# Patient Record
Sex: Male | Born: 1950
Health system: Southern US, Community
[De-identification: ages and names within clinical notes are randomized; demographics above are authoritative.]

## PROBLEM LIST (undated history)

## (undated) DIAGNOSIS — R51 Headache: Secondary | ICD-10-CM

## (undated) DIAGNOSIS — I2510 Atherosclerotic heart disease of native coronary artery without angina pectoris: Secondary | ICD-10-CM

## (undated) DIAGNOSIS — F1010 Alcohol abuse, uncomplicated: Secondary | ICD-10-CM

## (undated) DIAGNOSIS — I639 Cerebral infarction, unspecified: Secondary | ICD-10-CM

## (undated) DIAGNOSIS — K269 Duodenal ulcer, unspecified as acute or chronic, without hemorrhage or perforation: Secondary | ICD-10-CM

## (undated) DIAGNOSIS — B3781 Candidal esophagitis: Secondary | ICD-10-CM

## (undated) DIAGNOSIS — C61 Malignant neoplasm of prostate: Secondary | ICD-10-CM

## (undated) DIAGNOSIS — E43 Unspecified severe protein-calorie malnutrition: Secondary | ICD-10-CM

## (undated) DIAGNOSIS — I429 Cardiomyopathy, unspecified: Secondary | ICD-10-CM

## (undated) DIAGNOSIS — I10 Essential (primary) hypertension: Secondary | ICD-10-CM

## (undated) DIAGNOSIS — I509 Heart failure, unspecified: Secondary | ICD-10-CM

## (undated) HISTORY — DX: Heart failure, unspecified: I50.9

## (undated) HISTORY — DX: Cerebral infarction, unspecified: I63.9

## (undated) HISTORY — DX: Unspecified severe protein-calorie malnutrition: E43

## (undated) HISTORY — DX: Alcohol abuse, uncomplicated: F10.10

## (undated) HISTORY — DX: Duodenal ulcer, unspecified as acute or chronic, without hemorrhage or perforation: K26.9

## (undated) HISTORY — DX: Atherosclerotic heart disease of native coronary artery without angina pectoris: I25.10

## (undated) HISTORY — PX: LOOP RECORDER IMPLANT: SHX5954

## (undated) HISTORY — DX: Cardiomyopathy, unspecified: I42.9

## (undated) HISTORY — DX: Candidal esophagitis: B37.81

---
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
## 2003-09-17 ENCOUNTER — Emergency Department (HOSPITAL_COMMUNITY): Admission: EM | Admit: 2003-09-17 | Discharge: 2003-09-17 | Payer: Self-pay | Admitting: Emergency Medicine

## 2006-04-08 ENCOUNTER — Emergency Department (HOSPITAL_COMMUNITY): Admission: EM | Admit: 2006-04-08 | Discharge: 2006-04-08 | Payer: Self-pay | Admitting: Emergency Medicine

## 2007-04-09 ENCOUNTER — Emergency Department (HOSPITAL_COMMUNITY): Admission: EM | Admit: 2007-04-09 | Discharge: 2007-04-09 | Payer: Self-pay | Admitting: Emergency Medicine

## 2009-08-04 ENCOUNTER — Encounter: Admission: RE | Admit: 2009-08-04 | Discharge: 2009-08-04 | Payer: Self-pay | Admitting: Family Medicine

## 2009-08-07 ENCOUNTER — Ambulatory Visit (HOSPITAL_COMMUNITY): Admission: RE | Admit: 2009-08-07 | Discharge: 2009-08-07 | Payer: Self-pay | Admitting: Internal Medicine

## 2010-06-16 ENCOUNTER — Encounter: Payer: Self-pay | Admitting: Gastroenterology

## 2011-03-05 ENCOUNTER — Other Ambulatory Visit: Payer: Self-pay | Admitting: Otolaryngology

## 2011-03-11 ENCOUNTER — Ambulatory Visit
Admission: RE | Admit: 2011-03-11 | Discharge: 2011-03-11 | Disposition: A | Discharge status: Home / Self Care | Payer: No Typology Code available for payment source | Source: Ambulatory Visit | Attending: Otolaryngology | Admitting: Otolaryngology

## 2011-04-29 ENCOUNTER — Ambulatory Visit (HOSPITAL_COMMUNITY)
Admission: RE | Admit: 2011-04-29 | Discharge: 2011-04-29 | Disposition: A | Discharge status: Home / Self Care | Payer: Self-pay | Source: Ambulatory Visit | Attending: Gastroenterology | Admitting: Gastroenterology

## 2011-04-29 ENCOUNTER — Encounter (HOSPITAL_COMMUNITY)
Admission: RE | Disposition: A | Discharge status: Home / Self Care | Payer: Self-pay | Source: Ambulatory Visit | Attending: Gastroenterology

## 2011-04-29 ENCOUNTER — Encounter (HOSPITAL_COMMUNITY): Payer: Self-pay | Admitting: *Deleted

## 2011-04-29 DIAGNOSIS — K224 Dyskinesia of esophagus: Secondary | ICD-10-CM | POA: Insufficient documentation

## 2011-04-29 DIAGNOSIS — K222 Esophageal obstruction: Secondary | ICD-10-CM | POA: Insufficient documentation

## 2011-04-29 DIAGNOSIS — K2210 Ulcer of esophagus without bleeding: Secondary | ICD-10-CM | POA: Insufficient documentation

## 2011-04-29 DIAGNOSIS — R1310 Dysphagia, unspecified: Secondary | ICD-10-CM | POA: Insufficient documentation

## 2011-04-29 HISTORY — PX: ESOPHAGOGASTRODUODENOSCOPY: SHX5428

## 2011-04-29 HISTORY — DX: Essential (primary) hypertension: I10

## 2011-04-29 SURGERY — EGD (ESOPHAGOGASTRODUODENOSCOPY)
Anesthesia: Moderate Sedation

## 2011-04-29 MED ORDER — MIDAZOLAM HCL 10 MG/2ML IJ SOLN
INTRAMUSCULAR | Status: AC
  Filled 2011-04-29: qty 2

## 2011-04-29 MED ORDER — BUTAMBEN-TETRACAINE-BENZOCAINE 2-2-14 % EX AERO
INHALATION_SPRAY | CUTANEOUS | Status: DC | PRN
  Administered 2011-04-29: 2 via TOPICAL

## 2011-04-29 MED ORDER — MIDAZOLAM HCL 10 MG/2ML IJ SOLN
INTRAMUSCULAR | Status: DC | PRN
  Administered 2011-04-29: 2 mg via INTRAVENOUS
  Administered 2011-04-29: 1 mg via INTRAVENOUS
  Administered 2011-04-29: 2 mg via INTRAVENOUS
  Administered 2011-04-29: 1 mg via INTRAVENOUS

## 2011-04-29 MED ORDER — SODIUM CHLORIDE 0.9 % IV SOLN
Freq: Once | INTRAVENOUS | Status: AC
  Administered 2011-04-29: 500 mL via INTRAVENOUS

## 2011-04-29 MED ORDER — FENTANYL NICU IV SYRINGE 50 MCG/ML
INJECTION | INTRAMUSCULAR | Status: DC | PRN
  Administered 2011-04-29 (×3): 25 ug via INTRAVENOUS

## 2011-04-29 MED ORDER — FENTANYL CITRATE 0.05 MG/ML IJ SOLN
INTRAMUSCULAR | Status: AC
  Filled 2011-04-29: qty 2

## 2011-04-29 NOTE — H&P (Signed)
H+P from office reviewed no changes.

## 2011-04-30 ENCOUNTER — Other Ambulatory Visit: Payer: Self-pay | Admitting: Gastroenterology

## 2011-05-01 ENCOUNTER — Ambulatory Visit
Admission: RE | Admit: 2011-05-01 | Discharge: 2011-05-01 | Disposition: A | Discharge status: Home / Self Care | Payer: No Typology Code available for payment source | Source: Ambulatory Visit | Attending: Gastroenterology | Admitting: Gastroenterology

## 2011-05-01 ENCOUNTER — Encounter (HOSPITAL_COMMUNITY): Payer: Self-pay | Admitting: Gastroenterology

## 2011-05-01 MED ORDER — IOHEXOL 300 MG/ML  SOLN
75.0000 mL | Freq: Once | INTRAMUSCULAR | Status: AC | PRN
  Administered 2011-05-01: 75 mL via INTRAVENOUS

## 2011-05-06 ENCOUNTER — Encounter (HOSPITAL_COMMUNITY): Payer: Self-pay

## 2011-05-06 NOTE — H&P (Signed)
PREOPERATIVE H&P  Chief Complaint: trouble swallowing  HPI: Gregory Perry is a 60 y.o. male who presents for evaluation of trouble swallowing for 6 months. He's has approximately 25 lb. Weight loss. He underwent endoscopy with Dr Randa Evens who noted an upper esophageal mass and was unable to pass the endoscope. Subsequent CT scan also demonstrated an upper esophageal mass just below the UES. He's taken to the OR at this time for rigid esophagoscopy and bx. Smokes 1/2 ppd.  Past Medical History  Diagnosis Date  . Hypertension   . Dysphagia   . Headache     history of   Past Surgical History  Procedure Date  . No past surgeries   . Esophagogastroduodenoscopy 04/29/2011    Procedure: ESOPHAGOGASTRODUODENOSCOPY (EGD);  Surgeon: Vertell Novak., MD;  Location: [facility];  Service: Endoscopy;  Laterality: N/A;    History reviewed. No pertinent family history. No Known Allergies Prior to Admission medications   Not on File     Positive ROS: trouble swalloing  All other systems have been reviewed and were otherwise negative with the exception of those mentioned in the HPI and as above.  Physical Exam: There were no vitals filed for this visit.  General: Alert, no acute distress Oral: Normal oral mucosa and tonsils Nasal: Clear nasal passages Neck: No palpable adenopathy or thyroid nodules Ear: Ear canal is clear with normal appearing TMs Cardiovascular: Regular rate and rhythm, no murmur.  Respiratory: Clear to auscultation Neurologic: Alert and oriented x 3   Assessment/Plan: ESOPHAGEAL MASS Plan for Procedure(s): DIRECT LARYNGOSCOPY, DIRECT ESOPHAGOSCOPY AND BX   NEWMAN, CHRISTOPHER E, MD 05/06/2011 8:46 PM

## 2011-05-07 ENCOUNTER — Ambulatory Visit (HOSPITAL_COMMUNITY): Payer: Self-pay

## 2011-05-07 ENCOUNTER — Ambulatory Visit (HOSPITAL_COMMUNITY)
Admission: RE | Admit: 2011-05-07 | Discharge: 2011-05-07 | Disposition: A | Discharge status: Home / Self Care | Payer: Self-pay | Source: Ambulatory Visit | Attending: Otolaryngology | Admitting: Otolaryngology

## 2011-05-07 ENCOUNTER — Encounter (HOSPITAL_COMMUNITY)
Admission: RE | Disposition: A | Discharge status: Home / Self Care | Payer: Self-pay | Source: Ambulatory Visit | Attending: Otolaryngology

## 2011-05-07 ENCOUNTER — Other Ambulatory Visit: Payer: Self-pay | Admitting: Otolaryngology

## 2011-05-07 ENCOUNTER — Ambulatory Visit (HOSPITAL_COMMUNITY): Payer: Self-pay | Admitting: Anesthesiology

## 2011-05-07 ENCOUNTER — Encounter (HOSPITAL_COMMUNITY): Payer: Self-pay | Admitting: Anesthesiology

## 2011-05-07 ENCOUNTER — Other Ambulatory Visit: Payer: Self-pay

## 2011-05-07 ENCOUNTER — Encounter (HOSPITAL_COMMUNITY): Payer: Self-pay | Admitting: *Deleted

## 2011-05-07 DIAGNOSIS — R634 Abnormal weight loss: Secondary | ICD-10-CM | POA: Insufficient documentation

## 2011-05-07 DIAGNOSIS — R1319 Other dysphagia: Secondary | ICD-10-CM | POA: Insufficient documentation

## 2011-05-07 DIAGNOSIS — R4702 Dysphasia: Secondary | ICD-10-CM | POA: Diagnosis present

## 2011-05-07 DIAGNOSIS — I10 Essential (primary) hypertension: Secondary | ICD-10-CM | POA: Insufficient documentation

## 2011-05-07 DIAGNOSIS — K222 Esophageal obstruction: Secondary | ICD-10-CM | POA: Insufficient documentation

## 2011-05-07 HISTORY — PX: DIRECT LARYNGOSCOPY: SHX5326

## 2011-05-07 HISTORY — DX: Headache: R51

## 2011-05-07 LAB — BASIC METABOLIC PANEL
BUN: 8 mg/dL (ref 6–23)
CO2: 25 mEq/L (ref 19–32)
Chloride: 103 mEq/L (ref 96–112)
GFR calc non Af Amer: >90 mL/min (ref >90)
Glucose, Bld: 72 mg/dL (ref 70–99)
Potassium: 3.7 mEq/L (ref 3.5–5.1)
Sodium: 140 mEq/L (ref 135–145)

## 2011-05-07 LAB — CBC
HCT: 41.6 % (ref 39.0–52.0)
Hemoglobin: 14.4 g/dL (ref 13.0–17.0)
MCH: 31.3 pg (ref 26.0–34.0)
MCHC: 34.6 g/dL (ref 30.0–36.0)
MCV: 90.4 fL (ref 78.0–100.0)
RBC: 4.6 MIL/uL (ref 4.22–5.81)

## 2011-05-07 SURGERY — LARYNGOSCOPY, DIRECT
Anesthesia: General | Site: Mouth | Wound class: Clean Contaminated

## 2011-05-07 MED ORDER — ROCURONIUM BROMIDE 100 MG/10ML IV SOLN
INTRAVENOUS | Status: DC | PRN
  Administered 2011-05-07: 35 mg via INTRAVENOUS

## 2011-05-07 MED ORDER — CEFAZOLIN SODIUM 1-5 GM-% IV SOLN
INTRAVENOUS | Status: AC
  Filled 2011-05-07: qty 100

## 2011-05-07 MED ORDER — GLYCOPYRROLATE 0.2 MG/ML IJ SOLN
INTRAMUSCULAR | Status: DC | PRN
  Administered 2011-05-07: 0.1 mg via INTRAVENOUS
  Administered 2011-05-07: .7 mg via INTRAVENOUS

## 2011-05-07 MED ORDER — ONDANSETRON HCL 4 MG/2ML IJ SOLN: 4.0000 mg | Freq: Four times a day (QID) | INTRAMUSCULAR | Status: DC | PRN

## 2011-05-07 MED ORDER — LACTATED RINGERS IV SOLN
INTRAVENOUS | Status: DC | PRN
  Administered 2011-05-07: 12:00:00 via INTRAVENOUS

## 2011-05-07 MED ORDER — AMOXICILLIN 400 MG/5ML PO SUSR: 400.0000 mg | Freq: Three times a day (TID) | ORAL | Status: AC

## 2011-05-07 MED ORDER — FENTANYL CITRATE 0.05 MG/ML IJ SOLN
INTRAMUSCULAR | Status: DC | PRN
  Administered 2011-05-07: 100 ug via INTRAVENOUS
  Administered 2011-05-07: 50 ug via INTRAVENOUS

## 2011-05-07 MED ORDER — CEFAZOLIN SODIUM 1-5 GM-% IV SOLN
INTRAVENOUS | Status: DC | PRN
  Administered 2011-05-07: 2 g via INTRAVENOUS

## 2011-05-07 MED ORDER — PROPOFOL 10 MG/ML IV EMUL
INTRAVENOUS | Status: DC | PRN
  Administered 2011-05-07: 250 mg via INTRAVENOUS
  Administered 2011-05-07: 50 mg via INTRAVENOUS

## 2011-05-07 MED ORDER — SUCRALFATE 1 GM/10ML PO SUSP: 1.0000 g | Freq: Four times a day (QID) | ORAL | Status: AC

## 2011-05-07 MED ORDER — SODIUM CHLORIDE 0.9 % IV SOLN: Freq: Once | INTRAVENOUS | Status: DC

## 2011-05-07 MED ORDER — ESMOLOL HCL 10 MG/ML IV SOLN
INTRAVENOUS | Status: DC | PRN
  Administered 2011-05-07 (×2): 40 mg via INTRAVENOUS

## 2011-05-07 MED ORDER — NEOSTIGMINE METHYLSULFATE 1 MG/ML IJ SOLN
INTRAMUSCULAR | Status: DC | PRN
  Administered 2011-05-07: 4 mg via INTRAVENOUS

## 2011-05-07 MED ORDER — ONDANSETRON HCL 4 MG/2ML IJ SOLN
INTRAMUSCULAR | Status: DC | PRN
  Administered 2011-05-07: 4 mg via INTRAVENOUS

## 2011-05-07 MED ORDER — HYDROMORPHONE HCL PF 1 MG/ML IJ SOLN: 0.2500 mg | INTRAMUSCULAR | Status: DC | PRN

## 2011-05-07 SURGICAL SUPPLY — 29 items
BALLN PULM 15 16.5 18X75 (BALLOONS)
BALLOON PULM 15 16.5 18X75 (BALLOONS) ×1 IMPLANT
CANISTER SUCTION 2500CC (MISCELLANEOUS) ×3 IMPLANT
CLOTH BEACON ORANGE TIMEOUT ST (SAFETY) ×2 IMPLANT
CONT SPEC 4OZ CLIKSEAL STRL BL (MISCELLANEOUS) ×2 IMPLANT
COVER TABLE BACK 60X90 (DRAPES) ×2 IMPLANT
DRAPE PROXIMA HALF (DRAPES) ×2 IMPLANT
GAUZE SPONGE 4X4 16PLY XRAY LF (GAUZE/BANDAGES/DRESSINGS) ×1 IMPLANT
GLOVE BIO SURGEON STRL SZ7.5 (GLOVE) ×1 IMPLANT
GLOVE SS BIOGEL STRL SZ 7.5 (GLOVE) ×1 IMPLANT
GLOVE SUPERSENSE BIOGEL SZ 7.5 (GLOVE) ×1
GLOVE SURG SS PI 6.5 STRL IVOR (GLOVE) ×1 IMPLANT
GUARD TEETH (MISCELLANEOUS) IMPLANT
KIT ROOM TURNOVER OR (KITS) ×2 IMPLANT
MARKER SKIN DUAL TIP RULER LAB (MISCELLANEOUS) IMPLANT
NS IRRIG 1000ML POUR BTL (IV SOLUTION) ×1 IMPLANT
PAD ARMBOARD 7.5X6 YLW CONV (MISCELLANEOUS) ×4 IMPLANT
PAD EYE OVAL STERILE LF (GAUZE/BANDAGES/DRESSINGS) ×8 IMPLANT
PATTIES SURGICAL .5 X3 (DISPOSABLE) IMPLANT
SOLUTION ANTI FOG 6CC (MISCELLANEOUS) ×1 IMPLANT
SPECIMEN JAR SMALL (MISCELLANEOUS) ×1 IMPLANT
SPONGE GAUZE 4X4 12PLY (GAUZE/BANDAGES/DRESSINGS) ×2 IMPLANT
SPONGE INTESTINAL PEANUT (DISPOSABLE) IMPLANT
SURGILUBE 2OZ TUBE FLIPTOP (MISCELLANEOUS) ×2 IMPLANT
SYR INFLATE BILIARY GAUGE (MISCELLANEOUS) ×1 IMPLANT
TOWEL OR 17X24 6PK STRL BLUE (TOWEL DISPOSABLE) ×5 IMPLANT
TRAP SPECIMEN MUCOUS 40CC (MISCELLANEOUS) IMPLANT
TUBE CONNECTING 12X1/4 (SUCTIONS) ×3 IMPLANT
WATER STERILE IRR 1000ML POUR (IV SOLUTION) ×1 IMPLANT

## 2011-05-07 NOTE — Op Note (Signed)
[name] [address] [place], [place]  [address]  ENDOSCOPY PROCEDURE REPORT  PATIENT:  Gregory, Perry  MR#:  [id number] BIRTHDATE:  08/04/1950, 60 yrs. old  GENDER:  male  ENDOSCOPIST:  Carman Ching Referred by:  Dillard Cannon, M.D.  PROCEDURE DATE:  05/07/2011 PROCEDURE:  EGD with biopsy, 43239, EGD with Savory dilation over a guidewire ASA CLASS: INDICATIONS:  patient with dysphagia and previous EGD showed prox esophageal stricture and CT patlulous proximal esophagus.  Pt for endoscopic and ENT evaluation under anesthesia and dilatation  MEDICATIONS:  General anesthesia  TOPICAL ANESTHETIC:  DESCRIPTION OF PROCEDURE:   After the risks benefits and alternatives of the procedure were thoroughly explained, informed consent was obtained.  The  endoscope was introduced through the mouth and advanced to the second portion of the duodenum, without limitations.  The instrument was slowly withdrawn as the mucosa was fully examined. There was proximal stricture ofthe esophagus about 2-3 cm below the UES and was unable to pass with the peds scope.  Savory wire was placed and dilated with 8,10, and 12 mm savory dilators. Complete EGD then performed. Some endoscopic biopsies were taken with peds forceps and Dr Ezzard Standing used rigid scope and obtained  larger biopsies. <<PROCEDUREIMAGES>> FINDINGS: Proximal Esophageal Stricture.  Biopsied and dilated to 12 mm.  ? benign vs malignan process.  COMPLICATIONS: None immediate  ENDOSCOPIC IMPRESSION: ? Benign Stricture of unclear cause.  RECOMMENDATIONS: Discharge oncarafate slurry and antibiotics.  F/U with DRs Randa Evens and [facility].  ? further dilatations depending  on path results.  ______________________________ Carman Ching  CC:  Dillard Cannon MD  n. Rosalie DoctorCarman Ching at 05/07/2011 02:10 PM  [name], [phone]

## 2011-05-07 NOTE — Preoperative (Signed)
Beta Blockers   Reason not to administer Beta Blockers:Not Applicable 

## 2011-05-07 NOTE — Anesthesia Procedure Notes (Signed)
Procedure Name: Intubation Date/Time: 05/07/2011 12:38 PM Performed by: Fuller Canada Pre-anesthesia Checklist: Patient identified, Timeout performed, Emergency Drugs available, Suction available and Patient being monitored Patient Re-evaluated:Patient Re-evaluated prior to inductionOxygen Delivery Method: Circle System Utilized Preoxygenation: Pre-oxygenation with 100% oxygen Intubation Type: IV induction Ventilation: Mask ventilation without difficulty Laryngoscope Size: Mac and 3 Grade View: Grade I Tube type: Oral Tube size: 6.5 mm Number of attempts: 1 Airway Equipment and Method: stylet Placement Confirmation: ETT inserted through vocal cords under direct vision,  positive ETCO2 and breath sounds checked- equal and bilateral Secured at: 22 cm Tube secured with: Tape Dental Injury: Teeth and Oropharynx as per pre-operative assessment

## 2011-05-07 NOTE — Anesthesia Postprocedure Evaluation (Signed)
Anesthesia Post Note  Patient: Gregory Perry  Procedure(s) Performed:  DIRECT LARYNGOSCOPY - ESOPHAGOSCOPY  WITH DILATION; ESOPHAGOGASTRODUODENOSCOPY (EGD) WITH ESOPHAGEAL DILATION  Anesthesia type: General  Patient location: PACU  Post pain: Pain level controlled and Adequate analgesia  Post assessment: Post-op Vital signs reviewed, Patient's Cardiovascular Status Stable, Respiratory Function Stable, Patent Airway and Pain level controlled  Last Vitals:  Filed Vitals:   05/07/11 1453  BP:   Pulse: 82  Temp: 36.6 C  Resp: 13    Post vital signs: Reviewed and stable  Level of consciousness: awake, alert  and oriented  Complications: No apparent anesthesia complications

## 2011-05-07 NOTE — Transfer of Care (Signed)
Immediate Anesthesia Transfer of Care Note  Patient: Gregory Perry  Procedure(s) Performed:  DIRECT LARYNGOSCOPY - ESOPHAGOSCOPY  WITH DILATION; ESOPHAGOGASTRODUODENOSCOPY (EGD) WITH ESOPHAGEAL DILATION  Patient Location: PACU  Anesthesia Type: General  Level of Consciousness: awake, alert  and oriented  Airway & Oxygen Therapy: Patient Spontanous Breathing and Patient connected to face mask oxygen  Post-op Assessment: Report given to PACU RN and Post -op Vital signs reviewed and stable  Post vital signs: Reviewed and stable  Complications: No apparent anesthesia complications

## 2011-05-07 NOTE — Brief Op Note (Signed)
05/07/2011  1:23 PM  PATIENT:  Gregory Perry  60 y.o. male  PRE-OPERATIVE DIAGNOSIS:  dysphagia  POST-OPERATIVE DIAGNOSIS:  dysphagia  PROCEDURE:  Procedure(s): DIRECT LARYNGOSCOPY ESOPHAGOGASTRODUODENOSCOPY (EGD) WITH ESOPHAGEAL DILATION  SURGEON:  Surgeon(s): Drema Halon, MD Vertell Novak., MD  PHYSICIAN ASSISTANT:   ASSISTANTS: none   ANESTHESIA:   general  EBL:     BLOOD ADMINISTERED:none  DRAINS: none   LOCAL MEDICATIONS USED:  NONE  SPECIMEN:  Source of Specimen:  upper esophagus 30cm  DISPOSITION OF SPECIMEN:  PATHOLOGY  COUNTS:  YES  TOURNIQUET:  * No tourniquets in log *  DICTATION: .Other Dictation: Dictation Number [id number]  PLAN OF CARE: Discharge to home after PACU  PATIENT DISPOSITION:  PACU - hemodynamically stable.   Delay start of Pharmacological VTE agent (>24hrs) due to surgical blood loss or risk of bleeding:  {YES/NO/NOT APPLICABLE:20182

## 2011-05-07 NOTE — Interval H&P Note (Signed)
History and Physical Interval Note:  05/07/2011 12:09 PM  Gregory Perry  has presented today for surgery, with the diagnosis of ESOPHAGEAL MASS  The various methods of treatment have been discussed with the patient and family. After consideration of risks, benefits and other options for treatment, the patient has consented to  Procedure(s): DIRECT LARYNGOSCOPY as a surgical intervention .  The patients' history has been reviewed, patient examined, no change in status, stable for surgery.  I have reviewed the patients' chart and labs.  Questions were answered to the patient's satisfaction.     [name] E

## 2011-05-07 NOTE — Anesthesia Preprocedure Evaluation (Addendum)
Anesthesia Evaluation  Patient identified by MRN, date of birth, ID band Patient awake    Reviewed: Allergy & Precautions, H&P , NPO status , Patient's Chart, lab work & pertinent test results  Airway Mallampati: II  Neck ROM: full    Dental   Pulmonary          Cardiovascular hypertension,     Neuro/Psych  Headaches,    GI/Hepatic   Endo/Other    Renal/GU      Musculoskeletal   Abdominal   Peds  Hematology   Anesthesia Other Findings   Reproductive/Obstetrics                          Anesthesia Physical Anesthesia Plan  ASA: II  Anesthesia Plan: General   Post-op Pain Management:    Induction: Intravenous  Airway Management Planned: Oral ETT  Additional Equipment:   Intra-op Plan:   Post-operative Plan: Extubation in OR  Informed Consent: I have reviewed the patients History and Physical, chart, labs and discussed the procedure including the risks, benefits and alternatives for the proposed anesthesia with the patient or authorized representative who has indicated his/her understanding and acceptance.     Plan Discussed with: CRNA, Surgeon and Anesthesiologist  Anesthesia Plan Comments:        Anesthesia Quick Evaluation

## 2011-05-07 NOTE — Op Note (Signed)
NAMETYMAR, POLYAK NO.:  [id number]  MEDICAL RECORD NO.:  [id number]  LOCATION:  [facility]                         FACILITY:  [facility]  PHYSICIAN:  Kristine Garbe. Ezzard Standing, M.D.DATE OF BIRTH:  01-13-1951  DATE OF PROCEDURE: DATE OF DISCHARGE:  05/07/2011                              OPERATIVE REPORT   PREOPERATIVE DIAGNOSIS:  Dysphagia with upper esophageal stricture.  POSTOPERATIVE DIAGNOSIS:  Dysphagia with upper esophageal stricture.  OPERATION:  Direct laryngoscopy, rigid esophagoscopy with dilation using the Savary dilators to number 12, biopsy of upper esophageal stricture region.  SURGEONS:  Kristine Garbe. Ezzard Standing, MD and Dr. Llana Aliment. Randa Evens, MD  ANESTHESIA:  General endotracheal.  COMPLICATIONS:  None.  BRIEF CLINICAL NOTE:  Jerret Mcbane is a 60 year old gentleman who has had problems with dysphagia now for about 6 months with a 25-pound weight loss.  He has had a CT scan and upper endoscopy, which showed a stricture with wall area questionable tumor.  He was taken to the operating room at this time for esophagoscopy with biopsy.  DESCRIPTION OF PROCEDURE:  After adequate endotracheal anesthesia, direct laryngoscopy was performed.  Both piriform sinuses and upper hypopharynx was all clear.  Larynx was clear.  The cervical esophagus was then used and the cervical esophagus was passed in the upper esophageal sphincter without any problem.  There was no abnormality noted in the mucosa but approximately 2-3 cm below the upper esophageal sphincter, there was a very rigid, tight area and some wall mucosa, which was bleeding a little bit.  Next, the guidewire was passed and dilation starting with 8 Savary dilator was dilated up to number 12 without any difficulty.  Then, Dr. Randa Evens passed the endoscope down to the esophagus down all the way into the stomach.  The remaining of more distal esophagus was clear.  Stomach was likewise clear of any  mucosal abnormalities.  Several biopsies were obtained around the strictured area.  This completed the procedure.  The patient was subsequently awoke from anesthesia and transferred recovery room postop doing well.  He received 2 g of Ancef postoperatively.  He will be discharged home on amoxicillin suspension and will follow up in 1 week for recheck and review of pathology.          ______________________________ Kristine Garbe Ezzard Standing, M.D.     CEN/MEDQ  D:  05/07/2011  T:  05/07/2011  Job:  [id number]  cc:   Fayrene Fearing L. Malon Kindle., M.D.

## 2011-05-10 ENCOUNTER — Encounter (HOSPITAL_COMMUNITY): Payer: Self-pay | Admitting: Otolaryngology

## 2012-09-11 IMAGING — CR DG CHEST 2V
2 series · 2 of 2 positions shown · non-contrast
Comparison: Chest x-ray 08/04/2009.

CLINICAL DATA: Esophageal mass.  Hypertension.

CHEST - 2 VIEW

[view not recorded (1 of 2)]
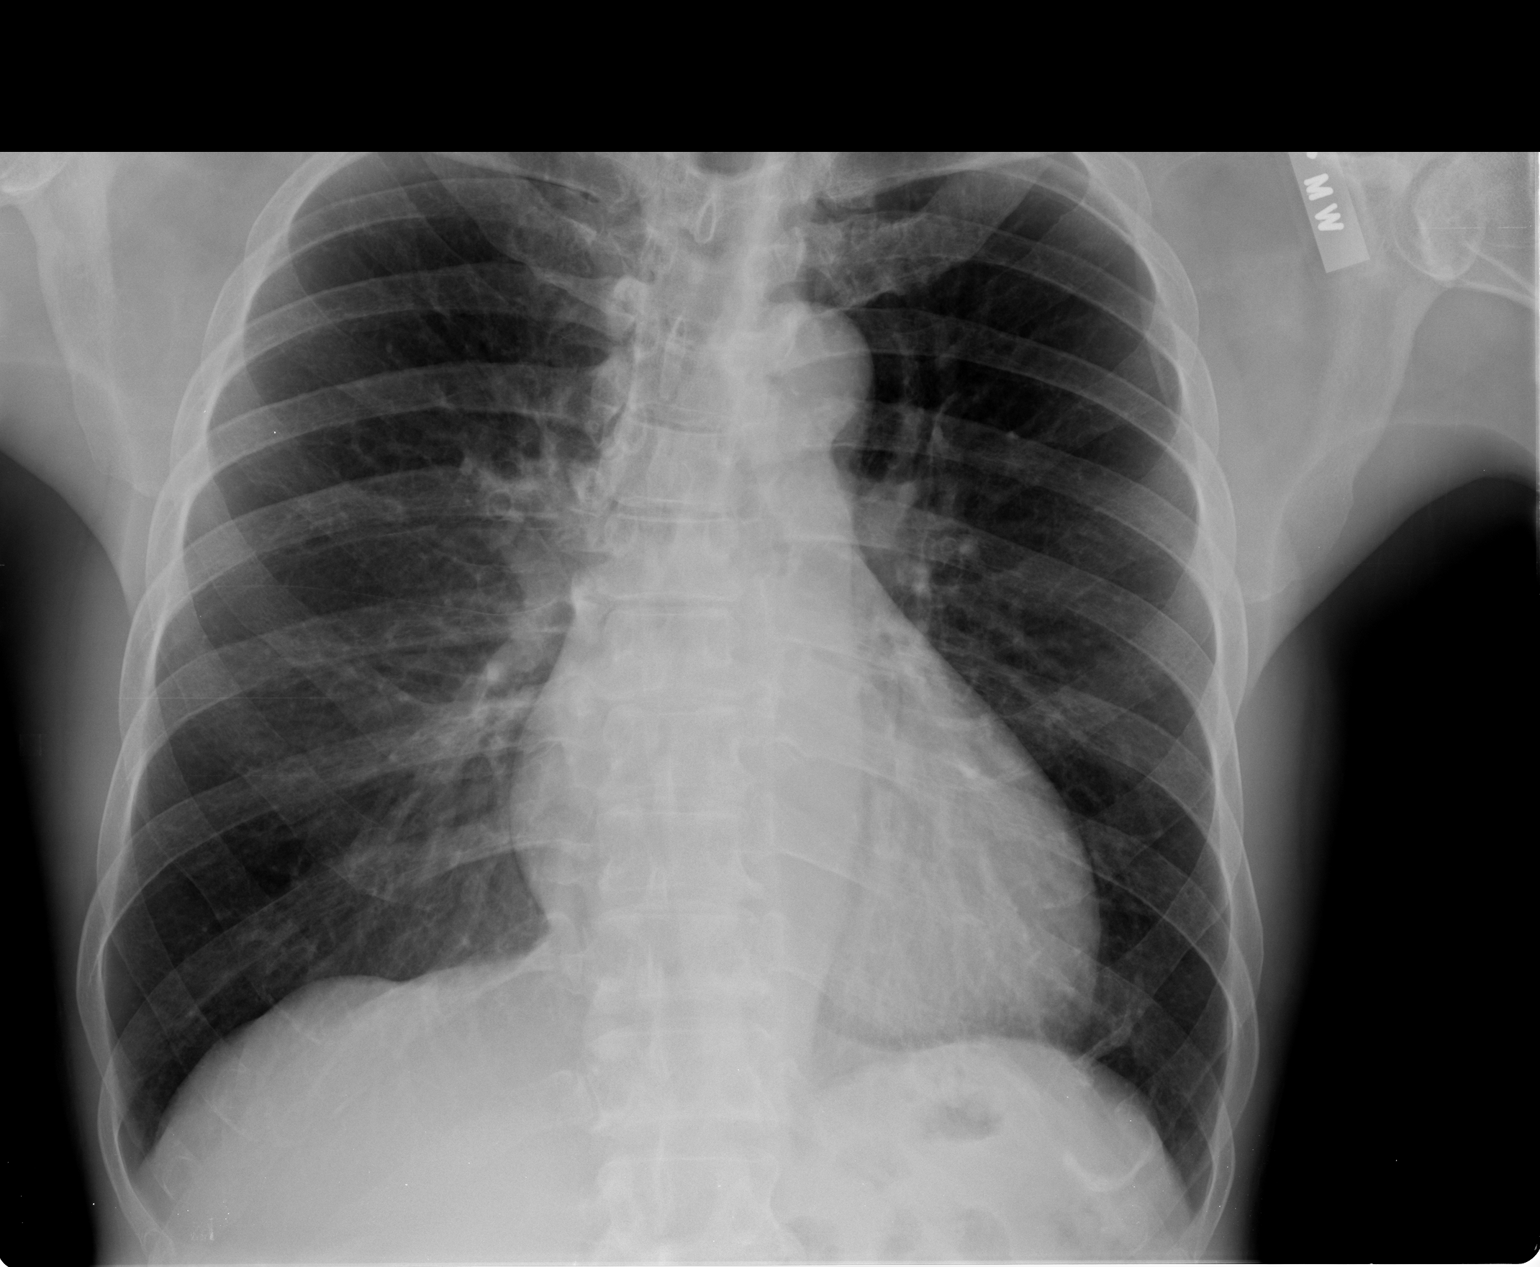

[view not recorded (2 of 2)]
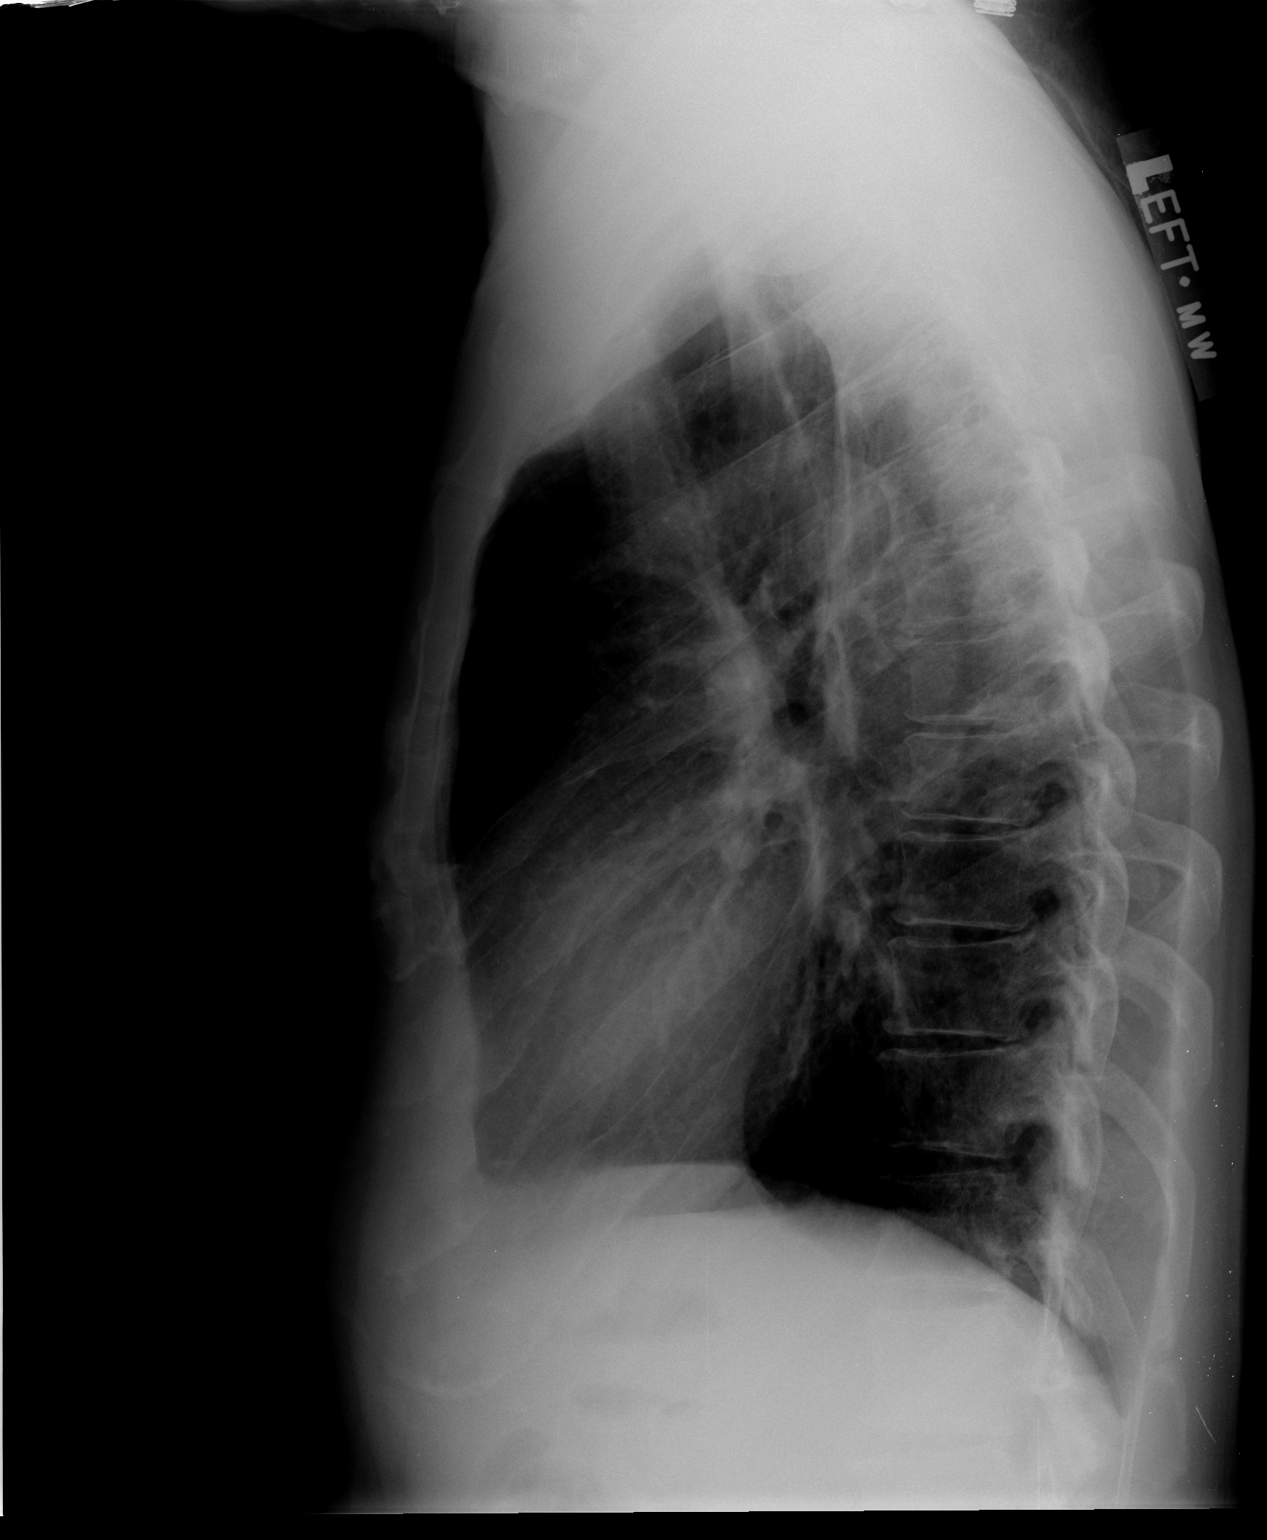

[2 of 2 positions shown; findings below may reference images not displayed]

FINDINGS: The cardiac silhouette, mediastinal and hilar contours
are stable.  The lungs are clear.  No pleural effusion.  The bony
thorax is intact.
IMPRESSION: No acute cardiopulmonary findings.  No change since prior study.

## 2015-11-16 ENCOUNTER — Encounter (HOSPITAL_COMMUNITY): Payer: Self-pay | Admitting: Nurse Practitioner

## 2015-11-16 ENCOUNTER — Inpatient Hospital Stay (HOSPITAL_COMMUNITY)
Admission: EM | Admit: 2015-11-16 | Discharge: 2015-11-23 | DRG: 040 | Disposition: A | Discharge status: Home / Self Care | Payer: Self-pay | Attending: Internal Medicine | Admitting: Internal Medicine

## 2015-11-16 ENCOUNTER — Other Ambulatory Visit: Payer: Self-pay

## 2015-11-16 DIAGNOSIS — I63331 Cerebral infarction due to thrombosis of right posterior cerebral artery: Secondary | ICD-10-CM | POA: Insufficient documentation

## 2015-11-16 DIAGNOSIS — Z01810 Encounter for preprocedural cardiovascular examination: Secondary | ICD-10-CM

## 2015-11-16 DIAGNOSIS — E785 Hyperlipidemia, unspecified: Secondary | ICD-10-CM | POA: Diagnosis present

## 2015-11-16 DIAGNOSIS — R1310 Dysphagia, unspecified: Secondary | ICD-10-CM

## 2015-11-16 DIAGNOSIS — I255 Ischemic cardiomyopathy: Secondary | ICD-10-CM | POA: Diagnosis present

## 2015-11-16 DIAGNOSIS — R634 Abnormal weight loss: Secondary | ICD-10-CM | POA: Diagnosis present

## 2015-11-16 DIAGNOSIS — K259 Gastric ulcer, unspecified as acute or chronic, without hemorrhage or perforation: Secondary | ICD-10-CM | POA: Diagnosis present

## 2015-11-16 DIAGNOSIS — W010XXA Fall on same level from slipping, tripping and stumbling without subsequent striking against object, initial encounter: Secondary | ICD-10-CM | POA: Diagnosis present

## 2015-11-16 DIAGNOSIS — K222 Esophageal obstruction: Secondary | ICD-10-CM | POA: Diagnosis present

## 2015-11-16 DIAGNOSIS — Y92009 Unspecified place in unspecified non-institutional (private) residence as the place of occurrence of the external cause: Secondary | ICD-10-CM

## 2015-11-16 DIAGNOSIS — K269 Duodenal ulcer, unspecified as acute or chronic, without hemorrhage or perforation: Secondary | ICD-10-CM | POA: Diagnosis present

## 2015-11-16 DIAGNOSIS — K219 Gastro-esophageal reflux disease without esophagitis: Secondary | ICD-10-CM | POA: Diagnosis present

## 2015-11-16 DIAGNOSIS — Z9114 Patient's other noncompliance with medication regimen: Secondary | ICD-10-CM

## 2015-11-16 DIAGNOSIS — Z720 Tobacco use: Secondary | ICD-10-CM | POA: Diagnosis present

## 2015-11-16 DIAGNOSIS — E876 Hypokalemia: Secondary | ICD-10-CM | POA: Diagnosis present

## 2015-11-16 DIAGNOSIS — I5189 Other ill-defined heart diseases: Secondary | ICD-10-CM

## 2015-11-16 DIAGNOSIS — R931 Abnormal findings on diagnostic imaging of heart and coronary circulation: Secondary | ICD-10-CM

## 2015-11-16 DIAGNOSIS — E43 Unspecified severe protein-calorie malnutrition: Secondary | ICD-10-CM | POA: Diagnosis present

## 2015-11-16 DIAGNOSIS — Z8673 Personal history of transient ischemic attack (TIA), and cerebral infarction without residual deficits: Secondary | ICD-10-CM

## 2015-11-16 DIAGNOSIS — R4702 Dysphasia: Secondary | ICD-10-CM

## 2015-11-16 DIAGNOSIS — I10 Essential (primary) hypertension: Secondary | ICD-10-CM | POA: Diagnosis present

## 2015-11-16 DIAGNOSIS — H53461 Homonymous bilateral field defects, right side: Secondary | ICD-10-CM | POA: Diagnosis present

## 2015-11-16 DIAGNOSIS — R000 Tachycardia, unspecified: Secondary | ICD-10-CM | POA: Diagnosis not present

## 2015-11-16 DIAGNOSIS — I63432 Cerebral infarction due to embolism of left posterior cerebral artery: Principal | ICD-10-CM | POA: Diagnosis present

## 2015-11-16 DIAGNOSIS — F17210 Nicotine dependence, cigarettes, uncomplicated: Secondary | ICD-10-CM | POA: Diagnosis present

## 2015-11-16 DIAGNOSIS — I639 Cerebral infarction, unspecified: Secondary | ICD-10-CM

## 2015-11-16 DIAGNOSIS — B3781 Candidal esophagitis: Secondary | ICD-10-CM | POA: Diagnosis present

## 2015-11-16 DIAGNOSIS — Z9119 Patient's noncompliance with other medical treatment and regimen: Secondary | ICD-10-CM

## 2015-11-16 DIAGNOSIS — R29703 NIHSS score 3: Secondary | ICD-10-CM | POA: Diagnosis present

## 2015-11-16 LAB — BASIC METABOLIC PANEL
Anion gap: 11 (ref 5–15)
BUN: 17 mg/dL (ref 6–20)
CHLORIDE: 107 mmol/L (ref 101–111)
CO2: 22 mmol/L (ref 22–32)
CREATININE: 0.95 mg/dL (ref 0.61–1.24)
Calcium: 9.9 mg/dL (ref 8.9–10.3)
GFR calc non Af Amer: >60 mL/min (ref >60)
Glucose, Bld: 106 mg/dL — ABNORMAL HIGH (ref 65–99)
POTASSIUM: 3.7 mmol/L (ref 3.5–5.1)
SODIUM: 140 mmol/L (ref 135–145)

## 2015-11-16 LAB — CBC
HEMATOCRIT: 43.9 % (ref 39.0–52.0)
HEMOGLOBIN: 15.3 g/dL (ref 13.0–17.0)
MCH: 30.8 pg (ref 26.0–34.0)
MCHC: 34.9 g/dL (ref 30.0–36.0)
MCV: 88.5 fL (ref 78.0–100.0)
PLATELETS: 267 10*3/uL (ref 150–400)
RBC: 4.96 MIL/uL (ref 4.22–5.81)
RDW: 14.4 % (ref 11.5–15.5)
WBC: 9.5 10*3/uL (ref 4.0–10.5)

## 2015-11-16 LAB — CBG MONITORING, ED: Glucose-Capillary: 96 mg/dL (ref 65–99)

## 2015-11-16 NOTE — ED Notes (Signed)
Pt reports being out of his antihypertensives (Lisinopril and Lasix) for the last 6 months, this afternoon her had an episode where "he found himself on the ground as he walked to the bathroom." Reports a headache, denies vision change or urinary symptoms.

## 2015-11-16 NOTE — ED Provider Notes (Signed)
CSN: [id number]     Arrival date & time 11/16/15  1947 History  By signing my name below, I, Irene Pap, attest that this documentation has been prepared under the direction and in the presence of Neelah Mannings, MD. Electronically Signed: Irene Pap, ED Scribe. 11/17/2015. 12:12 AM.   Chief Complaint  Patient presents with  . Near Syncope  . Hypertension   Patient is a 65 y.o. male presenting with near-syncope and hypertension. The history is provided by the patient. No language interpreter was used.  Near Syncope This is a new problem. The current episode started 6 to 12 hours ago. The problem occurs constantly. The problem has not changed since onset.Pertinent negatives include no chest pain. Nothing aggravates the symptoms. Nothing relieves the symptoms. He has tried nothing for the symptoms. The treatment provided no relief.  Hypertension This is a chronic problem. Pertinent negatives include no chest pain.  HPI Comments: Gregory Perry is a 65 y.o. male with a hx of HTN who presents to the Emergency Department complaining of lightheadedness onset earlier today. He reports that he was going to the bathroom when he fell due to lightheadedness secondary to his high BP. Pt states that he has not taken his Lisinopril or Lasix in 6 months because his PCP died and he has not found a new doctor. He denies fever, chills, nausea, or vomiting.   Past Medical History  Diagnosis Date  . Hypertension   . Dysphagia   . Headache(784.0)     history of   Past Surgical History  Procedure Laterality Date  . No past surgeries    . Esophagogastroduodenoscopy  04/29/2011    Procedure: ESOPHAGOGASTRODUODENOSCOPY (EGD);  Surgeon: Winfield Cunas., MD;  Location: [facility];  Service: Endoscopy;  Laterality: N/A;  . Direct laryngoscopy  05/07/2011    Procedure: DIRECT LARYNGOSCOPY;  Surgeon: Rozetta Nunnery, MD;  Location: [facility];  Service: ENT;  Laterality: N/A;  ESOPHAGOSCOPY  WITH  DILATION   History reviewed. No pertinent family history. Social History  Substance Use Topics  . Smoking status: Current Every Day Smoker -- 0.50 packs/day for 30 years    Types: Cigarettes  . Smokeless tobacco: None  . Alcohol Use: 1.8 oz/week    3 Shots of liquor per week     Comment: occassional    Review of Systems  Constitutional: Negative for fever and chills.  Cardiovascular: Positive for near-syncope. Negative for chest pain.  Gastrointestinal: Negative for nausea and vomiting.  Neurological: Positive for light-headedness.  All other systems reviewed and are negative.  Allergies  Review of patient's allergies indicates no known allergies.  Home Medications   Prior to Admission medications   Not on File   BP 178/114 mmHg  Pulse 107  Temp(Src) 98.7 F (37.1 C)  Resp 20  SpO2 100% Physical Exam  Constitutional: He is oriented to person, place, and time. He appears well-developed and well-nourished. No distress.  HENT:  Head: Normocephalic and atraumatic.  Mouth/Throat: Oropharynx is clear and moist. No oropharyngeal exudate.  Trachea midline  Eyes: Conjunctivae and EOM are normal. Pupils are equal, round, and reactive to light.  Neck: Trachea normal and normal range of motion. Neck supple. No JVD present. Carotid bruit is not present.  Cardiovascular: Normal rate and regular rhythm.  Exam reveals no gallop and no friction rub.   No murmur heard. Pulses:      Radial pulses are 2+ on the right side, and 2+ on the left side.  Dorsalis pedis pulses are 2+ on the right side, and 2+ on the left side.       Posterior tibial pulses are 2+ on the right side, and 2+ on the left side.  Pulmonary/Chest: Effort normal and breath sounds normal. No stridor. He has no wheezes. He has no rales.  Abdominal: Soft. Bowel sounds are normal. He exhibits no mass. There is no tenderness. There is no rebound and no guarding.  Musculoskeletal: Normal range of motion.   Lymphadenopathy:    He has no cervical adenopathy.  Neurological: He is alert and oriented to person, place, and time. He has normal reflexes. No cranial nerve deficit. He exhibits normal muscle tone. Coordination normal.  5/5 strength of upper and lower extremities; Cranial nerves 2-12 intact  Skin: Skin is warm and dry. He is not diaphoretic.  Psychiatric: He has a normal mood and affect. His behavior is normal.  Nursing note and vitals reviewed.   ED Course  Procedures (including critical care time) DIAGNOSTIC STUDIES: Oxygen Saturation is 100% on RA, normal by my interpretation.    COORDINATION OF CARE: 12:11 AM-Discussed treatment plan which includes labs and EKG with pt at bedside and pt agreed to plan. Sister requests work note for Saturday as she will not be attending work Friday evening to monitor the pt.   2:16 AM- transfer to [facility] for subacute infarct   Labs Review Labs Reviewed  BASIC METABOLIC PANEL - Abnormal; Notable for the following:    Glucose, Bld 106 (*)    All other components within normal limits  CBC  URINALYSIS, ROUTINE W REFLEX MICROSCOPIC (NOT AT [facility])  CBG MONITORING, ED    Imaging Review Dg Chest 2 View  11/17/2015  CLINICAL DATA:  65 year old male with hypertension EXAM: CHEST  2 VIEW COMPARISON:  Chest radiograph dated 05/07/2011 FINDINGS: The heart size and mediastinal contours are within normal limits. Both lungs are clear. The visualized skeletal structures are unremarkable. IMPRESSION: No active cardiopulmonary disease. Electronically Signed   By: Anner Crete M.D.   On: 11/17/2015 00:45   Ct Head Wo Contrast  11/17/2015  CLINICAL DATA:  Acute onset of lightheadedness. Unsteady gait. High blood pressure. Initial encounter. EXAM: CT HEAD WITHOUT CONTRAST TECHNIQUE: Contiguous axial images were obtained from the base of the skull through the vertex without intravenous contrast. COMPARISON:  CT of the neck performed 05/01/2011 FINDINGS: There is  an evolving large acute or subacute infarct involving the left occipital lobe, extending into the posterior aspect of the left temporal lobe. There is mild mass effect, without significant midline shift. No definite hemorrhagic transformation is noted, though there is slight irregularity about the left transverse sinus as it passes posterior to the infarct. Mild cerebellar atrophy is noted. Scattered periventricular and subcortical white matter change likely reflects small vessel ischemic microangiopathy. A chronic infarct is noted at the high right parietal lobe, with associated encephalomalacia. Prominence of the ventricles and sulci reflects mild cortical volume loss. The brainstem and fourth ventricle are within normal limits. There is no evidence of fracture; visualized osseous structures are unremarkable in appearance. The orbits are within normal limits. The paranasal sinuses and mastoid air cells are well-aerated. No significant soft tissue abnormalities are seen. IMPRESSION: 1. Evolving large acute or subacute infarct involving the left occipital lobe, extending into the posterior aspect of the left temporal lobe. Mild mass effect, without significant midline shift. No definite hemorrhagic transformation is seen, though there is slight irregularity of the left transverse sinus as  it passes posterior to the infarct. 2. Chronic infarct at the high right parietal lobe, with associated encephalomalacia. 3. Mild cortical volume loss and scattered small vessel ischemic microangiopathy. These results were called by telephone at the time of interpretation on 11/17/2015 at 2:05 am to Dr. Veatrice Kells, who verbally acknowledged these results. Electronically Signed   By: Garald Balding M.D.   On: 11/17/2015 02:07   I have personally reviewed and evaluated these images and lab results as part of my medical decision-making.   EKG Interpretation   Date/Time:  Thursday November 16 2015 20:31:26 EDT Ventricular Rate:   100 PR Interval:    QRS Duration: 95 QT Interval:  361 QTC Calculation: 466 R Axis:   71 Text Interpretation:  Sinus tachycardia Biatrial enlargement Left  ventricular hypertrophy Borderline T abnormalities, inferior leads  Baseline wander in lead(s) V1 V5 When compared with ECG of 05/07/2011, No  significant change was found Confirmed by [facility]  MD, DAVID ([id number]) on  11/16/2015 8:34:32 PM      MDM   Results for orders placed or performed during the hospital encounter of [id number]  Basic metabolic panel  Result Value Ref Range   Sodium 140 135 - 145 mmol/L   Potassium 3.7 3.5 - 5.1 mmol/L   Chloride 107 101 - 111 mmol/L   CO2 22 22 - 32 mmol/L   Glucose, Bld 106 (H) 65 - 99 mg/dL   BUN 17 6 - 20 mg/dL   Creatinine, Ser 0.95 0.61 - 1.24 mg/dL   Calcium 9.9 8.9 - 10.3 mg/dL   GFR calc non Af Amer >60 >60 mL/min   GFR calc Af Amer >60 >60 mL/min   Anion gap 11 5 - 15  CBC  Result Value Ref Range   WBC 9.5 4.0 - 10.5 K/uL   RBC 4.96 4.22 - 5.81 MIL/uL   Hemoglobin 15.3 13.0 - 17.0 g/dL   HCT 43.9 39.0 - 52.0 %   MCV 88.5 78.0 - 100.0 fL   MCH 30.8 26.0 - 34.0 pg   MCHC 34.9 30.0 - 36.0 g/dL   RDW 14.4 11.5 - 15.5 %   Platelets 267 150 - 400 K/uL  CBG monitoring, ED  Result Value Ref Range   Glucose-Capillary 96 65 - 99 mg/dL   No results found.  Filed Vitals:   11/16/15 2026 11/16/15 2355  BP: 178/114 177/128  Pulse: 107 88  Temp: 98.7 F (37.1 C)   Resp: 20 16   Medications - No data to display  Final diagnoses:  None    EKG Interpretation  Date/Time:  Thursday November 16 2015 20:31:26 EDT Ventricular Rate:  100 PR Interval:    QRS Duration: 95 QT Interval:  361 QTC Calculation: 466 R Axis:   71 Text Interpretation:  Sinus tachycardia Biatrial enlargement Left ventricular hypertrophy Borderline T abnormalities, inferior leads Baseline wander in lead(s) V1 V5 When compared with ECG of 05/07/2011, No significant change was found Confirmed by [facility]  MD, DAVID  ([id number]) on 11/16/2015 8:34:32 PM      Results for orders placed or performed during the hospital encounter of [id number]  Basic metabolic panel  Result Value Ref Range   Sodium 140 135 - 145 mmol/L   Potassium 3.7 3.5 - 5.1 mmol/L   Chloride 107 101 - 111 mmol/L   CO2 22 22 - 32 mmol/L   Glucose, Bld 106 (H) 65 - 99 mg/dL   BUN 17 6 - 20 mg/dL   Creatinine, Ser  0.95 0.61 - 1.24 mg/dL   Calcium 9.9 8.9 - 10.3 mg/dL   GFR calc non Af Amer >60 >60 mL/min   GFR calc Af Amer >60 >60 mL/min   Anion gap 11 5 - 15  CBC  Result Value Ref Range   WBC 9.5 4.0 - 10.5 K/uL   RBC 4.96 4.22 - 5.81 MIL/uL   Hemoglobin 15.3 13.0 - 17.0 g/dL   HCT 43.9 39.0 - 52.0 %   MCV 88.5 78.0 - 100.0 fL   MCH 30.8 26.0 - 34.0 pg   MCHC 34.9 30.0 - 36.0 g/dL   RDW 14.4 11.5 - 15.5 %   Platelets 267 150 - 400 K/uL  Urinalysis, Routine w reflex microscopic  Result Value Ref Range   Color, Urine AMBER (A) YELLOW   APPearance CLOUDY (A) CLEAR   Specific Gravity, Urine 1.032 (H) 1.005 - 1.030   pH 5.5 5.0 - 8.0   Glucose, UA NEGATIVE NEGATIVE mg/dL   Hgb urine dipstick MODERATE (A) NEGATIVE   Bilirubin Urine MODERATE (A) NEGATIVE   Ketones, ur 40 (A) NEGATIVE mg/dL   Protein, ur 100 (A) NEGATIVE mg/dL   Nitrite NEGATIVE NEGATIVE   Leukocytes, UA NEGATIVE NEGATIVE  Urine microscopic-add on  Result Value Ref Range   Squamous Epithelial / LPF 0-5 (A) NONE SEEN   WBC, UA NONE SEEN 0 - 5 WBC/hpf   RBC / HPF NONE SEEN 0 - 5 RBC/hpf   Bacteria, UA NONE SEEN NONE SEEN   Urine-Other MUCOUS PRESENT   CBG monitoring, ED  Result Value Ref Range   Glucose-Capillary 96 65 - 99 mg/dL   Dg Chest 2 View  11/17/2015  CLINICAL DATA:  65 year old male with hypertension EXAM: CHEST  2 VIEW COMPARISON:  Chest radiograph dated 05/07/2011 FINDINGS: The heart size and mediastinal contours are within normal limits. Both lungs are clear. The visualized skeletal structures are unremarkable. IMPRESSION: No active cardiopulmonary  disease. Electronically Signed   By: Anner Crete M.D.   On: 11/17/2015 00:45   Ct Head Wo Contrast  11/17/2015  CLINICAL DATA:  Acute onset of lightheadedness. Unsteady gait. High blood pressure. Initial encounter. EXAM: CT HEAD WITHOUT CONTRAST TECHNIQUE: Contiguous axial images were obtained from the base of the skull through the vertex without intravenous contrast. COMPARISON:  CT of the neck performed 05/01/2011 FINDINGS: There is an evolving large acute or subacute infarct involving the left occipital lobe, extending into the posterior aspect of the left temporal lobe. There is mild mass effect, without significant midline shift. No definite hemorrhagic transformation is noted, though there is slight irregularity about the left transverse sinus as it passes posterior to the infarct. Mild cerebellar atrophy is noted. Scattered periventricular and subcortical white matter change likely reflects small vessel ischemic microangiopathy. A chronic infarct is noted at the high right parietal lobe, with associated encephalomalacia. Prominence of the ventricles and sulci reflects mild cortical volume loss. The brainstem and fourth ventricle are within normal limits. There is no evidence of fracture; visualized osseous structures are unremarkable in appearance. The orbits are within normal limits. The paranasal sinuses and mastoid air cells are well-aerated. No significant soft tissue abnormalities are seen. IMPRESSION: 1. Evolving large acute or subacute infarct involving the left occipital lobe, extending into the posterior aspect of the left temporal lobe. Mild mass effect, without significant midline shift. No definite hemorrhagic transformation is seen, though there is slight irregularity of the left transverse sinus as it passes posterior to the infarct. 2.  Chronic infarct at the high right parietal lobe, with associated encephalomalacia. 3. Mild cortical volume loss and scattered small vessel ischemic  microangiopathy. These results were called by telephone at the time of interpretation on 11/17/2015 at 2:05 am to Dr. Veatrice Kells, who verbally acknowledged these results. Electronically Signed   By: Garald Balding M.D.   On: 11/17/2015 02:07   Per Dr. Nicole Kindred, MRI of the head  Have admitted to medicine step down at [facility] under Dr. Myna Hidalgo per Dr. Tamala Julian  I personally performed the services described in this documentation, which was scribed in my presence. The recorded information has been reviewed and is accurate.      Veatrice Kells, MD 11/17/15 [phone]

## 2015-11-17 ENCOUNTER — Emergency Department (HOSPITAL_COMMUNITY): Payer: Self-pay

## 2015-11-17 ENCOUNTER — Encounter (HOSPITAL_COMMUNITY): Payer: Self-pay | Admitting: Emergency Medicine

## 2015-11-17 ENCOUNTER — Encounter (HOSPITAL_COMMUNITY)
Admission: EM | Disposition: A | Discharge status: Home / Self Care | Payer: Self-pay | Source: Home / Self Care | Attending: Internal Medicine

## 2015-11-17 ENCOUNTER — Inpatient Hospital Stay (HOSPITAL_COMMUNITY): Payer: MEDICAID

## 2015-11-17 ENCOUNTER — Inpatient Hospital Stay (HOSPITAL_COMMUNITY): Payer: Self-pay

## 2015-11-17 ENCOUNTER — Other Ambulatory Visit (HOSPITAL_COMMUNITY): Payer: Self-pay

## 2015-11-17 DIAGNOSIS — I639 Cerebral infarction, unspecified: Secondary | ICD-10-CM | POA: Diagnosis present

## 2015-11-17 DIAGNOSIS — I63331 Cerebral infarction due to thrombosis of right posterior cerebral artery: Secondary | ICD-10-CM | POA: Insufficient documentation

## 2015-11-17 DIAGNOSIS — I6789 Other cerebrovascular disease: Secondary | ICD-10-CM

## 2015-11-17 DIAGNOSIS — R1310 Dysphagia, unspecified: Secondary | ICD-10-CM

## 2015-11-17 DIAGNOSIS — K219 Gastro-esophageal reflux disease without esophagitis: Secondary | ICD-10-CM

## 2015-11-17 DIAGNOSIS — I10 Essential (primary) hypertension: Secondary | ICD-10-CM | POA: Diagnosis present

## 2015-11-17 DIAGNOSIS — R4702 Dysphasia: Secondary | ICD-10-CM

## 2015-11-17 DIAGNOSIS — K222 Esophageal obstruction: Secondary | ICD-10-CM | POA: Insufficient documentation

## 2015-11-17 DIAGNOSIS — E43 Unspecified severe protein-calorie malnutrition: Secondary | ICD-10-CM | POA: Insufficient documentation

## 2015-11-17 DIAGNOSIS — I255 Ischemic cardiomyopathy: Secondary | ICD-10-CM | POA: Insufficient documentation

## 2015-11-17 DIAGNOSIS — Z720 Tobacco use: Secondary | ICD-10-CM

## 2015-11-17 DIAGNOSIS — Z8673 Personal history of transient ischemic attack (TIA), and cerebral infarction without residual deficits: Secondary | ICD-10-CM | POA: Insufficient documentation

## 2015-11-17 DIAGNOSIS — I63332 Cerebral infarction due to thrombosis of left posterior cerebral artery: Secondary | ICD-10-CM

## 2015-11-17 DIAGNOSIS — R634 Abnormal weight loss: Secondary | ICD-10-CM | POA: Diagnosis present

## 2015-11-17 HISTORY — PX: EP IMPLANTABLE DEVICE: SHX172B

## 2015-11-17 LAB — COMPREHENSIVE METABOLIC PANEL
ALK PHOS: 69 U/L (ref 38–126)
ALT: 18 U/L (ref 17–63)
AST: 36 U/L (ref 15–41)
Albumin: 4.2 g/dL (ref 3.5–5.0)
Anion gap: 11 (ref 5–15)
BILIRUBIN TOTAL: 1.3 mg/dL — AB (ref 0.3–1.2)
BUN: 18 mg/dL (ref 6–20)
CALCIUM: 9.3 mg/dL (ref 8.9–10.3)
CHLORIDE: 104 mmol/L (ref 101–111)
CO2: 24 mmol/L (ref 22–32)
CREATININE: 0.89 mg/dL (ref 0.61–1.24)
Glucose, Bld: 114 mg/dL — ABNORMAL HIGH (ref 65–99)
Potassium: 3.1 mmol/L — ABNORMAL LOW (ref 3.5–5.1)
Sodium: 139 mmol/L (ref 135–145)
Total Protein: 8.3 g/dL — ABNORMAL HIGH (ref 6.5–8.1)

## 2015-11-17 LAB — ECHOCARDIOGRAM COMPLETE
CHL CUP MV DEC (S): 225
E decel time: 225 msec
EERAT: 9.85
FS: 20 % — AB (ref 28–44)
Height: 71 in
IV/PV OW: 0.81
LA ID, A-P, ES: 34 mm
LA diam end sys: 34 mm
LA vol A4C: 53.2 ml
LA vol index: 31.5 mL/m2
LADIAMINDEX: 1.9 cm/m2
LAVOL: 56.3 mL
LDCA: 4.15 cm2
LV E/e' medial: 9.85
LV E/e'average: 9.85
LV PW d: 10.3 mm — AB (ref 0.6–1.1)
LV TDI E'LATERAL: 6.2
LVELAT: 6.2 cm/s
LVOTD: 23 mm
MV pk E vel: 61.1 m/s
MVPKAVEL: 96 m/s
TDI e' medial: 5.11
Weight: 2273.6 oz

## 2015-11-17 LAB — CBC WITH DIFFERENTIAL/PLATELET
BASOS ABS: 0 10*3/uL (ref 0.0–0.1)
Basophils Relative: 0 %
Eosinophils Absolute: 0 10*3/uL (ref 0.0–0.7)
Eosinophils Relative: 0 %
HEMATOCRIT: 40.6 % (ref 39.0–52.0)
HEMOGLOBIN: 14 g/dL (ref 13.0–17.0)
LYMPHS ABS: 2 10*3/uL (ref 0.7–4.0)
LYMPHS PCT: 24 %
MCH: 30.4 pg (ref 26.0–34.0)
MCHC: 34.5 g/dL (ref 30.0–36.0)
MCV: 88.1 fL (ref 78.0–100.0)
Monocytes Absolute: 0.6 10*3/uL (ref 0.1–1.0)
Monocytes Relative: 7 %
NEUTROS ABS: 5.7 10*3/uL (ref 1.7–7.7)
Neutrophils Relative %: 69 %
PLATELETS: 278 10*3/uL (ref 150–400)
RBC: 4.61 MIL/uL (ref 4.22–5.81)
RDW: 14.4 % (ref 11.5–15.5)
WBC: 8.3 10*3/uL (ref 4.0–10.5)

## 2015-11-17 LAB — LIPID PANEL
Cholesterol: 222 mg/dL — ABNORMAL HIGH (ref 0–200)
HDL: 73 mg/dL (ref >40)
LDL CALC: 136 mg/dL — AB (ref 0–99)
TRIGLYCERIDES: 66 mg/dL (ref <150)
Total CHOL/HDL Ratio: 3 RATIO
VLDL: 13 mg/dL (ref 0–40)

## 2015-11-17 LAB — CBC
HEMATOCRIT: 40 % (ref 39.0–52.0)
Hemoglobin: 13.5 g/dL (ref 13.0–17.0)
MCH: 29.8 pg (ref 26.0–34.0)
MCHC: 33.8 g/dL (ref 30.0–36.0)
MCV: 88.3 fL (ref 78.0–100.0)
Platelets: 260 10*3/uL (ref 150–400)
RBC: 4.53 MIL/uL (ref 4.22–5.81)
RDW: 14.3 % (ref 11.5–15.5)
WBC: 8.9 10*3/uL (ref 4.0–10.5)

## 2015-11-17 LAB — URINALYSIS, ROUTINE W REFLEX MICROSCOPIC
GLUCOSE, UA: NEGATIVE mg/dL
KETONES UR: 40 mg/dL — AB
Leukocytes, UA: NEGATIVE
NITRITE: NEGATIVE
PH: 5.5 (ref 5.0–8.0)
Protein, ur: 100 mg/dL — AB
SPECIFIC GRAVITY, URINE: 1.032 — AB (ref 1.005–1.030)

## 2015-11-17 LAB — URINE MICROSCOPIC-ADD ON
Bacteria, UA: NONE SEEN
RBC / HPF: NONE SEEN RBC/hpf (ref 0–5)
WBC UA: NONE SEEN WBC/hpf (ref 0–5)

## 2015-11-17 LAB — RAPID URINE DRUG SCREEN, HOSP PERFORMED
AMPHETAMINES: NOT DETECTED
Barbiturates: NOT DETECTED
Benzodiazepines: NOT DETECTED
Cocaine: NOT DETECTED
OPIATES: NOT DETECTED
Tetrahydrocannabinol: POSITIVE — AB

## 2015-11-17 LAB — CREATININE, SERUM: Creatinine, Ser: 1.12 mg/dL (ref 0.61–1.24)

## 2015-11-17 LAB — SEDIMENTATION RATE: SED RATE: 26 mm/h — AB (ref 0–16)

## 2015-11-17 LAB — PREALBUMIN: PREALBUMIN: 21.7 mg/dL (ref 18–38)

## 2015-11-17 SURGERY — LOOP RECORDER INSERTION
Anesthesia: LOCAL

## 2015-11-17 SURGERY — ECHOCARDIOGRAM, TRANSESOPHAGEAL
Anesthesia: Moderate Sedation

## 2015-11-17 MED ORDER — ATORVASTATIN CALCIUM 40 MG PO TABS
40.0000 mg | ORAL_TABLET | Freq: Every day | ORAL | Status: DC
  Administered 2015-11-17 – 2015-11-22 (×5): 40 mg via ORAL
  Filled 2015-11-17 (×5): qty 1

## 2015-11-17 MED ORDER — IOPAMIDOL (ISOVUE-370) INJECTION 76%
INTRAVENOUS | Status: AC
  Administered 2015-11-17: 50 mL
  Filled 2015-11-17: qty 50

## 2015-11-17 MED ORDER — NICOTINE 21 MG/24HR TD PT24: 21.0000 mg | MEDICATED_PATCH | Freq: Every day | TRANSDERMAL | Status: DC | PRN

## 2015-11-17 MED ORDER — SODIUM CHLORIDE 0.9 % IV SOLN
INTRAVENOUS | Status: DC
  Administered 2015-11-18: 06:00:00 via INTRAVENOUS

## 2015-11-17 MED ORDER — POTASSIUM CHLORIDE 10 MEQ/100ML IV SOLN
10.0000 meq | INTRAVENOUS | Status: AC
Start: 2015-11-17 — End: 2015-11-17
  Administered 2015-11-17 (×3): 10 meq via INTRAVENOUS
  Filled 2015-11-17 (×3): qty 100

## 2015-11-17 MED ORDER — ONDANSETRON HCL 4 MG/2ML IJ SOLN: 4.0000 mg | Freq: Four times a day (QID) | INTRAMUSCULAR | Status: DC | PRN

## 2015-11-17 MED ORDER — ENSURE ENLIVE PO LIQD
237.0000 mL | Freq: Two times a day (BID) | ORAL | Status: DC
  Administered 2015-11-18 – 2015-11-23 (×9): 237 mL via ORAL
  Filled 2015-11-17 (×15): qty 237

## 2015-11-17 MED ORDER — HYDROCHLOROTHIAZIDE 12.5 MG PO CAPS
12.5000 mg | ORAL_CAPSULE | Freq: Once | ORAL | Status: AC
  Administered 2015-11-17: 12.5 mg via ORAL
  Filled 2015-11-17: qty 1

## 2015-11-17 MED ORDER — HEPARIN SODIUM (PORCINE) 5000 UNIT/ML IJ SOLN
5000.0000 [IU] | Freq: Three times a day (TID) | INTRAMUSCULAR | Status: DC
  Administered 2015-11-17 – 2015-11-23 (×16): 5000 [IU] via SUBCUTANEOUS
  Filled 2015-11-17 (×16): qty 1

## 2015-11-17 MED ORDER — HYDRALAZINE HCL 20 MG/ML IJ SOLN
10.0000 mg | INTRAMUSCULAR | Status: DC | PRN
Start: 2015-11-17 — End: 2015-11-23
  Administered 2015-11-17 – 2015-11-19 (×2): 10 mg via INTRAVENOUS
  Filled 2015-11-17 (×2): qty 1

## 2015-11-17 MED ORDER — ASPIRIN EC 325 MG PO TBEC
325.0000 mg | DELAYED_RELEASE_TABLET | Freq: Every day | ORAL | Status: DC
  Administered 2015-11-17 – 2015-11-18 (×2): 325 mg via ORAL
  Filled 2015-11-17 (×2): qty 1

## 2015-11-17 MED ORDER — FAMOTIDINE IN NACL 20-0.9 MG/50ML-% IV SOLN
20.0000 mg | Freq: Two times a day (BID) | INTRAVENOUS | Status: DC
  Administered 2015-11-17: 20 mg via INTRAVENOUS
  Filled 2015-11-17: qty 50

## 2015-11-17 MED ORDER — ACETAMINOPHEN 325 MG PO TABS: 325.0000 mg | ORAL_TABLET | ORAL | Status: DC | PRN

## 2015-11-17 MED ORDER — LIDOCAINE-EPINEPHRINE 1 %-1:100000 IJ SOLN
INTRAMUSCULAR | Status: AC
Start: 2015-11-17 — End: 2015-11-17
  Filled 2015-11-17: qty 1

## 2015-11-17 MED ORDER — STROKE: EARLY STAGES OF RECOVERY BOOK
Freq: Once | Status: AC
  Administered 2015-11-17: 07:00:00
  Filled 2015-11-17 (×2): qty 1

## 2015-11-17 SURGICAL SUPPLY — 2 items
LOOP REVEAL LINQSYS (Prosthesis & Implant Heart) ×1 IMPLANT
PACK LOOP INSERTION (CUSTOM PROCEDURE TRAY) ×2 IMPLANT

## 2015-11-17 NOTE — H&P (Signed)
History and Physical    Gregory Perry MRN:[id number] DOB: 07/05/1950 DOA: 11/16/2015  Referring MD/NP/PA: Dr. Palumbo PCP: PROVIDER NOT IN SYSTEM  Patient coming from: Home  Chief Complaint: "I was feeling unusual"  HPI: Gregory Perry is a 65 y.o. male with medical history significant of HTN, tobacco abuse, esophageal mass, and dysphagia; who presents with complaints of feeling unusual since approximately 3 AM yesterday morning. He further explains that yesterday morning around approximately 3 AM he got up to use the bathroom but reports losing his balance and falling to the ground. He notes that this was unusual for him and has never happened before. He denies any trauma to his head. He's not quite sure if he lost consciousness or not. Thereafter, throughout the day he just did not feel right. Complaining of associated symptoms of a sharp frontal headache. Tried to take ibuprofen without relief of symptoms. He questioned if his blood pressure was elevated. Patient previously was seen by Dr. Blount , but he states that he passed away and he has not reestablished care with a new primary care provider. He estimates that he's been without any of his previous medications which he notes included lisinopril or Lasix for 6 or 7 months now. Gregory Perry also brings up the fact that over the last 6 months he has lost approximately 18-20 pounds due to difficulty swallowing. He reports getting choked whenever trying to take any food or meds and therefore her days in which she does not eat or drink. The family member present in the room also noticed that he had a similar episode where he was not able to keep his balance that occurred approximately 3 months ago.  He had a fall at that time and she noted that saw his eyes rolled in the back of his head, but he was not noted to have any seizure-like activity, tongue biting, or loss of bowel or bladder.  ED Course: Patient was evaluated and seen to have elevated  blood pressure as high as 178/114. Lab work was relatively unremarkable. Scan of the brain showed a large left-sided occipital infarct extending into the temporal lobe with mild mass effect. Dr. Stewart of neurology was consulted by the ED physician and recommended transfer to [place]. Patient had been given hydrochlorothiazide and he had significant difficulty in trying to swallow. Patient thereafter was placed nothing by mouth.  Review of Systems: As per HPI otherwise 10 point review of systems negative.   Past Medical History  Diagnosis Date  . Hypertension   . Dysphagia   . Headache(784.0)     history of    Past Surgical History  Procedure Laterality Date  . No past surgeries    . Esophagogastroduodenoscopy  04/29/2011    Procedure: ESOPHAGOGASTRODUODENOSCOPY (EGD);  Surgeon: James L Edwards Jr., MD;  Location: [facility]COPY;  Service: Endoscopy;  Laterality: N/A;  . Direct laryngoscopy  05/07/2011    Procedure: DIRECT LARYNGOSCOPY;  Surgeon: Christopher E Newman, MD;  Location: [facility] OR;  Service: ENT;  Laterality: N/A;  ESOPHAGOSCOPY  WITH DILATION     reports that he has been smoking Cigarettes.  He has a 15 pack-year smoking history. He does not have any smokeless tobacco history on file. He reports that he drinks about 1.8 oz of alcohol per week. He reports that he uses illicit drugs (Marijuana).  No Known Allergies  History reviewed. No pertinent family history.  Prior to Admission medications   Medication Sig Start Date End Date   Taking? Authorizing Provider  ibuprofen (ADVIL,MOTRIN) 200 MG tablet Take 400 mg by mouth every 6 (six) hours as needed for moderate pain.   Yes Historical Provider, MD    Physical Exam: Filed Vitals:   11/16/15 2026 11/16/15 2355 11/17/15 0234  BP: 178/114 177/128 178/102  Pulse: 107 88 81  Temp: 98.7 F (37.1 C)    Resp: _0 SpO2: 100% 100% 100%      Constitutional: Elderly male who appears cachectic, but is in NAD, calm,  comfortable Filed Vitals:   11/16/15 2026 11/16/15 2355 11/17/15 0234  BP: 178/114 177/128 178/102  Pulse: 107 88 81  Temp: 98.7 F (37.1 C)    Resp: _1 SpO2: 100% 100% 100%   Eyes: PERRL, lids and conjunctivae normal ENMT: Mucous membranes are moist. Posterior pharynx clear of any exudate or lesions.Normal dentition.  Neck: normal, supple, no masses, no thyromegaly Respiratory: clear to auscultation bilaterally, no wheezing, no crackles. Normal respiratory effort. No accessory muscle use.  Cardiovascular: Regular rate and rhythm, no murmurs / rubs / gallops. No extremity edema. 2+ pedal pulses. No carotid bruits.  Abdomen: no tenderness, no masses palpated. No hepatosplenomegaly. Bowel sounds positive.  Musculoskeletal: no clubbing / cyanosis. No joint deformity upper and lower extremities. Good ROM, no contractures. Normal muscle tone.  Skin: no rashes, lesions, ulcers. No induration Neurologic: CN 2-12 grossly intact. Sensation intact, DTR normal. Strength 5/5 in all 4.  Psychiatric: Normal judgment and insight. Alert and oriented x 3. Normal mood.     Labs on Admission: I have personally reviewed following labs and imaging studies  CBC:  Recent Labs Lab 11/16/15 2101  WBC 9.5  HGB 15.3  HCT 43.9  MCV 88.5  PLT 903   Basic Metabolic Panel:  Recent Labs Lab 11/16/15 2101  NA 140  K 3.7  CL 107  CO2 22  GLUCOSE 106*  BUN 17  CREATININE 0.95  CALCIUM 9.9   GFR: CrCl cannot be calculated (Unknown ideal weight.). Liver Function Tests: No results for input(s): AST, ALT, ALKPHOS, BILITOT, PROT, ALBUMIN in the last 168 hours. No results for input(s): LIPASE, AMYLASE in the last 168 hours. No results for input(s): AMMONIA in the last 168 hours. Coagulation Profile: No results for input(s): INR, PROTIME in the last 168 hours. Cardiac Enzymes: No results for input(s): CKTOTAL, CKMB, CKMBINDEX, TROPONINI in the last 168 hours. BNP (last 3 results) No results  for input(s): PROBNP in the last 8760 hours. HbA1C: No results for input(s): HGBA1C in the last 72 hours. CBG:  Recent Labs Lab 11/16/15 2346  GLUCAP 96   Lipid Profile: No results for input(s): CHOL, HDL, LDLCALC, TRIG, CHOLHDL, LDLDIRECT in the last 72 hours. Thyroid Function Tests: No results for input(s): TSH, T4TOTAL, FREET4, T3FREE, THYROIDAB in the last 72 hours. Anemia Panel: No results for input(s): VITAMINB12, FOLATE, FERRITIN, TIBC, IRON, RETICCTPCT in the last 72 hours. Urine analysis:    Component Value Date/Time   COLORURINE AMBER* 11/16/2015 2330   APPEARANCEUR CLOUDY* 11/16/2015 2330   LABSPEC 1.032* 11/16/2015 2330   PHURINE 5.5 11/16/2015 2330   GLUCOSEU NEGATIVE 11/16/2015 2330   HGBUR MODERATE* 11/16/2015 2330   BILIRUBINUR MODERATE* 11/16/2015 2330   KETONESUR 40* 11/16/2015 2330   PROTEINUR 100* 11/16/2015 2330   NITRITE NEGATIVE 11/16/2015 2330   LEUKOCYTESUR NEGATIVE 11/16/2015 2330   Sepsis Labs: No results found for this or any previous visit (from the past 240 hour(s)).   Radiological Exams on Admission:  Dg Chest 2 View  11/17/2015  CLINICAL DATA:  65-year-old male with hypertension EXAM: CHEST  2 VIEW COMPARISON:  Chest radiograph dated 05/07/2011 FINDINGS: The heart size and mediastinal contours are within normal limits. Both lungs are clear. The visualized skeletal structures are unremarkable. IMPRESSION: No active cardiopulmonary disease. Electronically Signed   By: Arash  Radparvar M.D.   On: 11/17/2015 00:45   Ct Head Wo Contrast  11/17/2015  CLINICAL DATA:  Acute onset of lightheadedness. Unsteady gait. High blood pressure. Initial encounter. EXAM: CT HEAD WITHOUT CONTRAST TECHNIQUE: Contiguous axial images were obtained from the base of the skull through the vertex without intravenous contrast. COMPARISON:  CT of the neck performed 05/01/2011 FINDINGS: There is an evolving large acute or subacute infarct involving the left occipital lobe,  extending into the posterior aspect of the left temporal lobe. There is mild mass effect, without significant midline shift. No definite hemorrhagic transformation is noted, though there is slight irregularity about the left transverse sinus as it passes posterior to the infarct. Mild cerebellar atrophy is noted. Scattered periventricular and subcortical white matter change likely reflects small vessel ischemic microangiopathy. A chronic infarct is noted at the high right parietal lobe, with associated encephalomalacia. Prominence of the ventricles and sulci reflects mild cortical volume loss. The brainstem and fourth ventricle are within normal limits. There is no evidence of fracture; visualized osseous structures are unremarkable in appearance. The orbits are within normal limits. The paranasal sinuses and mastoid air cells are well-aerated. No significant soft tissue abnormalities are seen. IMPRESSION: 1. Evolving large acute or subacute infarct involving the left occipital lobe, extending into the posterior aspect of the left temporal lobe. Mild mass effect, without significant midline shift. No definite hemorrhagic transformation is seen, though there is slight irregularity of the left transverse sinus as it passes posterior to the infarct. 2. Chronic infarct at the high right parietal lobe, with associated encephalomalacia. 3. Mild cortical volume loss and scattered small vessel ischemic microangiopathy. These results were called by telephone at the time of interpretation on 11/17/2015 at 2:05 am to Dr. APRIL PALUMBO, who verbally acknowledged these results. Electronically Signed   By: Jeffery  Chang M.D.   On: 11/17/2015 02:07    EKG: Independently reviewed. Sinus tachycardia with biatrial enlargement and signs of LVH  Assessment/Plan CVA (cerebral infarction): Acute/subacute. Patient noted to have issues with balance and complaints of headache. CT scan of the brain showed a large left-sided occipital  infarct with extension into the temporal lobe and mild mass effect. - Admit to a  telemetry bed - NPO for now - PT/OT/speech to eval and treat - check MRI/ MRA - check Echocardiogram /Vascular carotid duplex ultrasound in a.m. - Nursing orders placed for neurology to be notified once the patient arrives at [facility].  - Neurology consultation by ED spoke with Dr. Stewart who recommended transfer to [place]  Dysphagia:  Chronic. Patient reports a three-year history of difficulty swallowing. Previous history of esophageal mass back in 2012 which was noted to not show any signs of Barrett's esophagus or signs of malignancy. This could be related to previous strokes noted on CT scan of the brain versus underlying esophageal abnormality. - Speech therapy to eval and treat in a.m. - Patient may warrant further evaluation by gastroenterology  Essential hypertension: - Hydralazine prn   Tobacco abuse  - nicotine patch when necessary smoking cessation    Weight loss - Check ESR and prealbumin in am  GERD prophylaxis - Pepcid   IV  DVT prophylaxis: SCDs for now secondary to large stroke Code Status: Full  Family Communication: Discussed plan with family members present at bedside Disposition Plan: Undetermined Consults called: Neurology Admission status: *Inpatient telemetry bed Norval Morton MD [facility] Pager [phone]  If 7PM-7AM, please contact night-coverage [email or web address] Password [facility]  11/17/2015, 2:48 AM

## 2015-11-17 NOTE — Progress Notes (Signed)
Echo reviewed - EF 35-40%, no significant valvular abnormalities, negative bubble study  Discussed with Dr Lovena Le, will plan ILR implant with outpatient follow up for medication optimization and likely myoview to rule out ischemia (patient is very active without functional limitations)  Chanetta Marshall, NP 11/17/2015 3:40 PM

## 2015-11-17 NOTE — H&P (View-Only) (Signed)
Echo reviewed - EF 35-40%, no significant valvular abnormalities, negative bubble study  Discussed with Dr Lovena Le, will plan ILR implant with outpatient follow up for medication optimization and likely myoview to rule out ischemia (patient is very active without functional limitations)  Chanetta Marshall, NP 11/17/2015 3:40 PM

## 2015-11-17 NOTE — Evaluation (Signed)
Clinical/Bedside Swallow Evaluation Patient Details  Name: Gregory Perry MRN: [id number] Date of Birth: [birth year]  Today's Date: 11/17/2015 Time: SLP Start Time (ACUTE ONLY): 0835 SLP Stop Time (ACUTE ONLY): 0900 SLP Time Calculation (min) (ACUTE ONLY): 25 min  Past Medical History:  Past Medical History  Diagnosis Date  . Hypertension   . Dysphagia   . Headache(784.0)     history of   Past Surgical History:  Past Surgical History  Procedure Laterality Date  . No past surgeries    . Esophagogastroduodenoscopy  04/29/2011    Procedure: ESOPHAGOGASTRODUODENOSCOPY (EGD);  Surgeon: Winfield Cunas., MD;  Location: [facility];  Service: Endoscopy;  Laterality: N/A;  . Direct laryngoscopy  05/07/2011    Procedure: DIRECT LARYNGOSCOPY;  Surgeon: Rozetta Nunnery, MD;  Location: [facility] OR;  Service: ENT;  Laterality: N/A;  ESOPHAGOSCOPY  WITH DILATION   HPI:  Pt is a 65 y.o. male with PMH of HTN, tobacco abuse, esophageal mass, and dysphagia; presented to ED 6/22 after losing balance and sharp frontal headache. Pt also reported losing 18-20 lbds due to difficulty swallowing over the past 6 months. Reports getting choked whenever trying to take any food or meds and has days of not eating/ drinking. MRI showed acute infarct L PCA territory- L posterior cerebral- poseterior temporal/ occipital lobe, small area of acute infarct L thalamus, chronic infarct R parietal cortex, chronic infarct L caudate and R thalamus, slight chronic ischemia L pons. CXR normal. Pt had esophagram in 2012 showing mild dysmotility, narrowing at thoracic inlet, asymmetry within hypopharynx with relative outpouching at L pyriform sinus. Bedside swallow eval ordered- no difficulties noted on stroke swallow screen; however, pt has hx of dysphagia complaints ongoing for the past 6 months.    Assessment / Plan / Recommendation Clinical Impression  Pt showed no overt s/s of aspiration at bedside. No c/o globus sensation  or regurgitation at bedside across consistencies. However, pt reports significant difficulty with foods that are a tough texture such as meats and some fruits. Reported that symptoms are inconsistent- sometimes no difficulty, other times significant and needing to regurgitate, to the point that pt does not eat some days and has had significant weight loss. Also reported significant difficulty with pills reporting sensation that they get stuck in throat. No oropharyngeal concerns reported or demonstrated during eval. Aspiration risk appears mild at this time with modified diet. Recommend initiating dysphagia 3 diet, thin liquids, meds crushed in puree, intermittent supervision to cue for strategies. Reviewed reflux precautions- sit upright 60 minutes after meal, alternate food with liquid, small bites/ sips, small frequent meals. Will continue to follow to check for diet tolerance/ review strategies. Would also recommend further esophageal assessment.     Aspiration Risk  Mild aspiration risk    Diet Recommendation Dysphagia 3 (Mech soft);Thin liquid   Liquid Administration via: Cup;Straw Medication Administration: Crushed with puree Supervision: Patient able to self feed;Intermittent supervision to cue for compensatory strategies Compensations: Slow rate;Small sips/bites;Follow solids with liquid Postural Changes: Seated upright at 90 degrees;Remain upright for at least 30 minutes after po intake    Other  Recommendations Recommended Consults: Consider esophageal assessment Oral Care Recommendations: Oral care BID   Follow up Recommendations  Other (comment) (TBD)    Frequency and Duration min 1 x/week  1 week       Prognosis Prognosis for Safe Diet Advancement: Fair Barriers to Reach Goals: Other (Comment) (esophageal issues)      Swallow Study   General  HPI: Pt is a 65 y.o. male with PMH of HTN, tobacco abuse, esophageal mass, and dysphagia; presented to ED 6/22 after losing balance  and sharp frontal headache. Pt also reported losing 18-20 lbds due to difficulty swallowing over the past 6 months. Reports getting choked whenever trying to take any food or meds and has days of not eating/ drinking. MRI showed acute infarct L PCA territory- L posterior cerebral- poseterior temporal/ occipital lobe, small area of acute infarct L thalamus, chronic infarct R parietal cortex, chronic infarct L caudate and R thalamus, slight chronic ischemia L pons. CXR normal. Pt had esophagram in 2012 showing mild dysmotility, narrowing at thoracic inlet, asymmetry within hypopharynx with relative outpouching at L pyriform sinus. Bedside swallow eval ordered- no difficulties noted on stroke swallow screen; however, pt has hx of dysphagia complaints ongoing for the past 6 months.  Type of Study: Bedside Swallow Evaluation Diet Prior to this Study: NPO Temperature Spikes Noted: No Respiratory Status: Room air History of Recent Intubation: No Behavior/Cognition: Alert;Cooperative;Pleasant mood Oral Cavity Assessment: Within Functional Limits Oral Cavity - Dentition: Missing dentition Vision: Functional for self-feeding Self-Feeding Abilities: Able to feed self Patient Positioning: Upright in bed Baseline Vocal Quality: Normal Volitional Cough: Strong Volitional Swallow: Able to elicit    Oral/Motor/Sensory Function Overall Oral Motor/Sensory Function: Mild impairment Facial ROM: Reduced right Facial Symmetry: Abnormal symmetry right Facial Strength: Within Functional Limits Facial Sensation: Within Functional Limits Lingual ROM: Within Functional Limits Lingual Symmetry: Within Functional Limits Lingual Strength: Within Functional Limits   Ice Chips Ice chips: Not tested   Thin Liquid Thin Liquid: Within functional limits Presentation: Cup;Straw    Nectar Thick Nectar Thick Liquid: Not tested   Honey Thick Honey Thick Liquid: Not tested   Puree Puree: Within functional limits Presentation:  Self Fed;Spoon   Solid   GO   Solid: Within functional limits Presentation: Self Patton Salles, Ikenna Ohms K, MA, CCC-SLP 11/17/2015,9:19 AM [phone]

## 2015-11-17 NOTE — Progress Notes (Signed)
STROKE TEAM PROGRESS NOTE   HISTORY OF PRESENT ILLNESS (per record) Gregory Perry is an 65 y.o. male with a history of headache and noncompliance with treatment it came to the ED at [facility] with a complaint of lightheadedness and a fall at home earlier. There was no loss of consciousness. Headache was primarily frontal. Blood pressure was elevated at 178/114. He had no change in speech and no facial droop was described. He did not experience motor or sensory abnormalities involving extremities. He also had no visual changes. CT scan of his brain, however, showed an evolving acute to subacute large left PCA territory ischemic infarction. An old right parietal infarction with encephalomalacia was also noted. NIH stroke score was the time of this evaluation was 3. He was LKW 11/16/2015, unclear time. Patient was not administered IV t-PA secondary to unclear last known well. He was admitted for further evaluation and treatment.   SUBJECTIVE (INTERVAL HISTORY) His wife is at the bedside.  Overall he feels his condition is stable. States he is trying to stop drinking, but just can't. He does not drink every day. He is willing to quit smoking. He has not been taking medications because he no longer has a doctor he can go to. He does not have insurance.    OBJECTIVE Temp:  [98 F (36.7 C)-98.7 F (37.1 C)] 98.5 F (36.9 C) (06/23 0730) Pulse Rate:  [73-107] 78 (06/23 0730) Cardiac Rhythm:  [-] Normal sinus rhythm (06/23 0801) Resp:  [14-20] 16 (06/23 0730) BP: (141-178)/(82-128) 159/97 mmHg (06/23 0730) SpO2:  [100 %] 100 % (06/23 0730) Weight:  [64.456 kg (142 lb 1.6 oz)] 64.456 kg (142 lb 1.6 oz) (06/23 0530)  CBC:  Recent Labs Lab 11/16/15 2101 11/17/15 0426  WBC 9.5 8.3  NEUTROABS  --  5.7  HGB 15.3 14.0  HCT 43.9 40.6  MCV 88.5 88.1  PLT 267 0000000    Basic Metabolic Panel:  Recent Labs Lab 11/16/15 2101 11/17/15 0426  NA 140 139  K 3.7 3.1*  CL 107 104  CO2 22  24  GLUCOSE 106* 114*  BUN 17 18  CREATININE 0.95 0.89  CALCIUM 9.9 9.3    Lipid Panel:    Component Value Date/Time   CHOL 222* 11/17/2015 0426   TRIG 66 11/17/2015 0426   HDL 73 11/17/2015 0426   CHOLHDL 3.0 11/17/2015 0426   VLDL 13 11/17/2015 0426   LDLCALC 136* 11/17/2015 0426   HgbA1c: No results found for: HGBA1C Urine Drug Screen:    Component Value Date/Time   LABOPIA NONE DETECTED 11/16/2015 2330   COCAINSCRNUR NONE DETECTED 11/16/2015 2330   LABBENZ NONE DETECTED 11/16/2015 2330   AMPHETMU NONE DETECTED 11/16/2015 2330   THCU POSITIVE* 11/16/2015 2330   LABBARB NONE DETECTED 11/16/2015 2330      IMAGING I have personally reviewed the radiological images below and agree with the radiology interpretations.  Dg Chest 2 View 11/17/2015  No active cardiopulmonary disease.   Ct Head Wo Contrast 11/17/2015  1. Evolving large acute or subacute infarct involving the left occipital lobe, extending into the posterior aspect of the left temporal lobe. Mild mass effect, without significant midline shift. No definite hemorrhagic transformation is seen, though there is slight irregularity of the left transverse sinus as it passes posterior to the infarct. 2. Chronic infarct at the high right parietal lobe, with associated encephalomalacia. 3. Mild cortical volume loss and scattered small vessel ischemic microangiopathy.   Ct Angio Neck W  Or Wo Contrast 11/17/2015   No significant carotid or vertebral artery stenosis in the neck. Minimal atherosclerotic disease at the carotid bifurcation bilaterally.   Mr Brain Wo Contrast 11/17/2015  Acute infarct left PCA territory.  No hemorrhage Extensive chronic ischemic changes. Chronic infarct right parietal cortex. Chronic microvascular ischemic changes throughout the white matter. Chronic infarct left caudate and right thalamus. Slight chronic ischemia left pons.  Mr Jodene Nam Head/brain Wo Cm 11/17/2015  Severe stenosis left posterior cerebral  artery. Moderate stenosis right posterior cerebral artery.   TTE - - Left ventricle: The cavity size was mildly dilated. There was  mild concentric hypertrophy. Systolic function was moderately  reduced. The estimated ejection fraction was in the range of 35%  to 40%. Diffuse hypokinesis. Doppler parameters are consistent  with abnormal left ventricular relaxation (grade 1 diastolic  dysfunction). There was no evidence of elevated ventricular  filling pressure by Doppler parameters. - Aortic valve: There was trivial regurgitation. - Aortic root: The aortic root was normal in size. - Mitral valve: Structurally normal valve. - Right ventricle: Systolic function was normal. - Right atrium: The atrium was normal in size. - Atrial septum: No defect or patent foramen ovale was identified. - Pulmonary arteries: Systolic pressure was within the normal  range. - Inferior vena cava: The vessel was normal in size. - Pericardium, extracardiac: There was no pericardial effusion. Impressions: - Negative bubble syudy. No evidence for ASD or PFO.   PHYSICAL EXAM  Temp:  [98 F (36.7 C)-98.7 F (37.1 C)] 98.5 F (36.9 C) (06/23 0730) Pulse Rate:  [73-107] 78 (06/23 0730) Resp:  [14-20] 16 (06/23 0730) BP: (141-178)/(82-128) 159/97 mmHg (06/23 0730) SpO2:  [100 %] 100 % (06/23 0730) Weight:  [142 lb 1.6 oz (64.456 kg)] 142 lb 1.6 oz (64.456 kg) (06/23 0530)  General - Well nourished, well developed, in no apparent distress.  Ophthalmologic - Fundi not visualized due to small pupils.  Cardiovascular - Regular rate and rhythm.  Mental Status -  Level of arousal and orientation to time, place, and person were intact. Language including expression, repetition, comprehension was assessed and found intact, naming 3/4. Fund of Knowledge was assessed and was intact  Cranial Nerves II - XII - II - right homonymous hemianopia. III, IV, VI - Extraocular movements intact. V - Facial  sensation intact bilaterally. VII - Facial movement intact bilaterally. VIII - Hearing & vestibular intact bilaterally. X - Palate elevates symmetrically. XI - Chin turning & shoulder shrug intact bilaterally. XII - Tongue protrusion intact.  Motor Strength - The patient's strength was normal in all extremities and pronator drift was absent.  Bulk was normal and fasciculations were absent.   Motor Tone - Muscle tone was assessed at the neck and appendages and was normal.  Reflexes - The patient's reflexes were 1+ in all extremities and he had no pathological reflexes.  Sensory - Light touch, temperature/pinprick were assessed and were symmetrical except LLE decreased sensation.    Coordination - The patient had normal movements in the hands with no ataxia or dysmetria.  Tremor was absent.  Gait and Station - deferred.   ASSESSMENT/PLAN Gregory Perry is a 65 y.o. male with history of HA and noncompliance presenting with lightheadedness and a fall at home. He did not receive IV t-PA due to unclear LKW.   Stroke:  left PCA territory acute infarct and chronic right parietal cortical infarct, embolic secondary to unknown source  Resultant right hemianopia, left LE decreased sensation  MRI  L PCA infarct acute, and chronic right parietal cortical infarct  MRA  Severe stenosis L PCA, moderated R PCA  CTA neck no significant stenosis  2D Echo  EF 35-40%   TEE not able to be done due to hx of esophagus striction   Loop placed  LDL 136  HgbA1c pending  SCDs for VTE prophylaxis  Diet NPO time specified  No antithrombotic prior to admission, now on No antithrombotic. Add aspirin 325 mg daily. Continue ASA on discharge.   Ongoing aggressive stroke risk factor management  Therapy recommendations:  OP OT, PT pending   Pt can not drive at this time. I discussed with pt and his wife.  Disposition:  Return home  Cardiomyopathy  EF 35-40%  Cardiology recommend myoview  to rule out ischemia  Need outpt follow up with cardiology  Loop recorder placed  Card recs appreciated  Hx of esophagus stricture and dysphagia   Chronic dysphagia  S/p dilation by ENT in 2012  GI recs appreciated  Agree with nutrition consult  Hypertension  BP 178/114 on arrival in setting of neurologic symptoms  Stable now  Permissive hypertension (OK if < 220/120) but gradually normalize in 5-7 days  Long-term BP goal normotensive  Hyperlipidemia  Home meds:  No statin  LDL 136, goal < 70  Add lipitor 40  Continue statin at discharge  Tobacco abuse  Current smoker  Smoking cessation counseling provided  Nicotine patch provided  Pt is willing to quit  Other Stroke Risk Factors  ETOH use, advised to drink no more than 2 drink(s) a day  Other Active Problems  Unexplained weight loss  tachycardia  Hospital day # 0  Neurology will sign off. Please call with questions. Pt will follow up with Dr. Erlinda Hong at [facility] in about 2 months. Thanks for the consult.  Rosalin Hawking, MD PhD Stroke Neurology 11/17/2015 4:42 PM    To contact Stroke Continuity provider, please refer to [email or web address] After hours, contact General Neurology

## 2015-11-17 NOTE — ED Notes (Signed)
Pt did not advise staff of difficulty swallowing pills until after given hydrochlorothiazide capsule and nearly choking on the medication.  Pt attempted to clear esophagus of pill several times, including mashing his throat to move the pill.  Attempt was made to perform Heimlich maneuver without success.  Coffee was given in an attempt to dissolve pill with success.  Dr. Randal Buba informed.  Patient remains alert, oriented x 4 and ambulatory.

## 2015-11-17 NOTE — Progress Notes (Signed)
Admitted via [place] to 5C13.  A&O x4 .  C/O headache, oriented to safety precautions, orders & teltmetry.  CCMD notified of tele & verified.

## 2015-11-17 NOTE — Consult Note (Signed)
[facility] Consult: 1:22 PM 11/17/2015  LOS: 0 days    Referring Provider: Dr Tat  Primary Care Physician:  Previously Lindwood Qua who died.  Primary Gastroenterologist:  Althia Forts.  Dismissed from Dr Oletta Lamas.      Reason for Consultation:  Dysphagia, odynophagia.    HPI: Gregory Perry is a 65 y.o. male.  Hx benign upper esophageal stricture. HTN.  Tobacco abuse.  Hyperlipidemia. HTN.      EGD in 04/2011 by Dr Oletta Lamas for 6 months of dysphagia, weight loss.  MD unable to pass scope beyond an upper esophageal mass CT in 04/2011: esophageal mass just below UES.   05/07/11 direct laryngoscopy, rigid esophagoscopy and savaray dilatation of upper espohageal stricture.  Dr Melony Overly and Dr Oletta Lamas. 2-3 cm below the upper esophageal sphincter, there was a very rigid, tight area and some wall mucosa, which was bleeding a little bit. Next, the guidewire was passed and dilation starting with 8 Savary dilator was dilated up to number 12 without any difficulty. Then, Dr. Oletta Lamas passed the endoscope down to the esophagus down all the way into the stomach. The remaining of more distal esophagus was clear. Stomach was likewise clear of any mucosal abnormalities. Several biopsies were obtained around the strictured area. Pathology: ACUTELY AND CHRONICALLY INFLAMED SQUAMOUS MUCOSA AND UNDERLYING STROMA.  NEGATIVE FOR BARRETT'S MUCOSA.  NEGATIVE FOR NEOPLASM AND MALIGNANCY.  In ED 05/2011 with acute dysphagia, odynophagia, resolved with viscous Lidocaine po.  Advised to follow up with ENT but never did.  Nor did he follow up with GI despite persistent dysphagia.    Pt ran out of all meds at least 6 months ago.  Has ongoing dysphagia and can not eat/swallow much so lost 18 to 20# in 7 months. Chokes when  he tries to swallow food, liquid, pills so input is way down.  Swallows liquids ok, potatoes/green beans "50/50", meats/chicken/rice: "100%" unable to swallow, high rate of dysphagia to pills as well.   Has not used ensure or other supplements.  Some pain when foods get stuck.  He drinks 2 beers and single shot of vodka 2 to 3 days per week.  Also smokes pot.  No hx liver or pancreatic problems.    Developed balance problem and fell on way to bathroom early this AM. Pt could not say is he suffered syncope. He developed headache unrelieved by Ibuprofen (normally does not use ASA/NSAIDs) BP in ED: 170s/102-128.   11/17/15 Head CT: Left occipital CVA.  Old right parietal infarct.  Small vessel microvascular disease.  11/17/15: CT angio neck: No significant carotid or vertebral artery stenosis in the neck.  Minimal atherosclerotic disease at the carotid bifurcation bilaterally. MRI/MRA: left>> right PCA stenosis.  Plan is for TEE to look for embolic source, dopplers to look for DVT. 325 ASA added but antithrombotics yet.   SLP bedside swallow: no s/s aspiration.  Mild risk aspiration.  rec crushed meds with puree.  Mech soft and thin liquids.     Past Medical History  Diagnosis Date  . Hypertension   .  Dysphagia   . Headache(784.0)     history of    Past Surgical History  Procedure Laterality Date  . No past surgeries    . Esophagogastroduodenoscopy  04/29/2011    Procedure: ESOPHAGOGASTRODUODENOSCOPY (EGD);  Surgeon: Winfield Cunas., MD;  Location: [facility];  Service: Endoscopy;  Laterality: N/A;  . Direct laryngoscopy  05/07/2011    Procedure: DIRECT LARYNGOSCOPY;  Surgeon: Rozetta Nunnery, MD;  Location: [facility];  Service: ENT;  Laterality: N/A;  ESOPHAGOSCOPY  WITH DILATION    Prior to Admission medications   Medication Sig Start Date End Date Taking? Authorizing Provider  ibuprofen (ADVIL,MOTRIN) 200 MG tablet Take 400 mg by mouth every 6 (six) hours as needed for moderate  pain.   Yes Historical Provider, MD    Scheduled Meds: . famotidine (PEPCID) IV  20 mg Intravenous Q12H   Infusions: . sodium chloride     PRN Meds: hydrALAZINE, nicotine   Allergies as of 11/16/2015  . (No Known Allergies)    History reviewed. No pertinent family history.  Social History   Social History  . Marital Status: Widowed    Spouse Name: N/A  . Number of Children: N/A  . Years of Education: N/A   Occupational History  . Not on file.   Social History Main Topics  . Smoking status: Current Every Day Smoker -- 0.50 packs/day for 30 years    Types: Cigarettes  . Smokeless tobacco: Not on file  . Alcohol Use: 1.8 oz/week    3 Shots of liquor per week     Comment: occassional  . Drug Use: Yes    Special: Marijuana  . Sexual Activity: Not on file   Other Topics Concern  . Not on file   Social History Narrative    REVIEW OF SYSTEMS: Constitutional:  Weakness, no fatigue ENT:  No nose bleeds Pulm:  No SOB or cough.   CV:  No palpitations, no LE edema.  GU:  No hematuria, no frequency GI:  Per HPI.  No bleeding PR.  stoooling 2 to 3 x per week: brown, formed, no blood Heme:  No issues with excessive bleeding or bruising.    Transfusions:  None ever.   Neuro:  No headaches, no peripheral tingling or numbness Derm:  No itching, no rash or sores.  Endocrine:  No sweats or chills.  No polyuria or dysuria Immunization:  Not queried.  Travel:  None beyond local counties in last few months.    PHYSICAL EXAM: Vital signs in last 24 hours: Filed Vitals:   11/17/15 0530 11/17/15 0730  BP: 162/105 159/97  Pulse: 83 78  Temp: 98 F (36.7 C) 98.5 F (36.9 C)  Resp: 18 16   Wt Readings from Last 3 Encounters:  11/17/15 64.456 kg (142 lb 1.6 oz)  05/07/11 72.576 kg (160 lb)  04/29/11 72.576 kg (160 lb)    General: thin, comfortable, alert Head:  No asymmetry or swelling.  No trauma  Eyes:  No icterus or pallor.  + arcus senilis.  Ears:  Slight HOH    Nose:  No discharge or congestion Mouth:  Moist, clear.  Many absent teeth including incisors and at least 50% of molars Neck:  No mass, no TMG Lungs:  Clear bil.  No SOB or cough Heart: RRR.  No MRG.  S1/S2 present Abdomen:  Soft, NT, ND.  No bruits, masses, hernias or HSM.   Rectal: deferred   Musc/Skeltl: no joint swelling or contractures Extremities:  No CCE  Neurologic:  Oriented x 3.  No tremor.  bil UE and LE strength full.  No tremor.  Skin:  No rash or sores. Psych:  Pleasant, calm.    Intake/Output from previous day:   Intake/Output this shift:    LAB RESULTS:  Recent Labs  11/16/15 2101 11/17/15 0426  WBC 9.5 8.3  HGB 15.3 14.0  HCT 43.9 40.6  PLT 267 278   BMET Lab Results  Component Value Date   NA 139 11/17/2015   NA 140 11/16/2015   NA 140 05/07/2011   K 3.1* 11/17/2015   K 3.7 11/16/2015   K 3.7 05/07/2011   CL 104 11/17/2015   CL 107 11/16/2015   CL 103 05/07/2011   CO2 24 11/17/2015   CO2 22 11/16/2015   CO2 25 05/07/2011   GLUCOSE 114* 11/17/2015   GLUCOSE 106* 11/16/2015   GLUCOSE 72 05/07/2011   BUN 18 11/17/2015   BUN 17 11/16/2015   BUN 8 05/07/2011   CREATININE 0.89 11/17/2015   CREATININE 0.95 11/16/2015   CREATININE 0.85 05/07/2011   CALCIUM 9.3 11/17/2015   CALCIUM 9.9 11/16/2015   CALCIUM 9.7 05/07/2011   LFT  Recent Labs  11/17/15 0426  PROT 8.3*  ALBUMIN 4.2  AST 36  ALT 18  ALKPHOS 69  BILITOT 1.3*   PT/INR No results found for: INR, PROTIME Hepatitis Panel No results for input(s): HEPBSAG, HCVAB, HEPAIGM, HEPBIGM in the last 72 hours. C-Diff No components found for: CDIFF Lipase  No results found for: LIPASE  Drugs of Abuse     Component Value Date/Time   LABOPIA NONE DETECTED 11/16/2015 2330   COCAINSCRNUR NONE DETECTED 11/16/2015 2330   LABBENZ NONE DETECTED 11/16/2015 2330   AMPHETMU NONE DETECTED 11/16/2015 2330   THCU POSITIVE* 11/16/2015 2330   LABBARB NONE DETECTED 11/16/2015 2330      RADIOLOGY STUDIES: Dg Chest 2 View  11/17/2015  CLINICAL DATA:  65 year old male with hypertension EXAM: CHEST  2 VIEW COMPARISON:  Chest radiograph dated 05/07/2011 FINDINGS: The heart size and mediastinal contours are within normal limits. Both lungs are clear. The visualized skeletal structures are unremarkable. IMPRESSION: No active cardiopulmonary disease. Electronically Signed   By: Anner Crete M.D.   On: 11/17/2015 00:45   Ct Head Wo Contrast  11/17/2015  CLINICAL DATA:  Acute onset of lightheadedness. Unsteady gait. High blood pressure. Initial encounter. EXAM: CT HEAD WITHOUT CONTRAST TECHNIQUE: Contiguous axial images were obtained from the base of the skull through the vertex without intravenous contrast. COMPARISON:  CT of the neck performed 05/01/2011 FINDINGS: There is an evolving large acute or subacute infarct involving the left occipital lobe, extending into the posterior aspect of the left temporal lobe. There is mild mass effect, without significant midline shift. No definite hemorrhagic transformation is noted, though there is slight irregularity about the left transverse sinus as it passes posterior to the infarct. Mild cerebellar atrophy is noted. Scattered periventricular and subcortical white matter change likely reflects small vessel ischemic microangiopathy. A chronic infarct is noted at the high right parietal lobe, with associated encephalomalacia. Prominence of the ventricles and sulci reflects mild cortical volume loss. The brainstem and fourth ventricle are within normal limits. There is no evidence of fracture; visualized osseous structures are unremarkable in appearance. The orbits are within normal limits. The paranasal sinuses and mastoid air cells are well-aerated. No significant soft tissue abnormalities are seen. IMPRESSION: 1. Evolving large acute or subacute infarct involving the left occipital  lobe, extending into the posterior aspect of the left temporal lobe.  Mild mass effect, without significant midline shift. No definite hemorrhagic transformation is seen, though there is slight irregularity of the left transverse sinus as it passes posterior to the infarct. 2. Chronic infarct at the high right parietal lobe, with associated encephalomalacia. 3. Mild cortical volume loss and scattered small vessel ischemic microangiopathy. These results were called by telephone at the time of interpretation on 11/17/2015 at 2:05 am to Dr. Veatrice Kells, who verbally acknowledged these results. Electronically Signed   By: Garald Balding M.D.   On: 11/17/2015 02:07   Ct Angio Neck W Or Wo Contrast  11/17/2015  CLINICAL DATA:  Stroke EXAM: CT ANGIOGRAPHY NECK TECHNIQUE: Multidetector CT imaging of the neck was performed using the standard protocol during bolus administration of intravenous contrast. Multiplanar CT image reconstructions and MIPs were obtained to evaluate the vascular anatomy. Carotid stenosis measurements (when applicable) are obtained utilizing NASCET criteria, using the distal internal carotid diameter as the denominator. CONTRAST:  50 mL Isovue 370 IV COMPARISON:  .MRI head 11/17/2015 FINDINGS: Aortic arch: Normal aortic arch.  Proximal great vessels are normal. Right carotid system: Right common carotid artery widely patent. Minimal atherosclerotic calcification of the carotid bifurcation without significant stenosis or dissection Left carotid system: Left common carotid artery widely patent. Mild atherosclerotic calcification of the carotid bifurcation without stenosis. No dissection. Vertebral arteries:Left vertebral dominant. Both vertebral arteries patent without stenosis or dissection. Both vertebral arteries contribute to the basilar. Skeleton: Moderate cervical spine degenerative changes with spondylosis diffusely. No fracture or bone lesion. Other neck: .  Apical blebs.  No apical mass or infiltrate. IMPRESSION: No significant carotid or vertebral artery  stenosis in the neck. Minimal atherosclerotic disease at the carotid bifurcation bilaterally. Electronically Signed   By: Franchot Gallo M.D.   On: 11/17/2015 10:57   Mr Brain Wo Contrast  11/17/2015  CLINICAL DATA:  Stroke EXAM: MRI HEAD WITHOUT CONTRAST MRA HEAD WITHOUT CONTRAST TECHNIQUE: Multiplanar, multiecho pulse sequences of the brain and surrounding structures were obtained without intravenous contrast. Angiographic images of the head were obtained using MRA technique without contrast. COMPARISON:  CT head 11/17/2015 FINDINGS: MRI HEAD FINDINGS Acute left posterior cerebral artery infarct. Restricted diffusion in the left posterior temporal and occipital lobe. Small area of acute infarct in the left thalamus. No associated hemorrhage. No other areas of acute infarct Chronic infarct right parietal cortex. Chronic microvascular ischemic changes throughout the white matter. Chronic infarct left caudate and right thalamus. Slight chronic ischemia left pons. Negative for intracranial hemorrhage. Negative for mass or edema. No shift of the midline structures. Paranasal sinuses clear.  Normal orbit.  Normal pituitary. MRA HEAD FINDINGS Left vertebral artery dominant. Both vertebral arteries contribute to the basilar. Prominent AICA with moderate stenosis of the origin of the left AICA. Basilar widely patent. Superior cerebellar arteries patent bilaterally. Moderate stenosis of the right posterior cerebral artery. Severe stenosis left posterior cerebral artery with better flow distally. This is the area of acute infarct. Cavernous carotid shows mild irregularity. Moderate focal stenosis left cavernous carotid. Right cavernous carotid patent. Anterior and middle cerebral arteries patent bilaterally. There is artifact in the left M1 segment. No large vessel occlusion or flow limiting stenosis. Negative for cerebral aneurysm. IMPRESSION: Acute infarct left PCA territory.  No hemorrhage Extensive chronic ischemic  changes. Severe stenosis left posterior cerebral artery. Moderate stenosis right posterior cerebral artery. Electronically Signed   By: Franchot Gallo M.D.   On:  11/17/2015 07:28   Mr Jodene Nam Head/brain Wo Cm  11/17/2015  CLINICAL DATA:  Stroke EXAM: MRI HEAD WITHOUT CONTRAST MRA HEAD WITHOUT CONTRAST TECHNIQUE: Multiplanar, multiecho pulse sequences of the brain and surrounding structures were obtained without intravenous contrast. Angiographic images of the head were obtained using MRA technique without contrast. COMPARISON:  CT head 11/17/2015 FINDINGS: MRI HEAD FINDINGS Acute left posterior cerebral artery infarct. Restricted diffusion in the left posterior temporal and occipital lobe. Small area of acute infarct in the left thalamus. No associated hemorrhage. No other areas of acute infarct Chronic infarct right parietal cortex. Chronic microvascular ischemic changes throughout the white matter. Chronic infarct left caudate and right thalamus. Slight chronic ischemia left pons. Negative for intracranial hemorrhage. Negative for mass or edema. No shift of the midline structures. Paranasal sinuses clear.  Normal orbit.  Normal pituitary. MRA HEAD FINDINGS Left vertebral artery dominant. Both vertebral arteries contribute to the basilar. Prominent AICA with moderate stenosis of the origin of the left AICA. Basilar widely patent. Superior cerebellar arteries patent bilaterally. Moderate stenosis of the right posterior cerebral artery. Severe stenosis left posterior cerebral artery with better flow distally. This is the area of acute infarct. Cavernous carotid shows mild irregularity. Moderate focal stenosis left cavernous carotid. Right cavernous carotid patent. Anterior and middle cerebral arteries patent bilaterally. There is artifact in the left M1 segment. No large vessel occlusion or flow limiting stenosis. Negative for cerebral aneurysm. IMPRESSION: Acute infarct left PCA territory.  No hemorrhage Extensive  chronic ischemic changes. Severe stenosis left posterior cerebral artery. Moderate stenosis right posterior cerebral artery. Electronically Signed   By: Franchot Gallo M.D.   On: 11/17/2015 07:28    ENDOSCOPIC STUDIES: Per HPI.  No previous colonoscopy.  Mom had colon caner in her 90s.    IMPRESSION:   *  Chronic dysphagia in pt with hx benign upper esophageal sticture dilated by ENT in 04/2011.  Has not followed up with ENT or with GI.  Losing weight as unable to swallow.    *  Acute left PCA territory embolic CVA, source undefined.  CT confirms old CVA as well. Stroke team mentions pursuing TEE but this may not be possible given dysphagia and likely recurrent UES stricture.  I do not see orders for TEE or LE dopplers as mentioned in stroke team note.  His neuro exam for me is benign.    *  Non-compliance with meds and follow ups, ran out of Rx and his PMD died.  He has no insurance and limited funds.    PLAN:     *  ? needs ENT eval for rigid scope and dilation.    *  Full liquids, as I spoke with pt I think some of the foods on D 3 diet may cause issues so lets start with FLs.  Added Ensure BID (cost is a concern so advised home use of Carnation complete with pt and his family). Note that nutrition consult is in progress will defer final supplement decision to them but added Ensure bid as inpt for now.   *  I stopped the IV Pepcid,  Dose he even need this or PPI? Upper esophageal strictures are usually not reflux related.    Azucena Freed  11/17/2015, 1:22 PM Pager: [phone]

## 2015-11-17 NOTE — Progress Notes (Signed)
Initial Nutrition Assessment  DOCUMENTATION CODES:   Severe malnutrition in context of chronic illness  INTERVENTION:  Continue Ensure Enlive po BID, each supplement provides 350 kcal and 20 grams of protein.  Encourage adequate PO intake.   NUTRITION DIAGNOSIS:   Malnutrition related to dysphagia as evidenced by percent weight loss, severe depletion of body fat, severe depletion of muscle mass.  GOAL:   Patient will meet greater than or equal to 90% of their needs  MONITOR:   PO intake, Supplement acceptance, Weight trends, Labs, I & O's  REASON FOR ASSESSMENT:   Malnutrition Screening Tool    ASSESSMENT:   65 y.o. male with a history of hypertension, esopgageal mass, dysphagia as well as previous right parietal stroke, presenting with acute left PCA territory ischemic infarction with right homonymous hemianopsia.  Diet has been advanced to a full liquid diet. Pt reports hunger during time of visit. Pt reports he has been having dysphagia/trouble swallowing for 6 months, thus says he has been eating less at meals. Pt however still consumes 3 meals a day. Pt with weight loss. Usual body weight reported to be ~160 lbs 6 months ago. Pt with a 11% weight loss in 6 months. Pt currently has Ensure ordered. RD to continue with current orders.   Nutrition-Focused physical exam completed. Findings are moderate to severe fat depletion, severe muscle depletion, and no edema.   Labs and medications reviewed.   Diet Order:  Diet full liquid Room service appropriate?: Yes; Fluid consistency:: Thin  Skin:  Reviewed, no issues  Last BM:  6/22  Height:   Ht Readings from Last 1 Encounters:  11/17/15 5\' 11"  (1.803 m)    Weight:   Wt Readings from Last 1 Encounters:  11/17/15 142 lb 1.6 oz (64.456 kg)    Ideal Body Weight:  78 kg  BMI:  Body mass index is 19.83 kg/(m^2).  Estimated Nutritional Needs:   Kcal:  1900-2100  Protein:  85-100 grams  Fluid:  1.9 - 2.1  L/day  EDUCATION NEEDS:   No education needs identified at this time  Corrin Parker, MS, RD, LDN Pager # [phone] After hours/ weekend pager # [phone]

## 2015-11-17 NOTE — Progress Notes (Signed)
PROGRESS NOTE  Gregory Perry [id number] DOB: 10-Nov-1950 DOA: 11/16/2015 PCP: PROVIDER NOT IN SYSTEM  Brief History:  65 year old male with a history of hypertension, tobacco abuse, alcohol abuse presents with one-day history of gait instability and fall. Upon further questioning, the patient did not have loss of consciousness. However, the patient stated that he "did not feel right"the day prior to admission. He denied any dysarthria, focal collection may weakness, loss of vision. Initial CT of the brain revealed evolving acute or subacute infarct in the left occipital lobe. MRI revealed a left PCA infarct. As result, neurology was consulted and full stroke workup was undertaken. In addition, the patient has been complaining of dysphagia and odynophagia for the last 2-3 years. He has had intermittent vomiting because he is not able to swallow food. In addition he has lost 30 pounds in the past 6-12 months. On 04/29/2011, the patient had an EGD performed by Dr. Oletta Lamas which revealed a stricture in the proximal esophagus with a bloody ulcer. There was difficulty advancing the scope at that time.  Old records reviewed and summarized  Assessment/Plan: Left PCA stroke -Appreciate Neurology Consult -PT/OT evaluation -Speech therapy eval -CT brain--evolving acute or subacute left occipital lobe infarct -MRI-acute left PCA infarct -MRA brain---severe stenosis left posterior cerebral artery, artery stenosis right posterior cerebral artery -CTA neck--no significant carotid or vertebral stenosis -Echo--pending -LDL--136 -HbA1C--pending -Plan noted for possible loop recorder  Hypertension -Allow for permissive hypertension, then gradually normalize over the next 5-7 days -hydralazine for SBP >220 or DBP >120  Alcohol and tobacco abuse -CIWA -NicoDerm patch -Cessation discussed -Last drink 4 days prior to admission -Patient drinks 2 beers with shot of vodka  daily  Dysphagia -consult GI -dysphagia 3 diet  Tachycardia -Patient had 3-4 episodes of tachycardia up to 140 fasting one to 2 minutes -Patient was asymptomatic -Regular telemetry suggest possible atrial tachycardia -Plan to start low-dose beta blocker after allowing for permissive hypertension for the first 24 hours -loop recorder planned    Disposition Plan:   Home in 1-2 days  Family Communication:   Sister update at bedside--total time 35 min; >50% spent counseling and coordinating care [phone]   Consultants:  Neurology, cardiology, [facility]  Code Status:  FULL  DVT Prophylaxis:  [place] Heparin   Procedures: As Listed in Progress Note Above  Antibiotics: None    Subjective: Patient continues to complain of dysphagia and odynophagia. Denies any fevers, chills, chest pain, nausea,  Vomiting or diarrhea, abdominal pain. Denies any headache or visual disturbance. Denies any focal weakness. No dysarthria, numbness or tingling.  Objective: Filed Vitals:   11/17/15 0415 11/17/15 0430 11/17/15 0530 11/17/15 0730  BP:  141/82 162/105 159/97  Pulse: 73 91 83 78  Temp:   98 F (36.7 C) 98.5 F (36.9 C)  TempSrc:    Oral  Resp: 17 17 18 16   Height:   5\' 11"  (1.803 m)   Weight:   64.456 kg (142 lb 1.6 oz)   SpO2: 100% 100% 100% 100%   No intake or output data in the 24 hours ending 11/17/15 1225 Weight change:  Exam:   General:  Pt is alert, follows commands appropriately, not in acute distress  HEENT: No icterus, No thrush, No neck mass, [place]/AT  Cardiovascular: RRR, S1/S2, no rubs, no gallops  Respiratory: Diminished breath sounds at the bases but with auscultation. No wheezing.  Abdomen: Soft/+BS, non tender, non distended, no guarding  Extremities: No edema, No lymphangitis, No petechiae, No rashes, no synovitis   Data Reviewed: I have personally reviewed following labs and imaging studies Basic Metabolic Panel:  Recent Labs Lab 11/16/15 2101  11/17/15 0426  NA 140 139  K 3.7 3.1*  CL 107 104  CO2 22 24  GLUCOSE 106* 114*  BUN 17 18  CREATININE 0.95 0.89  CALCIUM 9.9 9.3   Liver Function Tests:  Recent Labs Lab 11/17/15 0426  AST 36  ALT 18  ALKPHOS 69  BILITOT 1.3*  PROT 8.3*  ALBUMIN 4.2   No results for input(s): LIPASE, AMYLASE in the last 168 hours. No results for input(s): AMMONIA in the last 168 hours. Coagulation Profile: No results for input(s): INR, PROTIME in the last 168 hours. CBC:  Recent Labs Lab 11/16/15 2101 11/17/15 0426  WBC 9.5 8.3  NEUTROABS  --  5.7  HGB 15.3 14.0  HCT 43.9 40.6  MCV 88.5 88.1  PLT 267 278   Cardiac Enzymes: No results for input(s): CKTOTAL, CKMB, CKMBINDEX, TROPONINI in the last 168 hours. BNP: Invalid input(s): POCBNP CBG:  Recent Labs Lab 11/16/15 2346  GLUCAP 96   HbA1C: No results for input(s): HGBA1C in the last 72 hours. Urine analysis:    Component Value Date/Time   COLORURINE AMBER* 11/16/2015 2330   APPEARANCEUR CLOUDY* 11/16/2015 2330   LABSPEC 1.032* 11/16/2015 2330   PHURINE 5.5 11/16/2015 2330   GLUCOSEU NEGATIVE 11/16/2015 2330   HGBUR MODERATE* 11/16/2015 2330   BILIRUBINUR MODERATE* 11/16/2015 2330   KETONESUR 40* 11/16/2015 2330   PROTEINUR 100* 11/16/2015 2330   NITRITE NEGATIVE 11/16/2015 2330   LEUKOCYTESUR NEGATIVE 11/16/2015 2330   Sepsis Labs: @LABRCNTIP (procalcitonin:4,lacticidven:4) )No results found for this or any previous visit (from the past 240 hour(s)).   Scheduled Meds: . famotidine (PEPCID) IV  20 mg Intravenous Q12H  . potassium chloride  10 mEq Intravenous Q1 Hr x 4   Continuous Infusions: . sodium chloride      Procedures/Studies: Dg Chest 2 View  11/17/2015  CLINICAL DATA:  65 year old male with hypertension EXAM: CHEST  2 VIEW COMPARISON:  Chest radiograph dated 05/07/2011 FINDINGS: The heart size and mediastinal contours are within normal limits. Both lungs are clear. The visualized skeletal  structures are unremarkable. IMPRESSION: No active cardiopulmonary disease. Electronically Signed   By: Anner Crete M.D.   On: 11/17/2015 00:45   Ct Head Wo Contrast  11/17/2015  CLINICAL DATA:  Acute onset of lightheadedness. Unsteady gait. High blood pressure. Initial encounter. EXAM: CT HEAD WITHOUT CONTRAST TECHNIQUE: Contiguous axial images were obtained from the base of the skull through the vertex without intravenous contrast. COMPARISON:  CT of the neck performed 05/01/2011 FINDINGS: There is an evolving large acute or subacute infarct involving the left occipital lobe, extending into the posterior aspect of the left temporal lobe. There is mild mass effect, without significant midline shift. No definite hemorrhagic transformation is noted, though there is slight irregularity about the left transverse sinus as it passes posterior to the infarct. Mild cerebellar atrophy is noted. Scattered periventricular and subcortical white matter change likely reflects small vessel ischemic microangiopathy. A chronic infarct is noted at the high right parietal lobe, with associated encephalomalacia. Prominence of the ventricles and sulci reflects mild cortical volume loss. The brainstem and fourth ventricle are within normal limits. There is no evidence of fracture; visualized osseous structures are unremarkable in appearance. The orbits are within normal limits. The paranasal sinuses and mastoid air cells are well-aerated.  No significant soft tissue abnormalities are seen. IMPRESSION: 1. Evolving large acute or subacute infarct involving the left occipital lobe, extending into the posterior aspect of the left temporal lobe. Mild mass effect, without significant midline shift. No definite hemorrhagic transformation is seen, though there is slight irregularity of the left transverse sinus as it passes posterior to the infarct. 2. Chronic infarct at the high right parietal lobe, with associated encephalomalacia. 3.  Mild cortical volume loss and scattered small vessel ischemic microangiopathy. These results were called by telephone at the time of interpretation on 11/17/2015 at 2:05 am to Dr. Veatrice Kells, who verbally acknowledged these results. Electronically Signed   By: Garald Balding M.D.   On: 11/17/2015 02:07   Ct Angio Neck W Or Wo Contrast  11/17/2015  CLINICAL DATA:  Stroke EXAM: CT ANGIOGRAPHY NECK TECHNIQUE: Multidetector CT imaging of the neck was performed using the standard protocol during bolus administration of intravenous contrast. Multiplanar CT image reconstructions and MIPs were obtained to evaluate the vascular anatomy. Carotid stenosis measurements (when applicable) are obtained utilizing NASCET criteria, using the distal internal carotid diameter as the denominator. CONTRAST:  50 mL Isovue 370 IV COMPARISON:  .MRI head 11/17/2015 FINDINGS: Aortic arch: Normal aortic arch.  Proximal great vessels are normal. Right carotid system: Right common carotid artery widely patent. Minimal atherosclerotic calcification of the carotid bifurcation without significant stenosis or dissection Left carotid system: Left common carotid artery widely patent. Mild atherosclerotic calcification of the carotid bifurcation without stenosis. No dissection. Vertebral arteries:Left vertebral dominant. Both vertebral arteries patent without stenosis or dissection. Both vertebral arteries contribute to the basilar. Skeleton: Moderate cervical spine degenerative changes with spondylosis diffusely. No fracture or bone lesion. Other neck: .  Apical blebs.  No apical mass or infiltrate. IMPRESSION: No significant carotid or vertebral artery stenosis in the neck. Minimal atherosclerotic disease at the carotid bifurcation bilaterally. Electronically Signed   By: Franchot Gallo M.D.   On: 11/17/2015 10:57   Mr Brain Wo Contrast  11/17/2015  CLINICAL DATA:  Stroke EXAM: MRI HEAD WITHOUT CONTRAST MRA HEAD WITHOUT CONTRAST TECHNIQUE:  Multiplanar, multiecho pulse sequences of the brain and surrounding structures were obtained without intravenous contrast. Angiographic images of the head were obtained using MRA technique without contrast. COMPARISON:  CT head 11/17/2015 FINDINGS: MRI HEAD FINDINGS Acute left posterior cerebral artery infarct. Restricted diffusion in the left posterior temporal and occipital lobe. Small area of acute infarct in the left thalamus. No associated hemorrhage. No other areas of acute infarct Chronic infarct right parietal cortex. Chronic microvascular ischemic changes throughout the white matter. Chronic infarct left caudate and right thalamus. Slight chronic ischemia left pons. Negative for intracranial hemorrhage. Negative for mass or edema. No shift of the midline structures. Paranasal sinuses clear.  Normal orbit.  Normal pituitary. MRA HEAD FINDINGS Left vertebral artery dominant. Both vertebral arteries contribute to the basilar. Prominent AICA with moderate stenosis of the origin of the left AICA. Basilar widely patent. Superior cerebellar arteries patent bilaterally. Moderate stenosis of the right posterior cerebral artery. Severe stenosis left posterior cerebral artery with better flow distally. This is the area of acute infarct. Cavernous carotid shows mild irregularity. Moderate focal stenosis left cavernous carotid. Right cavernous carotid patent. Anterior and middle cerebral arteries patent bilaterally. There is artifact in the left M1 segment. No large vessel occlusion or flow limiting stenosis. Negative for cerebral aneurysm. IMPRESSION: Acute infarct left PCA territory.  No hemorrhage Extensive chronic ischemic changes. Severe stenosis left posterior cerebral artery.  Moderate stenosis right posterior cerebral artery. Electronically Signed   By: Franchot Gallo M.D.   On: 11/17/2015 07:28   Mr Jodene Nam Head/brain Wo Cm  11/17/2015  CLINICAL DATA:  Stroke EXAM: MRI HEAD WITHOUT CONTRAST MRA HEAD WITHOUT  CONTRAST TECHNIQUE: Multiplanar, multiecho pulse sequences of the brain and surrounding structures were obtained without intravenous contrast. Angiographic images of the head were obtained using MRA technique without contrast. COMPARISON:  CT head 11/17/2015 FINDINGS: MRI HEAD FINDINGS Acute left posterior cerebral artery infarct. Restricted diffusion in the left posterior temporal and occipital lobe. Small area of acute infarct in the left thalamus. No associated hemorrhage. No other areas of acute infarct Chronic infarct right parietal cortex. Chronic microvascular ischemic changes throughout the white matter. Chronic infarct left caudate and right thalamus. Slight chronic ischemia left pons. Negative for intracranial hemorrhage. Negative for mass or edema. No shift of the midline structures. Paranasal sinuses clear.  Normal orbit.  Normal pituitary. MRA HEAD FINDINGS Left vertebral artery dominant. Both vertebral arteries contribute to the basilar. Prominent AICA with moderate stenosis of the origin of the left AICA. Basilar widely patent. Superior cerebellar arteries patent bilaterally. Moderate stenosis of the right posterior cerebral artery. Severe stenosis left posterior cerebral artery with better flow distally. This is the area of acute infarct. Cavernous carotid shows mild irregularity. Moderate focal stenosis left cavernous carotid. Right cavernous carotid patent. Anterior and middle cerebral arteries patent bilaterally. There is artifact in the left M1 segment. No large vessel occlusion or flow limiting stenosis. Negative for cerebral aneurysm. IMPRESSION: Acute infarct left PCA territory.  No hemorrhage Extensive chronic ischemic changes. Severe stenosis left posterior cerebral artery. Moderate stenosis right posterior cerebral artery. Electronically Signed   By: Franchot Gallo M.D.   On: 11/17/2015 07:28    Stefano Trulson, DO  [facility] Pager [phone]  If 7PM-7AM, please contact  night-coverage [email or web address] Password [facility] 11/17/2015, 12:25 PM   LOS: 0 days

## 2015-11-17 NOTE — Interval H&P Note (Signed)
History and Physical Interval Note:  11/17/2015 3:49 PM  Gregory Perry  has presented today for surgery, with the diagnosis of stroke  The various methods of treatment have been discussed with the patient and family. After consideration of risks, benefits and other options for treatment, the patient has consented to  Procedure(s): Loop Recorder Insertion (N/A) as a surgical intervention .  The patient's history has been reviewed, patient examined, no change in status, stable for surgery.  I have reviewed the patient's chart and labs.  Questions were answered to the patient's satisfaction.     Cristopher Peru

## 2015-11-17 NOTE — Consult Note (Signed)
ELECTROPHYSIOLOGY CONSULT NOTE  Patient ID: Gregory Perry MRN: [id number], DOB/AGE: [birth year]   Admit date: 11/16/2015 Date of Consult: 11/17/2015  Primary Physician: PROVIDER NOT [place] Primary Cardiologist: new to [facility] Reason for Consultation: Cryptogenic stroke; recommendations regarding Implantable Loop Recorder  History of Present Illness Gregory Perry was admitted on 11/16/2015 with pre-syncope and a fall.  They first developed symptoms while at home the evening of admission.  Imaging demonstrated acute L PCA infarct felt to be embolic 2/2 unknown source.  Stroke work up is ongoing. The patient has been monitored on telemetry which has demonstrated sinus rhythm with no arrhythmias.  TEE was planned, but patient has trouble swallowing pills and underwent attempted esophageal dilation without success per his report.   Echocardiogram this admission is pending.  Lab work is reviewed.  Prior to admission, the patient denies chest pain, shortness of breath, palpitations, or syncope.  He is able to push mow his lawn without chest pain or shortness of breath or chest pain.  He does have some dizziness with standing too quickly.   EP has been asked to evaluate for placement of an implantable loop recorder to monitor for atrial fibrillation.   Past Medical History  Diagnosis Date  . Hypertension   . Dysphagia   . Headache(784.0)     history of     Surgical History:  Past Surgical History  Procedure Laterality Date  . No past surgeries    . Esophagogastroduodenoscopy  04/29/2011    Procedure: ESOPHAGOGASTRODUODENOSCOPY (EGD);  Surgeon: Winfield Cunas., MD;  Location: [facility];  Service: Endoscopy;  Laterality: N/A;  . Direct laryngoscopy  05/07/2011    Procedure: DIRECT LARYNGOSCOPY;  Surgeon: Rozetta Nunnery, MD;  Location: [facility];  Service: ENT;  Laterality: N/A;  ESOPHAGOSCOPY  WITH DILATION     Prescriptions prior to admission  Medication Sig  Dispense Refill Last Dose  . ibuprofen (ADVIL,MOTRIN) 200 MG tablet Take 400 mg by mouth every 6 (six) hours as needed for moderate pain.   11/16/2015 at Unknown time    Inpatient Medications:  . famotidine (PEPCID) IV  20 mg Intravenous Q12H  . iopamidol      . potassium chloride  10 mEq Intravenous Q1 Hr x 4    Allergies: No Known Allergies  Social History   Social History  . Marital Status: Widowed    Spouse Name: N/A  . Number of Children: N/A  . Years of Education: N/A   Occupational History  . Not on file.   Social History Main Topics  . Smoking status: Current Every Day Smoker -- 0.50 packs/day for 30 years    Types: Cigarettes  . Smokeless tobacco: Not on file  . Alcohol Use: 1.8 oz/week    3 Shots of liquor per week     Comment: occassional  . Drug Use: Yes    Special: Marijuana  . Sexual Activity: Not on file   Other Topics Concern  . Not on file   Social History Narrative     Family History: no premature CAD   Review of Systems: All other systems reviewed and are otherwise negative except as noted above.  Physical Exam: Filed Vitals:   11/17/15 0415 11/17/15 0430 11/17/15 0530 11/17/15 0730  BP:  141/82 162/105 159/97  Pulse: 73 91 83 78  Temp:   98 F (36.7 C) 98.5 F (36.9 C)  TempSrc:    Oral  Resp: 17 17 18 16   Height:  5\' 11"  (1.803 m)   Weight:   142 lb 1.6 oz (64.456 kg)   SpO2: 100% 100% 100% 100%    GEN- The patient is elderly and thin appearing, alert and oriented x 3 today.   Head- normocephalic, atraumatic, poor dentition Eyes-  Sclera clear, conjunctiva pink Ears- hearing intact Oropharynx- clear Neck- supple Lungs- Clear to ausculation bilaterally, normal work of breathing Heart- Regular rate and rhythm, no murmurs, rubs or gallops  GI- soft, NT, ND, + BS Extremities- no clubbing, cyanosis, or edema MS- no significant deformity or atrophy Skin- no rash or lesion Psych- euthymic mood, full affect   Labs:   Lab  Results  Component Value Date   WBC 8.3 11/17/2015   HGB 14.0 11/17/2015   HCT 40.6 11/17/2015   MCV 88.1 11/17/2015   PLT 278 11/17/2015    Recent Labs Lab 11/17/15 0426  NA 139  K 3.1*  CL 104  CO2 24  BUN 18  CREATININE 0.89  CALCIUM 9.3  PROT 8.3*  BILITOT 1.3*  ALKPHOS 69  ALT 18  AST 36  GLUCOSE 114*    Radiology/Studies: Dg Chest 2 View 11/17/2015  CLINICAL DATA:  65 year old male with hypertension EXAM: CHEST  2 VIEW COMPARISON:  Chest radiograph dated 05/07/2011 FINDINGS: The heart size and mediastinal contours are within normal limits. Both lungs are clear. The visualized skeletal structures are unremarkable. IMPRESSION: No active cardiopulmonary disease. Electronically Signed   By: Anner Crete M.D.   On: 11/17/2015 00:45   Ct Head Wo Contrast 11/17/2015  CLINICAL DATA:  Acute onset of lightheadedness. Unsteady gait. High blood pressure. Initial encounter. EXAM: CT HEAD WITHOUT CONTRAST TECHNIQUE: Contiguous axial images were obtained from the base of the skull through the vertex without intravenous contrast. COMPARISON:  CT of the neck performed 05/01/2011 FINDINGS: There is an evolving large acute or subacute infarct involving the left occipital lobe, extending into the posterior aspect of the left temporal lobe. There is mild mass effect, without significant midline shift. No definite hemorrhagic transformation is noted, though there is slight irregularity about the left transverse sinus as it passes posterior to the infarct. Mild cerebellar atrophy is noted. Scattered periventricular and subcortical white matter change likely reflects small vessel ischemic microangiopathy. A chronic infarct is noted at the high right parietal lobe, with associated encephalomalacia. Prominence of the ventricles and sulci reflects mild cortical volume loss. The brainstem and fourth ventricle are within normal limits. There is no evidence of fracture; visualized osseous structures are  unremarkable in appearance. The orbits are within normal limits. The paranasal sinuses and mastoid air cells are well-aerated. No significant soft tissue abnormalities are seen. IMPRESSION: 1. Evolving large acute or subacute infarct involving the left occipital lobe, extending into the posterior aspect of the left temporal lobe. Mild mass effect, without significant midline shift. No definite hemorrhagic transformation is seen, though there is slight irregularity of the left transverse sinus as it passes posterior to the infarct. 2. Chronic infarct at the high right parietal lobe, with associated encephalomalacia. 3. Mild cortical volume loss and scattered small vessel ischemic microangiopathy. These results were called by telephone at the time of interpretation on 11/17/2015 at 2:05 am to Dr. Veatrice Kells, who verbally acknowledged these results. Electronically Signed   By: Garald Balding M.D.   On: 11/17/2015 02:07   Mr Brain Wo Contrast 11/17/2015  CLINICAL DATA:  Stroke EXAM: MRI HEAD WITHOUT CONTRAST MRA HEAD WITHOUT CONTRAST TECHNIQUE: Multiplanar, multiecho pulse sequences of the  brain and surrounding structures were obtained without intravenous contrast. Angiographic images of the head were obtained using MRA technique without contrast. COMPARISON:  CT head 11/17/2015 FINDINGS: MRI HEAD FINDINGS Acute left posterior cerebral artery infarct. Restricted diffusion in the left posterior temporal and occipital lobe. Small area of acute infarct in the left thalamus. No associated hemorrhage. No other areas of acute infarct Chronic infarct right parietal cortex. Chronic microvascular ischemic changes throughout the white matter. Chronic infarct left caudate and right thalamus. Slight chronic ischemia left pons. Negative for intracranial hemorrhage. Negative for mass or edema. No shift of the midline structures. Paranasal sinuses clear.  Normal orbit.  Normal pituitary. MRA HEAD FINDINGS Left vertebral artery  dominant. Both vertebral arteries contribute to the basilar. Prominent AICA with moderate stenosis of the origin of the left AICA. Basilar widely patent. Superior cerebellar arteries patent bilaterally. Moderate stenosis of the right posterior cerebral artery. Severe stenosis left posterior cerebral artery with better flow distally. This is the area of acute infarct. Cavernous carotid shows mild irregularity. Moderate focal stenosis left cavernous carotid. Right cavernous carotid patent. Anterior and middle cerebral arteries patent bilaterally. There is artifact in the left M1 segment. No large vessel occlusion or flow limiting stenosis. Negative for cerebral aneurysm. IMPRESSION: Acute infarct left PCA territory.  No hemorrhage Extensive chronic ischemic changes. Severe stenosis left posterior cerebral artery. Moderate stenosis right posterior cerebral artery. Electronically Signed   By: Franchot Gallo M.D.   On: 11/17/2015 07:28   12-lead ECG sinus tach, rate 100, LVH  All prior EKG's in EPIC reviewed with no documented atrial fibrillation  Telemetry sinus rhythm/sinus tach   Assessment and Plan:  1. Cryptogenic stroke The patient presents with cryptogenic stroke.  TEE was planned, but with patient's trouble with swallowing pills and history of food "sticking", we have cancelled after discussion with Dr Johnsie Cancel. Will plan bubble study with 2-D echo. If TEE needed after that study review, will need barium swallow prior to TEE.  I spoke at length with the patient about monitoring for afib with an implantable loop recorder.  Risks, benefits, and alteratives to implantable loop recorder were discussed with the patient today.   At this time, the patient is very clear in their decision to proceed with implantable loop recorder.  I discussed the option of coming back electively for ILR implant once insurance was obtained. The patient and his sister are very clear that they would like to proceed at this time.   They are aware of monthly monitoring charges.  Will plan ILR implant later today after 2-D echo obtained and reviewed.   Wound care was reviewed with the patient (keep incision clean and dry for 3 days).    Please call with questions.   Chanetta Marshall, NP 11/17/2015 9:55 AM   EP Attending  Patient seen and examined. Agree with above. I have reviewed the findings as noted above and agree. ON exam, he has a RRR, lungs are clear and no edema. Tele - NSR.  A/P 1. Cryptogenic stroke - I have discussed the risks/benefits/goals/expectations of ILR insertion and she wishes to proceed.  Mikle Bosworth.D.

## 2015-11-17 NOTE — Progress Notes (Signed)
  Echocardiogram 2D Echocardiogram has been performed.  Gregory Perry 11/17/2015, 3:27 PM

## 2015-11-17 NOTE — Progress Notes (Signed)
Telemetry notified of patient HR being tachycardic. Notified MD, Dr. Carles Collet. Wendee Copp

## 2015-11-17 NOTE — Progress Notes (Addendum)
Occupational Therapy Evaluation Patient Details Name: Gregory Perry MRN: [id number] DOB: Jan 01, 1951 Today's Date: 11/17/2015    History of Present Illness 65 y.o. male admitted for near syncope, fall and HTN. MRI (+) acute/subacute large L PCA territory infarct and remote parietal infarct. PMH significant for HTN, dysphagia, and    Clinical Impression   PTA, pt was independent with ADLs and mobility. Pt currently presents with R homonymous hemianopsia, balance deficits, and some impulsivity. Pt with multiple episodes of tachycardia (HR 133-146) during session although pt was asymptomatic - RN notified. Pt plans to d/c home with intermittent assistance from his sister. Pt will benefit from continued acute OT to increase independence and safety with ADLs and mobility to allow for safe discharge home. Recommend Neuro Outpatient OT upon discharge. Will continue to follow acutely.    Follow Up Recommendations  Outpatient OT;Supervision - Intermittent    Equipment Recommendations  None recommended by OT    Recommendations for Other Services       Precautions / Restrictions Precautions Precautions: Fall Precaution Comments: R homonymous heminanopsia Restrictions Weight Bearing Restrictions: No      Mobility Bed Mobility Overal bed mobility: Modified Independent             General bed mobility comments: HOB flat, no use of bedrails  Transfers Overall transfer level: Needs assistance Equipment used: None Transfers: Sit to/from Stand Sit to Stand: Supervision         General transfer comment: Supervision for safety and balance. Pt impulsive at times and requires cues to slow down and maintain safety at all times.    Balance Overall balance assessment: Needs assistance Sitting-balance support: Feet supported;No upper extremity supported Sitting balance-Leahy Scale: Normal     Standing balance support: No upper extremity supported;During functional  activity Standing balance-Leahy Scale: Good                              ADL Overall ADL's : Needs assistance/impaired     Grooming: Wash/dry hands;Oral care;Supervision/safety;Standing                   Toilet Transfer: Supervision/safety;Cueing for safety;Ambulation;Regular Toilet   Toileting- Water quality scientist and Hygiene: Supervision/safety;Sit to/from stand       Functional mobility during ADLs: Supervision/safety General ADL Comments: Cues for safety - impulsive at times     Vision Vision Assessment?: Yes Eye Alignment: Within Functional Limits Ocular Range of Motion: Within Functional Limits Alignment/Gaze Preference: Within Defined Limits Tracking/Visual Pursuits: Able to track stimulus in all quads without difficulty Saccades: Decreased speed of saccadic movement Convergence: Within functional limits Visual Fields: Right homonymous hemianopsia Depth Perception: Overshoots Additional Comments: Will need to further assess if only hemianopsia or if inattention also present   Perception Perception Perception Tested?: Yes Perception Deficits: Inattention/neglect Inattention/Neglect: Impaired- to be further tested in functional context Comments: Difficult to tell if inattention present with hemianopsia - pt with multiple episodes of tachycardia during session will need to assess further   Praxis Praxis Praxis tested?: Within functional limits    Pertinent Vitals/Pain Pain Assessment: 0-10 Pain Score: 3  Pain Location: headache, muscle cramps in bilateral biceps Pain Descriptors / Indicators: Cramping;Headache Pain Intervention(s): Monitored during session;Repositioned     Hand Dominance Left   Extremity/Trunk Assessment Upper Extremity Assessment Upper Extremity Assessment: Overall WFL for tasks assessed   Lower Extremity Assessment Lower Extremity Assessment: Defer to PT evaluation   Cervical / Trunk Assessment  Cervical / Trunk  Assessment: Normal   Communication Communication Communication: No difficulties   Cognition Arousal/Alertness: Awake/alert Behavior During Therapy: WFL for tasks assessed/performed Overall Cognitive Status: Within Functional Limits for tasks assessed                     General Comments       Exercises       Shoulder Instructions      Home Living Family/patient expects to be discharged to:: Private residence Living Arrangements: Other relatives (sister) Available Help at Discharge: Family;Available PRN/intermittently (sister works during the day) Type of Home: House Home Access: Stairs to enter Technical brewer of Steps: 4 Entrance Stairs-Rails: None Home Layout: One level     Bathroom Shower/Tub: Tub/shower unit Shower/tub characteristics: Architectural technologist: Standard     Home Equipment: Radio producer - single point          Prior Functioning/Environment Level of Independence: Independent        Comments: Retired Theatre manager Diagnosis: Disturbance of vision;Acute pain   OT Problem List: Impaired balance (sitting and/or standing);Decreased safety awareness;Impaired vision/perception;Decreased knowledge of use of DME or AE;Cardiopulmonary status limiting activity;Pain   OT Treatment/Interventions: Self-care/ADL training;Therapeutic exercise;Energy conservation;DME and/or AE instruction;Therapeutic activities;Visual/perceptual remediation/compensation;Patient/family education;Balance training    OT Goals(Current goals can be found in the care plan section) Acute Rehab OT Goals Patient Stated Goal: to go home OT Goal Formulation: With patient Time For Goal Achievement: 12/01/15 Potential to Achieve Goals: Good ADL Goals Pt Will Perform Tub/Shower Transfer: Tub transfer;with modified independence;ambulating Additional ADL Goal #1: Pt will independently incorporate visual compensatory strategies into daily tasks with min verbal cues.  OT  Frequency: Min 3X/week   Barriers to D/C:            Co-evaluation              End of Session Equipment Utilized During Treatment: Gait belt Nurse Communication: Mobility status;Other (comment) (HR/BP measures during session)  Activity Tolerance: Patient tolerated treatment well Patient left: in bed;with call bell/phone within reach;Other (comment) (transport taking pt to CT)   Time: [id number] OT Time Calculation (min): 39 min Charges:  OT General Charges $OT Visit: 1 Procedure OT Evaluation $OT Eval Moderate Complexity: 1 Procedure OT Treatments $Self Care/Home Management : 23-37 mins G-Codes:    Redmond Baseman, OTR/L Pager: [phone] 11/17/2015, 12:05 PM

## 2015-11-17 NOTE — ED Notes (Signed)
Pt ambulated without assistance. Pt was steady on his feet 

## 2015-11-17 NOTE — Consult Note (Signed)
Admission H&P    Chief Complaint: Headache, lightheadedness and a physical fall.  HPI: Gregory Perry is an 65 y.o. male with a history of headache and noncompliance with treatment it came to the ED at [facility] with a complaint of lightheadedness and a fall at home earlier. There was no loss of consciousness. Headache was primarily frontal. Blood pressure was elevated at 178/114. He had no change in speech and no facial droop was described. He did not experience motor or sensory abnormalities involving extremities. He also had no visual changes. CT scan of his brain, however, showed an evolving acute to subacute large left PCA territory ischemic infarction. An old right parietal infarction with encephalomalacia was also noted. NIH stroke score was the time of this evaluation was 3.  LSN: 11/16/2015, unclear time tPA Given: No: Unclear when last known well mRankin:  Past Medical History  Diagnosis Date  . Hypertension   . Dysphagia   . Headache(784.0)     history of    Past Surgical History  Procedure Laterality Date  . No past surgeries    . Esophagogastroduodenoscopy  04/29/2011    Procedure: ESOPHAGOGASTRODUODENOSCOPY (EGD);  Surgeon: Vertell Novak., MD;  Location: [facility];  Service: Endoscopy;  Laterality: N/A;  . Direct laryngoscopy  05/07/2011    Procedure: DIRECT LARYNGOSCOPY;  Surgeon: Drema Halon, MD;  Location: [facility] OR;  Service: ENT;  Laterality: N/A;  ESOPHAGOSCOPY  WITH DILATION    History reviewed. No pertinent family history. Social History:  reports that he has been smoking Cigarettes.  He has a 15 pack-year smoking history. He does not have any smokeless tobacco history on file. He reports that he drinks about 1.8 oz of alcohol per week. He reports that he uses illicit drugs (Marijuana).  Allergies: No Known Allergies  Medications Prior to Admission  Medication Sig Dispense Refill  . ibuprofen (ADVIL,MOTRIN) 200 MG tablet Take 400 mg by  mouth every 6 (six) hours as needed for moderate pain.      ROS: History obtained from the patient  General ROS: negative for - chills, fatigue, fever, night sweats, weight gain or weight loss Psychological ROS: negative for - behavioral disorder, hallucinations, memory difficulties, mood swings or suicidal ideation Ophthalmic ROS: negative for - blurry vision, double vision, eye pain or loss of vision ENT ROS: negative for - epistaxis, nasal discharge, oral lesions, sore throat, tinnitus or vertigo Allergy and Immunology ROS: negative for - hives or itchy/watery eyes Hematological and Lymphatic ROS: negative for - bleeding problems, bruising or swollen lymph nodes Endocrine ROS: negative for - galactorrhea, hair pattern changes, polydipsia/polyuria or temperature intolerance Respiratory ROS: negative for - cough, hemoptysis, shortness of breath or wheezing Cardiovascular ROS: negative for - chest pain, dyspnea on exertion, edema or irregular heartbeat Gastrointestinal ROS: negative for - abdominal pain, diarrhea, hematemesis, nausea/vomiting or stool incontinence Genito-Urinary ROS: negative for - dysuria, hematuria, incontinence or urinary frequency/urgency Musculoskeletal ROS: negative for - joint swelling or muscular weakness Neurological ROS: as noted in HPI Dermatological ROS: negative for rash and skin lesion changes  Physical Examination: Blood pressure 162/105, pulse 83, temperature 98.6 F (37 C), resp. rate 18, height 5\' 11"  (1.803 m), weight 64.456 kg (142 lb 1.6 oz), SpO2 100 %.  HEENT-  Normocephalic, no lesions, without obvious abnormality.  Normal external eye and conjunctiva.  Normal TM's bilaterally.  Normal auditory canals and external ears. Normal external nose, mucus membranes and septum.  Normal pharynx. Neck supple with  no masses, nodes, nodules or enlargement. Cardiovascular - regular rate and rhythm, S1, S2 normal, no murmur, click, rub or gallop Lungs - chest  clear, no wheezing, rales, normal symmetric air entry Abdomen - soft, non-tender; bowel sounds normal; no masses,  no organomegaly Extremities - no joint deformities, effusion, or inflammation and no edema  Neurologic Examination: Mental Status: Alert, oriented except for his current age, no acute distress.  Speech fluent without evidence of aphasia. Able to follow commands without difficulty. Cranial Nerves: II-dense right homonymous hemianopsia. III/IV/VI-Pupils were equal and reacted normally to light. Extraocular movements were full and conjugate.    V/VII-no facial numbness and no facial weakness. VIII-normal. X-normal speech and symmetrical palatal movement. XI: trapezius strength/neck flexion strength normal bilaterally XII-midline tongue extension with normal strength. Motor: 5/5 bilaterally with normal tone and bulk Sensory: Normal throughout. Deep Tendon Reflexes: 1+ and symmetric. Plantars: Mute bilaterally Cerebellar: Normal finger-to-nose testing. Carotid auscultation: Normal  Results for orders placed or performed during the hospital encounter of 11/16/15 (from the past 48 hour(s))  Basic metabolic panel     Status: Abnormal   Collection Time: 11/16/15  9:01 PM  Result Value Ref Range   Sodium 140 135 - 145 mmol/L   Potassium 3.7 3.5 - 5.1 mmol/L   Chloride 107 101 - 111 mmol/L   CO2 22 22 - 32 mmol/L   Glucose, Bld 106 (H) 65 - 99 mg/dL   BUN 17 6 - 20 mg/dL   Creatinine, Ser 0.95 0.61 - 1.24 mg/dL   Calcium 9.9 8.9 - 10.3 mg/dL   GFR calc non Af Amer >60 >60 mL/min   GFR calc Af Amer >60 >60 mL/min    Comment: (NOTE) The eGFR has been calculated using the CKD EPI equation. This calculation has not been validated in all clinical situations. eGFR's persistently <60 mL/min signify possible Chronic Kidney Disease.    Anion gap 11 5 - 15  CBC     Status: None   Collection Time: 11/16/15  9:01 PM  Result Value Ref Range   WBC 9.5 4.0 - 10.5 K/uL   RBC 4.96  4.22 - 5.81 MIL/uL   Hemoglobin 15.3 13.0 - 17.0 g/dL   HCT 43.9 39.0 - 52.0 %   MCV 88.5 78.0 - 100.0 fL   MCH 30.8 26.0 - 34.0 pg   MCHC 34.9 30.0 - 36.0 g/dL   RDW 14.4 11.5 - 15.5 %   Platelets 267 150 - 400 K/uL  Urinalysis, Routine w reflex microscopic     Status: Abnormal   Collection Time: 11/16/15 11:30 PM  Result Value Ref Range   Color, Urine AMBER (A) YELLOW    Comment: BIOCHEMICALS MAY BE AFFECTED BY COLOR   APPearance CLOUDY (A) CLEAR   Specific Gravity, Urine 1.032 (H) 1.005 - 1.030   pH 5.5 5.0 - 8.0   Glucose, UA NEGATIVE NEGATIVE mg/dL   Hgb urine dipstick MODERATE (A) NEGATIVE   Bilirubin Urine MODERATE (A) NEGATIVE   Ketones, ur 40 (A) NEGATIVE mg/dL   Protein, ur 100 (A) NEGATIVE mg/dL   Nitrite NEGATIVE NEGATIVE   Leukocytes, UA NEGATIVE NEGATIVE  Urine microscopic-add on     Status: Abnormal   Collection Time: 11/16/15 11:30 PM  Result Value Ref Range   Squamous Epithelial / LPF 0-5 (A) NONE SEEN   WBC, UA NONE SEEN 0 - 5 WBC/hpf   RBC / HPF NONE SEEN 0 - 5 RBC/hpf   Bacteria, UA NONE SEEN NONE SEEN  Urine-Other MUCOUS PRESENT   Urine rapid drug screen (hosp performed)     Status: Abnormal   Collection Time: 11/16/15 11:30 PM  Result Value Ref Range   Opiates NONE DETECTED NONE DETECTED   Cocaine NONE DETECTED NONE DETECTED   Benzodiazepines NONE DETECTED NONE DETECTED   Amphetamines NONE DETECTED NONE DETECTED   Tetrahydrocannabinol POSITIVE (A) NONE DETECTED   Barbiturates NONE DETECTED NONE DETECTED    Comment:        DRUG SCREEN FOR MEDICAL PURPOSES ONLY.  IF CONFIRMATION IS NEEDED FOR ANY PURPOSE, NOTIFY LAB WITHIN 5 DAYS.        LOWEST DETECTABLE LIMITS FOR URINE DRUG SCREEN Drug Class       Cutoff (ng/mL) Amphetamine      1000 Barbiturate      200 Benzodiazepine   941 Tricyclics       740 Opiates          300 Cocaine          300 THC              50   CBG monitoring, ED     Status: None   Collection Time: 11/16/15 11:46 PM   Result Value Ref Range   Glucose-Capillary 96 65 - 99 mg/dL  Sedimentation rate     Status: Abnormal   Collection Time: 11/17/15  4:26 AM  Result Value Ref Range   Sed Rate 26 (H) 0 - 16 mm/hr  CBC with Differential/Platelet     Status: None   Collection Time: 11/17/15  4:26 AM  Result Value Ref Range   WBC 8.3 4.0 - 10.5 K/uL   RBC 4.61 4.22 - 5.81 MIL/uL   Hemoglobin 14.0 13.0 - 17.0 g/dL   HCT 40.6 39.0 - 52.0 %   MCV 88.1 78.0 - 100.0 fL   MCH 30.4 26.0 - 34.0 pg   MCHC 34.5 30.0 - 36.0 g/dL   RDW 14.4 11.5 - 15.5 %   Platelets 278 150 - 400 K/uL   Neutrophils Relative % 69 %   Neutro Abs 5.7 1.7 - 7.7 K/uL   Lymphocytes Relative 24 %   Lymphs Abs 2.0 0.7 - 4.0 K/uL   Monocytes Relative 7 %   Monocytes Absolute 0.6 0.1 - 1.0 K/uL   Eosinophils Relative 0 %   Eosinophils Absolute 0.0 0.0 - 0.7 K/uL   Basophils Relative 0 %   Basophils Absolute 0.0 0.0 - 0.1 K/uL  Comprehensive metabolic panel     Status: Abnormal   Collection Time: 11/17/15  4:26 AM  Result Value Ref Range   Sodium 139 135 - 145 mmol/L   Potassium 3.1 (L) 3.5 - 5.1 mmol/L   Chloride 104 101 - 111 mmol/L   CO2 24 22 - 32 mmol/L   Glucose, Bld 114 (H) 65 - 99 mg/dL   BUN 18 6 - 20 mg/dL   Creatinine, Ser 0.89 0.61 - 1.24 mg/dL   Calcium 9.3 8.9 - 10.3 mg/dL   Total Protein 8.3 (H) 6.5 - 8.1 g/dL   Albumin 4.2 3.5 - 5.0 g/dL   AST 36 15 - 41 U/L   ALT 18 17 - 63 U/L   Alkaline Phosphatase 69 38 - 126 U/L   Total Bilirubin 1.3 (H) 0.3 - 1.2 mg/dL   GFR calc non Af Amer >60 >60 mL/min   GFR calc Af Amer >60 >60 mL/min    Comment: (NOTE) The eGFR has been calculated using the CKD EPI  equation. This calculation has not been validated in all clinical situations. eGFR's persistently <60 mL/min signify possible Chronic Kidney Disease.    Anion gap 11 5 - 15   Dg Chest 2 View  11/17/2015  CLINICAL DATA:  65 year old male with hypertension EXAM: CHEST  2 VIEW COMPARISON:  Chest radiograph dated  05/07/2011 FINDINGS: The heart size and mediastinal contours are within normal limits. Both lungs are clear. The visualized skeletal structures are unremarkable. IMPRESSION: No active cardiopulmonary disease. Electronically Signed   By: Anner Crete M.D.   On: 11/17/2015 00:45   Ct Head Wo Contrast  11/17/2015  CLINICAL DATA:  Acute onset of lightheadedness. Unsteady gait. High blood pressure. Initial encounter. EXAM: CT HEAD WITHOUT CONTRAST TECHNIQUE: Contiguous axial images were obtained from the base of the skull through the vertex without intravenous contrast. COMPARISON:  CT of the neck performed 05/01/2011 FINDINGS: There is an evolving large acute or subacute infarct involving the left occipital lobe, extending into the posterior aspect of the left temporal lobe. There is mild mass effect, without significant midline shift. No definite hemorrhagic transformation is noted, though there is slight irregularity about the left transverse sinus as it passes posterior to the infarct. Mild cerebellar atrophy is noted. Scattered periventricular and subcortical white matter change likely reflects small vessel ischemic microangiopathy. A chronic infarct is noted at the high right parietal lobe, with associated encephalomalacia. Prominence of the ventricles and sulci reflects mild cortical volume loss. The brainstem and fourth ventricle are within normal limits. There is no evidence of fracture; visualized osseous structures are unremarkable in appearance. The orbits are within normal limits. The paranasal sinuses and mastoid air cells are well-aerated. No significant soft tissue abnormalities are seen. IMPRESSION: 1. Evolving large acute or subacute infarct involving the left occipital lobe, extending into the posterior aspect of the left temporal lobe. Mild mass effect, without significant midline shift. No definite hemorrhagic transformation is seen, though there is slight irregularity of the left transverse  sinus as it passes posterior to the infarct. 2. Chronic infarct at the high right parietal lobe, with associated encephalomalacia. 3. Mild cortical volume loss and scattered small vessel ischemic microangiopathy. These results were called by telephone at the time of interpretation on 11/17/2015 at 2:05 am to Dr. Veatrice Kells, who verbally acknowledged these results. Electronically Signed   By: Garald Balding M.D.   On: 11/17/2015 02:07    Assessment: 65 y.o. male with a history of hypertension and noncompliance as well as previous right parietal stroke, presenting with acute left PCA territory ischemic infarction with right homonymous hemianopsia.  Stroke Risk Factors - hypertension  Plan: 1. HgbA1c, fasting lipid panel 2. MRI, MRA  of the brain without contrast 3. PT consult, OT consult, Speech consult 4. Echocardiogram 5. Carotid dopplers 6. Prophylactic therapy-Antiplatelet med: Aspirin  7. Risk factor modification 8. Telemetry monitoring  C.R. Nicole Kindred, MD [facility] [phone]  11/17/2015, 6:20 AM

## 2015-11-17 NOTE — ED Notes (Signed)
Pt is here for high BP although he hasn't check it, but he states that he just feels bad, including headache, and he attributes this to having high BP. Denies CP.

## 2015-11-17 NOTE — Evaluation (Signed)
Physical Therapy Evaluation Patient Details Name: Gregory Perry MRN: [id number] DOB: 04/30/1951 Today's Date: 11/17/2015   History of Present Illness  65 y.o. male admitted for near syncope, fall and HTN. MRI (+) acute/subacute large L PCA territory infarct and remote parietal infarct. PMH significant for HTN, dysphagia.  Clinical Impression  Pt admitted with/for s/s/ of stroke, per MRI large L PCA territory infarct..  Pt currently limited functionally due to the problems listed below.  (see problems list.)  Pt will benefit from PT to maximize function and safety to be able to get home safely withlimited available assist.  Will need to see again to decide if pt can safely stay alone while family works.      Follow Up Recommendations Home health PT;Outpatient PT;Other (comment) (HH to OPPT)    Equipment Recommendations  None recommended by PT    Recommendations for Other Services       Precautions / Restrictions Precautions Precautions: Fall Precaution Comments: R homonymous heminanopsia Restrictions Weight Bearing Restrictions: No      Mobility  Bed Mobility Overal bed mobility: Modified Independent             General bed mobility comments: HOB flat, no use of bedrails  Transfers Overall transfer level: Needs assistance Equipment used: None Transfers: Sit to/from Stand Sit to Stand: Supervision         General transfer comment: Supervision for safety and balance. Pt impulsive at times and requires cues to slow down and maintain safety at all times.  Ambulation/Gait Ambulation/Gait assistance: Supervision Ambulation Distance (Feet): 250 Feet Assistive device: None Gait Pattern/deviations: Step-through pattern Gait velocity: moderate Gait velocity interpretation: at or above normal speed for age/gender General Gait Details: mildly unsteady overall, but no overt LOB.  The main problem is his hemianopsia and pt not scanning enough to see obstacles on the  Right side thus running into people and objects.  Stairs            Wheelchair Mobility    Modified Rankin (Stroke Patients Only) Modified Rankin (Stroke Patients Only) Pre-Morbid Rankin Score: No symptoms Modified Rankin: Moderate disability     Balance Overall balance assessment: Needs assistance   Sitting balance-Leahy Scale: Normal     Standing balance support: No upper extremity supported Standing balance-Leahy Scale: Good                               Pertinent Vitals/Pain Pain Assessment: No/denies pain    Home Living Family/patient expects to be discharged to:: Private residence Living Arrangements: Other relatives (sister) Available Help at Discharge: Family;Available PRN/intermittently (sister works during the day) Type of Home: House Home Access: Stairs to enter Entrance Stairs-Rails: None Technical brewer of Steps: 4 Home Layout: One level Home Equipment: [employer] - single point      Prior Function Level of Independence: Independent         Comments: Retired Nurse, adult   Dominant Hand: Left    Extremity/Trunk Assessment   Upper Extremity Assessment: Defer to OT evaluation           Lower Extremity Assessment: Overall WFL for tasks assessed;RLE deficits/detail;LLE deficits/detail RLE Deficits / Details: mildly weak and uncoordinated LLE Deficits / Details: mildly weak and uncoordinated  Cervical / Trunk Assessment: Normal  Communication   Communication: No difficulties  Cognition Arousal/Alertness: Awake/alert Behavior During Therapy: WFL for tasks assessed/performed Overall Cognitive Status: Within Functional  Limits for tasks assessed                      General Comments General comments (skin integrity, edema, etc.): Session limited due to pt going to echo.  R hemianopsia a problem in that pt not aware of what he is not seeing.    Exercises        Assessment/Plan    PT  Assessment Patient needs continued PT services  PT Diagnosis Other (comment) (hemiparesis)   PT Problem List Decreased strength;Decreased activity tolerance;Decreased balance;Decreased coordination;Decreased mobility;Decreased safety awareness;Decreased knowledge of precautions  PT Treatment Interventions Gait training;Stair training;Functional mobility training;Therapeutic activities;Balance training;Patient/family education   PT Goals (Current goals can be found in the Care Plan section) Acute Rehab PT Goals Patient Stated Goal: to go home Time For Goal Achievement: 12/01/15 Potential to Achieve Goals: Good    Frequency Min 3X/week   Barriers to discharge Decreased caregiver support      Co-evaluation               End of Session   Activity Tolerance: Patient tolerated treatment well Patient left: Other (comment) (W/C for procedure) Nurse Communication: Mobility status         Time: [id number] PT Time Calculation (min) (ACUTE ONLY): 25 min   Charges:   PT Evaluation $PT Eval Moderate Complexity: 1 Procedure PT Treatments $Gait Training: 8-22 mins   PT G Codes:        Evanne Matsunaga, Tessie Fass 11/17/2015, 5:27 PM 11/17/2015  Donnella Sham, PT [phone]  (pager)

## 2015-11-18 DIAGNOSIS — E43 Unspecified severe protein-calorie malnutrition: Secondary | ICD-10-CM

## 2015-11-18 LAB — BASIC METABOLIC PANEL
Anion gap: 11 (ref 5–15)
BUN: 16 mg/dL (ref 6–20)
CALCIUM: 9.9 mg/dL (ref 8.9–10.3)
CO2: 23 mmol/L (ref 22–32)
CREATININE: 1.05 mg/dL (ref 0.61–1.24)
Chloride: 102 mmol/L (ref 101–111)
GFR calc Af Amer: >60 mL/min (ref >60)
GLUCOSE: 83 mg/dL (ref 65–99)
Potassium: 3.6 mmol/L (ref 3.5–5.1)
Sodium: 136 mmol/L (ref 135–145)

## 2015-11-18 LAB — HEMOGLOBIN A1C
Hgb A1c MFr Bld: 4.9 % (ref 4.8–5.6)
Mean Plasma Glucose: 94 mg/dL

## 2015-11-18 LAB — MAGNESIUM: Magnesium: 1.8 mg/dL (ref 1.7–2.4)

## 2015-11-18 MED ORDER — ACETAMINOPHEN 160 MG/5ML PO SOLN
325.0000 mg | ORAL | Status: DC | PRN
  Administered 2015-11-18: 650 mg via ORAL
  Filled 2015-11-18: qty 20.3

## 2015-11-18 MED ORDER — ASPIRIN 300 MG RE SUPP
300.0000 mg | Freq: Every day | RECTAL | Status: DC
  Administered 2015-11-18 – 2015-11-21 (×5): 300 mg via RECTAL
  Filled 2015-11-18 (×6): qty 1

## 2015-11-18 NOTE — Progress Notes (Signed)
Occupational Therapy Treatment Patient Details Name: Gregory Perry MRN: [id number] DOB: 11/09/1950 Today's Date: 11/18/2015    History of present illness 64 y.o. male admitted for near syncope, fall and HTN. MRI (+) acute/subacute large L PCA territory infarct and remote parietal infarct. PMH significant for HTN, dysphagia.   OT comments  Pt. Able to participate in continued visual assessment with incorporation of functional task ie: self feeding.  Spoke with OTR/L who feels out patient remains best option for continued visual therapy.    Follow Up Recommendations  Outpatient OT;Supervision - Intermittent    Equipment Recommendations  None recommended by OT    Recommendations for Other Services      Precautions / Restrictions Precautions Precautions: Fall Precaution Comments: R homonymous heminanopsia       Mobility Bed Mobility                  Transfers                      Balance                                   ADL                                                Vision                 Additional Comments: pt. able to visually tract, and reach for items with no issues noted.  denies any blurry or dizziness with functional activities.     Perception     Praxis      Cognition                             Extremity/Trunk Assessment               Exercises     Shoulder Instructions       General Comments      Pertinent Vitals/ Pain          Home Living                                          Prior Functioning/Environment              Frequency Min 3X/week     Progress Toward Goals  OT Goals(current goals can now be found in the care plan section)  Progress towards OT goals: Progressing toward goals     Plan Discharge plan remains appropriate    Co-evaluation                 End of Session     Activity Tolerance Patient tolerated  treatment well   Patient Left in bed;with call bell/phone within reach   Nurse Communication          Time: [id number] OT Time Calculation (min): 15 min  Charges: OT General Charges $OT Visit: 1 Procedure OT Treatments $Self Care/Home Management : 8-22 mins  Janice Coffin, COTA/L 11/18/2015, 9:17 AM

## 2015-11-18 NOTE — Progress Notes (Addendum)
PROGRESS NOTE  Gregory Perry [id number] DOB: 11/25/1950 DOA: 11/16/2015 PCP: PROVIDER NOT IN SYSTEM  Brief History:  65 year old male with a history of hypertension, tobacco abuse, alcohol abuse presents with one-day history of gait instability and fall. Upon further questioning, the patient did not have loss of consciousness. However, the patient stated that he "did not feel right"the day prior to admission. He denied any dysarthria, focal collection may weakness, loss of vision. Initial CT of the brain revealed evolving acute or subacute infarct in the left occipital lobe. MRI revealed a left PCA infarct. As result, neurology was consulted and full stroke workup was undertaken. In addition, the patient has been complaining of dysphagia and odynophagia for the last 2-3 years. He has had intermittent vomiting because he is not able to swallow food. In addition he has lost 30 pounds in the past 6-12 months. On 04/29/2011, the patient had an EGD performed by Dr. Oletta Lamas which revealed a stricture in the proximal esophagus with a bloody ulcer. There was difficulty advancing the scope at that time. Old records reviewed and summarized  Assessment/Plan: Left PCA stroke -Appreciate Neurology Consult -PT/OT evaluation--> HHPT -Speech therapy eval--Dys 3 diet when able to tolerate -CT brain--evolving acute or subacute left occipital lobe infarct -MRI-acute left PCA infarct -MRA brain---severe stenosis left posterior cerebral artery, artery stenosis right posterior cerebral artery -CTA neck--no significant carotid or vertebral stenosis -Echo---EF 35-40%; diffuse HK, grade 1 DD, negative bubble study -LDL--136 -HbA1C--4.9 -11/17/15--loop recorder -Continue aspirin suppository until able to tolerate po  Dysphagia/Esophageal stricture -appreciate GI help -having difficulty tolerating full liquids-->vomit -downgrade to clear liquids -Esophagram planned 6/26 -may need ENT  help -continue IVF  Hypertension -Allow for permissive hypertension, then gradually normalize over the next 5-7 days -hydralazine for SBP >220 or DBP >120  Alcohol and tobacco abuse -CIWA -NicoDerm patch -Cessation discussed -Last drink 4 days prior to admission -Patient drinks 2 beers with shot of vodka daily  Cardiomyopathy -start carvedilol when able to tolerate po -will need myoview in outpt setting -Echo---EF 35-40%; diffuse HK, grade 1 DD, negative bubble study  Tachycardia -Patient had 3-4 episodes of tachycardia up to 140 fasting one to 2 minutes -Patient was asymptomatic -Regular telemetry suggest possible atrial tachycardia -Plan to start low-dose beta blocker if continues -loop recorder placed  Severe protein calorie malnutrition -Continue ensure   Disposition Plan: Home in 2-3 days  Family Communication: Sister update at bedside 6/23   Consultants: Neurology, cardiology, [facility]  Code Status: FULL  DVT Prophylaxis: [place] Heparin   Procedures: As Listed in Progress Note Above  Antibiotics: None   Subjective: Patient denies fevers, chills, headache, chest pain, dyspnea, nausea,diarrhea, abdominal pain, dysuria, hematuria. He is still having dysphagia and is concerned about drinking any full liquid type diet. He is having intermittent regurgitation. Denies hematochezia or melena. No hematemesis   Objective: Filed Vitals:   11/18/15 0130 11/18/15 0628 11/18/15 0924 11/18/15 1455  BP: 131/59 145/88 145/86 168/94  Pulse: 78 78 76 83  Temp: 99.3 F (37.4 C) 98.8 F (37.1 C) 99.1 F (37.3 C) 98 F (36.7 C)  TempSrc: Oral Oral Oral Oral  Resp: 16 20 18 18   Height:      Weight:      SpO2: 97% 99% 100% 100%    Intake/Output Summary (Last 24 hours) at 11/18/15 1543 Last data filed at 11/18/15 1300  Gross per 24 hour  Intake   1245 ml  Output      0 ml  Net   1245 ml   Weight change:  Exam:   General:  Pt is alert, follows commands  appropriately, not in acute distress  HEENT: No icterus, No thrush, No neck mass, [place]/AT  Cardiovascular: RRR, S1/S2, no rubs, no gallops  Respiratory: CTA bilaterally, no wheezing, no crackles, no rhonchi  Abdomen: Soft/+BS, non tender, non distended, no guarding  Extremities: No edema, No lymphangitis, No petechiae, No rashes, no synovitis   Data Reviewed: I have personally reviewed following labs and imaging studies Basic Metabolic Panel:  Recent Labs Lab 11/16/15 2101 11/17/15 0426 11/17/15 1656 11/18/15 0545  NA 140 139  --  136  K 3.7 3.1*  --  3.6  CL 107 104  --  102  CO2 22 24  --  23  GLUCOSE 106* 114*  --  83  BUN 17 18  --  16  CREATININE 0.95 0.89 1.12 1.05  CALCIUM 9.9 9.3  --  9.9  MG  --   --   --  1.8   Liver Function Tests:  Recent Labs Lab 11/17/15 0426  AST 36  ALT 18  ALKPHOS 69  BILITOT 1.3*  PROT 8.3*  ALBUMIN 4.2   No results for input(s): LIPASE, AMYLASE in the last 168 hours. No results for input(s): AMMONIA in the last 168 hours. Coagulation Profile: No results for input(s): INR, PROTIME in the last 168 hours. CBC:  Recent Labs Lab 11/16/15 2101 11/17/15 0426 11/17/15 1656  WBC 9.5 8.3 8.9  NEUTROABS  --  5.7  --   HGB 15.3 14.0 13.5  HCT 43.9 40.6 40.0  MCV 88.5 88.1 88.3  PLT 267 278 260   Cardiac Enzymes: No results for input(s): CKTOTAL, CKMB, CKMBINDEX, TROPONINI in the last 168 hours. BNP: Invalid input(s): POCBNP CBG:  Recent Labs Lab 11/16/15 2346  GLUCAP 96   HbA1C:  Recent Labs  11/17/15 0426  HGBA1C 4.9   Urine analysis:    Component Value Date/Time   COLORURINE AMBER* 11/16/2015 2330   APPEARANCEUR CLOUDY* 11/16/2015 2330   LABSPEC 1.032* 11/16/2015 2330   PHURINE 5.5 11/16/2015 2330   GLUCOSEU NEGATIVE 11/16/2015 2330   HGBUR MODERATE* 11/16/2015 2330   BILIRUBINUR MODERATE* 11/16/2015 2330   KETONESUR 40* 11/16/2015 2330   PROTEINUR 100* 11/16/2015 2330   NITRITE NEGATIVE 11/16/2015  2330   LEUKOCYTESUR NEGATIVE 11/16/2015 2330   Sepsis Labs: @LABRCNTIP (procalcitonin:4,lacticidven:4) )No results found for this or any previous visit (from the past 240 hour(s)).   Scheduled Meds: . aspirin  300 mg Rectal Daily  . atorvastatin  40 mg Oral q1800  . feeding supplement (ENSURE ENLIVE)  237 mL Oral BID BM  . heparin subcutaneous  5,000 Units Subcutaneous Q8H   Continuous Infusions: . sodium chloride 75 mL/hr at 11/18/15 [id number]    Procedures/Studies: Dg Chest 2 View  11/17/2015  CLINICAL DATA:  65 year old male with hypertension EXAM: CHEST  2 VIEW COMPARISON:  Chest radiograph dated 05/07/2011 FINDINGS: The heart size and mediastinal contours are within normal limits. Both lungs are clear. The visualized skeletal structures are unremarkable. IMPRESSION: No active cardiopulmonary disease. Electronically Signed   By: Anner Crete M.D.   On: 11/17/2015 00:45   Ct Head Wo Contrast  11/17/2015  CLINICAL DATA:  Acute onset of lightheadedness. Unsteady gait. High blood pressure. Initial encounter. EXAM: CT HEAD WITHOUT CONTRAST TECHNIQUE: Contiguous axial images were obtained from the base of the skull through the vertex without  intravenous contrast. COMPARISON:  CT of the neck performed 05/01/2011 FINDINGS: There is an evolving large acute or subacute infarct involving the left occipital lobe, extending into the posterior aspect of the left temporal lobe. There is mild mass effect, without significant midline shift. No definite hemorrhagic transformation is noted, though there is slight irregularity about the left transverse sinus as it passes posterior to the infarct. Mild cerebellar atrophy is noted. Scattered periventricular and subcortical white matter change likely reflects small vessel ischemic microangiopathy. A chronic infarct is noted at the high right parietal lobe, with associated encephalomalacia. Prominence of the ventricles and sulci reflects mild cortical volume loss.  The brainstem and fourth ventricle are within normal limits. There is no evidence of fracture; visualized osseous structures are unremarkable in appearance. The orbits are within normal limits. The paranasal sinuses and mastoid air cells are well-aerated. No significant soft tissue abnormalities are seen. IMPRESSION: 1. Evolving large acute or subacute infarct involving the left occipital lobe, extending into the posterior aspect of the left temporal lobe. Mild mass effect, without significant midline shift. No definite hemorrhagic transformation is seen, though there is slight irregularity of the left transverse sinus as it passes posterior to the infarct. 2. Chronic infarct at the high right parietal lobe, with associated encephalomalacia. 3. Mild cortical volume loss and scattered small vessel ischemic microangiopathy. These results were called by telephone at the time of interpretation on 11/17/2015 at 2:05 am to Dr. Veatrice Kells, who verbally acknowledged these results. Electronically Signed   By: Garald Balding M.D.   On: 11/17/2015 02:07   Ct Angio Neck W Or Wo Contrast  11/17/2015  CLINICAL DATA:  Stroke EXAM: CT ANGIOGRAPHY NECK TECHNIQUE: Multidetector CT imaging of the neck was performed using the standard protocol during bolus administration of intravenous contrast. Multiplanar CT image reconstructions and MIPs were obtained to evaluate the vascular anatomy. Carotid stenosis measurements (when applicable) are obtained utilizing NASCET criteria, using the distal internal carotid diameter as the denominator. CONTRAST:  50 mL Isovue 370 IV COMPARISON:  .MRI head 11/17/2015 FINDINGS: Aortic arch: Normal aortic arch.  Proximal great vessels are normal. Right carotid system: Right common carotid artery widely patent. Minimal atherosclerotic calcification of the carotid bifurcation without significant stenosis or dissection Left carotid system: Left common carotid artery widely patent. Mild atherosclerotic  calcification of the carotid bifurcation without stenosis. No dissection. Vertebral arteries:Left vertebral dominant. Both vertebral arteries patent without stenosis or dissection. Both vertebral arteries contribute to the basilar. Skeleton: Moderate cervical spine degenerative changes with spondylosis diffusely. No fracture or bone lesion. Other neck: .  Apical blebs.  No apical mass or infiltrate. IMPRESSION: No significant carotid or vertebral artery stenosis in the neck. Minimal atherosclerotic disease at the carotid bifurcation bilaterally. Electronically Signed   By: Franchot Gallo M.D.   On: 11/17/2015 10:57   Mr Brain Wo Contrast  11/17/2015  CLINICAL DATA:  Stroke EXAM: MRI HEAD WITHOUT CONTRAST MRA HEAD WITHOUT CONTRAST TECHNIQUE: Multiplanar, multiecho pulse sequences of the brain and surrounding structures were obtained without intravenous contrast. Angiographic images of the head were obtained using MRA technique without contrast. COMPARISON:  CT head 11/17/2015 FINDINGS: MRI HEAD FINDINGS Acute left posterior cerebral artery infarct. Restricted diffusion in the left posterior temporal and occipital lobe. Small area of acute infarct in the left thalamus. No associated hemorrhage. No other areas of acute infarct Chronic infarct right parietal cortex. Chronic microvascular ischemic changes throughout the white matter. Chronic infarct left caudate and right thalamus. Slight chronic  ischemia left pons. Negative for intracranial hemorrhage. Negative for mass or edema. No shift of the midline structures. Paranasal sinuses clear.  Normal orbit.  Normal pituitary. MRA HEAD FINDINGS Left vertebral artery dominant. Both vertebral arteries contribute to the basilar. Prominent AICA with moderate stenosis of the origin of the left AICA. Basilar widely patent. Superior cerebellar arteries patent bilaterally. Moderate stenosis of the right posterior cerebral artery. Severe stenosis left posterior cerebral artery  with better flow distally. This is the area of acute infarct. Cavernous carotid shows mild irregularity. Moderate focal stenosis left cavernous carotid. Right cavernous carotid patent. Anterior and middle cerebral arteries patent bilaterally. There is artifact in the left M1 segment. No large vessel occlusion or flow limiting stenosis. Negative for cerebral aneurysm. IMPRESSION: Acute infarct left PCA territory.  No hemorrhage Extensive chronic ischemic changes. Severe stenosis left posterior cerebral artery. Moderate stenosis right posterior cerebral artery. Electronically Signed   By: Franchot Gallo M.D.   On: 11/17/2015 07:28   Mr Jodene Nam Head/brain Wo Cm  11/17/2015  CLINICAL DATA:  Stroke EXAM: MRI HEAD WITHOUT CONTRAST MRA HEAD WITHOUT CONTRAST TECHNIQUE: Multiplanar, multiecho pulse sequences of the brain and surrounding structures were obtained without intravenous contrast. Angiographic images of the head were obtained using MRA technique without contrast. COMPARISON:  CT head 11/17/2015 FINDINGS: MRI HEAD FINDINGS Acute left posterior cerebral artery infarct. Restricted diffusion in the left posterior temporal and occipital lobe. Small area of acute infarct in the left thalamus. No associated hemorrhage. No other areas of acute infarct Chronic infarct right parietal cortex. Chronic microvascular ischemic changes throughout the white matter. Chronic infarct left caudate and right thalamus. Slight chronic ischemia left pons. Negative for intracranial hemorrhage. Negative for mass or edema. No shift of the midline structures. Paranasal sinuses clear.  Normal orbit.  Normal pituitary. MRA HEAD FINDINGS Left vertebral artery dominant. Both vertebral arteries contribute to the basilar. Prominent AICA with moderate stenosis of the origin of the left AICA. Basilar widely patent. Superior cerebellar arteries patent bilaterally. Moderate stenosis of the right posterior cerebral artery. Severe stenosis left posterior  cerebral artery with better flow distally. This is the area of acute infarct. Cavernous carotid shows mild irregularity. Moderate focal stenosis left cavernous carotid. Right cavernous carotid patent. Anterior and middle cerebral arteries patent bilaterally. There is artifact in the left M1 segment. No large vessel occlusion or flow limiting stenosis. Negative for cerebral aneurysm. IMPRESSION: Acute infarct left PCA territory.  No hemorrhage Extensive chronic ischemic changes. Severe stenosis left posterior cerebral artery. Moderate stenosis right posterior cerebral artery. Electronically Signed   By: Franchot Gallo M.D.   On: 11/17/2015 07:28    Demarkus Remmel, DO  [facility] Pager [phone]  If 7PM-7AM, please contact night-coverage [email or web address] Password [facility] 11/18/2015, 3:43 PM   LOS: 1 day

## 2015-11-19 LAB — BASIC METABOLIC PANEL
ANION GAP: 10 (ref 5–15)
BUN: 18 mg/dL (ref 6–20)
CALCIUM: 9.2 mg/dL (ref 8.9–10.3)
CO2: 23 mmol/L (ref 22–32)
Chloride: 107 mmol/L (ref 101–111)
Creatinine, Ser: 0.88 mg/dL (ref 0.61–1.24)
GFR calc Af Amer: >60 mL/min (ref >60)
GFR calc non Af Amer: >60 mL/min (ref >60)
GLUCOSE: 77 mg/dL (ref 65–99)
Potassium: 3.4 mmol/L — ABNORMAL LOW (ref 3.5–5.1)
Sodium: 140 mmol/L (ref 135–145)

## 2015-11-19 NOTE — Progress Notes (Signed)
Pt noted vomiting clear thick mucus. HOB elevated. Pt stated that he felt like something was in his throat. O2 sat 100%. Pt suctioned, coughed a couple of times then took a sip of his water. He then stated, " It went down." 02 sat 99%. No noted distress. Will continue to monitor. Md paged to notify.

## 2015-11-19 NOTE — Progress Notes (Signed)
Md notified of elevated bp, no new orders at this time. Pt with no noted distress. Denies pain or discomfort. Will continue to monitor.

## 2015-11-19 NOTE — Progress Notes (Signed)
PROGRESS NOTE  DEMAURI Gregory Perry [id number] DOB: Jun 04, 1950 DOA: 11/16/2015 PCP: PROVIDER NOT IN SYSTEM  Brief History:  65 year old male with a history of hypertension, tobacco abuse, alcohol abuse presents with one-day history of gait instability and fall. Upon further questioning, the patient did not have loss of consciousness. However, the patient stated that he "did not feel right"the day prior to admission. He denied any dysarthria, focal collection may weakness, loss of vision. Initial CT of the brain revealed evolving acute or subacute infarct in the left occipital lobe. MRI revealed a left PCA infarct. As result, neurology was consulted and full stroke workup was undertaken. In addition, the patient has been complaining of dysphagia and odynophagia for the last 2-3 years. He has had intermittent vomiting because he is not able to swallow food. In addition he has lost 30 pounds in the past 6-12 months. On 04/29/2011, the patient had an EGD performed by Dr. Oletta Lamas which revealed a stricture in the proximal esophagus with a bloody ulcer. There was difficulty advancing the scope at that time. Old records reviewed and summarized  Assessment/Plan: Left PCA stroke -Appreciate Neurology Consult -PT/OT evaluation--> HHPT -Speech therapy eval--Dys 3 diet when able to tolerate -CT brain--evolving acute or subacute left occipital lobe infarct -MRI-acute left PCA infarct -MRA brain---severe stenosis left posterior cerebral artery, artery stenosis right posterior cerebral artery -CTA neck--no significant carotid or vertebral stenosis -Echo---EF 35-40%; diffuse HK, grade 1 DD, negative bubble study -LDL--136 -HbA1C--4.9 -11/17/15--loop recorder -Continue aspirin suppository until able to tolerate po -right homonymous hemianopia--cannot drive until he has formal driving eval at [facility]  Dysphagia/Esophageal stricture -appreciate GI help -having difficulty tolerating full  liquids-->vomit -downgrade to clear liquids -Esophagram planned 6/26 -may need ENT help -continue IVF  Hypertension -Allow for permissive hypertension, then gradually normalize over the next 5-7 days -hydralazine for SBP >220 or DBP >120  Alcohol and tobacco abuse -CIWA--no signs or symptoms of withdrawal -NicoDerm patch -Cessation discussed -Last drink 4 days prior to admission -Patient drinks 2 beers with shot of vodka daily  Cardiomyopathy -start carvedilol when able to tolerate po -will need myoview in outpt setting -Echo---EF 35-40%; diffuse HK, grade 1 DD, negative bubble study  Tachycardia -11/17/15-Patient had 3-4 episodes of tachycardia up to 140 fasting one to 2 minutes -Patient was asymptomatic -telemetry review suggest possible atrial tachycardia -Plan to start low-dose beta blocker if continues -loop recorder placed  Severe protein calorie malnutrition -Continue ensure   Disposition Plan: Home in 2-3 days  Family Communication: Sister update at bedside 6/25--total time 35 min; >50% face to face time spent counseling and coordinating care   Consultants: Neurology, cardiology, [place] [facility]  Code Status: FULL  DVT Prophylaxis: [place] Heparin   Procedures: As Listed in Progress Note Above  Antibiotics: None   Subjective: Patient denies fevers, chills, headache, chest pain, dyspnea, nausea, vomiting, diarrhea, abdominal pain, dysuria, hematuria. Patient still having difficulty swallowing not much improvement since admission. He is having difficulty even with full liquid type consistency. Denies any odynophagia. No hemoptysis or hematemesis.   Objective: Filed Vitals:   11/19/15 0534 11/19/15 0800 11/19/15 1200 11/19/15 1442  BP: 154/89 151/92 145/76 172/97  Pulse: 67 71 76 62  Temp: 99.6 F (37.6 C) 98.1 F (36.7 C) 98.4 F (36.9 C) 98.1 F (36.7 C)  TempSrc: Oral Oral Oral Oral  Resp: 18 18 18 18   Height:      Weight:  SpO2: 98% 99%  100% 99%    Intake/Output Summary (Last 24 hours) at 11/19/15 1522 Last data filed at 11/19/15 1415  Gross per 24 hour  Intake   1005 ml  Output    900 ml  Net    105 ml   Weight change:  Exam:   General:  Pt is alert, follows commands appropriately, not in acute distress  HEENT: No icterus, No thrush, No neck mass, [place]/AT  Cardiovascular: RRR, S1/S2, no rubs, no gallops  Respiratory: CTA bilaterally, no wheezing, no crackles, no rhonchi  Abdomen: Soft/+BS, non tender, non distended, no guarding  Extremities: No edema, No lymphangitis, No petechiae, No rashes, no synovitis   Data Reviewed: I have personally reviewed following labs and imaging studies Basic Metabolic Panel:  Recent Labs Lab 11/16/15 2101 11/17/15 0426 11/17/15 1656 11/18/15 0545 11/19/15 0612  NA 140 139  --  136 140  K 3.7 3.1*  --  3.6 3.4*  CL 107 104  --  102 107  CO2 22 24  --  23 23  GLUCOSE 106* 114*  --  83 77  BUN 17 18  --  16 18  CREATININE 0.95 0.89 1.12 1.05 0.88  CALCIUM 9.9 9.3  --  9.9 9.2  MG  --   --   --  1.8  --    Liver Function Tests:  Recent Labs Lab 11/17/15 0426  AST 36  ALT 18  ALKPHOS 69  BILITOT 1.3*  PROT 8.3*  ALBUMIN 4.2   No results for input(s): LIPASE, AMYLASE in the last 168 hours. No results for input(s): AMMONIA in the last 168 hours. Coagulation Profile: No results for input(s): INR, PROTIME in the last 168 hours. CBC:  Recent Labs Lab 11/16/15 2101 11/17/15 0426 11/17/15 1656  WBC 9.5 8.3 8.9  NEUTROABS  --  5.7  --   HGB 15.3 14.0 13.5  HCT 43.9 40.6 40.0  MCV 88.5 88.1 88.3  PLT 267 278 260   Cardiac Enzymes: No results for input(s): CKTOTAL, CKMB, CKMBINDEX, TROPONINI in the last 168 hours. BNP: Invalid input(s): POCBNP CBG:  Recent Labs Lab 11/16/15 2346  GLUCAP 96   HbA1C:  Recent Labs  11/17/15 0426  HGBA1C 4.9   Urine analysis:    Component Value Date/Time   COLORURINE AMBER* 11/16/2015 2330   APPEARANCEUR  CLOUDY* 11/16/2015 2330   LABSPEC 1.032* 11/16/2015 2330   PHURINE 5.5 11/16/2015 2330   GLUCOSEU NEGATIVE 11/16/2015 2330   HGBUR MODERATE* 11/16/2015 2330   BILIRUBINUR MODERATE* 11/16/2015 2330   KETONESUR 40* 11/16/2015 2330   PROTEINUR 100* 11/16/2015 2330   NITRITE NEGATIVE 11/16/2015 2330   LEUKOCYTESUR NEGATIVE 11/16/2015 2330   Sepsis Labs: @LABRCNTIP (procalcitonin:4,lacticidven:4) )No results found for this or any previous visit (from the past 240 hour(s)).   Scheduled Meds: . aspirin  300 mg Rectal Daily  . atorvastatin  40 mg Oral q1800  . feeding supplement (ENSURE ENLIVE)  237 mL Oral BID BM  . heparin subcutaneous  5,000 Units Subcutaneous Q8H   Continuous Infusions: . sodium chloride 75 mL/hr at 11/18/15 2200    Procedures/Studies: Dg Chest 2 View  11/17/2015  CLINICAL DATA:  65 year old male with hypertension EXAM: CHEST  2 VIEW COMPARISON:  Chest radiograph dated 05/07/2011 FINDINGS: The heart size and mediastinal contours are within normal limits. Both lungs are clear. The visualized skeletal structures are unremarkable. IMPRESSION: No active cardiopulmonary disease. Electronically Signed   By: Anner Crete M.D.   On:  11/17/2015 00:45   Ct Head Wo Contrast  11/17/2015  CLINICAL DATA:  Acute onset of lightheadedness. Unsteady gait. High blood pressure. Initial encounter. EXAM: CT HEAD WITHOUT CONTRAST TECHNIQUE: Contiguous axial images were obtained from the base of the skull through the vertex without intravenous contrast. COMPARISON:  CT of the neck performed 05/01/2011 FINDINGS: There is an evolving large acute or subacute infarct involving the left occipital lobe, extending into the posterior aspect of the left temporal lobe. There is mild mass effect, without significant midline shift. No definite hemorrhagic transformation is noted, though there is slight irregularity about the left transverse sinus as it passes posterior to the infarct. Mild cerebellar  atrophy is noted. Scattered periventricular and subcortical white matter change likely reflects small vessel ischemic microangiopathy. A chronic infarct is noted at the high right parietal lobe, with associated encephalomalacia. Prominence of the ventricles and sulci reflects mild cortical volume loss. The brainstem and fourth ventricle are within normal limits. There is no evidence of fracture; visualized osseous structures are unremarkable in appearance. The orbits are within normal limits. The paranasal sinuses and mastoid air cells are well-aerated. No significant soft tissue abnormalities are seen. IMPRESSION: 1. Evolving large acute or subacute infarct involving the left occipital lobe, extending into the posterior aspect of the left temporal lobe. Mild mass effect, without significant midline shift. No definite hemorrhagic transformation is seen, though there is slight irregularity of the left transverse sinus as it passes posterior to the infarct. 2. Chronic infarct at the high right parietal lobe, with associated encephalomalacia. 3. Mild cortical volume loss and scattered small vessel ischemic microangiopathy. These results were called by telephone at the time of interpretation on 11/17/2015 at 2:05 am to Dr. Veatrice Kells, who verbally acknowledged these results. Electronically Signed   By: Garald Balding M.D.   On: 11/17/2015 02:07   Ct Angio Neck W Or Wo Contrast  11/17/2015  CLINICAL DATA:  Stroke EXAM: CT ANGIOGRAPHY NECK TECHNIQUE: Multidetector CT imaging of the neck was performed using the standard protocol during bolus administration of intravenous contrast. Multiplanar CT image reconstructions and MIPs were obtained to evaluate the vascular anatomy. Carotid stenosis measurements (when applicable) are obtained utilizing NASCET criteria, using the distal internal carotid diameter as the denominator. CONTRAST:  50 mL Isovue 370 IV COMPARISON:  .MRI head 11/17/2015 FINDINGS: Aortic arch: Normal  aortic arch.  Proximal great vessels are normal. Right carotid system: Right common carotid artery widely patent. Minimal atherosclerotic calcification of the carotid bifurcation without significant stenosis or dissection Left carotid system: Left common carotid artery widely patent. Mild atherosclerotic calcification of the carotid bifurcation without stenosis. No dissection. Vertebral arteries:Left vertebral dominant. Both vertebral arteries patent without stenosis or dissection. Both vertebral arteries contribute to the basilar. Skeleton: Moderate cervical spine degenerative changes with spondylosis diffusely. No fracture or bone lesion. Other neck: .  Apical blebs.  No apical mass or infiltrate. IMPRESSION: No significant carotid or vertebral artery stenosis in the neck. Minimal atherosclerotic disease at the carotid bifurcation bilaterally. Electronically Signed   By: Franchot Gallo M.D.   On: 11/17/2015 10:57   Mr Brain Wo Contrast  11/17/2015  CLINICAL DATA:  Stroke EXAM: MRI HEAD WITHOUT CONTRAST MRA HEAD WITHOUT CONTRAST TECHNIQUE: Multiplanar, multiecho pulse sequences of the brain and surrounding structures were obtained without intravenous contrast. Angiographic images of the head were obtained using MRA technique without contrast. COMPARISON:  CT head 11/17/2015 FINDINGS: MRI HEAD FINDINGS Acute left posterior cerebral artery infarct. Restricted diffusion in the  left posterior temporal and occipital lobe. Small area of acute infarct in the left thalamus. No associated hemorrhage. No other areas of acute infarct Chronic infarct right parietal cortex. Chronic microvascular ischemic changes throughout the white matter. Chronic infarct left caudate and right thalamus. Slight chronic ischemia left pons. Negative for intracranial hemorrhage. Negative for mass or edema. No shift of the midline structures. Paranasal sinuses clear.  Normal orbit.  Normal pituitary. MRA HEAD FINDINGS Left vertebral artery  dominant. Both vertebral arteries contribute to the basilar. Prominent AICA with moderate stenosis of the origin of the left AICA. Basilar widely patent. Superior cerebellar arteries patent bilaterally. Moderate stenosis of the right posterior cerebral artery. Severe stenosis left posterior cerebral artery with better flow distally. This is the area of acute infarct. Cavernous carotid shows mild irregularity. Moderate focal stenosis left cavernous carotid. Right cavernous carotid patent. Anterior and middle cerebral arteries patent bilaterally. There is artifact in the left M1 segment. No large vessel occlusion or flow limiting stenosis. Negative for cerebral aneurysm. IMPRESSION: Acute infarct left PCA territory.  No hemorrhage Extensive chronic ischemic changes. Severe stenosis left posterior cerebral artery. Moderate stenosis right posterior cerebral artery. Electronically Signed   By: Franchot Gallo M.D.   On: 11/17/2015 07:28   Mr Jodene Nam Head/brain Wo Cm  11/17/2015  CLINICAL DATA:  Stroke EXAM: MRI HEAD WITHOUT CONTRAST MRA HEAD WITHOUT CONTRAST TECHNIQUE: Multiplanar, multiecho pulse sequences of the brain and surrounding structures were obtained without intravenous contrast. Angiographic images of the head were obtained using MRA technique without contrast. COMPARISON:  CT head 11/17/2015 FINDINGS: MRI HEAD FINDINGS Acute left posterior cerebral artery infarct. Restricted diffusion in the left posterior temporal and occipital lobe. Small area of acute infarct in the left thalamus. No associated hemorrhage. No other areas of acute infarct Chronic infarct right parietal cortex. Chronic microvascular ischemic changes throughout the white matter. Chronic infarct left caudate and right thalamus. Slight chronic ischemia left pons. Negative for intracranial hemorrhage. Negative for mass or edema. No shift of the midline structures. Paranasal sinuses clear.  Normal orbit.  Normal pituitary. MRA HEAD FINDINGS Left  vertebral artery dominant. Both vertebral arteries contribute to the basilar. Prominent AICA with moderate stenosis of the origin of the left AICA. Basilar widely patent. Superior cerebellar arteries patent bilaterally. Moderate stenosis of the right posterior cerebral artery. Severe stenosis left posterior cerebral artery with better flow distally. This is the area of acute infarct. Cavernous carotid shows mild irregularity. Moderate focal stenosis left cavernous carotid. Right cavernous carotid patent. Anterior and middle cerebral arteries patent bilaterally. There is artifact in the left M1 segment. No large vessel occlusion or flow limiting stenosis. Negative for cerebral aneurysm. IMPRESSION: Acute infarct left PCA territory.  No hemorrhage Extensive chronic ischemic changes. Severe stenosis left posterior cerebral artery. Moderate stenosis right posterior cerebral artery. Electronically Signed   By: Franchot Gallo M.D.   On: 11/17/2015 07:28    Anjelique Makar, DO  [facility] Pager [phone]  If 7PM-7AM, please contact night-coverage [email or web address] Password [email or web address] 11/19/2015, 3:22 PM   LOS: 2 days

## 2015-11-19 NOTE — Progress Notes (Signed)
Administered medication per Md order in pudding. Pt states that his appetite is poor because when he eats the food doesn't seem to go down. He was educated to eat small bites. Tolerated Ensure with ice cream. He likes the taste better and it helps with swallowing. Suction available. No noted distress. Will continue to monitor.  Call bell within reach.

## 2015-11-20 ENCOUNTER — Encounter (HOSPITAL_COMMUNITY): Payer: Self-pay | Admitting: Internal Medicine

## 2015-11-20 ENCOUNTER — Inpatient Hospital Stay (HOSPITAL_COMMUNITY): Payer: Self-pay

## 2015-11-20 DIAGNOSIS — I63132 Cerebral infarction due to embolism of left carotid artery: Secondary | ICD-10-CM

## 2015-11-20 DIAGNOSIS — I5189 Other ill-defined heart diseases: Secondary | ICD-10-CM

## 2015-11-20 DIAGNOSIS — Z01810 Encounter for preprocedural cardiovascular examination: Secondary | ICD-10-CM

## 2015-11-20 MED ORDER — ASPIRIN EC 325 MG PO TBEC: 325.0000 mg | DELAYED_RELEASE_TABLET | Freq: Every day | ORAL | Status: DC

## 2015-11-20 MED ORDER — AMLODIPINE BESYLATE 5 MG PO TABS: 5.0000 mg | ORAL_TABLET | Freq: Every day | ORAL | Status: DC

## 2015-11-20 MED ORDER — AMLODIPINE BESYLATE 5 MG PO TABS
5.0000 mg | ORAL_TABLET | Freq: Every day | ORAL | Status: DC
  Administered 2015-11-20 – 2015-11-21 (×2): 5 mg via ORAL
  Filled 2015-11-20 (×2): qty 1

## 2015-11-20 MED ORDER — BOOST / RESOURCE BREEZE PO LIQD
1.0000 | Freq: Three times a day (TID) | ORAL | Status: DC
  Administered 2015-11-20 – 2015-11-23 (×8): 1 via ORAL
  Filled 2015-11-20 (×13): qty 1

## 2015-11-20 MED ORDER — ATORVASTATIN CALCIUM 40 MG PO TABS
40.0000 mg | ORAL_TABLET | Freq: Every day | ORAL | Status: DC
Start: 2015-11-20 — End: 2015-11-23

## 2015-11-20 MED ORDER — BOOST / RESOURCE BREEZE PO LIQD: 1.0000 | Freq: Three times a day (TID) | ORAL | Status: DC

## 2015-11-20 MED FILL — Lidocaine Inj 1% w/ Epinephrine-1:100000: INTRAMUSCULAR | Qty: 20 | Status: AC

## 2015-11-20 NOTE — Progress Notes (Signed)
PROGRESS NOTE  Gregory Perry [id number] DOB: 30-Nov-1950 DOA: 11/16/2015 PCP: PROVIDER NOT IN SYSTEM  Brief History:  65 year old male with a history of hypertension, tobacco abuse, alcohol abuse presents with one-day history of gait instability and fall. Upon further questioning, the patient did not have loss of consciousness. However, the patient stated that he "did not feel right"the day prior to admission. He denied any dysarthria, focal collection may weakness, loss of vision. Initial CT of the brain revealed evolving acute or subacute infarct in the left occipital lobe. MRI revealed a left PCA infarct. As result, neurology was consulted and full stroke workup was undertaken. In addition, the patient has been complaining of dysphagia and odynophagia for the last 2-3 years. He has had intermittent vomiting because he is not able to swallow food. In addition he has lost 30 pounds in the past 6-12 months. On 04/29/2011, the patient had an EGD performed by Dr. Oletta Lamas which revealed a stricture in the proximal esophagus with a bloody ulcer. There was difficulty advancing the scope at that time which required ENT to assist.  Initially, the patient did not want to undergo the risk of EGD and esophageal dilatation given his recent stroke. However, after speaking with his brother, the patient change his mind. Old records reviewed and summarized.  GI requested cardiology clearance as well as neurology to follow.  Assessment/Plan: Left PCA stroke -Appreciate Neurology Consult -PT/OT evaluation--> HHPT -Speech therapy eval--Dys 3 diet when able to tolerate -CT brain--evolving acute or subacute left occipital lobe infarct -MRI-acute left PCA infarct -MRA brain---severe stenosis left posterior cerebral artery, artery stenosis right posterior cerebral artery -CTA neck--no significant carotid or vertebral stenosis -Echo---EF 35-40%; diffuse HK, grade 1 DD, negative bubble  study -LDL--136 -HbA1C--4.9 -11/17/15--loop recorder -Continue aspirin suppository until able to tolerate po -right homonymous hemianopia--cannot drive until he has formal driving eval at [facility]  Dysphagia/Esophageal stricture -appreciate GI help -having difficulty tolerating full liquids-->vomit -downgrade to clear liquids -6/26 Esophagram--marked stricture in the proximal thoracic esophagus at the level of the transverse aorta -may need ENT help -continue IVF -The patient has changes mind and wants endoscopy and dilatation -GI has requested cardiac clearance and neurology to follow--cardiology has cleared the patient. -I have reconsulted stroke team--spoke with Dr. Leonie Man  Hypertension -Allow for permissive hypertension, then gradually normalize over the next 5-7 days -hydralazine for SBP >220 or DBP >120 - given the patient's hesitancy to swallow due to his dysphagia, start the patient on once daily amlodipine before switching him to bid or tid medications  Alcohol and tobacco abuse -CIWA--no signs or symptoms of withdrawal -NicoDerm patch -Cessation discussed -Last drink 4 days prior to admission -Patient drinks 2 beers with shot of vodka daily  Cardiomyopathy -start carvedilol when able to tolerate po -will need myoview in outpt setting -Echo---EF 35-40%; diffuse HK, grade 1 DD, negative bubble study -However, given the patient's hesitancy to swallow due to his dysphagia, start the patient on once daily amlodipine before switching him to bid or tid medications  Tachycardia -11/17/15-Patient had 3-4 episodes of tachycardia up to 140 fasting one to 2 minutes -Patient was asymptomatic -telemetry review suggest possible atrial tachycardia -Plan to start low-dose beta blocker if continues -loop recorder placed  Severe protein calorie malnutrition -Continue ensure   Disposition Plan: Home in 2-3 days  Family Communication: Sisters update at bedside 6/256--total time 345  min; >50% face to face time spent counseling and coordinating care  Consultants: Neurology, cardiology, [employer] [facility]  Code Status: FULL  DVT Prophylaxis: [place] Heparin   Procedures: As Listed in Progress Note Above  Antibiotics: None   Subjective: Patient states that he is having much difficulty swallowing pills. Denies any fevers, chills, chest pain, shortness breath, vomiting, diarrhea, abdominal pain. No hematochezia or melena. No dysuria or hematuria.  Objective: Filed Vitals:   11/20/15 0550 11/20/15 0931 11/20/15 0947 11/20/15 1450  BP: 148/79 172/85 168/88 173/101  Pulse: 76 67  75  Temp: 98.2 F (36.8 C) 98.2 F (36.8 C)  98.4 F (36.9 C)  TempSrc: Oral Oral  Oral  Resp: 16 16  17   Height:      Weight:      SpO2: 99% 100%  100%    Intake/Output Summary (Last 24 hours) at 11/20/15 1656 Last data filed at 11/19/15 2200  Gross per 24 hour  Intake      0 ml  Output    200 ml  Net   -200 ml   Weight change:  Exam:   General:  Pt is alert, follows commands appropriately, not in acute distress  HEENT: No icterus, No thrush, No neck mass, [place]/AT  Cardiovascular: RRR, S1/S2, no rubs, no gallops  Respiratory: CTA bilaterally, no wheezing, no crackles, no rhonchi  Abdomen: Soft/+BS, non tender, non distended, no guarding  Extremities: No edema, No lymphangitis, No petechiae, No rashes, no synovitis   Data Reviewed: I have personally reviewed following labs and imaging studies Basic Metabolic Panel:  Recent Labs Lab 11/16/15 2101 11/17/15 0426 11/17/15 1656 11/18/15 0545 11/19/15 0612  NA 140 139  --  136 140  K 3.7 3.1*  --  3.6 3.4*  CL 107 104  --  102 107  CO2 22 24  --  23 23  GLUCOSE 106* 114*  --  83 77  BUN 17 18  --  16 18  CREATININE 0.95 0.89 1.12 1.05 0.88  CALCIUM 9.9 9.3  --  9.9 9.2  MG  --   --   --  1.8  --    Liver Function Tests:  Recent Labs Lab 11/17/15 0426  AST 36  ALT 18  ALKPHOS 69  BILITOT 1.3*  PROT 8.3*   ALBUMIN 4.2   No results for input(s): LIPASE, AMYLASE in the last 168 hours. No results for input(s): AMMONIA in the last 168 hours. Coagulation Profile: No results for input(s): INR, PROTIME in the last 168 hours. CBC:  Recent Labs Lab 11/16/15 2101 11/17/15 0426 11/17/15 1656  WBC 9.5 8.3 8.9  NEUTROABS  --  5.7  --   HGB 15.3 14.0 13.5  HCT 43.9 40.6 40.0  MCV 88.5 88.1 88.3  PLT 267 278 260   Cardiac Enzymes: No results for input(s): CKTOTAL, CKMB, CKMBINDEX, TROPONINI in the last 168 hours. BNP: Invalid input(s): POCBNP CBG:  Recent Labs Lab 11/16/15 2346  GLUCAP 96   HbA1C: No results for input(s): HGBA1C in the last 72 hours. Urine analysis:    Component Value Date/Time   COLORURINE AMBER* 11/16/2015 2330   APPEARANCEUR CLOUDY* 11/16/2015 2330   LABSPEC 1.032* 11/16/2015 2330   PHURINE 5.5 11/16/2015 2330   GLUCOSEU NEGATIVE 11/16/2015 2330   HGBUR MODERATE* 11/16/2015 2330   BILIRUBINUR MODERATE* 11/16/2015 2330   KETONESUR 40* 11/16/2015 2330   PROTEINUR 100* 11/16/2015 2330   NITRITE NEGATIVE 11/16/2015 2330   LEUKOCYTESUR NEGATIVE 11/16/2015 2330   Sepsis Labs: @LABRCNTIP (procalcitonin:4,lacticidven:4) )No results found for this or any previous  visit (from the past 240 hour(s)).   Scheduled Meds: . amLODipine  5 mg Oral Daily  . aspirin  300 mg Rectal Daily  . atorvastatin  40 mg Oral q1800  . feeding supplement  1 Container Oral TID BM  . feeding supplement (ENSURE ENLIVE)  237 mL Oral BID BM  . heparin subcutaneous  5,000 Units Subcutaneous Q8H   Continuous Infusions:   Procedures/Studies: Dg Chest 2 View  11/17/2015  CLINICAL DATA:  65 year old male with hypertension EXAM: CHEST  2 VIEW COMPARISON:  Chest radiograph dated 05/07/2011 FINDINGS: The heart size and mediastinal contours are within normal limits. Both lungs are clear. The visualized skeletal structures are unremarkable. IMPRESSION: No active cardiopulmonary disease.  Electronically Signed   By: Anner Crete M.D.   On: 11/17/2015 00:45   Ct Head Wo Contrast  11/17/2015  CLINICAL DATA:  Acute onset of lightheadedness. Unsteady gait. High blood pressure. Initial encounter. EXAM: CT HEAD WITHOUT CONTRAST TECHNIQUE: Contiguous axial images were obtained from the base of the skull through the vertex without intravenous contrast. COMPARISON:  CT of the neck performed 05/01/2011 FINDINGS: There is an evolving large acute or subacute infarct involving the left occipital lobe, extending into the posterior aspect of the left temporal lobe. There is mild mass effect, without significant midline shift. No definite hemorrhagic transformation is noted, though there is slight irregularity about the left transverse sinus as it passes posterior to the infarct. Mild cerebellar atrophy is noted. Scattered periventricular and subcortical white matter change likely reflects small vessel ischemic microangiopathy. A chronic infarct is noted at the high right parietal lobe, with associated encephalomalacia. Prominence of the ventricles and sulci reflects mild cortical volume loss. The brainstem and fourth ventricle are within normal limits. There is no evidence of fracture; visualized osseous structures are unremarkable in appearance. The orbits are within normal limits. The paranasal sinuses and mastoid air cells are well-aerated. No significant soft tissue abnormalities are seen. IMPRESSION: 1. Evolving large acute or subacute infarct involving the left occipital lobe, extending into the posterior aspect of the left temporal lobe. Mild mass effect, without significant midline shift. No definite hemorrhagic transformation is seen, though there is slight irregularity of the left transverse sinus as it passes posterior to the infarct. 2. Chronic infarct at the high right parietal lobe, with associated encephalomalacia. 3. Mild cortical volume loss and scattered small vessel ischemic  microangiopathy. These results were called by telephone at the time of interpretation on 11/17/2015 at 2:05 am to Dr. Veatrice Kells, who verbally acknowledged these results. Electronically Signed   By: Garald Balding M.D.   On: 11/17/2015 02:07   Ct Angio Neck W Or Wo Contrast  11/17/2015  CLINICAL DATA:  Stroke EXAM: CT ANGIOGRAPHY NECK TECHNIQUE: Multidetector CT imaging of the neck was performed using the standard protocol during bolus administration of intravenous contrast. Multiplanar CT image reconstructions and MIPs were obtained to evaluate the vascular anatomy. Carotid stenosis measurements (when applicable) are obtained utilizing NASCET criteria, using the distal internal carotid diameter as the denominator. CONTRAST:  50 mL Isovue 370 IV COMPARISON:  .MRI head 11/17/2015 FINDINGS: Aortic arch: Normal aortic arch.  Proximal great vessels are normal. Right carotid system: Right common carotid artery widely patent. Minimal atherosclerotic calcification of the carotid bifurcation without significant stenosis or dissection Left carotid system: Left common carotid artery widely patent. Mild atherosclerotic calcification of the carotid bifurcation without stenosis. No dissection. Vertebral arteries:Left vertebral dominant. Both vertebral arteries patent without stenosis or dissection. Both  vertebral arteries contribute to the basilar. Skeleton: Moderate cervical spine degenerative changes with spondylosis diffusely. No fracture or bone lesion. Other neck: .  Apical blebs.  No apical mass or infiltrate. IMPRESSION: No significant carotid or vertebral artery stenosis in the neck. Minimal atherosclerotic disease at the carotid bifurcation bilaterally. Electronically Signed   By: Franchot Gallo M.D.   On: 11/17/2015 10:57   Mr Brain Wo Contrast  11/17/2015  CLINICAL DATA:  Stroke EXAM: MRI HEAD WITHOUT CONTRAST MRA HEAD WITHOUT CONTRAST TECHNIQUE: Multiplanar, multiecho pulse sequences of the brain and  surrounding structures were obtained without intravenous contrast. Angiographic images of the head were obtained using MRA technique without contrast. COMPARISON:  CT head 11/17/2015 FINDINGS: MRI HEAD FINDINGS Acute left posterior cerebral artery infarct. Restricted diffusion in the left posterior temporal and occipital lobe. Small area of acute infarct in the left thalamus. No associated hemorrhage. No other areas of acute infarct Chronic infarct right parietal cortex. Chronic microvascular ischemic changes throughout the white matter. Chronic infarct left caudate and right thalamus. Slight chronic ischemia left pons. Negative for intracranial hemorrhage. Negative for mass or edema. No shift of the midline structures. Paranasal sinuses clear.  Normal orbit.  Normal pituitary. MRA HEAD FINDINGS Left vertebral artery dominant. Both vertebral arteries contribute to the basilar. Prominent AICA with moderate stenosis of the origin of the left AICA. Basilar widely patent. Superior cerebellar arteries patent bilaterally. Moderate stenosis of the right posterior cerebral artery. Severe stenosis left posterior cerebral artery with better flow distally. This is the area of acute infarct. Cavernous carotid shows mild irregularity. Moderate focal stenosis left cavernous carotid. Right cavernous carotid patent. Anterior and middle cerebral arteries patent bilaterally. There is artifact in the left M1 segment. No large vessel occlusion or flow limiting stenosis. Negative for cerebral aneurysm. IMPRESSION: Acute infarct left PCA territory.  No hemorrhage Extensive chronic ischemic changes. Severe stenosis left posterior cerebral artery. Moderate stenosis right posterior cerebral artery. Electronically Signed   By: Franchot Gallo M.D.   On: 11/17/2015 07:28   Dg Esophagus  11/20/2015  CLINICAL DATA:  Dysphagia. Soft food and medicine lodging in upper esophagus. History of upper esophageal stricture. EXAM: ESOPHOGRAM/BARIUM  SWALLOW TECHNIQUE: Single contrast examination was performed using  thick barium. FLUOROSCOPY TIME:  Radiation Exposure Index (as provided by the fluoroscopic device): If the device does not provide the exposure index: Fluoroscopy Time:  0 minutes and 26 seconds. Number of Acquired Images: COMPARISON:  03/11/2011. FINDINGS: Patient was given an initial swallow of thick barium. This immediately demonstrates a marked gated narrowing of the proximal esophagus (approximately the level of the transverse aorta) beyond which only a trace amount of barium passes. This creates a "String sign" over a several cm segment of the proximal thoracic esophagus. The patient immediately had symptoms of fullness and sticking sensation in the esophagus and vomited the barium almost immediately. Given that only minimal barium was able to pass beyond the stricture, the exam was stopped. IMPRESSION: Marked stricture/ narrowing of the proximal thoracic esophagus, at approximately level of the transverse aorta, beyond which only minimal barium passes. This is near the location of the stricture seen on previous imaging. Upper endoscopy recommended to further elucidate etiology. Electronically Signed   By: Misty Stanley M.D.   On: 11/20/2015 09:41   Mr Jodene Nam Head/brain Wo Cm  11/17/2015  CLINICAL DATA:  Stroke EXAM: MRI HEAD WITHOUT CONTRAST MRA HEAD WITHOUT CONTRAST TECHNIQUE: Multiplanar, multiecho pulse sequences of the brain and surrounding structures were obtained  without intravenous contrast. Angiographic images of the head were obtained using MRA technique without contrast. COMPARISON:  CT head 11/17/2015 FINDINGS: MRI HEAD FINDINGS Acute left posterior cerebral artery infarct. Restricted diffusion in the left posterior temporal and occipital lobe. Small area of acute infarct in the left thalamus. No associated hemorrhage. No other areas of acute infarct Chronic infarct right parietal cortex. Chronic microvascular ischemic changes  throughout the white matter. Chronic infarct left caudate and right thalamus. Slight chronic ischemia left pons. Negative for intracranial hemorrhage. Negative for mass or edema. No shift of the midline structures. Paranasal sinuses clear.  Normal orbit.  Normal pituitary. MRA HEAD FINDINGS Left vertebral artery dominant. Both vertebral arteries contribute to the basilar. Prominent AICA with moderate stenosis of the origin of the left AICA. Basilar widely patent. Superior cerebellar arteries patent bilaterally. Moderate stenosis of the right posterior cerebral artery. Severe stenosis left posterior cerebral artery with better flow distally. This is the area of acute infarct. Cavernous carotid shows mild irregularity. Moderate focal stenosis left cavernous carotid. Right cavernous carotid patent. Anterior and middle cerebral arteries patent bilaterally. There is artifact in the left M1 segment. No large vessel occlusion or flow limiting stenosis. Negative for cerebral aneurysm. IMPRESSION: Acute infarct left PCA territory.  No hemorrhage Extensive chronic ischemic changes. Severe stenosis left posterior cerebral artery. Moderate stenosis right posterior cerebral artery. Electronically Signed   By: Franchot Gallo M.D.   On: 11/17/2015 07:28    Onnie Alatorre, DO  [facility] Pager [phone]  If 7PM-7AM, please contact night-coverage [email or web address] Password [email or web address] 11/20/2015, 4:56 PM   LOS: 3 days

## 2015-11-20 NOTE — Progress Notes (Signed)
Physical Therapy Treatment Patient Details Name: COLON DELAOSSA MRN: [id number] DOB: 15-Jul-1950 Today's Date: 11/20/2015    History of Present Illness 65 y.o. male admitted for near syncope, fall and HTN. MRI (+) acute/subacute large L PCA territory infarct and remote parietal infarct. PMH significant for HTN, dysphagia.    PT Comments    Progressing well, Emphasis on scanning to R and completing walking task while catch a ball in the R field which he improved on significantly.  Follow Up Recommendations  Outpatient PT     Equipment Recommendations  None recommended by PT    Recommendations for Other Services       Precautions / Restrictions Precautions Precautions: Fall Precaution Comments: R homonymous heminanopsia (improving) Restrictions Weight Bearing Restrictions: No    Mobility  Bed Mobility Overal bed mobility: Modified Independent                Transfers Overall transfer level: Modified independent   Transfers: Sit to/from Stand Sit to Stand: Modified independent (Device/Increase time)            Ambulation/Gait Ambulation/Gait assistance: Supervision Ambulation Distance (Feet): 600 Feet Assistive device: None Gait Pattern/deviations: Step-through pattern   Gait velocity interpretation: >2.62 ft/sec, indicative of independent community ambulator General Gait Details: challenged more with emphasis on scanning R ending with throwing ball for him to catch in R field which he improved on significantly.   Stairs Stairs: Yes Stairs assistance: Modified independent (Device/Increase time) Stair Management: One rail Right;Alternating pattern;Forwards Number of Stairs: 10    Wheelchair Mobility    Modified Rankin (Stroke Patients Only) Modified Rankin (Stroke Patients Only) Pre-Morbid Rankin Score: No symptoms Modified Rankin: Moderate disability     Balance Overall balance assessment: Needs assistance   Sitting balance-Leahy Scale:  Normal     Standing balance support: No upper extremity supported Standing balance-Leahy Scale: Good                      Cognition Arousal/Alertness: Awake/alert Behavior During Therapy: WFL for tasks assessed/performed Overall Cognitive Status: Within Functional Limits for tasks assessed                      Exercises      General Comments General comments (skin integrity, edema, etc.): R visual field tested grossly with improvements noted in lower quadrant more than upper quadrant.      Pertinent Vitals/Pain Pain Assessment: 0-10 Pain Score: 0-No pain    Home Living                      Prior Function            PT Goals (current goals can now be found in the care plan section) Acute Rehab PT Goals Patient Stated Goal: to go home Time For Goal Achievement: 12/01/15 Potential to Achieve Goals: Good Progress towards PT goals: Progressing toward goals    Frequency  Min 3X/week    PT Plan Discharge plan needs to be updated    Co-evaluation             End of Session   Activity Tolerance: Patient tolerated treatment well Patient left: in bed;with family/visitor present     Time: [id number] PT Time Calculation (min) (ACUTE ONLY): 26 min  Charges:  $Gait Training: 8-22 mins $Therapeutic Activity: 8-22 mins                    G  Codes:      Thedford Bunton, Tessie Fass 11/20/2015, 1:47 PM 11/20/2015  Donnella Sham, PT [phone]  (pager)

## 2015-11-20 NOTE — Progress Notes (Addendum)
Daily Rounding Note  11/20/2015, 9:59 AM  LOS: 3 days   SUBJECTIVE:       Tolerating full liquids and BID ensure.  No BMs  OBJECTIVE:         Vital signs in last 24 hours:    Temp:  [98.1 F (36.7 C)-98.8 F (37.1 C)] 98.2 F (36.8 C) (06/26 0931) Pulse Rate:  [62-86] 67 (06/26 0931) Resp:  [16-18] 16 (06/26 0931) BP: (145-181)/(76-115) 168/88 mmHg (06/26 0947) SpO2:  [98 %-100 %] 100 % (06/26 0931) Last BM Date: 11/16/15 Filed Weights   11/17/15 0530  Weight: 64.456 kg (142 lb 1.6 oz)   General: thin, malnourished looking.  NAD   Heart: RRR Chest: clear bil.   Abdomen: soft, NT, ND.  Active BS  Extremities: no CCE Neuro/Psych:  Oriented x 3.  Alert.  No gross deficits.   Intake/Output from previous day: 06/25 0701 - 06/26 0700 In: -  Out: 600 [Urine:600]  Intake/Output this shift:    Lab Results:  Recent Labs  11/17/15 1656  WBC 8.9  HGB 13.5  HCT 40.0  PLT 260   BMET  Recent Labs  11/17/15 1656 11/18/15 0545 11/19/15 0612  NA  --  136 140  K  --  3.6 3.4*  CL  --  102 107  CO2  --  23 23  GLUCOSE  --  83 77  BUN  --  16 18  CREATININE 1.12 1.05 0.88  CALCIUM  --  9.9 9.2   LFT No results for input(s): PROT, ALBUMIN, AST, ALT, ALKPHOS, BILITOT, BILIDIR, IBILI in the last 72 hours. PT/INR No results for input(s): LABPROT, INR in the last 72 hours. Hepatitis Panel No results for input(s): HEPBSAG, HCVAB, HEPAIGM, HEPBIGM in the last 72 hours.  Studies/Results: Dg Esophagus  11/20/2015  CLINICAL DATA:  Dysphagia. Soft food and medicine lodging in upper esophagus. History of upper esophageal stricture. EXAM: ESOPHOGRAM/BARIUM SWALLOW TECHNIQUE: Single contrast examination was performed using  thick barium. FLUOROSCOPY TIME:  Radiation Exposure Index (as provided by the fluoroscopic device): If the device does not provide the exposure index: Fluoroscopy Time:  0 minutes and 26 seconds.  Number of Acquired Images: COMPARISON:  03/11/2011. FINDINGS: Patient was given an initial swallow of thick barium. This immediately demonstrates a marked gated narrowing of the proximal esophagus (approximately the level of the transverse aorta) beyond which only a trace amount of barium passes. This creates a "String sign" over a several cm segment of the proximal thoracic esophagus. The patient immediately had symptoms of fullness and sticking sensation in the esophagus and vomited the barium almost immediately. Given that only minimal barium was able to pass beyond the stricture, the exam was stopped. IMPRESSION: Marked stricture/ narrowing of the proximal thoracic esophagus, at approximately level of the transverse aorta, beyond which only minimal barium passes. This is near the location of the stricture seen on previous imaging. Upper endoscopy recommended to further elucidate etiology. Electronically Signed   By: Misty Stanley M.D.   On: 11/20/2015 09:41   Scheduled Meds: . aspirin  300 mg Rectal Daily  . atorvastatin  40 mg Oral q1800  . feeding supplement (ENSURE ENLIVE)  237 mL Oral BID BM  . heparin subcutaneous  5,000 Units Subcutaneous Q8H   Continuous Infusions: . sodium chloride 75 mL/hr at 11/18/15 2200   PRN Meds:.acetaminophen (TYLENOL) oral liquid 160 mg/5 mL, hydrALAZINE, nicotine, ondansetron (ZOFRAN) IV  ASSESMENT:   * Chronic dysphagia in pt with hx benign upper esophageal sticture dilated by ENT in 04/2011. Has not followed up with ENT or with GI. Losing weight as unable to swallow. 6/26 Esophagram confirms stricture at proximal esophagus.    * Acute left PCA territory embolic CVA, source undefined. CT confirms old CVA as well. Stroke team mentions pursuing TEE but this may not be possible given dysphagia and likely recurrent UES stricture.  I do not see orders for TEE or LE dopplers as mentioned in stroke team note.  His neuro exam is benign. On full dose ASA,  sub Q heparin.  Plan at discharge is ASA.   * Non-compliance with meds and follow ups, ran out of Rx and his PMD died. He has no insurance and limited funds.     PLAN   *  EGD, dilation of stricture.  Timing per Dr Leonie Green  11/20/2015, 9:59 AM Pager: [phone]   Attending physician's note   I have taken a history, examined the patient and reviewed the chart. I agree with the Advanced Practitioner's note, impression and recommendations. 84 yr M admitted with acute left PCA territory stroke on 11/17/15. He also has severe stenosis of left PCA. He underwent cardiac echo that showed LV EF 35-40% with mild dilation and concentric hypertrophy and diffuse hypokinesis. Negative bubble study for ASD or PFO. Barium esophagogram showed tight narrowing/possible stricture in proximal 1/3rd esophagus with dilation of the esophagus proximal to the stricture. Patient has h/o chronic dysphagia and has had Savary dilation to 4mm in December 2012 and was lost to follow up subsequently.   Patient has facial asssymetry and slurred speech, per family its worse today compared to past 2 days. ? evolving from the stroke. Will ask neurology to evaluate. Cardiology placed loop recorder. He is at high risk for sedation and procedural related complications. Could have sedation related hypotension which can potentially worsen his condition and is also at high risk for bleeding with esophageal dilation with full dose Aspirin 325mg .  Discussed the benefits and also potential risks with sedation and EGD with esophageal dilation, patient and family prefer to wait at this point. Will arrange for outpatient follow up in 3 months and plan to proceed with EGD after he gets clearance from neurology and cardiology Continue full liquid diet, small frequent meals Calorie count Nutrition supplements ( Resource 1 can TID) I  Damaris Hippo, MD [phone] Mon-Fri 8a-5p [phone] after 5p, weekends, holidays

## 2015-11-20 NOTE — Care Management Note (Signed)
Case Management Note  Patient Details  Name: Gregory Perry MRN: [id number] Date of Birth: 03/14/1951  Subjective/Objective:     Pt admitted with CVA. He is from home with [name]. Pt has no PCP and no insurance.                Action/Plan: PT rec is for [facility] and OT is for outpatient therapy. CM met with the patient and [name] and they asked about assistance with the patients medications and acquiring a PCP. CM asked if he would be interested in being set up with the [facility]. Patient was very interested. CM was able to obtain him an appointment at the [facility] that is seeing overflow for the [facility]. CM went over with the patient and his sister the use of the pharmacy at the [facility] to assist him with his medications after discharge. CM will continue to follow for further d/c needs.   Expected Discharge Date:  11/18/15               Expected Discharge Plan:     In-House Referral:     Discharge planning Services     Post Acute Care Choice:    Choice offered to:     DME Arranged:    DME Agency:     HH Arranged:    HH Agency:     Status of Service:  In process, will continue to follow  If discussed at Long Length of Stay Meetings, dates discussed:    Additional Comments:  Gregory Friar, RN 11/20/2015, 10:26 AM

## 2015-11-20 NOTE — Consult Note (Signed)
CARDIOLOGY CONSULT NOTE       Patient ID: Gregory Perry MRN: [id number] DOB/AGE: 09-19-1950 65 y.o.  Admit date: 11/16/2015 Referring Physician:  Tat Primary Physician: PROVIDER NOT Ettrick Primary Cardiologist:  Lovena Le Reason for Consultation:  Pre op  Principal Problem:   Embolic stroke [facility]) Active Problems:   Dysphasia   Essential hypertension   GERD (gastroesophageal reflux disease)   Loss of weight   Tobacco abuse   Dysphagia   Cerebral infarction due to thrombosis of right posterior cerebral artery (HCC)   History of stroke   Cardiomyopathy, ischemic   Protein-calorie malnutrition, severe   Esophageal stricture   HPI:  65 y.o. admitted with CVA.  Left PCI with occipital infarct on MRI Minor visual field cut and mild slurring of speech No motor deficits.  Smoking and drinking daily prior to admission Has had chronic dysphagia with attempt at stricture dilatation 2012.  Has lost 30 lbs in last year. Barrium swallow confirms very tight stricture. No cardiac symptoms As part of stroke W/u had Echo which I reviewed.  EF 30-35% diffuse hypokinesis no defined RWMA;s  No history of chest pain outside of dysphagia symptoms. ECG with no old MI.  Apparently GI Plans on doing endoscopy.  BP has been on high side since stroke.  No TEE done as part of stroke w/u due to dysphagia and abnormal barrium swallow Dr Lovena Le has inserted  A loop recorder to r/o PAF    ROS All other systems reviewed and negative except as noted above  Past Medical History  Diagnosis Date  . Hypertension   . Dysphagia   . Headache(784.0)     history of    History reviewed. No pertinent family history.  Social History   Social History  . Marital Status: Widowed    Spouse Name: N/A  . Number of Children: N/A  . Years of Education: N/A   Occupational History  . Not on file.   Social History Main Topics  . Smoking status: Current Every Day Smoker -- 0.50 packs/day for 30 years    Types:  Cigarettes  . Smokeless tobacco: Not on file  . Alcohol Use: 1.8 oz/week    3 Shots of liquor per week     Comment: occassional  . Drug Use: Yes    Special: Marijuana  . Sexual Activity: Not on file   Other Topics Concern  . Not on file   Social History Narrative    Past Surgical History  Procedure Laterality Date  . No past surgeries    . Esophagogastroduodenoscopy  04/29/2011    Procedure: ESOPHAGOGASTRODUODENOSCOPY (EGD);  Surgeon: Winfield Cunas., MD;  Location: [facility];  Service: Endoscopy;  Laterality: N/A;  . Direct laryngoscopy  05/07/2011    Procedure: DIRECT LARYNGOSCOPY;  Surgeon: Rozetta Nunnery, MD;  Location: [place];  Service: ENT;  Laterality: N/A;  ESOPHAGOSCOPY  WITH DILATION  . Ep implantable device N/A 11/17/2015    Procedure: Loop Recorder Insertion;  Surgeon: Evans Lance, MD;  Location: [place] [facility];  Service: Cardiovascular;  Laterality: N/A;     . amLODipine  5 mg Oral Daily  . aspirin  300 mg Rectal Daily  . atorvastatin  40 mg Oral q1800  . feeding supplement  1 Container Oral TID BM  . feeding supplement (ENSURE ENLIVE)  237 mL Oral BID BM  . heparin subcutaneous  5,000 Units Subcutaneous Q8H      Physical Exam: Blood pressure 173/101,  pulse 75, temperature 98.4 F (36.9 C), temperature source Oral, resp. rate 17, height 5\' 11"  (1.803 m), weight 142 lb 1.6 oz (64.456 kg), SpO2 100 %.   Affect appropriate Chronically ill black male  HEENT: normal Neck supple with no adenopathy JVP normal no bruits no thyromegaly Lungs clear with no wheezing and good diaphragmatic motion Heart:  S1/S2 no murmur, no rub, gallop or click PMI normal Abdomen: benighn, BS positve, no tenderness, no AAA no bruit.  No HSM or HJR Distal pulses intact with no bruits No edema Neuro field cut mild facial asymmetry  Skin warm and dry No muscular weakness   Labs:   Lab Results  Component Value Date   WBC 8.9 11/17/2015   HGB 13.5 11/17/2015    HCT 40.0 11/17/2015   MCV 88.3 11/17/2015   PLT 260 11/17/2015    Recent Labs Lab 11/17/15 0426  11/19/15 0612  NA 139  < > 140  K 3.1*  < > 3.4*  CL 104  < > 107  CO2 24  < > 23  BUN 18  < > 18  CREATININE 0.89  < > 0.88  CALCIUM 9.3  < > 9.2  PROT 8.3*  --   --   BILITOT 1.3*  --   --   ALKPHOS 69  --   --   ALT 18  --   --   AST 36  --   --   GLUCOSE 114*  < > 77  < > = values in this interval not displayed. No results found for: CKTOTAL, CKMB, CKMBINDEX, TROPONINI  Lab Results  Component Value Date   CHOL 222* 11/17/2015   Lab Results  Component Value Date   HDL 73 11/17/2015   Lab Results  Component Value Date   LDLCALC 136* 11/17/2015   Lab Results  Component Value Date   TRIG 66 11/17/2015   Lab Results  Component Value Date   CHOLHDL 3.0 11/17/2015   No results found for: LDLDIRECT    Radiology: Dg Chest 2 View  11/17/2015  CLINICAL DATA:  65 year old male with hypertension EXAM: CHEST  2 VIEW COMPARISON:  Chest radiograph dated 05/07/2011 FINDINGS: The heart size and mediastinal contours are within normal limits. Both lungs are clear. The visualized skeletal structures are unremarkable. IMPRESSION: No active cardiopulmonary disease. Electronically Signed   By: Anner Crete M.D.   On: 11/17/2015 00:45   Ct Head Wo Contrast  11/17/2015  CLINICAL DATA:  Acute onset of lightheadedness. Unsteady gait. High blood pressure. Initial encounter. EXAM: CT HEAD WITHOUT CONTRAST TECHNIQUE: Contiguous axial images were obtained from the base of the skull through the vertex without intravenous contrast. COMPARISON:  CT of the neck performed 05/01/2011 FINDINGS: There is an evolving large acute or subacute infarct involving the left occipital lobe, extending into the posterior aspect of the left temporal lobe. There is mild mass effect, without significant midline shift. No definite hemorrhagic transformation is noted, though there is slight irregularity about the left  transverse sinus as it passes posterior to the infarct. Mild cerebellar atrophy is noted. Scattered periventricular and subcortical white matter change likely reflects small vessel ischemic microangiopathy. A chronic infarct is noted at the high right parietal lobe, with associated encephalomalacia. Prominence of the ventricles and sulci reflects mild cortical volume loss. The brainstem and fourth ventricle are within normal limits. There is no evidence of fracture; visualized osseous structures are unremarkable in appearance. The orbits are within normal limits. The paranasal  sinuses and mastoid air cells are well-aerated. No significant soft tissue abnormalities are seen. IMPRESSION: 1. Evolving large acute or subacute infarct involving the left occipital lobe, extending into the posterior aspect of the left temporal lobe. Mild mass effect, without significant midline shift. No definite hemorrhagic transformation is seen, though there is slight irregularity of the left transverse sinus as it passes posterior to the infarct. 2. Chronic infarct at the high right parietal lobe, with associated encephalomalacia. 3. Mild cortical volume loss and scattered small vessel ischemic microangiopathy. These results were called by telephone at the time of interpretation on 11/17/2015 at 2:05 am to Dr. Veatrice Kells, who verbally acknowledged these results. Electronically Signed   By: Garald Balding M.D.   On: 11/17/2015 02:07   Ct Angio Neck W Or Wo Contrast  11/17/2015  CLINICAL DATA:  Stroke EXAM: CT ANGIOGRAPHY NECK TECHNIQUE: Multidetector CT imaging of the neck was performed using the standard protocol during bolus administration of intravenous contrast. Multiplanar CT image reconstructions and MIPs were obtained to evaluate the vascular anatomy. Carotid stenosis measurements (when applicable) are obtained utilizing NASCET criteria, using the distal internal carotid diameter as the denominator. CONTRAST:  50 mL Isovue  370 IV COMPARISON:  .MRI head 11/17/2015 FINDINGS: Aortic arch: Normal aortic arch.  Proximal great vessels are normal. Right carotid system: Right common carotid artery widely patent. Minimal atherosclerotic calcification of the carotid bifurcation without significant stenosis or dissection Left carotid system: Left common carotid artery widely patent. Mild atherosclerotic calcification of the carotid bifurcation without stenosis. No dissection. Vertebral arteries:Left vertebral dominant. Both vertebral arteries patent without stenosis or dissection. Both vertebral arteries contribute to the basilar. Skeleton: Moderate cervical spine degenerative changes with spondylosis diffusely. No fracture or bone lesion. Other neck: .  Apical blebs.  No apical mass or infiltrate. IMPRESSION: No significant carotid or vertebral artery stenosis in the neck. Minimal atherosclerotic disease at the carotid bifurcation bilaterally. Electronically Signed   By: Franchot Gallo M.D.   On: 11/17/2015 10:57   Mr Brain Wo Contrast  11/17/2015  CLINICAL DATA:  Stroke EXAM: MRI HEAD WITHOUT CONTRAST MRA HEAD WITHOUT CONTRAST TECHNIQUE: Multiplanar, multiecho pulse sequences of the brain and surrounding structures were obtained without intravenous contrast. Angiographic images of the head were obtained using MRA technique without contrast. COMPARISON:  CT head 11/17/2015 FINDINGS: MRI HEAD FINDINGS Acute left posterior cerebral artery infarct. Restricted diffusion in the left posterior temporal and occipital lobe. Small area of acute infarct in the left thalamus. No associated hemorrhage. No other areas of acute infarct Chronic infarct right parietal cortex. Chronic microvascular ischemic changes throughout the white matter. Chronic infarct left caudate and right thalamus. Slight chronic ischemia left pons. Negative for intracranial hemorrhage. Negative for mass or edema. No shift of the midline structures. Paranasal sinuses clear.  Normal  orbit.  Normal pituitary. MRA HEAD FINDINGS Left vertebral artery dominant. Both vertebral arteries contribute to the basilar. Prominent AICA with moderate stenosis of the origin of the left AICA. Basilar widely patent. Superior cerebellar arteries patent bilaterally. Moderate stenosis of the right posterior cerebral artery. Severe stenosis left posterior cerebral artery with better flow distally. This is the area of acute infarct. Cavernous carotid shows mild irregularity. Moderate focal stenosis left cavernous carotid. Right cavernous carotid patent. Anterior and middle cerebral arteries patent bilaterally. There is artifact in the left M1 segment. No large vessel occlusion or flow limiting stenosis. Negative for cerebral aneurysm. IMPRESSION: Acute infarct left PCA territory.  No hemorrhage Extensive chronic ischemic  changes. Severe stenosis left posterior cerebral artery. Moderate stenosis right posterior cerebral artery. Electronically Signed   By: Franchot Gallo M.D.   On: 11/17/2015 07:28   Dg Esophagus  11/20/2015  CLINICAL DATA:  Dysphagia. Soft food and medicine lodging in upper esophagus. History of upper esophageal stricture. EXAM: ESOPHOGRAM/BARIUM SWALLOW TECHNIQUE: Single contrast examination was performed using  thick barium. FLUOROSCOPY TIME:  Radiation Exposure Index (as provided by the fluoroscopic device): If the device does not provide the exposure index: Fluoroscopy Time:  0 minutes and 26 seconds. Number of Acquired Images: COMPARISON:  03/11/2011. FINDINGS: Patient was given an initial swallow of thick barium. This immediately demonstrates a marked gated narrowing of the proximal esophagus (approximately the level of the transverse aorta) beyond which only a trace amount of barium passes. This creates a "String sign" over a several cm segment of the proximal thoracic esophagus. The patient immediately had symptoms of fullness and sticking sensation in the esophagus and vomited the barium  almost immediately. Given that only minimal barium was able to pass beyond the stricture, the exam was stopped. IMPRESSION: Marked stricture/ narrowing of the proximal thoracic esophagus, at approximately level of the transverse aorta, beyond which only minimal barium passes. This is near the location of the stricture seen on previous imaging. Upper endoscopy recommended to further elucidate etiology. Electronically Signed   By: Misty Stanley M.D.   On: 11/20/2015 09:41   Mr Jodene Nam Head/brain Wo Cm  11/17/2015  CLINICAL DATA:  Stroke EXAM: MRI HEAD WITHOUT CONTRAST MRA HEAD WITHOUT CONTRAST TECHNIQUE: Multiplanar, multiecho pulse sequences of the brain and surrounding structures were obtained without intravenous contrast. Angiographic images of the head were obtained using MRA technique without contrast. COMPARISON:  CT head 11/17/2015 FINDINGS: MRI HEAD FINDINGS Acute left posterior cerebral artery infarct. Restricted diffusion in the left posterior temporal and occipital lobe. Small area of acute infarct in the left thalamus. No associated hemorrhage. No other areas of acute infarct Chronic infarct right parietal cortex. Chronic microvascular ischemic changes throughout the white matter. Chronic infarct left caudate and right thalamus. Slight chronic ischemia left pons. Negative for intracranial hemorrhage. Negative for mass or edema. No shift of the midline structures. Paranasal sinuses clear.  Normal orbit.  Normal pituitary. MRA HEAD FINDINGS Left vertebral artery dominant. Both vertebral arteries contribute to the basilar. Prominent AICA with moderate stenosis of the origin of the left AICA. Basilar widely patent. Superior cerebellar arteries patent bilaterally. Moderate stenosis of the right posterior cerebral artery. Severe stenosis left posterior cerebral artery with better flow distally. This is the area of acute infarct. Cavernous carotid shows mild irregularity. Moderate focal stenosis left cavernous  carotid. Right cavernous carotid patent. Anterior and middle cerebral arteries patent bilaterally. There is artifact in the left M1 segment. No large vessel occlusion or flow limiting stenosis. Negative for cerebral aneurysm. IMPRESSION: Acute infarct left PCA territory.  No hemorrhage Extensive chronic ischemic changes. Severe stenosis left posterior cerebral artery. Moderate stenosis right posterior cerebral artery. Electronically Signed   By: Franchot Gallo M.D.   On: 11/17/2015 07:28    EKG: ST rate 100 LVH biatrial enlargement    ASSESSMENT AND PLAN:  Preop:  Low risk endoscopic procedure with no clinical CHF or history of CAD.  Ok to proceed.  CVA:  BP allowed to stay a bit high and would continue this as may fall a bit with sedation for endoscopy Don't think there is excess risk of hypotension with either propofol or versed which can  be reversed with Romazacon.  This issue should be discussed between GI and neuro.   CHF:  Low EF on echo likely related to DCM ? From ETOH.  Will need to start ACE and beta blocker rather than norvasc in long run Dr Tanna Furry note indicated outpatient myovue which certainly can be done electively done the road  I think the EGD is important diagnostically as well to r/o esophageal cancer in male with ETOH/Smoking and 30 lb weight loss As well as need for improved nutrition.     SignedJenkins Rouge 11/20/2015, 4:30 PM

## 2015-11-21 DIAGNOSIS — I255 Ischemic cardiomyopathy: Secondary | ICD-10-CM

## 2015-11-21 LAB — BASIC METABOLIC PANEL
ANION GAP: 10 (ref 5–15)
BUN: 9 mg/dL (ref 6–20)
CALCIUM: 9.3 mg/dL (ref 8.9–10.3)
CO2: 25 mmol/L (ref 22–32)
CREATININE: 0.87 mg/dL (ref 0.61–1.24)
Chloride: 102 mmol/L (ref 101–111)
GLUCOSE: 79 mg/dL (ref 65–99)
Potassium: 3.2 mmol/L — ABNORMAL LOW (ref 3.5–5.1)
Sodium: 137 mmol/L (ref 135–145)

## 2015-11-21 LAB — MAGNESIUM: Magnesium: 1.6 mg/dL — ABNORMAL LOW (ref 1.7–2.4)

## 2015-11-21 MED ORDER — POTASSIUM CHLORIDE 10 MEQ/100ML IV SOLN
10.0000 meq | Freq: Once | INTRAVENOUS | Status: AC
  Administered 2015-11-21: 10 meq via INTRAVENOUS
  Filled 2015-11-21: qty 100

## 2015-11-21 MED ORDER — POTASSIUM CHLORIDE 10 MEQ/100ML IV SOLN
10.0000 meq | INTRAVENOUS | Status: AC
  Administered 2015-11-21: 10 meq via INTRAVENOUS
  Filled 2015-11-21: qty 100

## 2015-11-21 MED ORDER — MAGNESIUM SULFATE 2 GM/50ML IV SOLN
2.0000 g | Freq: Once | INTRAVENOUS | Status: AC
  Administered 2015-11-21: 2 g via INTRAVENOUS
  Filled 2015-11-21: qty 50

## 2015-11-21 MED ORDER — POTASSIUM CHLORIDE 10 MEQ/100ML IV SOLN
10.0000 meq | INTRAVENOUS | Status: AC
  Administered 2015-11-21 (×2): 10 meq via INTRAVENOUS
  Filled 2015-11-21 (×2): qty 100

## 2015-11-21 MED ORDER — LISINOPRIL 5 MG PO TABS
5.0000 mg | ORAL_TABLET | Freq: Every day | ORAL | Status: DC
  Administered 2015-11-21 – 2015-11-23 (×3): 5 mg via ORAL
  Filled 2015-11-21 (×4): qty 1

## 2015-11-21 MED ORDER — METOPROLOL TARTRATE 25 MG PO TABS
25.0000 mg | ORAL_TABLET | Freq: Two times a day (BID) | ORAL | Status: DC
  Administered 2015-11-22 – 2015-11-23 (×2): 25 mg via ORAL
  Filled 2015-11-21 (×3): qty 1

## 2015-11-21 NOTE — Progress Notes (Signed)
Daily Rounding Note  11/21/2015, 8:31 AM  LOS: 4 days   SUBJECTIVE:       Cardiologist, Dr Johnsie Cancel approves of pursuing EGD, esophageal dilatation.  See his note of 6/26 for details. Still waiting for neuro to weigh in on this.  Pt tolerating liquids.   OBJECTIVE:         Vital signs in last 24 hours:    Temp:  [98.2 F (36.8 C)-99.2 F (37.3 C)] 99.2 F (37.3 C) (06/27 0144) Pulse Rate:  [60-75] 74 (06/27 0144) Resp:  [16-18] 18 (06/27 0144) BP: (156-173)/(81-101) 156/94 mmHg (06/27 0144) SpO2:  [98 %-100 %] 98 % (06/27 0144) Last BM Date: 11/16/15 Filed Weights   11/17/15 0530  Weight: 64.456 kg (142 lb 1.6 oz)   General: thin, pleasant, comfortable.    Heart: s1s2, no murmur Chest: b/l clear Abdomen:soft NTND Extremities: No edema Neuro/Psych:  Facial assymetry Intake/Output from previous day:    Intake/Output this shift:    Lab Results: No results for input(s): WBC, HGB, HCT, PLT in the last 72 hours. BMET  Recent Labs  11/19/15 0612 11/21/15 0356  NA 140 137  K 3.4* 3.2*  CL 107 102  CO2 23 25  GLUCOSE 77 79  BUN 18 9  CREATININE 0.88 0.87  CALCIUM 9.2 9.3   LFT No results for input(s): PROT, ALBUMIN, AST, ALT, ALKPHOS, BILITOT, BILIDIR, IBILI in the last 72 hours. PT/INR No results for input(s): LABPROT, INR in the last 72 hours. Hepatitis Panel No results for input(s): HEPBSAG, HCVAB, HEPAIGM, HEPBIGM in the last 72 hours.  Studies/Results: Dg Esophagus  11/20/2015  CLINICAL DATA:  Dysphagia. Soft food and medicine lodging in upper esophagus. History of upper esophageal stricture. EXAM: ESOPHOGRAM/BARIUM SWALLOW TECHNIQUE: Single contrast examination was performed using  thick barium. FLUOROSCOPY TIME:  Radiation Exposure Index (as provided by the fluoroscopic device): If the device does not provide the exposure index: Fluoroscopy Time:  0 minutes and 26 seconds. Number of Acquired Images:  COMPARISON:  03/11/2011. FINDINGS: Patient was given an initial swallow of thick barium. This immediately demonstrates a marked gated narrowing of the proximal esophagus (approximately the level of the transverse aorta) beyond which only a trace amount of barium passes. This creates a "String sign" over a several cm segment of the proximal thoracic esophagus. The patient immediately had symptoms of fullness and sticking sensation in the esophagus and vomited the barium almost immediately. Given that only minimal barium was able to pass beyond the stricture, the exam was stopped. IMPRESSION: Marked stricture/ narrowing of the proximal thoracic esophagus, at approximately level of the transverse aorta, beyond which only minimal barium passes. This is near the location of the stricture seen on previous imaging. Upper endoscopy recommended to further elucidate etiology. Electronically Signed   By: Misty Stanley M.D.   On: 11/20/2015 09:41    Scheduled Meds: . amLODipine  5 mg Oral Daily  . aspirin  300 mg Rectal Daily  . atorvastatin  40 mg Oral q1800  . feeding supplement  1 Container Oral TID BM  . feeding supplement (ENSURE ENLIVE)  237 mL Oral BID BM  . heparin subcutaneous  5,000 Units Subcutaneous Q8H   Continuous Infusions:  PRN Meds:.acetaminophen (TYLENOL) oral liquid 160 mg/5 mL, hydrALAZINE, nicotine, ondansetron (ZOFRAN) IV   ASSESMENT:   * Chronic dysphagia in pt with hx benign upper esophageal sticture dilated by ENT in 04/2011. Has not followed up with ENT  or with GI. Losing weight as unable to swallow. 6/26 Esophagram confirms stricture at proximal esophagus.  Keep pt on full liquids and   * Acute left PCA territory embolic CVA, source undefined. CT confirms old CVA as well. Stroke team mentions pursuing TEE but this may not be possible given dysphagia and likely recurrent UES stricture.  I do not see orders for TEE or LE dopplers as mentioned in stroke team note.  His  neuro exam is benign. On full dose ASA, sub Q heparin. Plan at discharge is ASA.    PLAN   *  Await neuro input/clearance for EGD/esophageal dilatation.  If they approve, may go ahead and set up for 6/28.      Azucena Freed  11/21/2015, 8:31 AM Pager: [phone]   Attending physician's note   I have taken an interval history, reviewed the chart and examined the patient. I agree with the Advanced Practitioner's note, impression and recommendations. Given the chronicity of the stricture >5 years and his chronic symptoms unlikely to be esophageal cancer or malignant stricture. He is tolerating full liquid diet. Likely etiology benign stricture, web vs eosinophilic esophagitis. Please do a Calorie count.  Await neurology clearance and risk assessment prior to proceeding with EGD and possible dilation given recent acute stroke ~4 days ago.  Patient is aware of potential risks associated with procedure  K Denzil Magnuson, MD [phone] Mon-Fri 8a-5p [phone] after 5p, weekends, holidays

## 2015-11-21 NOTE — Progress Notes (Signed)
Patient ID: Gregory Perry, male   DOB: [birth year], 65 y.o.   MRN: [id number]    Subjective:  Denies SSCP, palpitations or Dyspnea Taking soft solids   Objective:  Filed Vitals:   11/20/15 1827 11/20/15 2218 11/21/15 0144 11/21/15 0845  BP: 166/92 164/81 156/94 137/92  Pulse: 60 64 74 92  Temp: 98.8 F (37.1 C) 98.6 F (37 C) 99.2 F (37.3 C) 98.6 F (37 C)  TempSrc: Oral Oral Oral Oral  Resp: 17 18 18 16   Height:      Weight:      SpO2: 100% 100% 98% 100%    Intake/Output from previous day: No intake or output data in the 24 hours ending 11/21/15 0936  Physical Exam: Affect appropriate Thin black male  HEENT:small field cut mild facial asymetry Neck supple with no adenopathy JVP normal no bruits no thyromegaly Lungs clear with no wheezing and good diaphragmatic motion Heart:  S1/S2 no murmur, no rub, gallop or click PMI normal Abdomen: benighn, BS positve, no tenderness, no AAA no bruit.  No HSM or HJR Distal pulses intact with no bruits No edema Neuro non-focal Skin warm and dry No muscular weakness   Lab Results: Basic Metabolic Panel:  Recent Labs  11/19/15 0612 11/21/15 0356  NA 140 137  K 3.4* 3.2*  CL 107 102  CO2 23 25  GLUCOSE 77 79  BUN 18 9  CREATININE 0.88 0.87  CALCIUM 9.2 9.3  MG  --  1.6*    Imaging: Dg Esophagus  11/20/2015  CLINICAL DATA:  Dysphagia. Soft food and medicine lodging in upper esophagus. History of upper esophageal stricture. EXAM: ESOPHOGRAM/BARIUM SWALLOW TECHNIQUE: Single contrast examination was performed using  thick barium. FLUOROSCOPY TIME:  Radiation Exposure Index (as provided by the fluoroscopic device): If the device does not provide the exposure index: Fluoroscopy Time:  0 minutes and 26 seconds. Number of Acquired Images: COMPARISON:  03/11/2011. FINDINGS: Patient was given an initial swallow of thick barium. This immediately demonstrates a marked gated narrowing of the proximal esophagus (approximately the  level of the transverse aorta) beyond which only a trace amount of barium passes. This creates a "String sign" over a several cm segment of the proximal thoracic esophagus. The patient immediately had symptoms of fullness and sticking sensation in the esophagus and vomited the barium almost immediately. Given that only minimal barium was able to pass beyond the stricture, the exam was stopped. IMPRESSION: Marked stricture/ narrowing of the proximal thoracic esophagus, at approximately level of the transverse aorta, beyond which only minimal barium passes. This is near the location of the stricture seen on previous imaging. Upper endoscopy recommended to further elucidate etiology. Electronically Signed   By: Misty Stanley M.D.   On: 11/20/2015 09:41    Cardiac Studies:  ECG: ST rate 199 LVH    Telemetry:  NSR 11/21/2015   Echo: Study Conclusions  - Left ventricle: The cavity size was mildly dilated. There was  mild concentric hypertrophy. Systolic function was moderately  reduced. The estimated ejection fraction was in the range of 35%  to 40%. Diffuse hypokinesis. Doppler parameters are consistent  with abnormal left ventricular relaxation (grade 1 diastolic  dysfunction). There was no evidence of elevated ventricular  filling pressure by Doppler parameters. - Aortic valve: There was trivial regurgitation. - Aortic root: The aortic root was normal in size. - Mitral valve: Structurally normal valve. - Right ventricle: Systolic function was normal. - Right atrium: The atrium was normal in  size. - Atrial septum: No defect or patent foramen ovale was identified. - Pulmonary arteries: Systolic pressure was within the normal  range. - Inferior vena cava: The vessel was normal in size. - Pericardium, extracardiac: There was no pericardial effusion.  Impressions:  - Negative bubble syudy. No evidence for ASD or PFO.  Medications:   . amLODipine  5 mg Oral Daily  . aspirin  300 mg  Rectal Daily  . atorvastatin  40 mg Oral q1800  . feeding supplement  1 Container Oral TID BM  . feeding supplement (ENSURE ENLIVE)  237 mL Oral BID BM  . heparin subcutaneous  5,000 Units Subcutaneous Q8H       Assessment/Plan:  CVA:  No SOE on TTE with negative bubble Post ILR implantation Dr Lovena Le. No TEE due to dysphagia and esophageal pathology ASA per neuro CHF:  Low EF on echo likely from ETOH.  After he recovers from acute CVA can have lexiscan myovue Start ACE and beta blocker As outpatient GI:  Cleared from our perspective to have EGD in am low risk of hypotension making watershed stroke issues worse. Need to r/o Esophageal cancer and improve ability to eat given protein calorie malnutrition and 30 lb weight loss  Jenkins Rouge 11/21/2015, 9:36 AM

## 2015-11-21 NOTE — Progress Notes (Signed)
STROKE TEAM PROGRESS NOTE    SUBJECTIVE (INTERVAL HISTORY)  multiple family members are at the bedside. .Patient and family and following that is clearly has had chronic dysphagia and has been unable to eat enough and has been losing weight and will benefit from esophageal dilatation. I explained to him that he Is a small risk of having a stroke while he has to come off of antiplatelet agents for the esophageal dilatation procedure. He voiced understanding of the risk and is willing to take it   OBJECTIVE Temp:  [98.4 F (36.9 C)-99.2 F (37.3 C)] 98.6 F (37 C) (06/27 0845) Pulse Rate:  [60-92] 92 (06/27 0845) Cardiac Rhythm:  [-] Normal sinus rhythm (06/26 1600) Resp:  [16-18] 16 (06/27 0845) BP: (137-173)/(81-101) 137/92 mmHg (06/27 0845) SpO2:  [98 %-100 %] 100 % (06/27 [id number])   Basic Metabolic Panel:   Recent Labs Lab 11/18/15 0545 11/19/15 0612 11/21/15 0356  NA 136 140 137  K 3.6 3.4* 3.2*  CL 102 107 102  CO2 23 23 25   GLUCOSE 83 77 79  BUN 16 18 9   CREATININE 1.05 0.88 0.87  CALCIUM 9.9 9.2 9.3  MG 1.8  --  1.6*    IMAGING No results found.    PHYSICAL EXAM General - Well nourished, well developed, in no apparent distress.  Ophthalmologic - Fundi not visualized due to small pupils.  Cardiovascular - Regular rate and rhythm.  Mental Status -  Level of arousal and orientation to time, place, and person were intact. Language including expression, repetition, comprehension was assessed and found intact, Fund of Knowledge was assessed and was intact  Cranial Nerves II - XII - II - right homonymous hemianopia. III, IV, VI - Extraocular movements intact. V - Facial sensation intact bilaterally. VII - Facial movement intact bilaterally. VIII - Hearing & vestibular intact bilaterally. X - Palate elevates symmetrically. XI - Chin turning & shoulder shrug intact bilaterally. XII - Tongue protrusion intact.  Motor Strength - The patient's strength was normal  in all extremities and pronator drift was absent.  Bulk was normal and fasciculations were absent.   Motor Tone - Muscle tone was assessed at the neck and appendages and was normal.  Reflexes - The patient's reflexes were 1+ in all extremities and he had no pathological reflexes.  Sensory - Light touch, temperature/pinprick were assessed and were symmetrical   Coordination - The patient had normal movements in the hands with no ataxia or dysmetria.  Tremor was absent.  Gait and Station - deferred.   ASSESSMENT/PLAN Mr. JAMMEL HANKO is a 65 y.o. male with history of HA and noncompliance presenting with lightheadedness and a fall at home. He did not receive IV t-PA due to unclear LKW.   Stroke:  left PCA territory acute infarct and chronic right parietal cortical infarct, embolic secondary to unknown source  Patient without change    Resultant right hemianopia, left LE decreased sensation  MRI  L PCA infarct acute, and chronic right parietal cortical infarct  MRA  Severe stenosis L PCA, moderated R PCA  CTA neck no significant stenosis  2D Echo  EF 35-40%  TEE not able to be done due to hx of esophagus striction  Loop placed  LDL 136  HgbA1c 4.9  SCDs for VTE prophylaxis Diet full liquid Room service appropriate?: Yes; Fluid consistency:: Thin Diet - low sodium heart healthy  No antithrombotic prior to admission, now on aspirin 300 mg suppository daily. Ok to change to  aspirin 325 mg po daily once able to swallow without difficulty. Continue ASA on discharge.   Ongoing aggressive stroke risk factor management  Therapy recommendations:  OP OT, PT pending   Pt can not drive at this time. I discussed with pt and his wife.  Disposition:  Return home  Cardiomyopathy dilated  EF 35-40%  Cardiology recommend myoview to rule out ischemia  Need outpt follow up with cardiology  Loop recorder placed  Card recs appreciated  Hx of esophagus stricture and dysphagia    Chronic dysphagia  S/p dilation by ENT in 2012  Having trouble swallowing - wants dilatation now  There is a small risk with stopping antiplatelet for procedure, this was discussed with pt/family. He is willing to take risk for dilatation. Neurology has cleared for procedure  Hypertension  BP 178/114 on arrival in setting of neurologic symptoms  Stable now Long-term BP goal normotensive  Hyperlipidemia  Home meds:  No statin  LDL 136, goal < 70  Added lipitor 40  Continue statin at discharge  Tobacco abuse  Current smoker  Smoking cessation counseling provided  Nicotine patch provided  Pt is willing to quit  Other Stroke Risk Factors  ETOH use, advised to drink no more than 2 drink(s) a day  Other Active Problems  Unexplained weight loss  tachycardia  Hospital day # [place] for Pager information 11/21/2015 12:04 PM  I have personally examined this patient, reviewed notes, independently viewed imaging studies, participated in medical decision making and plan of care. I have made any additions or clarifications directly to the above note. Agree with note above. Patient has had a recent embolic stroke and remains at risk for recurrent stroke, TIA neurological worsening but he also has severe esophageal stricture which has limited his ability to eat and he is been losing weight and will benefit from esophageal dilatation. The risk-benefit at the present time  Is in favor of holding antiplatelet agents for a few days and doing this esophageal dilatation. I explained this to the patient and family members who voiced understanding and willing to take the small risk of stroke while antiplatelet therapy is temporarily on hold for the esophageal dilatation procedure. Greater than 50% time during this 25 minute visit was spent on counseling and coordination of care about stroke risk and treatment  Antony Contras, MD Medical  Director [place] Pager: [phone] 11/21/2015 4:03 PM   To contact Stroke Continuity provider, please refer to [email or web address] After hours, contact General Neurology

## 2015-11-21 NOTE — Progress Notes (Signed)
Occupational Therapy Treatment Patient Details Name: Gregory Perry MRN: [id number] DOB: Dec 01, 1950 Today's Date: 11/21/2015    History of present illness 65 y.o. male admitted for near syncope, fall and HTN. MRI (+) acute/subacute large L PCA territory infarct and remote parietal infarct. PMH significant for HTN, dysphagia.   OT comments  Pt progressing well and demonstrates improved use scanning strategies during functional tasks. Continued practicing compensatory visual strategies including scanning, saccadic eye movement training using functional tasks that are meaningful to the pt including playing cards and dice and reading sports magazines. Will continue to reinforce strategies in future session to apply to other functional daily tasks.    Follow Up Recommendations  Outpatient OT;Supervision - Intermittent    Equipment Recommendations  None recommended by OT    Recommendations for Other Services      Precautions / Restrictions Precautions Precautions: Fall Precaution Comments: R homonymous heminanopsia Restrictions Weight Bearing Restrictions: No       Mobility Bed Mobility               General bed mobility comments: Pt up in chair on OT arrival  Transfers Overall transfer level: Modified independent Equipment used: None                  Balance Overall balance assessment: Needs assistance Sitting-balance support: No upper extremity supported;Feet supported Sitting balance-Leahy Scale: Normal     Standing balance support: No upper extremity supported;During functional activity Standing balance-Leahy Scale: Good                     ADL Overall ADL's : Modified independent                                       General ADL Comments: Focus of session on vision rehab for R homonymous hemianopsia. Practiced scanning, saccadic eye movement training, and applying compensatory strategies to functional activities (e.g. card  playing, searching for items in room)      Vision Eye Alignment: Within Functional Limits Alignment/Gaze Preference: Within Defined Limits Ocular Range of Motion: Within Functional Limits Tracking/Visual Pursuits: Able to track stimulus in all quads without difficulty Saccades: Decreased speed of saccadic movement Convergence: Within functional limits   Depth Perception: Overshoots Additional Comments: Practiced scanning, saccadic eye movement training, and applying compensatory strategies to functional activities (e.g. card playing, searching for items in room)   Perception     Praxis      Cognition   Behavior During Therapy: [facility] for tasks assessed/performed Overall Cognitive Status: Within Functional Limits for tasks assessed                       Extremity/Trunk Assessment               Exercises     Shoulder Instructions       General Comments      Pertinent Vitals/ Pain       Pain Assessment: No/denies pain  Home Living                                          Prior Functioning/Environment              Frequency Min 3X/week     Progress Toward Goals  OT  Goals(current goals can now be found in the care plan section)  Progress towards OT goals: Progressing toward goals  Acute Rehab OT Goals Patient Stated Goal: to go home OT Goal Formulation: With patient Time For Goal Achievement: 12/01/15 Potential to Achieve Goals: Good ADL Goals Pt Will Perform Tub/Shower Transfer: Tub transfer;with modified independence;ambulating Additional ADL Goal #1: Pt will independently incorporate visual compensatory strategies into daily tasks with min verbal cues.  Plan Discharge plan remains appropriate    Co-evaluation                 End of Session     Activity Tolerance Patient tolerated treatment well   Patient Left in chair;with call bell/phone within reach   Nurse Communication Mobility status        Time:  [id number] OT Time Calculation (min): 26 min  Charges: OT General Charges $OT Visit: 1 Procedure OT Treatments $Self Care/Home Management : 8-22 mins $Therapeutic Activity: 8-22 mins  Redmond Baseman, OTR/L Pager: [phone] 11/21/2015, 4:59 PM

## 2015-11-21 NOTE — Progress Notes (Signed)
PROGRESS NOTE  Gregory Perry [id number] DOB: [birth year] DOA: 11/16/2015 PCBrief History:  65 year old male with a history of hypertension, tobacco abuse, alcohol abuse presents with one-day history of gait instability and fall. Upon further questioning, the patient did not have loss of consciousness. However, the patient stated that he "did not feel right"the day prior to admission. He denied any dysarthria, focal collection may weakness, loss of vision. Initial CT of the brain revealed evolving acute or subacute infarct in the left occipital lobe. MRI revealed a left PCA infarct. As result, neurology was consulted and full stroke workup was undertaken. In addition, the patient has been complaining of dysphagia and odynophagia for the last 2-3 years. He has had intermittent vomiting because he is not able to swallow food. In addition he has lost 30 pounds in the past 6-12 months. On 04/29/2011, the patient had an EGD performed by Dr. Oletta Lamas which revealed a stricture in the proximal esophagus with a bloody ulcer. There was difficulty advancing the scope at that time which required ENT to assist. Initially, the patient did not want to undergo the risk of EGD and esophageal dilatation given his recent stroke. However, after speaking with his brother, the patient change his mind. Old records reviewed and summarized. GI requested cardiology clearance as well as neurology to follow.  Assessment/Plan: Left PCA stroke -Appreciate Neurology Consult -PT/OT evaluation--> HHPT -Speech therapy eval--Dys 3 diet when able to tolerate -CT brain--evolving acute or subacute left occipital lobe infarct -MRI-acute left PCA infarct -MRA brain---severe stenosis left posterior cerebral artery, artery stenosis right posterior cerebral artery -CTA neck--no significant carotid or vertebral stenosis -Echo---EF 35-40%; diffuse HK, grade 1 DD, negative bubble study -LDL--136 -HbA1C--4.9 -11/17/15--loop  recorder -Continue aspirin suppository until able to tolerate po -right homonymous hemianopia--cannot drive until he has formal driving eval at [facility]  Dysphagia/Esophageal stricture -appreciate GI help -having difficulty tolerating full liquids-->vomit -downgrade to clear liquids -6/26 Esophagram--marked stricture in the proximal thoracic esophagus at the level of the transverse aorta -may need ENT help -continue IVF -The patient has changes mind and wants endoscopy and dilatation -GI has requested cardiac clearance and neurology to follow--cardiology has cleared the patient. -I have reconsulted stroke team--spoke with Dr. Leonie Man -11/22/15--EGD planned if neurology does not object  Hypertension -Allow for permissive hypertension, then gradually normalize over the next 5-7 days -hydralazine for SBP >220 or DBP >120 - d/c amlodipine  -start metoprolol and lisinopril  Alcohol and tobacco abuse -CIWA--no signs or symptoms of withdrawal -NicoDerm patch -Cessation discussed -Last drink 4 days prior to admission -Patient drinks 2 beers with shot of vodka daily  Cardiomyopathy -start metoprolol and lisinopril -d/c amlodipine  -will need myoview in outpt setting -Echo---EF 35-40%; diffuse HK, grade 1 DD, negative bubble study  Tachycardia -11/17/15-Patient had 3-4 episodes of tachycardia up to 140 fasting one to 2 minutes -Patient was asymptomatic -telemetry review suggest possible atrial tachycardia -Plan to start low-dose beta blocker if continues -loop recorder placed  Severe protein calorie malnutrition -Continue ensure  Hypokalemia/Hypomagnesemia -repleted   Disposition Plan: Home in 1-2 days  Family Communication: Sisters update at bedside 6/27 Barrier--EGD with dilatation planned for 6/28   Consultants: Neurology, cardiology, [place] [facility]  Code Status: FULL  DVT Prophylaxis: [place] Heparin   Procedures: As Listed in Progress Note Above  Antibiotics: NoneP:  PROVIDER NOT IN SYSTEM     Subjective: Still having some difficulty swallowing pills but no vomiting.  Patient denies  fevers, chills, headache, chest pain, dyspnea, nausea, vomiting, diarrhea, abdominal pain, dysuria, hematuria   Objective: Filed Vitals:   11/20/15 1827 11/20/15 2218 11/21/15 0144 11/21/15 0845  BP: 166/92 164/81 156/94 137/92  Pulse: 60 64 74 92  Temp: 98.8 F (37.1 C) 98.6 F (37 C) 99.2 F (37.3 C) 98.6 F (37 C)  TempSrc: Oral Oral Oral Oral  Resp: 17 18 18 16   Height:      Weight:      SpO2: 100% 100% 98% 100%   No intake or output data in the 24 hours ending 11/21/15 1138 Weight change:  Exam:   General:  Pt is alert, follows commands appropriately, not in acute distress  HEENT: No icterus, No thrush, No neck mass, [place]/AT  Cardiovascular: RRR, S1/S2, no rubs, no gallops  Respiratory: CTA bilaterally, no wheezing, no crackles, no rhonchi  Abdomen: Soft/+BS, non tender, non distended, no guarding  Extremities: No edema, No lymphangitis, No petechiae, No rashes, no synovitis   Data Reviewed: I have personally reviewed following labs and imaging studies Basic Metabolic Panel:  Recent Labs Lab 11/16/15 2101 11/17/15 0426 11/17/15 1656 11/18/15 0545 11/19/15 0612 11/21/15 0356  NA 140 139  --  136 140 137  K 3.7 3.1*  --  3.6 3.4* 3.2*  CL 107 104  --  102 107 102  CO2 22 24  --  23 23 25   GLUCOSE 106* 114*  --  83 77 79  BUN 17 18  --  16 18 9   CREATININE 0.95 0.89 1.12 1.05 0.88 0.87  CALCIUM 9.9 9.3  --  9.9 9.2 9.3  MG  --   --   --  1.8  --  1.6*   Liver Function Tests:  Recent Labs Lab 11/17/15 0426  AST 36  ALT 18  ALKPHOS 69  BILITOT 1.3*  PROT 8.3*  ALBUMIN 4.2   No results for input(s): LIPASE, AMYLASE in the last 168 hours. No results for input(s): AMMONIA in the last 168 hours. Coagulation Profile: No results for input(s): INR, PROTIME in the last 168 hours. CBC:  Recent Labs Lab 11/16/15 2101  11/17/15 0426 11/17/15 1656  WBC 9.5 8.3 8.9  NEUTROABS  --  5.7  --   HGB 15.3 14.0 13.5  HCT 43.9 40.6 40.0  MCV 88.5 88.1 88.3  PLT 267 278 260   Cardiac Enzymes: No results for input(s): CKTOTAL, CKMB, CKMBINDEX, TROPONINI in the last 168 hours. BNP: Invalid input(s): POCBNP CBG:  Recent Labs Lab 11/16/15 2346  GLUCAP 96   HbA1C: No results for input(s): HGBA1C in the last 72 hours. Urine analysis:    Component Value Date/Time   COLORURINE AMBER* 11/16/2015 2330   APPEARANCEUR CLOUDY* 11/16/2015 2330   LABSPEC 1.032* 11/16/2015 2330   PHURINE 5.5 11/16/2015 2330   GLUCOSEU NEGATIVE 11/16/2015 2330   HGBUR MODERATE* 11/16/2015 2330   BILIRUBINUR MODERATE* 11/16/2015 2330   KETONESUR 40* 11/16/2015 2330   PROTEINUR 100* 11/16/2015 2330   NITRITE NEGATIVE 11/16/2015 2330   LEUKOCYTESUR NEGATIVE 11/16/2015 2330   Sepsis Labs: @LABRCNTIP (procalcitonin:4,lacticidven:4) )No results found for this or any previous visit (from the past 240 hour(s)).   Scheduled Meds: . aspirin  300 mg Rectal Daily  . atorvastatin  40 mg Oral q1800  . feeding supplement  1 Container Oral TID BM  . feeding supplement (ENSURE ENLIVE)  237 mL Oral BID BM  . heparin subcutaneous  5,000 Units Subcutaneous Q8H  . lisinopril  5 mg Oral  Daily  . magnesium sulfate 1 - 4 g bolus IVPB  2 g Intravenous Once  . [START ON 11/22/2015] metoprolol tartrate  25 mg Oral BID  . potassium chloride  10 mEq Intravenous Q1 Hr x 4   Continuous Infusions:   Procedures/Studies: Dg Chest 2 View  11/17/2015  CLINICAL DATA:  65 year old male with hypertension EXAM: CHEST  2 VIEW COMPARISON:  Chest radiograph dated 05/07/2011 FINDINGS: The heart size and mediastinal contours are within normal limits. Both lungs are clear. The visualized skeletal structures are unremarkable. IMPRESSION: No active cardiopulmonary disease. Electronically Signed   By: Anner Crete M.D.   On: 11/17/2015 00:45   Ct Head Wo  Contrast  11/17/2015  CLINICAL DATA:  Acute onset of lightheadedness. Unsteady gait. High blood pressure. Initial encounter. EXAM: CT HEAD WITHOUT CONTRAST TECHNIQUE: Contiguous axial images were obtained from the base of the skull through the vertex without intravenous contrast. COMPARISON:  CT of the neck performed 05/01/2011 FINDINGS: There is an evolving large acute or subacute infarct involving the left occipital lobe, extending into the posterior aspect of the left temporal lobe. There is mild mass effect, without significant midline shift. No definite hemorrhagic transformation is noted, though there is slight irregularity about the left transverse sinus as it passes posterior to the infarct. Mild cerebellar atrophy is noted. Scattered periventricular and subcortical white matter change likely reflects small vessel ischemic microangiopathy. A chronic infarct is noted at the high right parietal lobe, with associated encephalomalacia. Prominence of the ventricles and sulci reflects mild cortical volume loss. The brainstem and fourth ventricle are within normal limits. There is no evidence of fracture; visualized osseous structures are unremarkable in appearance. The orbits are within normal limits. The paranasal sinuses and mastoid air cells are well-aerated. No significant soft tissue abnormalities are seen. IMPRESSION: 1. Evolving large acute or subacute infarct involving the left occipital lobe, extending into the posterior aspect of the left temporal lobe. Mild mass effect, without significant midline shift. No definite hemorrhagic transformation is seen, though there is slight irregularity of the left transverse sinus as it passes posterior to the infarct. 2. Chronic infarct at the high right parietal lobe, with associated encephalomalacia. 3. Mild cortical volume loss and scattered small vessel ischemic microangiopathy. These results were called by telephone at the time of interpretation on 11/17/2015 at  2:05 am to Dr. Veatrice Kells, who verbally acknowledged these results. Electronically Signed   By: Garald Balding M.D.   On: 11/17/2015 02:07   Ct Angio Neck W Or Wo Contrast  11/17/2015  CLINICAL DATA:  Stroke EXAM: CT ANGIOGRAPHY NECK TECHNIQUE: Multidetector CT imaging of the neck was performed using the standard protocol during bolus administration of intravenous contrast. Multiplanar CT image reconstructions and MIPs were obtained to evaluate the vascular anatomy. Carotid stenosis measurements (when applicable) are obtained utilizing NASCET criteria, using the distal internal carotid diameter as the denominator. CONTRAST:  50 mL Isovue 370 IV COMPARISON:  .MRI head 11/17/2015 FINDINGS: Aortic arch: Normal aortic arch.  Proximal great vessels are normal. Right carotid system: Right common carotid artery widely patent. Minimal atherosclerotic calcification of the carotid bifurcation without significant stenosis or dissection Left carotid system: Left common carotid artery widely patent. Mild atherosclerotic calcification of the carotid bifurcation without stenosis. No dissection. Vertebral arteries:Left vertebral dominant. Both vertebral arteries patent without stenosis or dissection. Both vertebral arteries contribute to the basilar. Skeleton: Moderate cervical spine degenerative changes with spondylosis diffusely. No fracture or bone lesion. Other neck: .  Apical blebs.  No apical mass or infiltrate. IMPRESSION: No significant carotid or vertebral artery stenosis in the neck. Minimal atherosclerotic disease at the carotid bifurcation bilaterally. Electronically Signed   By: Franchot Gallo M.D.   On: 11/17/2015 10:57   Mr Brain Wo Contrast  11/17/2015  CLINICAL DATA:  Stroke EXAM: MRI HEAD WITHOUT CONTRAST MRA HEAD WITHOUT CONTRAST TECHNIQUE: Multiplanar, multiecho pulse sequences of the brain and surrounding structures were obtained without intravenous contrast. Angiographic images of the head were  obtained using MRA technique without contrast. COMPARISON:  CT head 11/17/2015 FINDINGS: MRI HEAD FINDINGS Acute left posterior cerebral artery infarct. Restricted diffusion in the left posterior temporal and occipital lobe. Small area of acute infarct in the left thalamus. No associated hemorrhage. No other areas of acute infarct Chronic infarct right parietal cortex. Chronic microvascular ischemic changes throughout the white matter. Chronic infarct left caudate and right thalamus. Slight chronic ischemia left pons. Negative for intracranial hemorrhage. Negative for mass or edema. No shift of the midline structures. Paranasal sinuses clear.  Normal orbit.  Normal pituitary. MRA HEAD FINDINGS Left vertebral artery dominant. Both vertebral arteries contribute to the basilar. Prominent AICA with moderate stenosis of the origin of the left AICA. Basilar widely patent. Superior cerebellar arteries patent bilaterally. Moderate stenosis of the right posterior cerebral artery. Severe stenosis left posterior cerebral artery with better flow distally. This is the area of acute infarct. Cavernous carotid shows mild irregularity. Moderate focal stenosis left cavernous carotid. Right cavernous carotid patent. Anterior and middle cerebral arteries patent bilaterally. There is artifact in the left M1 segment. No large vessel occlusion or flow limiting stenosis. Negative for cerebral aneurysm. IMPRESSION: Acute infarct left PCA territory.  No hemorrhage Extensive chronic ischemic changes. Severe stenosis left posterior cerebral artery. Moderate stenosis right posterior cerebral artery. Electronically Signed   By: Franchot Gallo M.D.   On: 11/17/2015 07:28   Dg Esophagus  11/20/2015  CLINICAL DATA:  Dysphagia. Soft food and medicine lodging in upper esophagus. History of upper esophageal stricture. EXAM: ESOPHOGRAM/BARIUM SWALLOW TECHNIQUE: Single contrast examination was performed using  thick barium. FLUOROSCOPY TIME:   Radiation Exposure Index (as provided by the fluoroscopic device): If the device does not provide the exposure index: Fluoroscopy Time:  0 minutes and 26 seconds. Number of Acquired Images: COMPARISON:  03/11/2011. FINDINGS: Patient was given an initial swallow of thick barium. This immediately demonstrates a marked gated narrowing of the proximal esophagus (approximately the level of the transverse aorta) beyond which only a trace amount of barium passes. This creates a "String sign" over a several cm segment of the proximal thoracic esophagus. The patient immediately had symptoms of fullness and sticking sensation in the esophagus and vomited the barium almost immediately. Given that only minimal barium was able to pass beyond the stricture, the exam was stopped. IMPRESSION: Marked stricture/ narrowing of the proximal thoracic esophagus, at approximately level of the transverse aorta, beyond which only minimal barium passes. This is near the location of the stricture seen on previous imaging. Upper endoscopy recommended to further elucidate etiology. Electronically Signed   By: Misty Stanley M.D.   On: 11/20/2015 09:41   Mr Jodene Nam Head/brain Wo Cm  11/17/2015  CLINICAL DATA:  Stroke EXAM: MRI HEAD WITHOUT CONTRAST MRA HEAD WITHOUT CONTRAST TECHNIQUE: Multiplanar, multiecho pulse sequences of the brain and surrounding structures were obtained without intravenous contrast. Angiographic images of the head were obtained using MRA technique without contrast. COMPARISON:  CT head 11/17/2015 FINDINGS: MRI HEAD FINDINGS  Acute left posterior cerebral artery infarct. Restricted diffusion in the left posterior temporal and occipital lobe. Small area of acute infarct in the left thalamus. No associated hemorrhage. No other areas of acute infarct Chronic infarct right parietal cortex. Chronic microvascular ischemic changes throughout the white matter. Chronic infarct left caudate and right thalamus. Slight chronic ischemia  left pons. Negative for intracranial hemorrhage. Negative for mass or edema. No shift of the midline structures. Paranasal sinuses clear.  Normal orbit.  Normal pituitary. MRA HEAD FINDINGS Left vertebral artery dominant. Both vertebral arteries contribute to the basilar. Prominent AICA with moderate stenosis of the origin of the left AICA. Basilar widely patent. Superior cerebellar arteries patent bilaterally. Moderate stenosis of the right posterior cerebral artery. Severe stenosis left posterior cerebral artery with better flow distally. This is the area of acute infarct. Cavernous carotid shows mild irregularity. Moderate focal stenosis left cavernous carotid. Right cavernous carotid patent. Anterior and middle cerebral arteries patent bilaterally. There is artifact in the left M1 segment. No large vessel occlusion or flow limiting stenosis. Negative for cerebral aneurysm. IMPRESSION: Acute infarct left PCA territory.  No hemorrhage Extensive chronic ischemic changes. Severe stenosis left posterior cerebral artery. Moderate stenosis right posterior cerebral artery. Electronically Signed   By: Franchot Gallo M.D.   On: 11/17/2015 07:28    Jose Corvin, DO  [facility] Pager [phone]  If 7PM-7AM, please contact night-coverage [email or web address] Password [facility] 11/21/2015, 11:38 AM   LOS: 4 days

## 2015-11-21 NOTE — Progress Notes (Signed)
Physical Therapy Treatment Patient Details Name: Gregory Perry MRN: [id number] DOB: [birth year] Today's Date: 11/21/2015    History of Present Illness 65 y.o. male admitted for near syncope, fall and HTN. MRI (+) acute/subacute large L PCA territory infarct and remote parietal infarct. PMH significant for HTN, dysphagia.    PT Comments    Progressing well, R lower quadrant to middle R field improving with pt able to see mid range periphery.  Upper quadrant still shows vision deficits an inch or 2 from midline.  Generally stability improving well.   Follow Up Recommendations  Outpatient PT     Equipment Recommendations  None recommended by PT    Recommendations for Other Services       Precautions / Restrictions Precautions Precautions: Fall Precaution Comments: R homonymous heminanopsia    Mobility  Bed Mobility Overal bed mobility: Modified Independent                Transfers Overall transfer level: Modified independent     Sit to Stand: Modified independent (Device/Increase time)            Ambulation/Gait Ambulation/Gait assistance: Supervision;Modified independent (Device/Increase time) Ambulation Distance (Feet): 600 Feet Assistive device: None Gait Pattern/deviations: Step-through pattern Gait velocity: age appropriate Gait velocity interpretation: >2.62 ft/sec, indicative of independent community ambulator General Gait Details: challenged more with emphasis on scanning R ending with throwing ball for him to catch in R field which he improved on significantly.   Stairs Stairs: Yes Stairs assistance: Modified independent (Device/Increase time) Stair Management: No rails;Alternating pattern;Forwards Number of Stairs: 10 General stair comments: safe and steady  Wheelchair Mobility    Modified Rankin (Stroke Patients Only) Modified Rankin (Stroke Patients Only) Pre-Morbid Rankin Score: No symptoms Modified Rankin: Moderate disability     Balance Overall balance assessment: Needs assistance Sitting-balance support: No upper extremity supported Sitting balance-Leahy Scale: Normal     Standing balance support: No upper extremity supported Standing balance-Leahy Scale: Good                      Cognition Arousal/Alertness: Awake/alert Behavior During Therapy: WFL for tasks assessed/performed Overall Cognitive Status: Within Functional Limits for tasks assessed                      Exercises      General Comments        Pertinent Vitals/Pain Pain Assessment: No/denies pain    Home Living                      Prior Function            PT Goals (current goals can now be found in the care plan section) Acute Rehab PT Goals Patient Stated Goal: to go home Time For Goal Achievement: 12/01/15 Potential to Achieve Goals: Good Progress towards PT goals: Progressing toward goals    Frequency  Min 3X/week    PT Plan Current plan remains appropriate    Co-evaluation             End of Session   Activity Tolerance: Patient tolerated treatment well Patient left: in bed;with family/visitor present     Time: 1213-1230 PT Time Calculation (min) (ACUTE ONLY): 17 min  Charges:  $Gait Training: 8-22 mins                    G Codes:      Leny Morozov, Tessie Fass 11/21/2015, 12:39 PM  11/21/2015  Donnella Sham, [place] [phone]  (pager)

## 2015-11-22 ENCOUNTER — Encounter (HOSPITAL_COMMUNITY): Payer: Self-pay

## 2015-11-22 ENCOUNTER — Encounter (HOSPITAL_COMMUNITY)
Admission: EM | Disposition: A | Discharge status: Home / Self Care | Payer: Self-pay | Source: Home / Self Care | Attending: Internal Medicine

## 2015-11-22 ENCOUNTER — Inpatient Hospital Stay (HOSPITAL_COMMUNITY): Payer: MEDICAID | Admitting: Certified Registered Nurse Anesthetist

## 2015-11-22 ENCOUNTER — Other Ambulatory Visit: Payer: Self-pay

## 2015-11-22 DIAGNOSIS — B3781 Candidal esophagitis: Secondary | ICD-10-CM

## 2015-11-22 DIAGNOSIS — R1310 Dysphagia, unspecified: Secondary | ICD-10-CM

## 2015-11-22 HISTORY — PX: ESOPHAGOGASTRODUODENOSCOPY: SHX5428

## 2015-11-22 HISTORY — PX: BALLOON DILATION: SHX5330

## 2015-11-22 LAB — BASIC METABOLIC PANEL
ANION GAP: 12 (ref 5–15)
BUN: 15 mg/dL (ref 6–20)
CO2: 24 mmol/L (ref 22–32)
Calcium: 9.1 mg/dL (ref 8.9–10.3)
Chloride: 101 mmol/L (ref 101–111)
Creatinine, Ser: 0.84 mg/dL (ref 0.61–1.24)
GLUCOSE: 91 mg/dL (ref 65–99)
POTASSIUM: 3.1 mmol/L — AB (ref 3.5–5.1)
Sodium: 137 mmol/L (ref 135–145)

## 2015-11-22 LAB — MAGNESIUM: Magnesium: 1.8 mg/dL (ref 1.7–2.4)

## 2015-11-22 SURGERY — EGD (ESOPHAGOGASTRODUODENOSCOPY)
Anesthesia: Monitor Anesthesia Care

## 2015-11-22 MED ORDER — FLUCONAZOLE 40 MG/ML PO SUSR
400.0000 mg | Freq: Once | ORAL | Status: DC
  Filled 2015-11-22 (×2): qty 10

## 2015-11-22 MED ORDER — LACTATED RINGERS IV SOLN
INTRAVENOUS | Status: DC
  Administered 2015-11-22: 1000 mL via INTRAVENOUS

## 2015-11-22 MED ORDER — LACTATED RINGERS IV SOLN
INTRAVENOUS | Status: DC | PRN
  Administered 2015-11-22: 12:00:00 via INTRAVENOUS

## 2015-11-22 MED ORDER — POTASSIUM CHLORIDE 20 MEQ/15ML (10%) PO SOLN
40.0000 meq | Freq: Once | ORAL | Status: AC
  Administered 2015-11-22: 40 meq via ORAL
  Filled 2015-11-22: qty 30

## 2015-11-22 MED ORDER — FENTANYL CITRATE (PF) 250 MCG/5ML IJ SOLN
INTRAMUSCULAR | Status: DC | PRN
  Administered 2015-11-22: 50 ug via INTRAVENOUS

## 2015-11-22 MED ORDER — ASPIRIN 81 MG PO CHEW
81.0000 mg | CHEWABLE_TABLET | Freq: Every day | ORAL | Status: DC
  Administered 2015-11-22: 81 mg via ORAL
  Filled 2015-11-22 (×2): qty 1

## 2015-11-22 MED ORDER — PROPOFOL 10 MG/ML IV BOLUS
INTRAVENOUS | Status: DC | PRN
  Administered 2015-11-22 (×4): 20 mg via INTRAVENOUS

## 2015-11-22 MED ORDER — DEXMEDETOMIDINE HCL IN NACL 400 MCG/100ML IV SOLN
INTRAVENOUS | Status: DC | PRN
  Administered 2015-11-22: 0.7 ug/kg/h via INTRAVENOUS

## 2015-11-22 MED ORDER — PANTOPRAZOLE SODIUM 40 MG PO PACK
40.0000 mg | PACK | Freq: Two times a day (BID) | ORAL | Status: DC
  Administered 2015-11-22 – 2015-11-23 (×3): 40 mg via ORAL
  Filled 2015-11-22 (×3): qty 20

## 2015-11-22 MED ORDER — BUTAMBEN-TETRACAINE-BENZOCAINE 2-2-14 % EX AERO
INHALATION_SPRAY | CUTANEOUS | Status: DC | PRN
  Administered 2015-11-22: 2 via TOPICAL

## 2015-11-22 MED ORDER — PHENYLEPHRINE HCL 10 MG/ML IJ SOLN
10.0000 mg | INTRAMUSCULAR | Status: DC | PRN
  Administered 2015-11-22: 20 ug/min via INTRAVENOUS

## 2015-11-22 MED ORDER — FLUCONAZOLE 40 MG/ML PO SUSR
200.0000 mg | Freq: Every day | ORAL | Status: DC
  Administered 2015-11-23: 200 mg via ORAL
  Filled 2015-11-22: qty 5

## 2015-11-22 MED ORDER — ASPIRIN EC 325 MG PO TBEC
325.0000 mg | DELAYED_RELEASE_TABLET | Freq: Every day | ORAL | Status: DC
  Administered 2015-11-23: 325 mg via ORAL
  Filled 2015-11-22: qty 1

## 2015-11-22 NOTE — Anesthesia Preprocedure Evaluation (Signed)
Anesthesia Evaluation  Patient identified by MRN, date of birth, ID band Patient awake    Reviewed: Allergy & Precautions, H&P , NPO status , Patient's Chart, lab work & pertinent test results  Airway Mallampati: II   Neck ROM: full    Dental   Pulmonary Current Smoker,    breath sounds clear to auscultation       Cardiovascular hypertension, + Peripheral Vascular Disease   Rhythm:regular Rate:Normal     Neuro/Psych  Headaches, CVA    GI/Hepatic GERD  ,  Endo/Other    Renal/GU      Musculoskeletal   Abdominal   Peds  Hematology   Anesthesia Other Findings   Reproductive/Obstetrics                             Anesthesia Physical Anesthesia Plan  ASA: II  Anesthesia Plan: MAC   Post-op Pain Management:    Induction: Intravenous  Airway Management Planned: Nasal Cannula  Additional Equipment:   Intra-op Plan:   Post-operative Plan:   Informed Consent: I have reviewed the patients History and Physical, chart, labs and discussed the procedure including the risks, benefits and alternatives for the proposed anesthesia with the patient or authorized representative who has indicated his/her understanding and acceptance.     Plan Discussed with: CRNA, Surgeon and Anesthesiologist  Anesthesia Plan Comments:         Anesthesia Quick Evaluation

## 2015-11-22 NOTE — Clinical Documentation Improvement (Signed)
Hospitalist  Can the diagnosis of CHF be further specified?    Acuity - Acute, Chronic, Acute on Chronic   Type - Systolic, Diastolic, Systolic and Diastolic  Other  Clinically Undetermined   Document any associated diagnoses/conditions   Supporting Information: Cardiology notes - CHF: Low EF on echo likely from ETOH. After he recovers from acute CVA can have lexiscan myovue Start ACE and beta blocker.  No BNP done.    Please exercise your independent, professional judgment when responding. A specific answer is not anticipated or expected.   Thank You,  [place] [phone]

## 2015-11-22 NOTE — Progress Notes (Signed)
Patient ID: Gregory Perry, male   DOB: 05-Sep-1950, 65 y.o.   MRN: [id number]    Subjective:  Denies SSCP, palpitations or Dyspnea Taking soft solids   Objective:  Filed Vitals:   11/21/15 1738 11/21/15 2219 11/22/15 0136 11/22/15 0605  BP: 124/81 123/84 137/86 122/73  Pulse: 96 75 68 65  Temp: 98.7 F (37.1 C) 98.9 F (37.2 C) 99 F (37.2 C) 98.7 F (37.1 C)  TempSrc: Oral Oral Oral Oral  Resp: 20 20 20 18   Height:      Weight:      SpO2: 100% 99% 100% 99%    Intake/Output from previous day:  Intake/Output Summary (Last 24 hours) at 11/22/15 [id number] Last data filed at 11/21/15 2305  Gross per 24 hour  Intake    320 ml  Output      0 ml  Net    320 ml    Physical Exam: Affect appropriate Thin black male  HEENT:small field cut mild facial asymetry Neck supple with no adenopathy JVP normal no bruits no thyromegaly Lungs clear with no wheezing and good diaphragmatic motion Heart:  S1/S2 no murmur, no rub, gallop or click PMI normal Abdomen: benighn, BS positve, no tenderness, no AAA no bruit.  No HSM or HJR Distal pulses intact with no bruits No edema Neuro non-focal Skin warm and dry No muscular weakness   Lab Results: Basic Metabolic Panel:  Recent Labs  11/21/15 0356 11/22/15 0550  NA 137 137  K 3.2* 3.1*  CL 102 101  CO2 25 24  GLUCOSE 79 91  BUN 9 15  CREATININE 0.87 0.84  CALCIUM 9.3 9.1  MG 1.6* 1.8    Imaging: Dg Esophagus  11/20/2015  CLINICAL DATA:  Dysphagia. Soft food and medicine lodging in upper esophagus. History of upper esophageal stricture. EXAM: ESOPHOGRAM/BARIUM SWALLOW TECHNIQUE: Single contrast examination was performed using  thick barium. FLUOROSCOPY TIME:  Radiation Exposure Index (as provided by the fluoroscopic device): If the device does not provide the exposure index: Fluoroscopy Time:  0 minutes and 26 seconds. Number of Acquired Images: COMPARISON:  03/11/2011. FINDINGS: Patient was given an initial swallow of thick  barium. This immediately demonstrates a marked gated narrowing of the proximal esophagus (approximately the level of the transverse aorta) beyond which only a trace amount of barium passes. This creates a "String sign" over a several cm segment of the proximal thoracic esophagus. The patient immediately had symptoms of fullness and sticking sensation in the esophagus and vomited the barium almost immediately. Given that only minimal barium was able to pass beyond the stricture, the exam was stopped. IMPRESSION: Marked stricture/ narrowing of the proximal thoracic esophagus, at approximately level of the transverse aorta, beyond which only minimal barium passes. This is near the location of the stricture seen on previous imaging. Upper endoscopy recommended to further elucidate etiology. Electronically Signed   By: Misty Stanley M.D.   On: 11/20/2015 09:41    Cardiac Studies:  ECG: ST rate 199 LVH    Telemetry:  NSR 11/22/2015   Echo: Study Conclusions  - Left ventricle: The cavity size was mildly dilated. There was  mild concentric hypertrophy. Systolic function was moderately  reduced. The estimated ejection fraction was in the range of 35%  to 40%. Diffuse hypokinesis. Doppler parameters are consistent  with abnormal left ventricular relaxation (grade 1 diastolic  dysfunction). There was no evidence of elevated ventricular  filling pressure by Doppler parameters. - Aortic valve: There was trivial regurgitation. -  Aortic root: The aortic root was normal in size. - Mitral valve: Structurally normal valve. - Right ventricle: Systolic function was normal. - Right atrium: The atrium was normal in size. - Atrial septum: No defect or patent foramen ovale was identified. - Pulmonary arteries: Systolic pressure was within the normal  range. - Inferior vena cava: The vessel was normal in size. - Pericardium, extracardiac: There was no pericardial effusion.  Impressions:  - Negative  bubble syudy. No evidence for ASD or PFO.  Medications:   . aspirin  300 mg Rectal Daily  . atorvastatin  40 mg Oral q1800  . feeding supplement  1 Container Oral TID BM  . feeding supplement (ENSURE ENLIVE)  237 mL Oral BID BM  . heparin subcutaneous  5,000 Units Subcutaneous Q8H  . lisinopril  5 mg Oral Daily  . metoprolol tartrate  25 mg Oral BID       Assessment/Plan:  CVA:  No SOE on TTE with negative bubble Post ILR implantation Dr Lovena Le. No TEE due to dysphagia and esophageal pathology ASA per neuro CHF:  Low EF on echo likely from ETOH.  After he recovers from acute CVA can have lexiscan myovue Start ACE and beta blocker As outpatient GI:  Cleared from our perspective to have EGD in am low risk of hypotension making watershed stroke issues worse. Need to r/o Esophageal cancer and improve ability to eat given protein calorie malnutrition and 30 lb weight loss He has also discussed with Dr Leonie Man ? Not being scheduled until Friday  Still has ASA on med list   Jenkins Rouge 11/22/2015, 8:26 AM

## 2015-11-22 NOTE — Progress Notes (Signed)
Will plan for EGD with esophageal dilation tomorrow AM with MAC. No availability to get anaesthesia time today. NPO after midnight Appreciate the risk assessment provided by Dr Johnsie Cancel and Dr Leonie Man. Will consider switching to ASA 81 mg instead of 325mg  to decrease the risk of bleeding.  The risks and benefits as well as alternatives of endoscopic procedure(s) have been discussed and reviewed. All questions answered. The patient agrees to proceed.  Damaris Hippo , MD [phone] Mon-Fri 8a-5p [phone] after 5p, weekends, holidays

## 2015-11-22 NOTE — Op Note (Signed)
[facility] Patient Name: Gregory Perry Procedure Date : 11/22/2015 MRN: [id number] Attending MD: Mauri Pole , MD Date of Birth: Aug 10, 1950 CSN: [id number] Age: 65 Admit Type: Inpatient Procedure:                Upper GI endoscopy Indications:              Dysphagia Providers:                Mauri Pole, MD, Kingsley Plan, RN,                            Otilio Saber, Technician, Rhae Lerner, CRNA Referring MD:              Medicines:                Monitored Anesthesia Care Complications:            No immediate complications. Estimated Blood Loss:     Estimated blood loss was minimal. Procedure:                Pre-Anesthesia Assessment:                           - Prior to the procedure, a History and Physical                            was performed, and patient medications and                            allergies were reviewed. The patient's tolerance of                            previous anesthesia was also reviewed. The risks                            and benefits of the procedure and the sedation                            options and risks were discussed with the patient.                            All questions were answered, and informed consent                            was obtained. Prior Anticoagulants: The patient                            last took aspirin on the day of the procedure. ASA                            Grade Assessment: IV - A patient with severe                            systemic disease that is a constant threat to life.  After reviewing the risks and benefits, the patient                            was deemed in satisfactory condition to undergo the                            procedure.                           After obtaining informed consent, the endoscope was                            passed under direct vision. Throughout the                            procedure, the patient's  blood pressure, pulse, and                            oxygen saturations were monitored continuously. The                            was introduced through the mouth, and advanced to                            the second part of duodenum. The [id number])                            scope was introduced through the and advanced to                            the. The upper GI endoscopy was accomplished                            without difficulty. The patient tolerated the                            procedure well. Scope In: Scope Out: Findings:      A few severe (stenosis; an endoscope cannot pass) benign-appearing,       intrinsic stenoses were found 25 to 30 cm from the incisors. The       narrowest stenosis measured 5 mm (inner diameter) x 5 cm (in length) and       a TTS dilator was passed through the scope. Dilation with a 6-7-8 mm       balloon dilator was performed to 8 mm. The strictures were traversed       after downsizing scope post dilation. The dilation site was examined       following endoscope reinsertion and showed moderate improvement in       luminal narrowing.      Diffuse candidiasis was found in the entire esophagus. Severe LA grade D       esophagitis at EG junction at 44cm. Biopsies were taken with a cold       forceps for histology throught out the esophagus.      Few non-bleeding cratered gastric ulcers with pigmented material were  found in the stomach. The largest lesion was 4 mm in largest dimension.      Few non-bleeding cratered duodenal ulcers with no stigmata of bleeding       were found in the duodenal bulb. The largest lesion was 3 mm in largest       dimension. Impression:               - Benign-appearing esophageal stenoses. Dilated.                            Biopsied.                           - Monilial esophagitis. Biopsied.                           - Non-bleeding gastric ulcers with pigmented                            material.                            - Multiple non-bleeding duodenal ulcers with no                            stigmata of bleeding. Moderate Sedation:      N/A Recommendation:           - Mechanical soft diet.                           - Continue present medications.                           - Resume Aspirin 81 mg and can switch to Aspirin                            325mg  daily next week if indicated by primary team                           - No ibuprofen, naproxen, or other non-steroidal                            anti-inflammatory drugs.                           - Fluconazole 400mg  x1 today followed by 200mg                             daily for next 14 days                           - Protonix suspension 40mg  BID x 2 months                           - Await pathology results.                           -  Repeat upper endoscopy in 3 months for                            retreatment.                           - Return to GI clinic PRN.                           -Please call with any questions Procedure Code(s):        --- Professional ---                           [phone], Esophagogastroduodenoscopy, flexible,                            transoral; with transendoscopic balloon dilation of                            esophagus (less than 30 mm diameter)                           43239, Esophagogastroduodenoscopy, flexible,                            transoral; with biopsy, single or multiple Diagnosis Code(s):        --- Professional ---                           K22.2, Esophageal obstruction                           B37.81, Candidal esophagitis                           K25.9, Gastric ulcer, unspecified as acute or                            chronic, without hemorrhage or perforation                           K26.9, Duodenal ulcer, unspecified as acute or                            chronic, without hemorrhage or perforation                           R13.10, Dysphagia, unspecified CPT copyright  2016 [employer]. All rights reserved. The codes documented in this report are preliminary and upon coder review may  be revised to meet current compliance requirements. Mauri Pole, MD 11/22/2015 12:34:05 PM This report has been signed electronically. Number of Addenda: 0

## 2015-11-22 NOTE — Anesthesia Procedure Notes (Signed)
Procedure Name: MAC Date/Time: 11/22/2015 11:40 AM Performed by: Tressia Miners LEFFEW Pre-anesthesia Checklist: Patient identified, Emergency Drugs available, Suction available, Timeout performed and Patient being monitored Patient Re-evaluated:Patient Re-evaluated prior to inductionOxygen Delivery Method: Nasal cannula Placement Confirmation: positive ETCO2

## 2015-11-22 NOTE — Transfer of Care (Signed)
Immediate Anesthesia Transfer of Care Note  Patient: Gregory Perry  Procedure(s) Performed: Procedure(s): ESOPHAGOGASTRODUODENOSCOPY (EGD) (N/A) BALLOON DILATION (N/A)  Patient Location: Endoscopy Unit  Anesthesia Type:MAC  Level of Consciousness: awake, alert , oriented, patient cooperative and responds to stimulation  Airway & Oxygen Therapy: Patient Spontanous Breathing and Patient connected to nasal cannula oxygen  Post-op Assessment: Report given to RN, Post -op Vital signs reviewed and stable and Patient moving all extremities X 4  Post vital signs: Reviewed and stable  Last Vitals:  Filed Vitals:   11/22/15 1017 11/22/15 1230  BP: 149/94 136/86  Pulse: 64 73  Temp: 37 C   Resp: 17 12    Last Pain:  Filed Vitals:   11/22/15 1231  PainSc: 0-No pain      Patients Stated Pain Goal: 0 [phone]  Complications: No apparent anesthesia complications

## 2015-11-22 NOTE — Progress Notes (Signed)
PROGRESS NOTE  Gregory Perry [id number] DOB: 1951-01-31 DOA: 11/16/2015 PCBrief History:  65 year old male with a history of hypertension, tobacco abuse, alcohol abuse presents with one-day history of gait instability and fall. Upon further questioning, the patient did not have loss of consciousness. However, the patient stated that he "did not feel right"the day prior to admission. He denied any dysarthria, focal collection may weakness, loss of vision. Initial CT of the brain revealed evolving acute or subacute infarct in the left occipital lobe. MRI revealed a left PCA infarct. As result, neurology was consulted and full stroke workup was undertaken. In addition, the patient has been complaining of dysphagia and odynophagia for the last 2-3 years. He has had intermittent vomiting because he is not able to swallow food. In addition he has lost 30 pounds in the past 6-12 months. On 04/29/2011, the patient had an EGD performed by Dr. Oletta Lamas which revealed a stricture in the proximal esophagus with a bloody ulcer. There was difficulty advancing the scope at that time which required ENT to assist. Initially, the patient did not want to undergo the risk of EGD and esophageal dilatation given his recent stroke. However, after speaking with his brother, the patient change his mind. Old records reviewed and summarized. GI requested cardiology clearance as well as neurology to follow.  Assessment/Plan: Left PCA stroke -Appreciate Neurology Consult -PT/OT evaluation--> HHPT -Speech therapy eval--Dys 3 diet when able to tolerate -CT brain--evolving acute or subacute left occipital lobe infarct -MRI-acute left PCA infarct -MRA brain---severe stenosis left posterior cerebral artery, artery stenosis right posterior cerebral artery -CTA neck--no significant carotid or vertebral stenosis -Echo---EF 35-40%; diffuse HK, grade 1 DD, negative bubble study. Outpatient follow-up with cardiology for  Myoview to rule out ischemia. -LDL--136 -HbA1C--4.9 -11/17/15--loop recorder - s/p esophageal stricture dilatation 6/28-discuss with GI regarding full dose aspirin rather than aspirin 81 MG. -right homonymous hemianopia--cannot drive until he has formal driving eval [facility]  Dysphagia/Esophageal stricture -appreciate GI help -having difficulty tolerating full liquids-->vomit -downgrade to clear liquids -6/26 Esophagram--marked stricture in the proximal thoracic esophagus at the level of the transverse aorta -may need ENT help -continue IVF -The patient has changes mind and wants endoscopy and dilatation -GI has requested cardiac clearance and neurology to follow--cardiology has cleared the patient. - Status post EGD and esophageal stricture dilatation by [facility] on 6/28.  Hypertension -Allow for permissive hypertension, then gradually normalize over the next 5-7 days -hydralazine for SBP >220 or DBP >120 - d/c amlodipine  -start metoprolol and lisinopril. Reasonable inpatient control.  Alcohol and tobacco abuse -CIWA--no signs or symptoms of withdrawal -NicoDerm patch -Cessation discussed -Last drink 4 days prior to admission -Patient drinks 2 beers with shot of vodka daily  Cardiomyopathy -start metoprolol and lisinopril -d/c amlodipine  -will need myoview in outpt setting-follow-up with cardiology. -Echo---EF 35-40%; diffuse HK, grade 1 DD, negative bubble study  Tachycardia -11/17/15-Patient had 3-4 episodes of tachycardia up to 140 fasting one to 2 minutes -Patient was asymptomatic -telemetry review suggested possible atrial tachycardia -Plan to start low-dose beta blocker if continues -loop recorder placed  Severe protein calorie malnutrition -Continue ensure  Hypokalemia/Hypomagnesemia -Replace and follow.   Disposition Plan: Home possibly 6/29 Family Communication: Discussed with sister and brother at bedside on 6/28  Consultants: Neurology,  cardiology, [facility]  Code Status: FULL  DVT Prophylaxis: [place] Heparin   Procedures: As Listed in Progress Note Above  Antibiotics: None  Subjective: Seen this afternoon post EGD. Patient was in endoscopy this morning. Denies complaints. Able to swallow better. Family at bedside.   Objective: Filed Vitals:   11/21/15 1738 11/21/15 2219 11/22/15 0136 11/22/15 0605  BP: 124/81 123/84 137/86 122/73  Pulse: 96 75 68 65  Temp: 98.7 F (37.1 C) 98.9 F (37.2 C) 99 F (37.2 C) 98.7 F (37.1 C)  TempSrc: Oral Oral Oral Oral  Resp: 20 20 20 18   Height:      Weight:      SpO2: 100% 99% 100% 99%    Intake/Output Summary (Last 24 hours) at 11/22/15 [id number] Last data filed at 11/21/15 2305  Gross per 24 hour  Intake    320 ml  Output      0 ml  Net    320 ml   Weight change:  Exam:   General:  Pt is alert, follows commands appropriately, not in acute distress. Sitting up comfortably in chair.  HEENT: No icterus, No thrush, No neck mass, [place]/AT  Cardiovascular: RRR, S1/S2, no rubs, no gallops  Respiratory: CTA bilaterally, no wheezing, no crackles, no rhonchi  Abdomen: Soft/+BS, non tender, non distended, no guarding  Extremities: No edema, No lymphangitis, No petechiae, No rashes, no synovitis   Data Reviewed: I have personally reviewed following labs and imaging studies Basic Metabolic Panel:  Recent Labs Lab 11/17/15 0426 11/17/15 1656 11/18/15 0545 11/19/15 0612 11/21/15 0356 11/22/15 0550  NA 139  --  136 140 137 137  K 3.1*  --  3.6 3.4* 3.2* 3.1*  CL 104  --  102 107 102 101  CO2 24  --  23 23 25 24   GLUCOSE 114*  --  83 77 79 91  BUN 18  --  16 18 9 15   CREATININE 0.89 1.12 1.05 0.88 0.87 0.84  CALCIUM 9.3  --  9.9 9.2 9.3 9.1  MG  --   --  1.8  --  1.6* 1.8   Liver Function Tests:  Recent Labs Lab 11/17/15 0426  AST 36  ALT 18  ALKPHOS 69  BILITOT 1.3*  PROT 8.3*  ALBUMIN 4.2   No results for input(s): LIPASE, AMYLASE in the  last 168 hours. No results for input(s): AMMONIA in the last 168 hours. Coagulation Profile: No results for input(s): INR, PROTIME in the last 168 hours. CBC:  Recent Labs Lab 11/16/15 2101 11/17/15 0426 11/17/15 1656  WBC 9.5 8.3 8.9  NEUTROABS  --  5.7  --   HGB 15.3 14.0 13.5  HCT 43.9 40.6 40.0  MCV 88.5 88.1 88.3  PLT 267 278 260   Cardiac Enzymes: No results for input(s): CKTOTAL, CKMB, CKMBINDEX, TROPONINI in the last 168 hours. BNP: Invalid input(s): POCBNP CBG:  Recent Labs Lab 11/16/15 2346  GLUCAP 96   HbA1C: No results for input(s): HGBA1C in the last 72 hours. Urine analysis:    Component Value Date/Time   COLORURINE AMBER* 11/16/2015 2330   APPEARANCEUR CLOUDY* 11/16/2015 2330   LABSPEC 1.032* 11/16/2015 2330   PHURINE 5.5 11/16/2015 2330   GLUCOSEU NEGATIVE 11/16/2015 2330   HGBUR MODERATE* 11/16/2015 2330   BILIRUBINUR MODERATE* 11/16/2015 2330   KETONESUR 40* 11/16/2015 2330   PROTEINUR 100* 11/16/2015 2330   NITRITE NEGATIVE 11/16/2015 2330   LEUKOCYTESUR NEGATIVE 11/16/2015 2330   Sepsis Labs: @LABRCNTIP (procalcitonin:4,lacticidven:4) )No results found for this or any previous visit (from the past 240 hour(s)).   Scheduled Meds: . aspirin  300 mg Rectal Daily  .  atorvastatin  40 mg Oral q1800  . feeding supplement  1 Container Oral TID BM  . feeding supplement (ENSURE ENLIVE)  237 mL Oral BID BM  . heparin subcutaneous  5,000 Units Subcutaneous Q8H  . lisinopril  5 mg Oral Daily  . metoprolol tartrate  25 mg Oral BID   Continuous Infusions:   Procedures/Studies: Dg Chest 2 View  11/17/2015  CLINICAL DATA:  65 year old male with hypertension EXAM: CHEST  2 VIEW COMPARISON:  Chest radiograph dated 05/07/2011 FINDINGS: The heart size and mediastinal contours are within normal limits. Both lungs are clear. The visualized skeletal structures are unremarkable. IMPRESSION: No active cardiopulmonary disease. Electronically Signed   By: Anner Crete M.D.   On: 11/17/2015 00:45   Ct Head Wo Contrast  11/17/2015  CLINICAL DATA:  Acute onset of lightheadedness. Unsteady gait. High blood pressure. Initial encounter. EXAM: CT HEAD WITHOUT CONTRAST TECHNIQUE: Contiguous axial images were obtained from the base of the skull through the vertex without intravenous contrast. COMPARISON:  CT of the neck performed 05/01/2011 FINDINGS: There is an evolving large acute or subacute infarct involving the left occipital lobe, extending into the posterior aspect of the left temporal lobe. There is mild mass effect, without significant midline shift. No definite hemorrhagic transformation is noted, though there is slight irregularity about the left transverse sinus as it passes posterior to the infarct. Mild cerebellar atrophy is noted. Scattered periventricular and subcortical white matter change likely reflects small vessel ischemic microangiopathy. A chronic infarct is noted at the high right parietal lobe, with associated encephalomalacia. Prominence of the ventricles and sulci reflects mild cortical volume loss. The brainstem and fourth ventricle are within normal limits. There is no evidence of fracture; visualized osseous structures are unremarkable in appearance. The orbits are within normal limits. The paranasal sinuses and mastoid air cells are well-aerated. No significant soft tissue abnormalities are seen. IMPRESSION: 1. Evolving large acute or subacute infarct involving the left occipital lobe, extending into the posterior aspect of the left temporal lobe. Mild mass effect, without significant midline shift. No definite hemorrhagic transformation is seen, though there is slight irregularity of the left transverse sinus as it passes posterior to the infarct. 2. Chronic infarct at the high right parietal lobe, with associated encephalomalacia. 3. Mild cortical volume loss and scattered small vessel ischemic microangiopathy. These results were called by  telephone at the time of interpretation on 11/17/2015 at 2:05 am to Dr. Veatrice Kells, who verbally acknowledged these results. Electronically Signed   By: Garald Balding M.D.   On: 11/17/2015 02:07   Ct Angio Neck W Or Wo Contrast  11/17/2015  CLINICAL DATA:  Stroke EXAM: CT ANGIOGRAPHY NECK TECHNIQUE: Multidetector CT imaging of the neck was performed using the standard protocol during bolus administration of intravenous contrast. Multiplanar CT image reconstructions and MIPs were obtained to evaluate the vascular anatomy. Carotid stenosis measurements (when applicable) are obtained utilizing NASCET criteria, using the distal internal carotid diameter as the denominator. CONTRAST:  50 mL Isovue 370 IV COMPARISON:  .MRI head 11/17/2015 FINDINGS: Aortic arch: Normal aortic arch.  Proximal great vessels are normal. Right carotid system: Right common carotid artery widely patent. Minimal atherosclerotic calcification of the carotid bifurcation without significant stenosis or dissection Left carotid system: Left common carotid artery widely patent. Mild atherosclerotic calcification of the carotid bifurcation without stenosis. No dissection. Vertebral arteries:Left vertebral dominant. Both vertebral arteries patent without stenosis or dissection. Both vertebral arteries contribute to the basilar. Skeleton: Moderate cervical spine  degenerative changes with spondylosis diffusely. No fracture or bone lesion. Other neck: .  Apical blebs.  No apical mass or infiltrate. IMPRESSION: No significant carotid or vertebral artery stenosis in the neck. Minimal atherosclerotic disease at the carotid bifurcation bilaterally. Electronically Signed   By: Franchot Gallo M.D.   On: 11/17/2015 10:57   Mr Brain Wo Contrast  11/17/2015  CLINICAL DATA:  Stroke EXAM: MRI HEAD WITHOUT CONTRAST MRA HEAD WITHOUT CONTRAST TECHNIQUE: Multiplanar, multiecho pulse sequences of the brain and surrounding structures were obtained without  intravenous contrast. Angiographic images of the head were obtained using MRA technique without contrast. COMPARISON:  CT head 11/17/2015 FINDINGS: MRI HEAD FINDINGS Acute left posterior cerebral artery infarct. Restricted diffusion in the left posterior temporal and occipital lobe. Small area of acute infarct in the left thalamus. No associated hemorrhage. No other areas of acute infarct Chronic infarct right parietal cortex. Chronic microvascular ischemic changes throughout the white matter. Chronic infarct left caudate and right thalamus. Slight chronic ischemia left pons. Negative for intracranial hemorrhage. Negative for mass or edema. No shift of the midline structures. Paranasal sinuses clear.  Normal orbit.  Normal pituitary. MRA HEAD FINDINGS Left vertebral artery dominant. Both vertebral arteries contribute to the basilar. Prominent AICA with moderate stenosis of the origin of the left AICA. Basilar widely patent. Superior cerebellar arteries patent bilaterally. Moderate stenosis of the right posterior cerebral artery. Severe stenosis left posterior cerebral artery with better flow distally. This is the area of acute infarct. Cavernous carotid shows mild irregularity. Moderate focal stenosis left cavernous carotid. Right cavernous carotid patent. Anterior and middle cerebral arteries patent bilaterally. There is artifact in the left M1 segment. No large vessel occlusion or flow limiting stenosis. Negative for cerebral aneurysm. IMPRESSION: Acute infarct left PCA territory.  No hemorrhage Extensive chronic ischemic changes. Severe stenosis left posterior cerebral artery. Moderate stenosis right posterior cerebral artery. Electronically Signed   By: Franchot Gallo M.D.   On: 11/17/2015 07:28   Dg Esophagus  11/20/2015  CLINICAL DATA:  Dysphagia. Soft food and medicine lodging in upper esophagus. History of upper esophageal stricture. EXAM: ESOPHOGRAM/BARIUM SWALLOW TECHNIQUE: Single contrast examination  was performed using  thick barium. FLUOROSCOPY TIME:  Radiation Exposure Index (as provided by the fluoroscopic device): If the device does not provide the exposure index: Fluoroscopy Time:  0 minutes and 26 seconds. Number of Acquired Images: COMPARISON:  03/11/2011. FINDINGS: Patient was given an initial swallow of thick barium. This immediately demonstrates a marked gated narrowing of the proximal esophagus (approximately the level of the transverse aorta) beyond which only a trace amount of barium passes. This creates a "String sign" over a several cm segment of the proximal thoracic esophagus. The patient immediately had symptoms of fullness and sticking sensation in the esophagus and vomited the barium almost immediately. Given that only minimal barium was able to pass beyond the stricture, the exam was stopped. IMPRESSION: Marked stricture/ narrowing of the proximal thoracic esophagus, at approximately level of the transverse aorta, beyond which only minimal barium passes. This is near the location of the stricture seen on previous imaging. Upper endoscopy recommended to further elucidate etiology. Electronically Signed   By: Misty Stanley M.D.   On: 11/20/2015 09:41   Mr Jodene Nam Head/brain Wo Cm  11/17/2015  CLINICAL DATA:  Stroke EXAM: MRI HEAD WITHOUT CONTRAST MRA HEAD WITHOUT CONTRAST TECHNIQUE: Multiplanar, multiecho pulse sequences of the brain and surrounding structures were obtained without intravenous contrast. Angiographic images of the head were obtained  using MRA technique without contrast. COMPARISON:  CT head 11/17/2015 FINDINGS: MRI HEAD FINDINGS Acute left posterior cerebral artery infarct. Restricted diffusion in the left posterior temporal and occipital lobe. Small area of acute infarct in the left thalamus. No associated hemorrhage. No other areas of acute infarct Chronic infarct right parietal cortex. Chronic microvascular ischemic changes throughout the white matter. Chronic infarct left  caudate and right thalamus. Slight chronic ischemia left pons. Negative for intracranial hemorrhage. Negative for mass or edema. No shift of the midline structures. Paranasal sinuses clear.  Normal orbit.  Normal pituitary. MRA HEAD FINDINGS Left vertebral artery dominant. Both vertebral arteries contribute to the basilar. Prominent AICA with moderate stenosis of the origin of the left AICA. Basilar widely patent. Superior cerebellar arteries patent bilaterally. Moderate stenosis of the right posterior cerebral artery. Severe stenosis left posterior cerebral artery with better flow distally. This is the area of acute infarct. Cavernous carotid shows mild irregularity. Moderate focal stenosis left cavernous carotid. Right cavernous carotid patent. Anterior and middle cerebral arteries patent bilaterally. There is artifact in the left M1 segment. No large vessel occlusion or flow limiting stenosis. Negative for cerebral aneurysm. IMPRESSION: Acute infarct left PCA territory.  No hemorrhage Extensive chronic ischemic changes. Severe stenosis left posterior cerebral artery. Moderate stenosis right posterior cerebral artery. Electronically Signed   By: Franchot Gallo M.D.   On: 11/17/2015 07:28    Gaylin Bulthuis, MD, FACP, FHM. [facility] Pager [phone]  If 7PM-7AM, please contact night-coverage [email or web address] Password [email or web address] 11/22/2015, 6:12 PM   LOS: 5 days

## 2015-11-23 ENCOUNTER — Encounter (HOSPITAL_COMMUNITY): Payer: Self-pay | Admitting: Gastroenterology

## 2015-11-23 ENCOUNTER — Other Ambulatory Visit: Payer: Self-pay

## 2015-11-23 DIAGNOSIS — I63411 Cerebral infarction due to embolism of right middle cerebral artery: Secondary | ICD-10-CM

## 2015-11-23 LAB — BASIC METABOLIC PANEL
Anion gap: 8 (ref 5–15)
BUN: 9 mg/dL (ref 6–20)
CHLORIDE: 104 mmol/L (ref 101–111)
CO2: 25 mmol/L (ref 22–32)
CREATININE: 0.81 mg/dL (ref 0.61–1.24)
Calcium: 9.1 mg/dL (ref 8.9–10.3)
Glucose, Bld: 112 mg/dL — ABNORMAL HIGH (ref 65–99)
Potassium: 3.4 mmol/L — ABNORMAL LOW (ref 3.5–5.1)
SODIUM: 137 mmol/L (ref 135–145)

## 2015-11-23 MED ORDER — ATORVASTATIN CALCIUM 40 MG PO TABS: 40.0000 mg | ORAL_TABLET | Freq: Every day | ORAL | Status: DC

## 2015-11-23 MED ORDER — PANTOPRAZOLE SODIUM 40 MG PO TBEC: 40.0000 mg | DELAYED_RELEASE_TABLET | Freq: Two times a day (BID) | ORAL | Status: DC

## 2015-11-23 MED ORDER — METOPROLOL TARTRATE 25 MG PO TABS: 25.0000 mg | ORAL_TABLET | Freq: Two times a day (BID) | ORAL | Status: DC

## 2015-11-23 MED ORDER — ASPIRIN EC 325 MG PO TBEC: 325.0000 mg | DELAYED_RELEASE_TABLET | Freq: Every day | ORAL | Status: DC

## 2015-11-23 MED ORDER — LISINOPRIL 5 MG PO TABS: 5.0000 mg | ORAL_TABLET | Freq: Every day | ORAL | Status: DC

## 2015-11-23 MED ORDER — FLUCONAZOLE 40 MG/ML PO SUSR: 200.0000 mg | Freq: Every day | ORAL | Status: DC

## 2015-11-23 MED ORDER — POTASSIUM CHLORIDE CRYS ER 20 MEQ PO TBCR
40.0000 meq | EXTENDED_RELEASE_TABLET | Freq: Once | ORAL | Status: AC
  Administered 2015-11-23: 40 meq via ORAL
  Filled 2015-11-23: qty 2

## 2015-11-23 MED FILL — AMLODIPINE BESYLATE 5 MG TA: 5 | 30 days supply | Qty: 30 | Fill #0

## 2015-11-23 MED FILL — ?ATORVASTATIN 40MG TABLET: 40 | 30 days supply | Qty: 30 | Fill #0

## 2015-11-23 NOTE — Progress Notes (Signed)
Patient is discharged from room 5C13 at this time. Alert and in stable condition. IV site d/c'd and instructions read to patient with understanding verbalized. Left unit via wheelchair with family and all belongings at side.

## 2015-11-23 NOTE — Progress Notes (Signed)
Patient ID: Gregory Perry, male   DOB: 11-26-1950, 65 y.o.   MRN: [id number]    Subjective:  Eating better post stricture dilatation   Objective:  Filed Vitals:   11/22/15 2103 11/23/15 0123 11/23/15 0557 11/23/15 0906  BP: 155/86 118/70 125/71 119/73  Pulse: 72 64 61 73  Temp: 99.4 F (37.4 C) 98.1 F (36.7 C) 98.9 F (37.2 C) 98.8 F (37.1 C)  TempSrc: Oral Oral Oral Oral  Resp: 18 18 18 18   Height:      Weight:      SpO2: 100% 100% 100% 100%    Intake/Output from previous day:  Intake/Output Summary (Last 24 hours) at 11/23/15 1038 Last data filed at 11/22/15 2000  Gross per 24 hour  Intake    340 ml  Output      0 ml  Net    340 ml    Physical Exam: Affect appropriate Thin black male  HEENT:small field cut mild facial asymetry Neck supple with no adenopathy JVP normal no bruits no thyromegaly Lungs clear with no wheezing and good diaphragmatic motion Heart:  S1/S2 no murmur, no rub, gallop or click PMI normal Abdomen: benighn, BS positve, no tenderness, no AAA no bruit.  No HSM or HJR Distal pulses intact with no bruits No edema Neuro non-focal Skin warm and dry No muscular weakness   Lab Results: Basic Metabolic Panel:  Recent Labs  11/21/15 0356 11/22/15 0550 11/23/15 0756  NA 137 137 137  K 3.2* 3.1* 3.4*  CL 102 101 104  CO2 25 24 25   GLUCOSE 79 91 112*  BUN 9 15 9   CREATININE 0.87 0.84 0.81  CALCIUM 9.3 9.1 9.1  MG 1.6* 1.8  --     Imaging: No results found.  Cardiac Studies:  ECG: ST rate 199 LVH    Telemetry:  NSR 11/23/2015   Echo: Study Conclusions  - Left ventricle: The cavity size was mildly dilated. There was  mild concentric hypertrophy. Systolic function was moderately  reduced. The estimated ejection fraction was in the range of 35%  to 40%. Diffuse hypokinesis. Doppler parameters are consistent  with abnormal left ventricular relaxation (grade 1 diastolic  dysfunction). There was no evidence of elevated  ventricular  filling pressure by Doppler parameters. - Aortic valve: There was trivial regurgitation. - Aortic root: The aortic root was normal in size. - Mitral valve: Structurally normal valve. - Right ventricle: Systolic function was normal. - Right atrium: The atrium was normal in size. - Atrial septum: No defect or patent foramen ovale was identified. - Pulmonary arteries: Systolic pressure was within the normal  range. - Inferior vena cava: The vessel was normal in size. - Pericardium, extracardiac: There was no pericardial effusion.  Impressions:  - Negative bubble syudy. No evidence for ASD or PFO.  Medications:   . aspirin EC  325 mg Oral Daily  . atorvastatin  40 mg Oral q1800  . feeding supplement  1 Container Oral TID BM  . feeding supplement (ENSURE ENLIVE)  237 mL Oral BID BM  . fluconazole  200 mg Oral Daily  . fluconazole  400 mg Oral Once  . heparin subcutaneous  5,000 Units Subcutaneous Q8H  . lisinopril  5 mg Oral Daily  . metoprolol tartrate  25 mg Oral BID  . pantoprazole sodium  40 mg Oral BID       Assessment/Plan:  CVA:  No SOE on TTE with negative bubble Post ILR implantation Dr Lovena Le. No TEE  due to dysphagia and esophageal pathology ASA per neuro CHF:  Low EF on echo likely from ETOH.  After he recovers from acute CVA will arrange outpatient f/u with Dr Lovena Le for myovue  Continue beta blocker and ACE GI:  Stricture with ulcer and candidiasis.  On pantopraxole f/u EGD in 3 months biopsy pending.    Will sign off    Jenkins Rouge 11/23/2015, 10:38 AM

## 2015-11-23 NOTE — Progress Notes (Signed)
[facility] ROUNDING NOTE   Subjective: Eating eggs and biscuit this AM. Swallowing is much better. Denies any pain. Planning to go home today   Objective: Vital signs in last 24 hours: Temp:  [98.1 F (36.7 C)-99.4 F (37.4 C)] 98.8 F (37.1 C) (06/29 0906) Pulse Rate:  [57-75] 73 (06/29 0906) Resp:  [12-18] 18 (06/29 0906) BP: (116-155)/(70-95) 119/73 mmHg (06/29 0906) SpO2:  [100 %] 100 % (06/29 0906) Weight:  [142 lb (64.411 kg)] 142 lb (64.411 kg) (06/28 1017) Last BM Date: 11/20/15 General: NAD Lungs:b/l clear Heart: s1s2 rrr Abdomen: soft NTND Ext: No edema   Intake/Output from previous day: 06/28 0701 - 06/29 0700 In: 340 [P.O.:240; I.V.:100] Out: -  Intake/Output this shift:     Lab Results: No results for input(s): WBC, HGB, PLT, MCV in the last 72 hours. BMET  Recent Labs  11/21/15 0356 11/22/15 0550 11/23/15 0756  NA 137 137 137  K 3.2* 3.1* 3.4*  CL 102 101 104  CO2 25 24 25   GLUCOSE 79 91 112*  BUN 9 15 9   CREATININE 0.87 0.84 0.81  CALCIUM 9.3 9.1 9.1   LFT No results for input(s): PROT, ALBUMIN, AST, ALT, ALKPHOS, BILITOT, BILIDIR, IBILI in the last 72 hours. PT/INR No results for input(s): INR in the last 72 hours.    Imaging/Other results: No results found.    Assessment  19 yr M with h/o CHF, recent CVA with chronic dysphagia s/p EGD with esophageal dilation yesterday to 65mm. He also findings of esophageal candida iasis likely etiology for esophageal stricture and multiple gastric and duodenal ulcers.   Plan Continue Flucanozole 200mg  daily X 14 day course PPI twice daily x 2 months followed by daily He is currently on ASA 325mg  ok to continue Follow up in office visit and will plan for repeat EGD with dilation in 3 months Please call with any questions    K. Denzil Magnuson , MD [phone] Mon-Fri 8a-5p [phone] after 5p, weekends, holidays [facility]

## 2015-11-23 NOTE — Progress Notes (Signed)
Physical Therapy Treatment Patient Details Name: Gregory Perry MRN: [id number] DOB: Apr 02, 1951 Today's Date: 11/23/2015    History of Present Illness 65 y.o. male admitted for near syncope, fall and HTN. MRI (+) acute/subacute large L PCA territory infarct and remote parietal infarct. PMH significant for HTN, dysphagia.    PT Comments    Has progressed well.  Still R upper quadrant hemianopsia, but pt compensating decently well.  Should be safe at home alone for periods.  Follow Up Recommendations  Outpatient PT     Equipment Recommendations  None recommended by PT    Recommendations for Other Services       Precautions / Restrictions Precautions Precautions: Fall Precaution Comments: improving hemianopsia Restrictions Weight Bearing Restrictions: No    Mobility  Bed Mobility Overal bed mobility: Modified Independent                Transfers Overall transfer level: Modified independent                  Ambulation/Gait Ambulation/Gait assistance: Supervision (likely independent in the home) Ambulation Distance (Feet): 600 Feet Assistive device: None Gait Pattern/deviations: Step-through pattern Gait velocity: age appropriate   General Gait Details: steadu and fluid movement and handles moderate challeng including catching ball in all quadrants.   Stairs Stairs: Yes Stairs assistance: Modified independent (Device/Increase time) Stair Management: One rail Right;No rails;Alternating pattern;Forwards Number of Stairs: 25 General stair comments: safe and steady  Information systems manager mobility: Yes  Modified Rankin (Stroke Patients Only) Modified Rankin (Stroke Patients Only) Pre-Morbid Rankin Score: No symptoms Modified Rankin: Slight disability     Balance     Sitting balance-Leahy Scale: Normal       Standing balance-Leahy Scale: Normal                      Cognition Arousal/Alertness:  Awake/alert Behavior During Therapy: WFL for tasks assessed/performed Overall Cognitive Status: Within Functional Limits for tasks assessed                      Exercises      General Comments        Pertinent Vitals/Pain Pain Assessment: No/denies pain    Home Living                      Prior Function            PT Goals (current goals can now be found in the care plan section) Acute Rehab PT Goals Time For Goal Achievement: 12/01/15 Potential to Achieve Goals: Good Progress towards PT goals: Progressing toward goals    Frequency  Min 3X/week    PT Plan Current plan remains appropriate    Co-evaluation             End of Session   Activity Tolerance: Patient tolerated treatment well Patient left: in bed;with family/visitor present     Time: [id number] PT Time Calculation (min) (ACUTE ONLY): 20 min  Charges:                       G Codes:      Janeisha Ryle, Tessie Fass 11/23/2015, 2:58 PM 11/23/2015  Donnella Sham, PT [phone]  (pager)

## 2015-11-23 NOTE — Discharge Summary (Signed)
Physician Discharge Summary  Gregory Perry [id number] DOB: 15-Dec-1950  PCP: PROVIDER NOT IN SYSTEM  Admit date: 11/16/2015 Discharge date: 11/23/2015  Admitted From: Home Disposition:  Home  Recommendations for Outpatient Follow-up:  1. Chanetta Marshall, NP/Cardiology on 12/07/15 at 9:40 AM. To be seen with repeat labs (CBC & BMP). 2. Dr. Rosalin Hawking, Neurology in 2 months. 3. [facility]/PCP on 12/25/15 at 8:45 AM. 4. Dr. Harl Bowie, [facility] 5. Recommend outpatient follow-up of LFTs in 4 weeks.  Home Health: Outpatient PT and OT Equipment/Devices: None    Discharge Condition: Improved and stable.  CODE STATUS: Full  Diet recommendation: Heart healthy diet.  Brief/Interim Summary: 65 year old male with a history of hypertension, tobacco abuse, alcohol abuse presents with one-day history of gait instability and fall. Upon further questioning, the patient did not have loss of consciousness. However, the patient stated that he "did not feel right"the day prior to admission. He denied any dysarthria, focal collection may weakness, loss of vision. Initial CT of the brain revealed evolving acute or subacute infarct in the left occipital lobe. MRI revealed a left PCA infarct. As result, neurology was consulted and full stroke workup was undertaken. In addition, the patient has been complaining of dysphagia and odynophagia for the last 2-3 years. He has had intermittent vomiting because he is not able to swallow food. In addition he has lost 30 pounds in the past 6-12 months. On 04/29/2011, the patient had an EGD performed by Dr. Oletta Lamas which revealed a stricture in the proximal esophagus with a bloody ulcer. There was difficulty advancing the scope at that time which required ENT to assist.GI was consulted.  Discharge Diagnoses:  Principal Problem:   Embolic stroke [facility]) Active Problems:   Dysphasia   Essential hypertension   GERD (gastroesophageal reflux  disease)   Loss of weight   Tobacco abuse   Dysphagia   Cerebral infarction due to thrombosis of right posterior cerebral artery (HCC)   History of stroke   Cardiomyopathy, ischemic   Protein-calorie malnutrition, severe   Esophageal stricture   Pre-operative cardiovascular examination, LVEF < 35%   Candida esophagitis (HCC)  Stroke: left PCA territory acute infarct and chronic right parietal cortical infarct, embolic secondary to unknown source  Patient without change   Resultant right hemianopia, left LE decreased sensation  MRI L PCA infarct acute, and chronic right parietal cortical infarct  MRA Severe stenosis L PCA, moderated R PCA  CTA neck no significant stenosis  2D Echo EF 35-40%  TEE not able to be done due to hx of esophagus striction   Loop placed  LDL 136. Lipitor started. Follow LFTs in 4 weeks.  HgbA1c 4.9  Smoking cessation counseled.  No antithrombotic prior to admission, now on aspirin 325 mg po daily. Continue ASA on discharge.   Ongoing aggressive stroke risk factor management  Therapy recommendations: Outpatient PT and OT  Pt can not drive at this time. This has been discussed with patient.  Dysphagia/Esophageal stricture -6/26 Esophagram--marked stricture in the proximal thoracic esophagus at the level of the transverse aorta -GI has requested cardiac clearance and neurology to follow--cardiology has cleared the patient. - After neurology and cardiology clearance, patient underwent EGD and esophageal dilatation on 6/28. EGD findings also showed esophageal candidiasis which is likely the etiology for esophageal stricture and multiple gastric and duodenal ulcers. Patient is tolerating soft diet without difficulty swallowing. As per GI follow-up, continue fluconazole 200 MG daily for a 14 day course,  PPI twice daily 2 months followed by daily and okay to continue aspirin 325 MG daily.  - As per GI, follow up in office visit and plan repeat  EGD with dilatation in 3 months.  Hypertension - d/c amlodipine  -started metoprolol and lisinopril. Reasonable inpatient control.  Alcohol and tobacco abuse -CIWA--no signs or symptoms of withdrawal -NicoDerm patch-patient declines at discharge. -Cessation discussed -Last drink 4 days prior to admission -Patient drinks 2 beers with shot of vodka daily  Cardiomyopathy -started metoprolol and lisinopril -d/c amlodipine  -will need myoview in outpt setting-follow-up with cardiology. -Echo---EF 35-40%; diffuse HK, grade 1 DD, negative bubble study - As per cardiology follow-up today, negative bubble study, post ILR implantation Dr. Lovena Le. No TEE due to dysphagia and esophageal pathology. Low EF felt to be due to alcohol use. As per cardiology, after he recovers from acute CVA will arrange outpatient follow-up with Dr. Lovena Le for [place].  Tachycardia -11/17/15-Patient had 3-4 episodes of tachycardia up to 140 fasting one to 2 minutes -Patient was asymptomatic -telemetry review suggested possible atrial tachycardia -Started low-dose beta blocker. No further episodes. -loop recorder placed  Severe protein calorie malnutrition -Continue ensure  Hypokalemia/Hypomagnesemia -Replaced prior to discharge. Follow CBCs as outpatient.  Discharge Instructions      Discharge Instructions    Ambulatory referral to Neurology    Complete by:  As directed   Pt will follow up with Dr. Erlinda Hong at [facility] in about 2 months. Thanks.     Call MD for:    Complete by:  As directed   Strokelike symptoms or worsening swallowing difficulty.     Diet - low sodium heart healthy    Complete by:  As directed      Diet - low sodium heart healthy    Complete by:  As directed      Driving Restrictions    Complete by:  As directed   Patient has been advised not to drive until cleared by M.D. during outpatient follow-up.     Increase activity slowly    Complete by:  As directed      Increase activity slowly     Complete by:  As directed             Medication List    STOP taking these medications        ibuprofen 200 MG tablet  Commonly known as:  ADVIL,MOTRIN      TAKE these medications        aspirin EC 325 MG tablet  Take 1 tablet (325 mg total) by mouth daily.     atorvastatin 40 MG tablet  Commonly known as:  LIPITOR  Take 1 tablet (40 mg total) by mouth daily at 6 PM.     feeding supplement Liqd  Take 1 Container by mouth 3 (three) times daily between meals.     fluconazole 40 MG/ML suspension  Commonly known as:  DIFLUCAN  Take 5 mLs (200 mg total) by mouth daily.  Start taking on:  11/24/2015     lisinopril 5 MG tablet  Commonly known as:  PRINIVIL,ZESTRIL  Take 1 tablet (5 mg total) by mouth daily.     metoprolol tartrate 25 MG tablet  Commonly known as:  LOPRESSOR  Take 1 tablet (25 mg total) by mouth 2 (two) times daily.     pantoprazole 40 MG tablet  Commonly known as:  PROTONIX  Take 1 tablet (40 mg total) by mouth 2 (two) times daily before a  meal.       Follow-up Information    Follow up with Chanetta Marshall, NP On 12/07/2015.   Specialty:  Cardiology   Why:  at 9:40AM. To be seen with repeat labs (CBC & BMP).    Contact information:   [place] [address] [phone]       Follow up with Xu,Jindong, MD. Schedule an appointment as soon as possible for a visit in 2 months.   Specialty:  Neurology   Why:  stroke clinic   Contact information:   [address] [place] [address] [phone]       Follow up with [facility] On 12/25/2015.   [id number]. Your appointment time is 8:45 am. Please arrive 15 min early and bring a picture ID and your current medications.   Contact information:   [place] [phone]       Schedule an appointment as soon as possible for a visit with Harl Bowie, MD.   Specialty:  Gastroenterology   Contact information:    [place] [address] [phone]      No Known Allergies  Consultations:  Neurology  Cardiology  Gastroenterology   Procedures/Studies: Dg Chest 2 View  11/17/2015  CLINICAL DATA:  65 year old male with hypertension EXAM: CHEST  2 VIEW COMPARISON:  Chest radiograph dated 05/07/2011 FINDINGS: The heart size and mediastinal contours are within normal limits. Both lungs are clear. The visualized skeletal structures are unremarkable. IMPRESSION: No active cardiopulmonary disease. Electronically Signed   By: Anner Crete M.D.   On: 11/17/2015 00:45   Ct Head Wo Contrast  11/17/2015  CLINICAL DATA:  Acute onset of lightheadedness. Unsteady gait. High blood pressure. Initial encounter. EXAM: CT HEAD WITHOUT CONTRAST TECHNIQUE: Contiguous axial images were obtained from the base of the skull through the vertex without intravenous contrast. COMPARISON:  CT of the neck performed 05/01/2011 FINDINGS: There is an evolving large acute or subacute infarct involving the left occipital lobe, extending into the posterior aspect of the left temporal lobe. There is mild mass effect, without significant midline shift. No definite hemorrhagic transformation is noted, though there is slight irregularity about the left transverse sinus as it passes posterior to the infarct. Mild cerebellar atrophy is noted. Scattered periventricular and subcortical white matter change likely reflects small vessel ischemic microangiopathy. A chronic infarct is noted at the high right parietal lobe, with associated encephalomalacia. Prominence of the ventricles and sulci reflects mild cortical volume loss. The brainstem and fourth ventricle are within normal limits. There is no evidence of fracture; visualized osseous structures are unremarkable in appearance. The orbits are within normal limits. The paranasal sinuses and mastoid air cells are well-aerated. No significant soft tissue abnormalities are seen.  IMPRESSION: 1. Evolving large acute or subacute infarct involving the left occipital lobe, extending into the posterior aspect of the left temporal lobe. Mild mass effect, without significant midline shift. No definite hemorrhagic transformation is seen, though there is slight irregularity of the left transverse sinus as it passes posterior to the infarct. 2. Chronic infarct at the high right parietal lobe, with associated encephalomalacia. 3. Mild cortical volume loss and scattered small vessel ischemic microangiopathy. These results were called by telephone at the time of interpretation on 11/17/2015 at 2:05 am to Dr. Veatrice Kells, who verbally acknowledged these results. Electronically Signed   By: Garald Balding M.D.   On: 11/17/2015 02:07  Ct Angio Neck W Or Wo Contrast  11/17/2015  CLINICAL DATA:  Stroke EXAM: CT ANGIOGRAPHY NECK TECHNIQUE: Multidetector CT imaging of the neck was performed using the standard protocol during bolus administration of intravenous contrast. Multiplanar CT image reconstructions and MIPs were obtained to evaluate the vascular anatomy. Carotid stenosis measurements (when applicable) are obtained utilizing NASCET criteria, using the distal internal carotid diameter as the denominator. CONTRAST:  50 mL Isovue 370 IV COMPARISON:  .MRI head 11/17/2015 FINDINGS: Aortic arch: Normal aortic arch.  Proximal great vessels are normal. Right carotid system: Right common carotid artery widely patent. Minimal atherosclerotic calcification of the carotid bifurcation without significant stenosis or dissection Left carotid system: Left common carotid artery widely patent. Mild atherosclerotic calcification of the carotid bifurcation without stenosis. No dissection. Vertebral arteries:Left vertebral dominant. Both vertebral arteries patent without stenosis or dissection. Both vertebral arteries contribute to the basilar. Skeleton: Moderate cervical spine degenerative changes with spondylosis  diffusely. No fracture or bone lesion. Other neck: .  Apical blebs.  No apical mass or infiltrate. IMPRESSION: No significant carotid or vertebral artery stenosis in the neck. Minimal atherosclerotic disease at the carotid bifurcation bilaterally. Electronically Signed   By: Franchot Gallo M.D.   On: 11/17/2015 10:57   Mr Brain Wo Contrast  11/17/2015  CLINICAL DATA:  Stroke EXAM: MRI HEAD WITHOUT CONTRAST MRA HEAD WITHOUT CONTRAST TECHNIQUE: Multiplanar, multiecho pulse sequences of the brain and surrounding structures were obtained without intravenous contrast. Angiographic images of the head were obtained using MRA technique without contrast. COMPARISON:  CT head 11/17/2015 FINDINGS: MRI HEAD FINDINGS Acute left posterior cerebral artery infarct. Restricted diffusion in the left posterior temporal and occipital lobe. Small area of acute infarct in the left thalamus. No associated hemorrhage. No other areas of acute infarct Chronic infarct right parietal cortex. Chronic microvascular ischemic changes throughout the white matter. Chronic infarct left caudate and right thalamus. Slight chronic ischemia left pons. Negative for intracranial hemorrhage. Negative for mass or edema. No shift of the midline structures. Paranasal sinuses clear.  Normal orbit.  Normal pituitary. MRA HEAD FINDINGS Left vertebral artery dominant. Both vertebral arteries contribute to the basilar. Prominent AICA with moderate stenosis of the origin of the left AICA. Basilar widely patent. Superior cerebellar arteries patent bilaterally. Moderate stenosis of the right posterior cerebral artery. Severe stenosis left posterior cerebral artery with better flow distally. This is the area of acute infarct. Cavernous carotid shows mild irregularity. Moderate focal stenosis left cavernous carotid. Right cavernous carotid patent. Anterior and middle cerebral arteries patent bilaterally. There is artifact in the left M1 segment. No large vessel  occlusion or flow limiting stenosis. Negative for cerebral aneurysm. IMPRESSION: Acute infarct left PCA territory.  No hemorrhage Extensive chronic ischemic changes. Severe stenosis left posterior cerebral artery. Moderate stenosis right posterior cerebral artery. Electronically Signed   By: Franchot Gallo M.D.   On: 11/17/2015 07:28   Dg Esophagus  11/20/2015  CLINICAL DATA:  Dysphagia. Soft food and medicine lodging in upper esophagus. History of upper esophageal stricture. EXAM: ESOPHOGRAM/BARIUM SWALLOW TECHNIQUE: Single contrast examination was performed using  thick barium. FLUOROSCOPY TIME:  Radiation Exposure Index (as provided by the fluoroscopic device): If the device does not provide the exposure index: Fluoroscopy Time:  0 minutes and 26 seconds. Number of Acquired Images: COMPARISON:  03/11/2011. FINDINGS: Patient was given an initial swallow of thick barium. This immediately demonstrates a marked gated narrowing of the proximal esophagus (approximately the level of the transverse aorta) beyond which only  a trace amount of barium passes. This creates a "String sign" over a several cm segment of the proximal thoracic esophagus. The patient immediately had symptoms of fullness and sticking sensation in the esophagus and vomited the barium almost immediately. Given that only minimal barium was able to pass beyond the stricture, the exam was stopped. IMPRESSION: Marked stricture/ narrowing of the proximal thoracic esophagus, at approximately level of the transverse aorta, beyond which only minimal barium passes. This is near the location of the stricture seen on previous imaging. Upper endoscopy recommended to further elucidate etiology. Electronically Signed   By: Misty Stanley M.D.   On: 11/20/2015 09:41   Mr Jodene Nam Head/brain Wo Cm  11/17/2015  CLINICAL DATA:  Stroke EXAM: MRI HEAD WITHOUT CONTRAST MRA HEAD WITHOUT CONTRAST TECHNIQUE: Multiplanar, multiecho pulse sequences of the brain and surrounding  structures were obtained without intravenous contrast. Angiographic images of the head were obtained using MRA technique without contrast. COMPARISON:  CT head 11/17/2015 FINDINGS: MRI HEAD FINDINGS Acute left posterior cerebral artery infarct. Restricted diffusion in the left posterior temporal and occipital lobe. Small area of acute infarct in the left thalamus. No associated hemorrhage. No other areas of acute infarct Chronic infarct right parietal cortex. Chronic microvascular ischemic changes throughout the white matter. Chronic infarct left caudate and right thalamus. Slight chronic ischemia left pons. Negative for intracranial hemorrhage. Negative for mass or edema. No shift of the midline structures. Paranasal sinuses clear.  Normal orbit.  Normal pituitary. MRA HEAD FINDINGS Left vertebral artery dominant. Both vertebral arteries contribute to the basilar. Prominent AICA with moderate stenosis of the origin of the left AICA. Basilar widely patent. Superior cerebellar arteries patent bilaterally. Moderate stenosis of the right posterior cerebral artery. Severe stenosis left posterior cerebral artery with better flow distally. This is the area of acute infarct. Cavernous carotid shows mild irregularity. Moderate focal stenosis left cavernous carotid. Right cavernous carotid patent. Anterior and middle cerebral arteries patent bilaterally. There is artifact in the left M1 segment. No large vessel occlusion or flow limiting stenosis. Negative for cerebral aneurysm. IMPRESSION: Acute infarct left PCA territory.  No hemorrhage Extensive chronic ischemic changes. Severe stenosis left posterior cerebral artery. Moderate stenosis right posterior cerebral artery. Electronically Signed   By: Franchot Gallo M.D.   On: 11/17/2015 07:28  EGD 11/22/15: Impression: - Benign-appearing esophageal stenoses. Dilated.   Biopsied.  - Monilial esophagitis.  Biopsied.  - Non-bleeding gastric ulcers with pigmented   material.  - Multiple non-bleeding duodenal ulcers with no   stigmata of bleeding.    Subjective: Denies complaints. Able to swallow food without difficulty swallowing or pain. No nausea or vomiting. Denies any other complaints.  Discharge Exam:  Filed Vitals:   11/23/15 0123 11/23/15 0557 11/23/15 0906 11/23/15 1322  BP: 118/70 125/71 119/73 149/90  Pulse: 64 61 73 64  Temp: 98.1 F (36.7 C) 98.9 F (37.2 C) 98.8 F (37.1 C) 97.7 F (36.5 C)  TempSrc: Oral Oral Oral Oral  Resp: 18 18 18 18   Height:      Weight:      SpO2: 100% 100% 100% 100%    General: Pt ambulating comfortably in the room. Sister at bedside. Cardiovascular: S1 & S2 heard, RRR, S1/S2 +. No murmurs, rubs, gallops or clicks. No JVD or pedal edema. Respiratory: Clear to auscultation without wheezing, rhonchi or crackles. No increased work of breathing. Abdominal:  Non distended, non tender & soft. No organomegaly or masses appreciated. Normal bowel sounds heard. CNS: Alert  and oriented. No focal deficits. Extremities: no edema, no cyanosis    The results of significant diagnostics from this hospitalization (including imaging, microbiology, ancillary and laboratory) are listed below for reference.     Microbiology: No results found for this or any previous visit (from the past 240 hour(s)).   Labs: BNP (last 3 results) No results for input(s): BNP in the last 8760 hours. Basic Metabolic Panel:  Recent Labs Lab 11/18/15 0545 11/19/15 0612 11/21/15 0356 11/22/15 0550 11/23/15 0756  NA 136 140 137 137 137  K 3.6 3.4* 3.2* 3.1* 3.4*  CL 102 107 102 101 104  CO2 23 23 25 24 25   GLUCOSE 83 77 79 91 112*  BUN 16 18 9 15 9   CREATININE 1.05 0.88 0.87 0.84 0.81  CALCIUM 9.9 9.2 9.3 9.1 9.1  MG 1.8  --  1.6* 1.8  --    Liver Function  Tests:  Recent Labs Lab 11/17/15 0426  AST 36  ALT 18  ALKPHOS 69  BILITOT 1.3*  PROT 8.3*  ALBUMIN 4.2   No results for input(s): LIPASE, AMYLASE in the last 168 hours. No results for input(s): AMMONIA in the last 168 hours. CBC:  Recent Labs Lab 11/16/15 2101 11/17/15 0426 11/17/15 1656  WBC 9.5 8.3 8.9  NEUTROABS  --  5.7  --   HGB 15.3 14.0 13.5  HCT 43.9 40.6 40.0  MCV 88.5 88.1 88.3  PLT 267 278 260   Cardiac Enzymes: No results for input(s): CKTOTAL, CKMB, CKMBINDEX, TROPONINI in the last 168 hours. BNP: Invalid input(s): POCBNP CBG:  Recent Labs Lab 11/16/15 2346  GLUCAP 96   D-Dimer No results for input(s): DDIMER in the last 72 hours. Hgb A1c No results for input(s): HGBA1C in the last 72 hours. Lipid Profile No results for input(s): CHOL, HDL, LDLCALC, TRIG, CHOLHDL, LDLDIRECT in the last 72 hours. Thyroid function studies No results for input(s): TSH, T4TOTAL, T3FREE, THYROIDAB in the last 72 hours.  Invalid input(s): FREET3 Anemia work up No results for input(s): VITAMINB12, FOLATE, FERRITIN, TIBC, IRON, RETICCTPCT in the last 72 hours. Urinalysis    Component Value Date/Time   COLORURINE AMBER* 11/16/2015 2330   APPEARANCEUR CLOUDY* 11/16/2015 2330   LABSPEC 1.032* 11/16/2015 2330   PHURINE 5.5 11/16/2015 2330   GLUCOSEU NEGATIVE 11/16/2015 2330   HGBUR MODERATE* 11/16/2015 2330   BILIRUBINUR MODERATE* 11/16/2015 2330   KETONESUR 40* 11/16/2015 2330   PROTEINUR 100* 11/16/2015 2330   NITRITE NEGATIVE 11/16/2015 2330   LEUKOCYTESUR NEGATIVE 11/16/2015 2330   Sepsis Labs Invalid input(s): PROCALCITONIN,  WBC,  LACTICIDVEN    Time coordinating discharge: Over 30 minutes  SIGNED:  Vernell Leep, MD, FACP, FHM. [facility] Pager [phone]  If 7PM-7AM, please contact night-coverage [email or web address] Password [email or web address] 11/23/2015, 2:12 PM

## 2015-11-23 NOTE — Discharge Instructions (Signed)
Stroke Prevention Some medical conditions and behaviors are associated with an increased chance of having a stroke. You may prevent a stroke by making healthy choices and managing medical conditions. HOW CAN I REDUCE MY RISK OF HAVING A STROKE?   Stay physically active. Get at least 30 minutes of activity on most or all days.  Do not smoke. It may also be helpful to avoid exposure to secondhand smoke.  Limit alcohol use. Moderate alcohol use is considered to be:  No more than 2 drinks per day for men.  No more than 1 drink per day for nonpregnant women.  Eat healthy foods. This involves:  Eating 5 or more servings of fruits and vegetables a day.  Making dietary changes that address high blood pressure (hypertension), high cholesterol, diabetes, or obesity.  Manage your cholesterol levels.  Making food choices that are high in fiber and low in saturated fat, trans fat, and cholesterol may control cholesterol levels.  Take any prescribed medicines to control cholesterol as directed by your health care provider.  Manage your diabetes.  Controlling your carbohydrate and sugar intake is recommended to manage diabetes.  Take any prescribed medicines to control diabetes as directed by your health care provider.  Control your hypertension.  Making food choices that are low in salt (sodium), saturated fat, trans fat, and cholesterol is recommended to manage hypertension.  Ask your health care provider if you need treatment to lower your blood pressure. Take any prescribed medicines to control hypertension as directed by your health care provider.  If you are 18-39 years of age, have your blood pressure checked every 3-5 years. If you are 40 years of age or older, have your blood pressure checked every year.  Maintain a healthy weight.  Reducing calorie intake and making food choices that are low in sodium, saturated fat, trans fat, and cholesterol are recommended to manage  weight.  Stop drug abuse.  Avoid taking birth control pills.  Talk to your health care provider about the risks of taking birth control pills if you are over 35 years old, smoke, get migraines, or have ever had a blood clot.  Get evaluated for sleep disorders (sleep apnea).  Talk to your health care provider about getting a sleep evaluation if you snore a lot or have excessive sleepiness.  Take medicines only as directed by your health care provider.  For some people, aspirin or blood thinners (anticoagulants) are helpful in reducing the risk of forming abnormal blood clots that can lead to stroke. If you have the irregular heart rhythm of atrial fibrillation, you should be on a blood thinner unless there is a good reason you cannot take them.  Understand all your medicine instructions.  Make sure that other conditions (such as anemia or atherosclerosis) are addressed. SEEK IMMEDIATE MEDICAL CARE IF:   You have sudden weakness or numbness of the face, arm, or leg, especially on one side of the body.  Your face or eyelid droops to one side.  You have sudden confusion.  You have trouble speaking (aphasia) or understanding.  You have sudden trouble seeing in one or both eyes.  You have sudden trouble walking.  You have dizziness.  You have a loss of balance or coordination.  You have a sudden, severe headache with no known cause.  You have new chest pain or an irregular heartbeat. Any of these symptoms may represent a serious problem that is an emergency. Do not wait to see if the symptoms will   go away. Get medical help at once. Call your local emergency services (911 in [place] Do not drive yourself to the hospital.   This information is not intended to replace advice given to you by your health care provider. Make sure you discuss any questions you have with your health care provider.   Document Released: 06/20/2004 Document Revised: 06/03/2014 Document Reviewed:  11/13/2012 Elsevier Interactive Patient Education 2016 Elsevier Inc.  

## 2015-11-23 NOTE — Anesthesia Postprocedure Evaluation (Signed)
Anesthesia Post Note  Patient: Gregory Perry  Procedure(s) Performed: Procedure(s) (LRB): ESOPHAGOGASTRODUODENOSCOPY (EGD) (N/A) BALLOON DILATION (N/A)  Patient location during evaluation: PACU Anesthesia Type: MAC Level of consciousness: awake and alert Pain management: pain level controlled Vital Signs Assessment: post-procedure vital signs reviewed and stable Respiratory status: spontaneous breathing, nonlabored ventilation, respiratory function stable and patient connected to nasal cannula oxygen Cardiovascular status: stable and blood pressure returned to baseline Anesthetic complications: no    Last Vitals:  Filed Vitals:   11/23/15 0906 11/23/15 1322  BP: 119/73 149/90  Pulse: 73 64  Temp: 37.1 C 36.5 C  Resp: 18 18    Last Pain:  Filed Vitals:   11/23/15 1408  PainSc: 0-No pain                 Milaya Hora S

## 2015-11-24 NOTE — Care Management (Signed)
Pt discharged yesterday before Outpatient PT/OT could be set up. CM called patients sister [name] yesterday and was not able to reach her and she did not return the call. CM called [name] this morning and asked about Outpatient therapy. [name] was interested in patient attending the [facility]. CM obtained a written prescription from the MD and faxed the prescription to [facility].

## 2015-12-01 ENCOUNTER — Other Ambulatory Visit: Payer: Self-pay

## 2015-12-01 NOTE — Patient Outreach (Signed)
[facility]) Care Management  12/01/2015  YONATAN ZOTTOLA 08-08-1950 [id number]   REFERRAL SOURCE:  EMMI STROKE TRANSITION PROGRAM REFERRAL REASON:  Smoked or has been around smoke  Telephone call to patient regarding EMMI stroke program.  Unable to reach patient.  HIPAA compliant voice message left with call back phone number.   PLAN; RNCM will attempt 2nd telephone call to patient within 1 week.  Quinn Plowman RN,BSN,CCM [facility] Telephonic  [phone]

## 2015-12-04 ENCOUNTER — Other Ambulatory Visit: Payer: Self-pay

## 2015-12-04 NOTE — Patient Outreach (Signed)
[facility]) Care Management  12/04/2015  Gregory Perry [birth year] [id number]   REFERRAL DATE; 12/01/15 REFERRAL SOURCE; EMMI stroke program REFERRAL REASON: Smoked or has been around smoke -yes  Telephone call to patient regarding EMMI stroke program referral.  Unable to reach patient.  HIPAA compliant voice message left with call back phone number.   PLAN; RNCM will attempt 3rd telephone call to patient within 1 week.  Quinn Plowman RN,BSN,CCM [facility] Telephonic  [phone]  Quinn Plowman RN,BSN,CCM [facility] Telephonic  [phone]

## 2015-12-06 NOTE — Progress Notes (Signed)
Electrophysiology Office Note Date: 12/07/2015  ID:  TARAS UTT, DOB 10-24-1950, MRN [id number]  PCP: No PCP Per Patient Primary Cardiologist: Johnsie Cancel Electrophysiologist: Lovena Le  CC: hospitalization follow up  Gregory Perry is a 65 y.o. male seen today for Dr Lovena Le.  He presents today for post hospital followup after being admitted with acute CVA. We placed ILR at that time.  The patient was also found to have newly depressed EF.  Plan was for outpatient myoview and medication optimization.  Since discharge, the patient reports doing very well.  He denies chest pain, palpitations, dyspnea, PND, orthopnea, nausea, vomiting, dizziness, syncope, edema, weight gain, or early satiety.  Past Medical History  Diagnosis Date  . Hypertension   . Dysphagia   . Headache(784.0)     history of  . Cardiomyopathy ([place])   . CVA (cerebral infarction)    Past Surgical History  Procedure Laterality Date  . Esophagogastroduodenoscopy  04/29/2011    Procedure: ESOPHAGOGASTRODUODENOSCOPY (EGD);  Surgeon: Winfield Cunas., MD;  Location: [facility];  Service: Endoscopy;  Laterality: N/A;  . Direct laryngoscopy  05/07/2011    Procedure: DIRECT LARYNGOSCOPY;  Surgeon: Rozetta Nunnery, MD;  Location: [facility];  Service: ENT;  Laterality: N/A;  ESOPHAGOSCOPY  WITH DILATION  . Ep implantable device N/A 11/17/2015    Procedure: Loop Recorder Insertion;  Surgeon: Evans Lance, MD;  Location: [place] CV LAB;  Service: Cardiovascular;  Laterality: N/A;  . Esophagogastroduodenoscopy N/A 11/22/2015    Procedure: ESOPHAGOGASTRODUODENOSCOPY (EGD);  Surgeon: Mauri Pole, MD;  Location: [facility]COPY;  Service: Endoscopy;  Laterality: N/A;  . Balloon dilation N/A 11/22/2015    Procedure: BALLOON DILATION;  Surgeon: Mauri Pole, MD;  Location: [place] ENDOSCOPY;  Service: Endoscopy;  Laterality: N/A;    Current Outpatient Prescriptions  Medication Sig Dispense Refill  . aspirin EC 325  MG tablet Take 1 tablet (325 mg total) by mouth daily. 30 tablet 1  . atorvastatin (LIPITOR) 40 MG tablet Take 1 tablet (40 mg total) by mouth daily at 6 PM. 30 tablet 1  . feeding supplement (BOOST / RESOURCE BREEZE) LIQD Take 1 Container by mouth 3 (three) times daily between meals. 90 Container 0  . fluconazole (DIFLUCAN) 40 MG/ML suspension Take 5 mLs (200 mg total) by mouth daily. 65 mL 0  . lisinopril (PRINIVIL,ZESTRIL) 5 MG tablet Take 1 tablet (5 mg total) by mouth daily. 30 tablet 1  . metoprolol tartrate (LOPRESSOR) 25 MG tablet Take 1 tablet (25 mg total) by mouth 2 (two) times daily. 60 tablet 1  . pantoprazole (PROTONIX) 40 MG tablet Take 1 tablet (40 mg total) by mouth 2 (two) times daily before a meal. 60 tablet 1   No current facility-administered medications for this visit.    Allergies:   Review of patient's allergies indicates no known allergies.   Social History: Social History   Social History  . Marital Status: Widowed    Spouse Name: N/A  . Number of Children: N/A  . Years of Education: N/A   Occupational History  . Not on file.   Social History Main Topics  . Smoking status: Current Every Day Smoker -- 0.50 packs/day for 30 years    Types: Cigarettes  . Smokeless tobacco: Not on file  . Alcohol Use: 1.8 oz/week    3 Shots of liquor per week     Comment: occassional  . Drug Use: Yes    Special: Marijuana  .  Sexual Activity: Not on file   Other Topics Concern  . Not on file   Social History Narrative   Review of Systems: All other systems reviewed and are otherwise negative except as noted above.   Physical Exam: VS:  BP 124/78 mmHg  Pulse 61  Ht 5\' 11"  (1.803 m)  Wt 142 lb (64.411 kg)  BMI 19.81 kg/m2 , BMI Body mass index is 19.81 kg/(m^2). Wt Readings from Last 3 Encounters:  12/07/15 142 lb (64.411 kg)  11/22/15 142 lb (64.411 kg)  05/07/11 160 lb (72.576 kg)    GEN- The patient is elderly and thin appearing, alert and oriented x 3  today.   HEENT: normocephalic, atraumatic; sclera clear, conjunctiva pink; hearing intact; oropharynx clear; poor dentition Lungs- Clear to ausculation bilaterally, normal work of breathing.  No wheezes, rales, rhonchi Heart- Regular rate and rhythm GI- soft, non-tender, non-distended, bowel sounds present Extremities- no clubbing, cyanosis, or edema; DP/PT/radial pulses 2+ bilaterally MS- no significant deformity or atrophy Skin- warm and dry, no rash or lesion  Psych- euthymic mood, full affect Neuro- strength and sensation are intact   EKG:  EKG is ordered today. The ekg ordered today shows sinus rhythm, rate 61  Recent Labs: 11/17/2015: ALT 18; Hemoglobin 13.5; Platelets 260 11/22/2015: Magnesium 1.8 11/23/2015: BUN 9; Creatinine, Ser 0.81; Potassium 3.4*; Sodium 137    Other studies Reviewed: Additional studies/ records that were reviewed today include: hospital records  Assessment and Plan: 1. Cryptogenic stroke Continue monitoring for AF with ILR Compliance with monitoring encouraged today Wound well healed, no questions about home monitor    2.  Cardiomyopathy ?ETOH related Will obtain myoview at this time to further evaluate Continue ACE-I/BB Repeat echo in several months   3.  Chronic systolic heart failure Euvolemic on exam Continue current therapy   Current medicines are reviewed at length with the patient today.   The patient does not have concerns regarding his medicines.  The following changes were made today:  none  Labs/ tests ordered today include: Orders Placed This Encounter  Procedures  . Myocardial Perfusion Imaging  . EKG 12-Lead     Disposition:   Follow up with me in 6-8 weeks      Signed, Chanetta Marshall, NP 12/07/2015 1:41 PM   [address] [place] [address] [phone] (office) [phone] (fax)

## 2015-12-07 ENCOUNTER — Encounter: Payer: Self-pay | Admitting: Internal Medicine

## 2015-12-07 ENCOUNTER — Ambulatory Visit (INDEPENDENT_AMBULATORY_CARE_PROVIDER_SITE_OTHER): Payer: Self-pay | Admitting: Nurse Practitioner

## 2015-12-07 ENCOUNTER — Encounter (INDEPENDENT_AMBULATORY_CARE_PROVIDER_SITE_OTHER): Payer: Self-pay

## 2015-12-07 ENCOUNTER — Encounter: Payer: Self-pay | Admitting: Nurse Practitioner

## 2015-12-07 ENCOUNTER — Other Ambulatory Visit: Payer: Self-pay

## 2015-12-07 VITALS — BP 124/78 | HR 61 | Ht 71.0 in | Wt 142.0 lb

## 2015-12-07 DIAGNOSIS — I639 Cerebral infarction, unspecified: Secondary | ICD-10-CM

## 2015-12-07 DIAGNOSIS — I5022 Chronic systolic (congestive) heart failure: Secondary | ICD-10-CM

## 2015-12-07 NOTE — Patient Instructions (Signed)
Medication Instructions:   Your physician recommends that you continue on your current medications as directed. Please refer to the Current Medication list given to you today.    If you need a refill on your cardiac medications before your next appointment, please call your pharmacy.  Labwork: NONE ORDER TODAY    Testing/Procedures: Your physician has requested that you have en exercise stress myoview. For further information please visit [email or web address]. Please follow instruction sheet, as given.    Follow-Up: 2 MONTHS WITH AMBER    Any Other Special Instructions Will Be Listed Below (If Applicable).

## 2015-12-07 NOTE — Patient Outreach (Signed)
[facility]) Care Management  12/07/2015  BUSH BUEHRING 01-22-1951 [id number]  Third telephone call to patient regarding EMMI stroke referral.  Unable to reach patient. HIPAA compliant voice message left with call back phone number.   PLAN:  RNCM will send patient outreach letter and know before you go sheet.  Quinn Plowman RN,BSN,CCM [facility] Telephonic  [phone]

## 2015-12-21 ENCOUNTER — Telehealth (HOSPITAL_COMMUNITY): Payer: Self-pay | Admitting: *Deleted

## 2015-12-21 NOTE — Telephone Encounter (Signed)
Left message on voicemail per DPR in reference to upcoming appointment scheduled on 12/26/15 with detailed instructions given per Myocardial Perfusion Study Information Sheet for the test. LM to arrive 15 minutes early, and that it is imperative to arrive on time for appointment to keep from having the test rescheduled. If you need to cancel or reschedule your appointment, please call the office within 24 hours of your appointment. Failure to do so may result in a cancellation of your appointment, and a $50 no show fee. Phone number given for call back for any questions.  Hubbard Robinson, RN

## 2015-12-25 ENCOUNTER — Ambulatory Visit (INDEPENDENT_AMBULATORY_CARE_PROVIDER_SITE_OTHER): Payer: Self-pay | Admitting: Family Medicine

## 2015-12-25 ENCOUNTER — Encounter: Payer: Self-pay | Admitting: Family Medicine

## 2015-12-25 VITALS — BP 147/91 | HR 57 | Temp 97.6°F | Resp 16 | Ht 71.0 in | Wt 151.0 lb

## 2015-12-25 DIAGNOSIS — Z1159 Encounter for screening for other viral diseases: Secondary | ICD-10-CM

## 2015-12-25 DIAGNOSIS — Z114 Encounter for screening for human immunodeficiency virus [HIV]: Secondary | ICD-10-CM

## 2015-12-25 DIAGNOSIS — I10 Essential (primary) hypertension: Secondary | ICD-10-CM

## 2015-12-25 LAB — CBC WITH DIFFERENTIAL/PLATELET
BASOS ABS: 0 {cells}/uL (ref 0–200)
Basophils Relative: 0 %
EOS ABS: 120 {cells}/uL (ref 15–500)
Eosinophils Relative: 2 %
HEMATOCRIT: 37.7 % — AB (ref 38.5–50.0)
HEMOGLOBIN: 12.7 g/dL — AB (ref 13.2–17.1)
Lymphocytes Relative: 50 %
Lymphs Abs: 3000 cells/uL (ref 850–3900)
MCH: 30.2 pg (ref 27.0–33.0)
MCHC: 33.7 g/dL (ref 32.0–36.0)
MCV: 89.5 fL (ref 80.0–100.0)
MONO ABS: 300 {cells}/uL (ref 200–950)
MPV: 10.2 fL (ref 7.5–12.5)
Monocytes Relative: 5 %
NEUTROS PCT: 43 %
Neutro Abs: 2580 cells/uL (ref 1500–7800)
Platelets: 203 10*3/uL (ref 140–400)
RBC: 4.21 MIL/uL (ref 4.20–5.80)
RDW: 14.8 % (ref 11.0–15.0)
WBC: 6 10*3/uL (ref 3.8–10.8)

## 2015-12-25 LAB — HIV ANTIBODY (ROUTINE TESTING W REFLEX): HIV: NONREACTIVE

## 2015-12-25 NOTE — Progress Notes (Signed)
Gregory Perry, is a 65 y.o. male  [id number]  [id number]  DOB - 1950-10-07  CC:  Chief Complaint  Patient presents with  . Establish Care  . Hypertension       HPI: Lyzander Kolm is a 65 y.o. male here to establish care. He has a history of cardiomyopathy, CVA, Headche, hypertension and dysphagia. He was in hospital from 6-22 to 6-29 with CVA. He has a history of tobacco and alcohol abuse. He is to follow-up with cardiology for a myocardial perfusion study tomorrow. He is also to follow-up with neurologgy.  He has a loop recorder in place. He continues to smoke but reports stopping drinking a month ago. He is currently taking ASA, Lipitor, lisinopril, lopressor and protonix. Dosages are listed in his medication list. He denies needing refills at this time.   Health Maintenance: Will address at next visit.  No Known Allergies Past Medical History:  Diagnosis Date  . Cardiomyopathy ([place])   . CVA (cerebral infarction)   . Dysphagia   . Headache(784.0)    history of  . Hypertension    Current Outpatient Prescriptions on File Prior to Visit  Medication Sig Dispense Refill  . aspirin EC 325 MG tablet Take 1 tablet (325 mg total) by mouth daily. 30 tablet 1  . atorvastatin (LIPITOR) 40 MG tablet Take 1 tablet (40 mg total) by mouth daily at 6 PM. 30 tablet 1  . feeding supplement (BOOST / RESOURCE BREEZE) LIQD Take 1 Container by mouth 3 (three) times daily between meals. 90 Container 0  . lisinopril (PRINIVIL,ZESTRIL) 5 MG tablet Take 1 tablet (5 mg total) by mouth daily. 30 tablet 1  . metoprolol tartrate (LOPRESSOR) 25 MG tablet Take 1 tablet (25 mg total) by mouth 2 (two) times daily. 60 tablet 1  . pantoprazole (PROTONIX) 40 MG tablet Take 1 tablet (40 mg total) by mouth 2 (two) times daily before a meal. 60 tablet 1  . fluconazole (DIFLUCAN) 40 MG/ML suspension Take 5 mLs (200 mg total) by mouth daily. (Patient not taking: Reported on 12/25/2015) 65 mL 0   No current  facility-administered medications on file prior to visit.    History reviewed. No pertinent family history. Social History   Social History  . Marital status: Widowed    Spouse name: N/A  . Number of children: N/A  . Years of education: N/A   Occupational History  . Not on file.   Social History Main Topics  . Smoking status: Current Every Day Smoker    Packs/day: 0.50    Years: 30.00    Types: Cigarettes  . Smokeless tobacco: Never Used  . Alcohol use No     Comment: quit in 10/2015   . Drug use:     Types: Marijuana     Comment: occ  . Sexual activity: Not on file   Other Topics Concern  . Not on file   Social History Narrative  . No narrative on file    Review of Systems: Constitutional: Negative for fever, chills, appetite change, weight loss,  Fatigue. Skin: Negative for rashes or lesions of concern. HENT: Negative for ear pain, ear discharge.nose bleeds. Decreased hearing on right. Eyes: Negative for pain, discharge, redness, itching and visual disturbance. Neck: Negative for pain, stiffness Respiratory: Negative for cough, shortness of breath,   Cardiovascular: Negative for chest pain, palpitations and leg swelling. Gastrointestinal: Negative for abdominal pain, nausea, vomiting, diarrhea, constipations Genitourinary: Negative for dysuria, urgency, frequency, hematuria,  Musculoskeletal: Negative  for back pain, joint pain, joint  swelling, and gait problem.Negative for weakness. Neurological: Negative for dizziness, tremors, seizures, syncope,   light-headedness, numbness. + occ headaches Hematological: Negative for easy bruising or bleeding Psychiatric/Behavioral: Negative for depression, anxiety, decreased concentration, confusion   Objective:   Vitals:   12/25/15 0902  BP: (!) 147/91  Pulse: (!) 57  Resp: 16  Temp: 97.6 F (36.4 C)    Physical Exam: Constitutional: Patient appears well-developed and well-nourished. No distress. HENT:  Normocephalic, atraumatic, External right and left ear normal. Oropharynx is clear and moist.  Eyes: Conjunctivae and EOM are normal. PERRLA, no scleral icterus. Neck: Normal ROM. Neck supple. No lymphadenopathy, No thyromegaly. CVS: RRR, S1/S2 +, no murmurs, no gallops, no rubs Pulmonary: Effort and breath sounds normal, no stridor, rhonchi, wheezes, rales.  Abdominal: Soft. Normoactive BS,, no distension, tenderness, rebound or guarding.  Musculoskeletal: Normal range of motion. No edema and no tenderness.  Neuro: Alert.Normal muscle tone coordination. Non-focal Skin: Skin is warm and dry. No rash noted. Not diaphoretic. No erythema. No pallor. Psychiatric: Normal mood and affect. Behavior, judgment, thought content normal.  Lab Results  Component Value Date   WBC 8.9 11/17/2015   HGB 13.5 11/17/2015   HCT 40.0 11/17/2015   MCV 88.3 11/17/2015   PLT 260 11/17/2015   Lab Results  Component Value Date   CREATININE 0.81 11/23/2015   BUN 9 11/23/2015   NA 137 11/23/2015   K 3.4 (L) 11/23/2015   CL 104 11/23/2015   CO2 25 11/23/2015    Lab Results  Component Value Date   HGBA1C 4.9 11/17/2015   Lipid Panel     Component Value Date/Time   CHOL 222 (H) 11/17/2015 0426   TRIG 66 11/17/2015 0426   HDL 73 11/17/2015 0426   CHOLHDL 3.0 11/17/2015 0426   VLDL 13 11/17/2015 0426   LDLCALC 136 (H) 11/17/2015 0426       Assessment and plan:   1. Essential hypertension  - CBC with Differential - COMPLETE METABOLIC PANEL WITH GFR  2. Screening for HIV (human immunodeficiency virus)  - HIV antibody (with reflex)  3. Need for hepatitis C screening test  - Hepatitis C Antibody   Return in about 3 months (around 03/26/2016).  The patient was given clear instructions to go to ER or return to medical center if symptoms don't improve, worsen or new problems develop. The patient verbalized understanding.    Micheline Chapman FNP  12/25/2015, 3:18 PM

## 2015-12-26 ENCOUNTER — Ambulatory Visit (HOSPITAL_COMMUNITY): Payer: Self-pay | Attending: Cardiovascular Disease

## 2015-12-26 DIAGNOSIS — I119 Hypertensive heart disease without heart failure: Secondary | ICD-10-CM | POA: Insufficient documentation

## 2015-12-26 DIAGNOSIS — I639 Cerebral infarction, unspecified: Secondary | ICD-10-CM

## 2015-12-26 DIAGNOSIS — R9439 Abnormal result of other cardiovascular function study: Secondary | ICD-10-CM | POA: Insufficient documentation

## 2015-12-26 LAB — COMPLETE METABOLIC PANEL WITH GFR
ALBUMIN: 4 g/dL (ref 3.6–5.1)
ALK PHOS: 66 U/L (ref 40–115)
ALT: 14 U/L (ref 9–46)
AST: 22 U/L (ref 10–35)
BILIRUBIN TOTAL: 0.4 mg/dL (ref 0.2–1.2)
BUN: 16 mg/dL (ref 7–25)
CALCIUM: 9.4 mg/dL (ref 8.6–10.3)
CO2: 20 mmol/L (ref 20–31)
Chloride: 106 mmol/L (ref 98–110)
Creat: 0.8 mg/dL (ref 0.70–1.25)
Glucose, Bld: 76 mg/dL (ref 65–99)
POTASSIUM: 4.3 mmol/L (ref 3.5–5.3)
Sodium: 143 mmol/L (ref 135–146)
TOTAL PROTEIN: 7.2 g/dL (ref 6.1–8.1)

## 2015-12-26 LAB — MYOCARDIAL PERFUSION IMAGING
CHL CUP NUCLEAR SDS: 0
CHL CUP NUCLEAR SRS: 4
CHL CUP RESTING HR STRESS: 54 {beats}/min
LHR: 0.34
LV dias vol: 185 mL (ref 62–150)
LV sys vol: 118 mL
Peak HR: 93 {beats}/min
SSS: 4
TID: 1.09

## 2015-12-26 LAB — HEPATITIS C ANTIBODY: HCV AB: NEGATIVE

## 2015-12-26 MED ORDER — TECHNETIUM TC 99M TETROFOSMIN IV KIT
33.0000 | PACK | Freq: Once | INTRAVENOUS | Status: AC | PRN
  Administered 2015-12-26: 33 via INTRAVENOUS
  Filled 2015-12-26: qty 33

## 2015-12-26 MED ORDER — REGADENOSON 0.4 MG/5ML IV SOLN
0.4000 mg | Freq: Once | INTRAVENOUS | Status: AC
  Administered 2015-12-26: 0.4 mg via INTRAVENOUS

## 2015-12-26 MED ORDER — TECHNETIUM TC 99M TETROFOSMIN IV KIT
10.2000 | PACK | Freq: Once | INTRAVENOUS | Status: AC | PRN
  Administered 2015-12-26: 10 via INTRAVENOUS
  Filled 2015-12-26: qty 10

## 2016-01-04 ENCOUNTER — Ambulatory Visit (INDEPENDENT_AMBULATORY_CARE_PROVIDER_SITE_OTHER): Payer: Self-pay | Admitting: *Deleted

## 2016-01-04 ENCOUNTER — Telehealth: Payer: Self-pay

## 2016-01-04 ENCOUNTER — Encounter: Payer: Self-pay | Admitting: Nurse Practitioner

## 2016-01-04 DIAGNOSIS — I639 Cerebral infarction, unspecified: Secondary | ICD-10-CM

## 2016-01-04 MED ORDER — ATORVASTATIN CALCIUM 40 MG PO TABS: 40.0000 mg | ORAL_TABLET | Freq: Every day | ORAL | 1 refills | Status: DC

## 2016-01-04 MED ORDER — LISINOPRIL 5 MG PO TABS: 5.0000 mg | ORAL_TABLET | Freq: Every day | ORAL | 1 refills | Status: DC

## 2016-01-04 MED ORDER — METOPROLOL TARTRATE 25 MG PO TABS: 25.0000 mg | ORAL_TABLET | Freq: Two times a day (BID) | ORAL | 1 refills | Status: DC

## 2016-01-04 NOTE — Telephone Encounter (Signed)
Refills requested sent into pharmacy. Thanks!

## 2016-01-04 NOTE — Progress Notes (Signed)
Electrophysiology Office Note Date: 01/05/2016  ID:  DAXSTON ROBTOY, DOB [birth year], MRN [id number]  PCP: Sharon Seller, NP Primary Cardiologist: Johnsie Cancel Electrophysiologist: Lovena Le  CC: to discuss Gregory Perry is a 65 y.o. male seen today for Dr Lovena Le.  He presents today to discuss recent myoview results.  Since last being seen in clinic, the patient reports doing very well.  He continues to have some orthostatic intolerance. He denies chest pain, palpitations, dyspnea, PND, orthopnea, nausea, vomiting, dizziness, syncope, edema, weight gain, or early satiety.  Past Medical History:  Diagnosis Date  . Cardiomyopathy ([place])   . CVA (cerebral infarction)   . Dysphagia   . Headache(784.0)    history of  . Hypertension    Past Surgical History:  Procedure Laterality Date  . BALLOON DILATION N/A 11/22/2015   Procedure: BALLOON DILATION;  Surgeon: Mauri Pole, MD;  Location: [facility];  Service: Endoscopy;  Laterality: N/A;  . DIRECT LARYNGOSCOPY  05/07/2011   Procedure: DIRECT LARYNGOSCOPY;  Surgeon: Rozetta Nunnery, MD;  Location: [facility];  Service: ENT;  Laterality: N/A;  ESOPHAGOSCOPY  WITH DILATION  . EP IMPLANTABLE DEVICE N/A 11/17/2015   Procedure: Loop Recorder Insertion;  Surgeon: Evans Lance, MD;  Location: [facility];  Service: Cardiovascular;  Laterality: N/A;  . ESOPHAGOGASTRODUODENOSCOPY  04/29/2011   Procedure: ESOPHAGOGASTRODUODENOSCOPY (EGD);  Surgeon: Winfield Cunas., MD;  Location: [facility];  Service: Endoscopy;  Laterality: N/A;  . ESOPHAGOGASTRODUODENOSCOPY N/A 11/22/2015   Procedure: ESOPHAGOGASTRODUODENOSCOPY (EGD);  Surgeon: Mauri Pole, MD;  Location: [facility];  Service: Endoscopy;  Laterality: N/A;    Current Outpatient Prescriptions  Medication Sig Dispense Refill  . aspirin EC 325 MG tablet Take 1 tablet (325 mg total) by mouth daily. 30 tablet 1  . atorvastatin (LIPITOR) 40 MG tablet Take 1  tablet (40 mg total) by mouth daily at 6 PM. 30 tablet 1  . feeding supplement (BOOST / RESOURCE BREEZE) LIQD Take 1 Container by mouth 3 (three) times daily between meals. 90 Container 0  . fluconazole (DIFLUCAN) 40 MG/ML suspension Take 5 mLs (200 mg total) by mouth daily. 65 mL 0  . lisinopril (PRINIVIL,ZESTRIL) 5 MG tablet Take 1 tablet (5 mg total) by mouth daily. 30 tablet 1  . metoprolol tartrate (LOPRESSOR) 25 MG tablet Take 1 tablet (25 mg total) by mouth 2 (two) times daily. 60 tablet 1  . pantoprazole (PROTONIX) 40 MG tablet Take 1 tablet (40 mg total) by mouth 2 (two) times daily before a meal. 60 tablet 1   No current facility-administered medications for this visit.     Allergies:   Review of patient's allergies indicates no known allergies.   Social History: Social History   Social History  . Marital status: Widowed    Spouse name: N/A  . Number of children: N/A  . Years of education: N/A   Occupational History  . Not on file.   Social History Main Topics  . Smoking status: Current Every Day Smoker    Packs/day: 0.50    Years: 30.00    Types: Cigarettes  . Smokeless tobacco: Never Used  . Alcohol use No     Comment: quit in 10/2015   . Drug use:     Types: Marijuana     Comment: occ  . Sexual activity: Not on file   Other Topics Concern  . Not on file   Social History Narrative  . No  narrative on file   Review of Systems: All other systems reviewed and are otherwise negative except as noted above.   Physical Exam: VS:  BP 140/82   Pulse (!) 55   Ht 5\' 11"  (1.803 m)   Wt 144 lb (65.3 kg)   SpO2 99%   BMI 20.08 kg/m  , BMI Body mass index is 20.08 kg/m. Wt Readings from Last 3 Encounters:  01/05/16 144 lb (65.3 kg)  12/26/15 142 lb (64.4 kg)  12/25/15 151 lb (68.5 kg)    GEN- The patient is elderly and thin appearing, alert and oriented x 3 today.   HEENT: normocephalic, atraumatic; sclera clear, conjunctiva pink; hearing intact; oropharynx  clear; poor dentition Lungs- Clear to ausculation bilaterally, normal work of breathing.  No wheezes, rales, rhonchi Heart- Regular rate and rhythm GI- soft, non-tender, non-distended, bowel sounds present Extremities- no clubbing, cyanosis, or edema; DP/PT/radial pulses 2+ bilaterally MS- no significant deformity or atrophy Skin- warm and dry, no rash or lesion  Psych- euthymic mood, full affect Neuro- strength and sensation are intact   EKG:  EKG is not ordered today.  Recent Labs: 11/22/2015: Magnesium 1.8 12/25/2015: ALT 14; BUN 16; Creat 0.80; Hemoglobin 12.7; Platelets 203; Potassium 4.3; Sodium 143    Other studies Reviewed: Additional studies/ records that were reviewed today include: hospital records  Assessment and Plan: 1. Cryptogenic stroke Continue monitoring for AF with ILR - none to date  Compliance with monitoring encouraged today  2.  Cardiomyopathy Myoview showed findings consistent with prior MI Discussed with Dr Burt Knack.  Because of unknown cause of cardiomyopathy, proceeding with diagnostic catheterization is reasonable. I have discussed options with the patient including continuing medical therapy vs proceeding with LHC. He would like to proceed with LHC.  Risks, benefits reviewed with patient and brother and law today.     3.  Chronic systolic heart failure Euvolemic on exam Continue current therapy He has some orthostatic intolerance and so will hold off on up-titrating medical therapy until after LHC done.    Current medicines are reviewed at length with the patient today.   The patient does not have concerns regarding his medicines.  The following changes were made today:  none  Labs/ tests ordered today include: No orders of the defined types were placed in this encounter.    Disposition:   Follow up with me as scheduled    Signed, Chanetta Marshall, NP 01/05/2016 12:13 PM   [place]  [phone] (office) [phone] (fax)

## 2016-01-04 NOTE — Progress Notes (Signed)
Carelink Summary Report / Loop Recorder 

## 2016-01-05 ENCOUNTER — Ambulatory Visit (INDEPENDENT_AMBULATORY_CARE_PROVIDER_SITE_OTHER): Payer: Self-pay | Admitting: Nurse Practitioner

## 2016-01-05 ENCOUNTER — Encounter: Payer: Self-pay | Admitting: Nurse Practitioner

## 2016-01-05 VITALS — BP 140/82 | HR 55 | Ht 71.0 in | Wt 144.0 lb

## 2016-01-05 DIAGNOSIS — R9439 Abnormal result of other cardiovascular function study: Secondary | ICD-10-CM

## 2016-01-05 DIAGNOSIS — I639 Cerebral infarction, unspecified: Secondary | ICD-10-CM

## 2016-01-05 DIAGNOSIS — I5022 Chronic systolic (congestive) heart failure: Secondary | ICD-10-CM

## 2016-01-05 DIAGNOSIS — I420 Dilated cardiomyopathy: Secondary | ICD-10-CM

## 2016-01-05 NOTE — Patient Instructions (Addendum)
Medication Instructions: Your physician recommends that you continue on your current medications as directed. Please refer to the Current Medication list given to you today.   Labwork TODAY: BMET and CBC  Procedures/Testing: Your physician has requested that you have a cardiac catheterization. Cardiac catheterization is used to diagnose and/or treat various heart conditions. Doctors may recommend this procedure for a number of different reasons. The most common reason is to evaluate chest pain. Chest pain can be a symptom of coronary artery disease (CAD), and cardiac catheterization can show whether plaque is narrowing or blocking your heart's arteries. This procedure is also used to evaluate the valves, as well as measure the blood flow and oxygen levels in different parts of your heart. For further information please visit [email or web address]. Please follow instruction sheet, as given.  Cardiac Catheterization scheduled for 01/17/16 @ 11:30 am  Follow-Up: Your physician recommends that you keep your scheduled follow-up appointment with Chanetta Marshall, NP on 02/07/16 @ 9:40 am    Any Additional Special Instructions Will Be Listed Below (If Applicable).     If you need a refill on your cardiac medications before your next appointment, please call your pharmacy.

## 2016-01-08 ENCOUNTER — Encounter: Payer: Self-pay | Admitting: *Deleted

## 2016-01-14 ENCOUNTER — Other Ambulatory Visit: Payer: Self-pay | Admitting: Nurse Practitioner

## 2016-01-16 ENCOUNTER — Ambulatory Visit (INDEPENDENT_AMBULATORY_CARE_PROVIDER_SITE_OTHER): Payer: Self-pay | Admitting: Neurology

## 2016-01-16 ENCOUNTER — Encounter: Payer: Self-pay | Admitting: Neurology

## 2016-01-16 VITALS — BP 125/82 | HR 53 | Ht 71.0 in | Wt 148.2 lb

## 2016-01-16 DIAGNOSIS — I639 Cerebral infarction, unspecified: Secondary | ICD-10-CM

## 2016-01-16 DIAGNOSIS — I63331 Cerebral infarction due to thrombosis of right posterior cerebral artery: Secondary | ICD-10-CM

## 2016-01-16 DIAGNOSIS — I10 Essential (primary) hypertension: Secondary | ICD-10-CM

## 2016-01-16 DIAGNOSIS — I255 Ischemic cardiomyopathy: Secondary | ICD-10-CM

## 2016-01-16 DIAGNOSIS — Z720 Tobacco use: Secondary | ICD-10-CM

## 2016-01-16 NOTE — Patient Instructions (Addendum)
-   continue ASA and lipitor for stroke prevention for me. - quit smoking - Follow up with your primary care physician for stroke risk factor modification. Recommend maintain blood pressure goal <130/80, diabetes with hemoglobin A1c goal below 7.0% and lipids with LDL cholesterol goal below 70 mg/dL.  - check BP at home  - no driving at this time due to visual field difficulty - introduce the clinic study to you and let you think about it.  - continue loop recorder monitoring  - will do TCD emboli detection.  - follow up in 4 months

## 2016-01-17 ENCOUNTER — Ambulatory Visit (HOSPITAL_COMMUNITY)
Admission: RE | Admit: 2016-01-17 | Discharge: 2016-01-17 | Disposition: A | Discharge status: Home / Self Care | Payer: Self-pay | Source: Ambulatory Visit | Attending: Cardiovascular Disease | Admitting: Cardiovascular Disease

## 2016-01-17 ENCOUNTER — Encounter (HOSPITAL_COMMUNITY)
Admission: RE | Disposition: A | Discharge status: Home / Self Care | Payer: Self-pay | Source: Ambulatory Visit | Attending: Cardiovascular Disease

## 2016-01-17 DIAGNOSIS — R9439 Abnormal result of other cardiovascular function study: Secondary | ICD-10-CM | POA: Diagnosis present

## 2016-01-17 DIAGNOSIS — I2510 Atherosclerotic heart disease of native coronary artery without angina pectoris: Secondary | ICD-10-CM | POA: Insufficient documentation

## 2016-01-17 DIAGNOSIS — Z7982 Long term (current) use of aspirin: Secondary | ICD-10-CM | POA: Insufficient documentation

## 2016-01-17 DIAGNOSIS — I5022 Chronic systolic (congestive) heart failure: Secondary | ICD-10-CM | POA: Insufficient documentation

## 2016-01-17 DIAGNOSIS — I2584 Coronary atherosclerosis due to calcified coronary lesion: Secondary | ICD-10-CM | POA: Insufficient documentation

## 2016-01-17 DIAGNOSIS — I110 Hypertensive heart disease with heart failure: Secondary | ICD-10-CM | POA: Insufficient documentation

## 2016-01-17 DIAGNOSIS — F17210 Nicotine dependence, cigarettes, uncomplicated: Secondary | ICD-10-CM | POA: Insufficient documentation

## 2016-01-17 DIAGNOSIS — I429 Cardiomyopathy, unspecified: Secondary | ICD-10-CM | POA: Insufficient documentation

## 2016-01-17 DIAGNOSIS — I252 Old myocardial infarction: Secondary | ICD-10-CM | POA: Insufficient documentation

## 2016-01-17 DIAGNOSIS — F1290 Cannabis use, unspecified, uncomplicated: Secondary | ICD-10-CM | POA: Insufficient documentation

## 2016-01-17 DIAGNOSIS — Z8673 Personal history of transient ischemic attack (TIA), and cerebral infarction without residual deficits: Secondary | ICD-10-CM | POA: Insufficient documentation

## 2016-01-17 HISTORY — PX: CARDIAC CATHETERIZATION: SHX172

## 2016-01-17 LAB — COMPREHENSIVE METABOLIC PANEL
ALBUMIN: 3.5 g/dL (ref 3.5–5.0)
ALK PHOS: 66 U/L (ref 38–126)
ALT: 27 U/L (ref 17–63)
AST: 42 U/L — AB (ref 15–41)
Anion gap: 5 (ref 5–15)
BILIRUBIN TOTAL: 0.8 mg/dL (ref 0.3–1.2)
BUN: 10 mg/dL (ref 6–20)
CALCIUM: 9.2 mg/dL (ref 8.9–10.3)
CO2: 29 mmol/L (ref 22–32)
Chloride: 106 mmol/L (ref 101–111)
Creatinine, Ser: 0.91 mg/dL (ref 0.61–1.24)
GFR calc Af Amer: >60 mL/min (ref >60)
GFR calc non Af Amer: >60 mL/min (ref >60)
GLUCOSE: 79 mg/dL (ref 65–99)
Potassium: 3.5 mmol/L (ref 3.5–5.1)
Sodium: 140 mmol/L (ref 135–145)
TOTAL PROTEIN: 6.4 g/dL — AB (ref 6.5–8.1)

## 2016-01-17 LAB — CBC
HEMATOCRIT: 37 % — AB (ref 39.0–52.0)
HEMOGLOBIN: 12.2 g/dL — AB (ref 13.0–17.0)
MCH: 30.3 pg (ref 26.0–34.0)
MCHC: 33 g/dL (ref 30.0–36.0)
MCV: 91.8 fL (ref 78.0–100.0)
Platelets: 258 10*3/uL (ref 150–400)
RBC: 4.03 MIL/uL — AB (ref 4.22–5.81)
RDW: 14.8 % (ref 11.5–15.5)
WBC: 6.2 10*3/uL (ref 4.0–10.5)

## 2016-01-17 LAB — PROTIME-INR
INR: 1.06
Prothrombin Time: 13.9 seconds (ref 11.4–15.2)

## 2016-01-17 SURGERY — LEFT HEART CATH AND CORONARY ANGIOGRAPHY
Anesthesia: LOCAL

## 2016-01-17 MED ORDER — VERAPAMIL HCL 2.5 MG/ML IV SOLN
INTRAVENOUS | Status: AC
  Filled 2016-01-17: qty 2

## 2016-01-17 MED ORDER — LIDOCAINE HCL (PF) 1 % IJ SOLN
INTRAMUSCULAR | Status: DC | PRN
  Administered 2016-01-17: 2 mL

## 2016-01-17 MED ORDER — FENTANYL CITRATE (PF) 100 MCG/2ML IJ SOLN
INTRAMUSCULAR | Status: AC
Start: 2016-01-17 — End: 2016-01-17
  Filled 2016-01-17: qty 2

## 2016-01-17 MED ORDER — ACETAMINOPHEN 325 MG PO TABS: 650.0000 mg | ORAL_TABLET | ORAL | Status: DC | PRN

## 2016-01-17 MED ORDER — HEPARIN SODIUM (PORCINE) 1000 UNIT/ML IJ SOLN
INTRAMUSCULAR | Status: DC | PRN
  Administered 2016-01-17: 4000 [IU] via INTRAVENOUS

## 2016-01-17 MED ORDER — SODIUM CHLORIDE 0.9 % IV SOLN
INTRAVENOUS | Status: DC
  Administered 2016-01-17: 11:00:00 via INTRAVENOUS

## 2016-01-17 MED ORDER — SODIUM CHLORIDE 0.9% FLUSH: 3.0000 mL | INTRAVENOUS | Status: DC | PRN

## 2016-01-17 MED ORDER — ONDANSETRON HCL 4 MG/2ML IJ SOLN: 4.0000 mg | Freq: Four times a day (QID) | INTRAMUSCULAR | Status: DC | PRN

## 2016-01-17 MED ORDER — SODIUM CHLORIDE 0.9 % WEIGHT BASED INFUSION: 1.0000 mL/kg/h | INTRAVENOUS | Status: DC

## 2016-01-17 MED ORDER — HEPARIN SODIUM (PORCINE) 1000 UNIT/ML IJ SOLN
INTRAMUSCULAR | Status: AC
  Filled 2016-01-17: qty 1

## 2016-01-17 MED ORDER — HEPARIN (PORCINE) IN NACL 2-0.9 UNIT/ML-% IJ SOLN
INTRAMUSCULAR | Status: AC
Start: 2016-01-17 — End: 2016-01-17
  Filled 2016-01-17: qty 1500

## 2016-01-17 MED ORDER — SODIUM CHLORIDE 0.9% FLUSH: 3.0000 mL | Freq: Two times a day (BID) | INTRAVENOUS | Status: DC

## 2016-01-17 MED ORDER — HEPARIN (PORCINE) IN NACL 2-0.9 UNIT/ML-% IJ SOLN
INTRAMUSCULAR | Status: DC | PRN
  Administered 2016-01-17: 1500 mL

## 2016-01-17 MED ORDER — IOPAMIDOL (ISOVUE-370) INJECTION 76%
INTRAVENOUS | Status: DC | PRN
  Administered 2016-01-17: 45 mL via INTRA_ARTERIAL

## 2016-01-17 MED ORDER — MIDAZOLAM HCL 2 MG/2ML IJ SOLN
INTRAMUSCULAR | Status: AC
  Filled 2016-01-17: qty 2

## 2016-01-17 MED ORDER — MIDAZOLAM HCL 2 MG/2ML IJ SOLN
INTRAMUSCULAR | Status: DC | PRN
  Administered 2016-01-17: 2 mg via INTRAVENOUS

## 2016-01-17 MED ORDER — ASPIRIN 81 MG PO CHEW
CHEWABLE_TABLET | ORAL | Status: AC
  Filled 2016-01-17: qty 1

## 2016-01-17 MED ORDER — ASPIRIN 81 MG PO CHEW
81.0000 mg | CHEWABLE_TABLET | ORAL | Status: AC
  Administered 2016-01-17: 81 mg via ORAL

## 2016-01-17 MED ORDER — VERAPAMIL HCL 2.5 MG/ML IV SOLN
INTRAVENOUS | Status: DC | PRN
  Administered 2016-01-17: 10 mL via INTRA_ARTERIAL

## 2016-01-17 MED ORDER — LIDOCAINE HCL (PF) 1 % IJ SOLN
INTRAMUSCULAR | Status: AC
  Filled 2016-01-17: qty 30

## 2016-01-17 MED ORDER — SODIUM CHLORIDE 0.9 % IV SOLN
250.0000 mL | INTRAVENOUS | Status: DC | PRN
Start: 2016-01-17 — End: 2016-01-17

## 2016-01-17 MED ORDER — SODIUM CHLORIDE 0.9 % IV SOLN: 250.0000 mL | INTRAVENOUS | Status: DC | PRN

## 2016-01-17 MED ORDER — FENTANYL CITRATE (PF) 100 MCG/2ML IJ SOLN
INTRAMUSCULAR | Status: DC | PRN
  Administered 2016-01-17: 25 ug via INTRAVENOUS

## 2016-01-17 SURGICAL SUPPLY — 10 items
CATH INFINITI 5 FR JL3.5 (CATHETERS) ×1 IMPLANT
CATH INFINITI 5FR ANG PIGTAIL (CATHETERS) ×1 IMPLANT
CATH INFINITI JR4 5F (CATHETERS) ×1 IMPLANT
DEVICE RAD COMP TR BAND LRG (VASCULAR PRODUCTS) ×1 IMPLANT
GLIDESHEATH SLEND SS 6F .021 (SHEATH) ×1 IMPLANT
KIT HEART LEFT (KITS) ×2 IMPLANT
PACK CARDIAC CATHETERIZATION (CUSTOM PROCEDURE TRAY) ×2 IMPLANT
TRANSDUCER W/STOPCOCK (MISCELLANEOUS) ×2 IMPLANT
TUBING CIL FLEX 10 FLL-RA (TUBING) ×2 IMPLANT
WIRE SAFE-T 1.5MM-J .035X260CM (WIRE) ×1 IMPLANT

## 2016-01-17 NOTE — Discharge Instructions (Signed)
Radial Site Care °Refer to this sheet in the next few weeks. These instructions provide you with information about caring for yourself after your procedure. Your health care provider may also give you more specific instructions. Your treatment has been planned according to current medical practices, but problems sometimes occur. Call your health care provider if you have any problems or questions after your procedure. °WHAT TO EXPECT AFTER THE PROCEDURE °After your procedure, it is typical to have the following: °· Bruising at the radial site that usually fades within 1-2 weeks. °· Blood collecting in the tissue (hematoma) that may be painful to the touch. It should usually decrease in size and tenderness within 1-2 weeks. °HOME CARE INSTRUCTIONS °· Take medicines only as directed by your health care provider. °· You may shower 24-48 hours after the procedure or as directed by your health care provider. Remove the bandage (dressing) and gently wash the site with plain soap and water. Pat the area dry with a clean towel. Do not rub the site, because this may cause bleeding. °· Do not take baths, swim, or use a hot tub until your health care provider approves. °· Check your insertion site every day for redness, swelling, or drainage. °· Do not apply powder or lotion to the site. °· Do not flex or bend the affected arm for 24 hours or as directed by your health care provider. °· Do not push or pull heavy objects with the affected arm for 24 hours or as directed by your health care provider. °· Do not lift over 10 lb (4.5 kg) for 5 days after your procedure or as directed by your health care provider. °· Ask your health care provider when it is okay to: °¨ Return to work or school. °¨ Resume usual physical activities or sports. °¨ Resume sexual activity. °· Do not drive home if you are discharged the same day as the procedure. Have someone else drive you. °· You may drive 24 hours after the procedure unless otherwise  instructed by your health care provider. °· Do not operate machinery or power tools for 24 hours after the procedure. °· If your procedure was done as an outpatient procedure, which means that you went home the same day as your procedure, a responsible adult should be with you for the first 24 hours after you arrive home. °· Keep all follow-up visits as directed by your health care provider. This is important. °SEEK MEDICAL CARE IF: °· You have a fever. °· You have chills. °· You have increased bleeding from the radial site. Hold pressure on the site. CALL 911 °SEEK IMMEDIATE MEDICAL CARE IF: °· You have unusual pain at the radial site. °· You have redness, warmth, or swelling at the radial site. °· You have drainage (other than a small amount of blood on the dressing) from the radial site. °· The radial site is bleeding, and the bleeding does not stop after 30 minutes of holding steady pressure on the site. °· Your arm or hand becomes pale, cool, tingly, or numb. °  °This information is not intended to replace advice given to you by your health care provider. Make sure you discuss any questions you have with your health care provider. °  °Document Released: 06/15/2010 Document Revised: 06/03/2014 Document Reviewed: 11/29/2013 °Elsevier Interactive Patient Education ©2016 [employer] ° °

## 2016-01-17 NOTE — Interval H&P Note (Signed)
Cath Lab Visit (complete for each Cath Lab visit)  Clinical Evaluation Leading to the Procedure:   ACS: No.  Non-ACS:    Anginal Classification: No Symptoms  Anti-ischemic medical therapy: Minimal Therapy (1 class of medications)  Non-Invasive Test Results: Intermediate-risk stress test findings: cardiac mortality 1-3%/year  Prior CABG: No previous CABG      History and Physical Interval Note:  01/17/2016 1:04 PM  Gregory Perry  has presented today for surgery, with the diagnosis of cardiomyopathy, abnormal stress test  The various methods of treatment have been discussed with the patient and family. After consideration of risks, benefits and other options for treatment, the patient has consented to  Procedure(s): Left Heart Cath and Coronary Angiography (N/A) as a surgical intervention .  The patient's history has been reviewed, patient examined, no change in status, stable for surgery.  I have reviewed the patient's chart and labs.  Questions were answered to the patient's satisfaction.     Sherren Mocha

## 2016-01-17 NOTE — H&P (View-Only) (Signed)
Electrophysiology Office Note Date: 01/05/2016  ID:  Gregory Perry, DOB Oct 05, 1950, MRN [id number]  PCP: Sharon Seller, NP Primary Cardiologist: Johnsie Cancel Electrophysiologist: Lovena Le  CC: to discuss Gregory Perry is a 65 y.o. male seen today for Dr Lovena Le.  He presents today to discuss recent myoview results.  Since last being seen in clinic, the patient reports doing very well.  He continues to have some orthostatic intolerance. He denies chest pain, palpitations, dyspnea, PND, orthopnea, nausea, vomiting, dizziness, syncope, edema, weight gain, or early satiety.  Past Medical History:  Diagnosis Date  . Cardiomyopathy ([place])   . CVA (cerebral infarction)   . Dysphagia   . Headache(784.0)    history of  . Hypertension    Past Surgical History:  Procedure Laterality Date  . BALLOON DILATION N/A 11/22/2015   Procedure: BALLOON DILATION;  Surgeon: Mauri Pole, MD;  Location: [facility];  Service: Endoscopy;  Laterality: N/A;  . DIRECT LARYNGOSCOPY  05/07/2011   Procedure: DIRECT LARYNGOSCOPY;  Surgeon: Rozetta Nunnery, MD;  Location: [facility];  Service: ENT;  Laterality: N/A;  ESOPHAGOSCOPY  WITH DILATION  . EP IMPLANTABLE DEVICE N/A 11/17/2015   Procedure: Loop Recorder Insertion;  Surgeon: Evans Lance, MD;  Location: [facility];  Service: Cardiovascular;  Laterality: N/A;  . ESOPHAGOGASTRODUODENOSCOPY  04/29/2011   Procedure: ESOPHAGOGASTRODUODENOSCOPY (EGD);  Surgeon: Winfield Cunas., MD;  Location: [facility];  Service: Endoscopy;  Laterality: N/A;  . ESOPHAGOGASTRODUODENOSCOPY N/A 11/22/2015   Procedure: ESOPHAGOGASTRODUODENOSCOPY (EGD);  Surgeon: Mauri Pole, MD;  Location[facility];  Service: Endoscopy;  Laterality: N/A;    Current Outpatient Prescriptions  Medication Sig Dispense Refill  . aspirin EC 325 MG tablet Take 1 tablet (325 mg total) by mouth daily. 30 tablet 1  . atorvastatin (LIPITOR) 40 MG tablet Take 1  tablet (40 mg total) by mouth daily at 6 PM. 30 tablet 1  . feeding supplement (BOOST / RESOURCE BREEZE) LIQD Take 1 Container by mouth 3 (three) times daily between meals. 90 Container 0  . fluconazole (DIFLUCAN) 40 MG/ML suspension Take 5 mLs (200 mg total) by mouth daily. 65 mL 0  . lisinopril (PRINIVIL,ZESTRIL) 5 MG tablet Take 1 tablet (5 mg total) by mouth daily. 30 tablet 1  . metoprolol tartrate (LOPRESSOR) 25 MG tablet Take 1 tablet (25 mg total) by mouth 2 (two) times daily. 60 tablet 1  . pantoprazole (PROTONIX) 40 MG tablet Take 1 tablet (40 mg total) by mouth 2 (two) times daily before a meal. 60 tablet 1   No current facility-administered medications for this visit.     Allergies:   Review of patient's allergies indicates no known allergies.   Social History: Social History   Social History  . Marital status: Widowed    Spouse name: N/A  . Number of children: N/A  . Years of education: N/A   Occupational History  . Not on file.   Social History Main Topics  . Smoking status: Current Every Day Smoker    Packs/day: 0.50    Years: 30.00    Types: Cigarettes  . Smokeless tobacco: Never Used  . Alcohol use No     Comment: quit in 10/2015   . Drug use:     Types: Marijuana     Comment: occ  . Sexual activity: Not on file   Other Topics Concern  . Not on file   Social History Narrative  . No  narrative on file   Review of Systems: All other systems reviewed and are otherwise negative except as noted above.   Physical Exam: VS:  BP 140/82   Pulse (!) 55   Ht 5\' 11"  (1.803 m)   Wt 144 lb (65.3 kg)   SpO2 99%   BMI 20.08 kg/m  , BMI Body mass index is 20.08 kg/m. Wt Readings from Last 3 Encounters:  01/05/16 144 lb (65.3 kg)  12/26/15 142 lb (64.4 kg)  12/25/15 151 lb (68.5 kg)    GEN- The patient is elderly and thin appearing, alert and oriented x 3 today.   HEENT: normocephalic, atraumatic; sclera clear, conjunctiva pink; hearing intact; oropharynx  clear; poor dentition Lungs- Clear to ausculation bilaterally, normal work of breathing.  No wheezes, rales, rhonchi Heart- Regular rate and rhythm GI- soft, non-tender, non-distended, bowel sounds present Extremities- no clubbing, cyanosis, or edema; DP/PT/radial pulses 2+ bilaterally MS- no significant deformity or atrophy Skin- warm and dry, no rash or lesion  Psych- euthymic mood, full affect Neuro- strength and sensation are intact   EKG:  EKG is not ordered today.  Recent Labs: 11/22/2015: Magnesium 1.8 12/25/2015: ALT 14; BUN 16; Creat 0.80; Hemoglobin 12.7; Platelets 203; Potassium 4.3; Sodium 143    Other studies Reviewed: Additional studies/ records that were reviewed today include: hospital records  Assessment and Plan: 1. Cryptogenic stroke Continue monitoring for AF with ILR - none to date  Compliance with monitoring encouraged today  2.  Cardiomyopathy Myoview showed findings consistent with prior MI Discussed with Dr Burt Knack.  Because of unknown cause of cardiomyopathy, proceeding with diagnostic catheterization is reasonable. I have discussed options with the patient including continuing medical therapy vs proceeding with LHC. He would like to proceed with LHC.  Risks, benefits reviewed with patient and brother and law today.     3.  Chronic systolic heart failure Euvolemic on exam Continue current therapy He has some orthostatic intolerance and so will hold off on up-titrating medical therapy until after LHC done.    Current medicines are reviewed at length with the patient today.   The patient does not have concerns regarding his medicines.  The following changes were made today:  none  Labs/ tests ordered today include: No orders of the defined types were placed in this encounter.    Disposition:   Follow up with me as scheduled    Signed, Chanetta Marshall, NP 01/05/2016 12:13 PM   [address] [place]  [phone] (office) [phone] (fax)

## 2016-01-17 NOTE — Progress Notes (Signed)
STROKE NEUROLOGY FOLLOW UP NOTE  NAME: Gregory Perry DOB: 1951/05/17  REASON FOR VISIT: stroke follow up HISTORY FROM: Patient and her  Today we had the pleasure of seeing Gregory Perry in follow-up at our Neurology Clinic. Pt was accompanied by no one.   History Summary Gregory Perry is a 65 y.o. male with history of HA and noncompliance admitted on 11/16/2015 for lightheadedness and a fall at home. MRI found acute left PCA infarct and chronic right parietal cortical infarct. MRA showed severe stenosis left PCA and moderate right PCA stenosis. CTA neck no significant stenosis, TTE showed EF 35-40%. TEE not up to be done due to history of esophagus constriction, but droop recorder placed. LDL 136 and A1c 4.9. He had right hemianopia, recommend no driving. He was discharged with aspirin and Lipitor, and recommend cardiology follow-up as outpatient for cardiomyopathy.  Interval History During the interval time, the patient has been doing well. Still has right homonymous hemianopia. BP 125/82. On aspirin and Lipitor. No driving. Had a cardiology follow-up and will do cardiac cath tomorrow with Dr. Burt Knack. Introduced Haematologist trial.  REVIEW OF SYSTEMS: Full 14 system review of systems performed and notable only for those listed below and in HPI above, all others are negative:  Constitutional:   Cardiovascular:  Ear/Nose/Throat:   Skin:  Eyes:   Respiratory:   Gastroitestinal:   Genitourinary:  Hematology/Lymphatic:   Endocrine:  Musculoskeletal:   Allergy/Immunology:   Neurological:   Psychiatric:  Sleep:   The following represents the patient's updated allergies and side effects list: No Known Allergies  The neurologically relevant items on the patient's problem list were reviewed on today's visit.  Neurologic Examination  A problem focused neurological exam (12 or more points of the single system neurologic examination, vital signs counts as 1 point,  cranial nerves count for 8 points) was performed.  Blood pressure 125/82, pulse (!) 53, height 5\' 11"  (1.803 m), weight 148 lb 3.2 oz (67.2 kg).  General - Well nourished, well developed, in no apparent distress.  Ophthalmologic - Fundi not visualized due to noncooperation.  Cardiovascular - Regular rate and rhythm.  Mental Status -  Level of arousal and orientation to time, place, and person were intact. Language including expression, naming, repetition, comprehension was assessed and found intact. Fund of Knowledge was assessed and was intact.  Cranial Nerves II - XII - II - right dense homonymous hemianopia. III, IV, VI - Extraocular movements intact. V - Facial sensation intact bilaterally. VII - Facial movement intact bilaterally. VIII - Hearing & vestibular intact bilaterally. X - Palate elevates symmetrically. XI - Chin turning & shoulder shrug intact bilaterally. XII - Tongue protrusion intact.  Motor Strength - The patient's strength was normal in all extremities and pronator drift was absent.  Bulk was normal and fasciculations were absent.   Motor Tone - Muscle tone was assessed at the neck and appendages and was normal.  Reflexes - The patient's reflexes were 1+ in all extremities and he had no pathological reflexes.  Sensory - Light touch, temperature/pinprick, vibration and proprioception, and Romberg testing were assessed and were normal.    Coordination - The patient had normal movements in the hands and feet with no ataxia or dysmetria.  Tremor was absent.  Gait and Station - The patient's transfers, posture, gait, station, and turns were observed as normal.   Functional score  mRS = 2   0 - No symptoms.   1 -  No significant disability. Able to carry out all usual activities, despite some symptoms.   2 - Slight disability. Able to look after own affairs without assistance, but unable to carry out all previous activities.   3 - Moderate disability.  Requires some help, but able to walk unassisted.   4 - Moderately severe disability. Unable to attend to own bodily needs without assistance, and unable to walk unassisted.   5 - Severe disability. Requires constant nursing care and attention, bedridden, incontinent.   6 - Dead.   NIH Stroke Scale   Level Of Consciousness 0=Alert; keenly responsive 1=Not alert, but arousable by minor stimulation 2=Not alert, requires repeated stimulation 3=Responds only with reflex movements 0  LOC Questions to Month and Age 40=Answers both questions correctly 1=Answers one question correctly 2=Answers neither question correctly 0  LOC Commands      -Open/Close eyes     -Open/close grip 0=Performs both tasks correctly 1=Performs one task correctly 2=Performs neighter task correctly 0  Best Gaze 0=Normal 1=Partial gaze palsy 2=Forced deviation, or total gaze paresis 0  Visual 0=No visual loss 1=Partial hemianopia 2=Complete hemianopia 3=Bilateral hemianopia (blind including cortical blindness) 2  Facial Palsy 0=Normal symmetrical movement 1=Minor paralysis (asymmetry) 2=Partial paralysis (lower face) 3=Complete paralysis (upper and lower face) 0  Motor  0=No drift, limb holds posture for full 10 seconds 1=Drift, limb holds posture, no drift to bed 2=Some antigravity effort, cannot maintain posture, drifts to bed 3=No effort against gravity, limb falls 4=No movement Right Arm 0     Leg 0    Left Arm 0     Leg 0  Limb Ataxia 0=Absent 1=Present in one limb 2=Present in two limbs 0  Sensory 0=Normal 1=Mild to moderate sensory loss 2=Severe to total sensory loss 0  Best Language 0=No aphasia, normal 1=Mild to moderate aphasia 2=Mute, global aphasia 3=Mute, global aphasia 0  Dysarthria 0=Normal 1=Mild to moderate 2=Severe, unintelligible or mute/anarthric 0  Extinction/Neglect 0=No abnormality 1=Extinction to bilateral simultaneous stimulation 2=Profound neglect 1  Total   3      Data reviewed: I personally reviewed the images and agree with the radiology interpretations.  Dg Chest 2 View 11/17/2015  No active cardiopulmonary disease.   Ct Head Wo Contrast 11/17/2015  1. Evolving large acute or subacute infarct involving the left occipital lobe, extending into the posterior aspect of the left temporal lobe. Mild mass effect, without significant midline shift. No definite hemorrhagic transformation is seen, though there is slight irregularity of the left transverse sinus as it passes posterior to the infarct. 2. Chronic infarct at the high right parietal lobe, with associated encephalomalacia. 3. Mild cortical volume loss and scattered small vessel ischemic microangiopathy.   Ct Angio Neck W Or Wo Contrast 11/17/2015   No significant carotid or vertebral artery stenosis in the neck. Minimal atherosclerotic disease at the carotid bifurcation bilaterally.   Mr Brain Wo Contrast 11/17/2015  Acute infarct left PCA territory.  No hemorrhage Extensive chronic ischemic changes. Chronic infarct right parietal cortex. Chronic microvascular ischemic changes throughout the white matter. Chronic infarct left caudate and right thalamus. Slight chronic ischemia left pons.  Mr Jodene Nam Head/brain Wo Cm 11/17/2015  Severe stenosis left posterior cerebral artery. Moderate stenosis right posterior cerebral artery.   TTE - - Left ventricle: The cavity size was mildly dilated. There was  mild concentric hypertrophy. Systolic function was moderately  reduced. The estimated ejection fraction was in the range of 35%  to 40%. Diffuse hypokinesis. Doppler parameters are  consistent  with abnormal left ventricular relaxation (grade 1 diastolic  dysfunction). There was no evidence of elevated ventricular  filling pressure by Doppler parameters. - Aortic valve: There was trivial regurgitation. - Aortic root: The aortic root was normal in size. - Mitral valve: Structurally normal  valve. - Right ventricle: Systolic function was normal. - Right atrium: The atrium was normal in size. - Atrial septum: No defect or patent foramen ovale was identified. - Pulmonary arteries: Systolic pressure was within the normal  range. - Inferior vena cava: The vessel was normal in size. - Pericardium, extracardiac: There was no pericardial effusion. Impressions: - Negative bubble syudy. No evidence for ASD or PFO.  Component     Latest Ref Rng & Units 11/17/2015 12/25/2015  Cholesterol     0 - 200 mg/dL 222 (H)   Triglycerides     <150 mg/dL 66   HDL Cholesterol     >40 mg/dL 73   Total CHOL/HDL Ratio     RATIO 3.0   VLDL     0 - 40 mg/dL 13   LDL (calc)     0 - 99 mg/dL 136 (H)   Hemoglobin A1C     4.8 - 5.6 % 4.9   Mean Plasma Glucose     mg/dL 94   HIV     NONREACTIVE  NONREACTIVE  HCV Ab     NEGATIVE  NEGATIVE    Assessment: As you may recall, he is a 65 y.o. African American male with PMH of HA and noncompliance admitted on 11/16/2015 for acute left PCA infarct. MRI also found to have chronic right parietal cortical infarct. MRA showed severe stenosis left PCA and moderate right PCA stenosis. CTA neck no significant stenosis, TTE showed EF 35-40%. TEE not up to be done due to history of esophagus constriction, but droop recorder placed. LDL 136 and A1c 4.9. He was discharged with aspirin and Lipitor. Had cardiology follow-up as outpatient for cardiomyopathy and will do cardiac cath tomorrow. During the interval time he still has right homonymous hemianopia. Introduced Haematologist trial.  Plan:  - continue ASA and lipitor for stroke prevention for me. - quit smoking - Follow up with your primary care physician for stroke risk factor modification. Recommend maintain blood pressure goal <130/80, diabetes with hemoglobin A1c goal below 7.0% and lipids with LDL cholesterol goal below 70 mg/dL.  - check BP at home  - no driving at this time due to visual field  difficulty - introduce the RESPECT ESUS.  - continue loop recorder monitoring  - will do TCD emboli detection.  - follow up in 4 months  I spent more than 25 minutes of face to face time with the patient. Greater than 50% of time was spent in counseling and coordination of care. We discussed clinical trials, further stroke workup with TCD, and driving privileges.  Orders Placed This Encounter  Procedures  . [place] TCD [facility]    Standing Status:   Future    Standing Expiration Date:   07/19/2016    Order Specific Question:   Reason for Exam (SYMPTOM  OR DIAGNOSIS REQUIRED)    Answer:   embolic stroke    Order Specific Question:   Preferred imaging location?    Answer:   Internal    No orders of the defined types were placed in this encounter.   Patient Instructions  - continue ASA and lipitor for stroke prevention for me. - quit smoking - Follow up with  your primary care physician for stroke risk factor modification. Recommend maintain blood pressure goal <130/80, diabetes with hemoglobin A1c goal below 7.0% and lipids with LDL cholesterol goal below 70 mg/dL.  - check BP at home  - no driving at this time due to visual field difficulty - introduce the clinic study to you and let you think about it.  - continue loop recorder monitoring  - will do TCD emboli detection.  - follow up in 4 months    Rosalin Hawking, MD PhD [facility] [address], [place], [place] [address] [phone]

## 2016-01-17 NOTE — Research (Signed)
[facility] FREE RESEARCH STUDY Informed Consent   Subject Name: Gregory Perry  Subject met inclusion and exclusion criteria.  The informed consent form, study requirements and expectations were reviewed with the subject and questions and concerns were addressed prior to the signing of the consent form.  The subject verbalized understanding of the trial requirements.  The subject agreed to participate in the  trial and signed the informed consent.  The informed consent was obtained prior to performance of any protocol-specific procedures for the subject.  A copy of the signed informed consent was given to the subject and a copy was placed in the subject's medical record.  The subject will be enrolled if angiographic criteria are met.  Blossom Hoops 01/17/2016, 1:54 PM

## 2016-01-18 ENCOUNTER — Encounter (HOSPITAL_COMMUNITY): Payer: Self-pay | Admitting: Cardiovascular Disease

## 2016-01-19 ENCOUNTER — Telehealth: Payer: Self-pay | Admitting: Neurology

## 2016-01-19 NOTE — Telephone Encounter (Signed)
FYI for Dr. Erlinda Hong. See message below.

## 2016-01-19 NOTE — Telephone Encounter (Signed)
Patient called me back and his medicaid is still pending. Patient will call me back when he gets his medicaid card and we will do test then. Thanks Hinton Dyer

## 2016-01-19 NOTE — Telephone Encounter (Signed)
I need to speak to patient about what insurance he has before I can scheduling doppler . Looks like medicaid is still pending. Thanks Hinton Dyer

## 2016-01-25 LAB — CUP PACEART REMOTE DEVICE CHECK: Date Time Interrogation Session: 20170810193631

## 2016-01-25 NOTE — Progress Notes (Signed)
[facility] summary report received. Battery status OK. Normal device function. No new symptom episodes, tachy episodes, brady, or pause episodes. No new AF episodes. Monthly summary reports and ROV/PRN 

## 2016-01-31 ENCOUNTER — Ambulatory Visit: Payer: Self-pay | Admitting: Internal Medicine

## 2016-02-01 ENCOUNTER — Ambulatory Visit: Payer: Self-pay | Admitting: Gastroenterology

## 2016-02-05 ENCOUNTER — Ambulatory Visit (INDEPENDENT_AMBULATORY_CARE_PROVIDER_SITE_OTHER): Payer: Self-pay | Admitting: *Deleted

## 2016-02-05 DIAGNOSIS — I639 Cerebral infarction, unspecified: Secondary | ICD-10-CM

## 2016-02-05 NOTE — Progress Notes (Signed)
Electrophysiology Office Note Date: 02/07/2016  ID:  Gregory Perry, DOB 25-Jan-1951, MRN [id number]  PCP: Sharon Seller, NP Primary Cardiologist: Johnsie Cancel Electrophysiologist: Lovena Le  CC: follow up catheterization  Gregory Perry is a 65 y.o. male seen today for Dr Lovena Le.  He presents today following recent LHC.  Since last being seen in clinic, the patient reports doing very well.  His orthostatic intolerance is resolved. He denies chest pain, palpitations, dyspnea, PND, orthopnea, nausea, vomiting, dizziness, syncope, edema, weight gain, or early satiety.  Past Medical History:  Diagnosis Date  . Alcohol abuse   . Candida esophagitis ([place])   . Cardiomyopathy ([place])   . CVA (cerebral infarction)   . Duodenal ulcer   . Dysphagia   . Headache(784.0)    history of  . Hypertension   . Severe protein-calorie malnutrition ([place])   . Stroke [facility])    Past Surgical History:  Procedure Laterality Date  . BALLOON DILATION N/A 11/22/2015   Procedure: BALLOON DILATION;  Surgeon: Mauri Pole, MD;  Location: [facility];  Service: Endoscopy;  Laterality: N/A;  . CARDIAC CATHETERIZATION N/A 01/17/2016   Procedure: Left Heart Cath and Coronary Angiography;  Surgeon: Sherren Mocha, MD;  Location: [facility] CV LAB;  Service: Cardiovascular;  Laterality: N/A;  . DIRECT LARYNGOSCOPY  05/07/2011   Procedure: DIRECT LARYNGOSCOPY;  Surgeon: Rozetta Nunnery, MD;  Location: [facility];  Service: ENT;  Laterality: N/A;  ESOPHAGOSCOPY  WITH DILATION  . EP IMPLANTABLE DEVICE N/A 11/17/2015   Procedure: Loop Recorder Insertion;  Surgeon: Evans Lance, MD;  Location: [facility] CV LAB;  Service: Cardiovascular;  Laterality: N/A;  . ESOPHAGOGASTRODUODENOSCOPY  04/29/2011   Procedure: ESOPHAGOGASTRODUODENOSCOPY (EGD);  Surgeon: Winfield Cunas., MD;  Location: [facility];  Service: Endoscopy;  Laterality: N/A;  . ESOPHAGOGASTRODUODENOSCOPY N/A 11/22/2015   Procedure:  ESOPHAGOGASTRODUODENOSCOPY (EGD);  Surgeon: Mauri Pole, MD;  Location: [facility] END[facility]COPY;  Service: Endoscopy;  Laterality: N/A;  . LOOP RECORDER IMPLANT      Current Outpatient Prescriptions  Medication Sig Dispense Refill  . aspirin EC 325 MG tablet Take 1 tablet (325 mg total) by mouth daily. 30 tablet 1  . atorvastatin (LIPITOR) 40 MG tablet Take 1 tablet (40 mg total) by mouth daily at 6 PM. 30 tablet 1  . feeding supplement (BOOST / RESOURCE BREEZE) LIQD Take 1 Container by mouth 3 (three) times daily between meals. 90 Container 0  . lisinopril (PRINIVIL,ZESTRIL) 5 MG tablet Take 1 tablet (5 mg total) by mouth daily. 30 tablet 1  . metoprolol tartrate (LOPRESSOR) 25 MG tablet Take 1 tablet (25 mg total) by mouth 2 (two) times daily. 60 tablet 1  . pantoprazole (PROTONIX) 40 MG tablet Take 1 tablet (40 mg total) by mouth 2 (two) times daily before a meal. 60 tablet 1   No current facility-administered medications for this visit.     Allergies:   Review of patient's allergies indicates no known allergies.   Social History: Social History   Social History  . Marital status: Widowed    Spouse name: N/A  . Number of children: N/A  . Years of education: N/A   Occupational History  . Not on file.   Social History Main Topics  . Smoking status: Current Every Day Smoker    Packs/day: 0.50    Years: 30.00    Types: Cigarettes  . Smokeless tobacco: Never Used     Comment: Pt smokes 6 to 7 a day  trying to quit  . Alcohol use No     Comment: quit in 10/2015   . Drug use:     Types: Marijuana     Comment: occ  . Sexual activity: Not on file   Other Topics Concern  . Not on file   Social History Narrative  . No narrative on file   Review of Systems: All other systems reviewed and are otherwise negative except as noted above.   Physical Exam: VS:  BP (!) 142/86   Pulse (!) 56   Ht 5\' 11"  (1.803 m)   Wt 153 lb 3.2 oz (69.5 kg)   SpO2 99%   BMI 21.37 kg/m  , BMI  Body mass index is 21.37 kg/m. Wt Readings from Last 3 Encounters:  02/07/16 153 lb 3.2 oz (69.5 kg)  01/17/16 150 lb (68 kg)  01/16/16 148 lb 3.2 oz (67.2 kg)    GEN- The patient is elderly and thin appearing, alert and oriented x 3 today.   HEENT: normocephalic, atraumatic; sclera clear, conjunctiva pink; hearing intact; oropharynx clear; poor dentition Lungs- Clear to ausculation bilaterally, normal work of breathing.  No wheezes, rales, rhonchi Heart- Regular rate and rhythm GI- soft, non-tender, non-distended, bowel sounds present Extremities- no clubbing, cyanosis, or edema; DP/PT/radial pulses 2+ bilaterally MS- no significant deformity or atrophy Skin- warm and dry, no rash or lesion  Psych- euthymic mood, full affect Neuro- strength and sensation are intact   EKG:  EKG is not ordered today.  Recent Labs: 11/22/2015: Magnesium 1.8 01/17/2016: ALT 27; BUN 10; Creatinine, Ser 0.91; Hemoglobin 12.2; Platelets 258; Potassium 3.5; Sodium 140    Other studies Reviewed: Additional studies/ records that were reviewed today include: hospital records  Assessment and Plan: 1. Cryptogenic stroke Continue monitoring for AF with ILR - none to date  Compliance with monitoring encouraged today  2.  Cardiomyopathy LHC 12/2015 demonstrated mild to moderate diffuse non obstructive CAD with exception of tight stenosis in RV marginal branch.  Secondary risk reduction was recommended.     3.  Chronic systolic heart failure Euvolemic on exam Continue current therapy Update echo in 3 months. If EF is <35% at that time, would qualify for ICD for primary prevention.   4.  HTN Increase Lisinopril today BMET   Current medicines are reviewed at length with the patient today.   The patient does not have concerns regarding his medicines.  The following changes were made today:  none  Labs/ tests ordered today include: echo in 04/2016, BMET No orders of the defined types were placed in this  encounter.    Disposition:   Follow up with [facility], Dr Lovena Le 4 months    Signed, Chanetta Marshall, NP 02/07/2016 9:44 AM   [place] [address] [phone] (office) [phone] (fax)

## 2016-02-06 NOTE — Progress Notes (Signed)
Carelink Summary Report / Loop Recorder 

## 2016-02-07 ENCOUNTER — Encounter: Payer: Self-pay | Admitting: Nurse Practitioner

## 2016-02-07 ENCOUNTER — Ambulatory Visit (INDEPENDENT_AMBULATORY_CARE_PROVIDER_SITE_OTHER): Payer: Self-pay | Admitting: Nurse Practitioner

## 2016-02-07 VITALS — BP 142/86 | HR 56 | Ht 71.0 in | Wt 153.2 lb

## 2016-02-07 DIAGNOSIS — I428 Other cardiomyopathies: Secondary | ICD-10-CM

## 2016-02-07 DIAGNOSIS — I5022 Chronic systolic (congestive) heart failure: Secondary | ICD-10-CM

## 2016-02-07 DIAGNOSIS — I159 Secondary hypertension, unspecified: Secondary | ICD-10-CM

## 2016-02-07 DIAGNOSIS — I429 Cardiomyopathy, unspecified: Secondary | ICD-10-CM

## 2016-02-07 DIAGNOSIS — I639 Cerebral infarction, unspecified: Secondary | ICD-10-CM

## 2016-02-07 LAB — BASIC METABOLIC PANEL
BUN: 9 mg/dL (ref 7–25)
CHLORIDE: 107 mmol/L (ref 98–110)
CO2: 24 mmol/L (ref 20–31)
CREATININE: 0.83 mg/dL (ref 0.70–1.25)
Calcium: 9 mg/dL (ref 8.6–10.3)
GLUCOSE: 71 mg/dL (ref 65–99)
Potassium: 3.6 mmol/L (ref 3.5–5.3)
Sodium: 141 mmol/L (ref 135–146)

## 2016-02-07 MED ORDER — LISINOPRIL 10 MG PO TABS: 10.0000 mg | ORAL_TABLET | Freq: Every day | ORAL | 6 refills | Status: DC

## 2016-02-07 NOTE — Patient Instructions (Signed)
Medication Instructions:   START TAKING LISNOPRIL 10 MG  ONCE A DAY    If you need a refill on your cardiac medications before your next appointment, please call your pharmacy.  Labwork: BMET    Testing/Procedures: IN 3 MONTHS Your physician has requested that you have an echocardiogram. Echocardiography is a painless test that uses sound waves to create images of your heart. It provides your doctor with information about the size and shape of your heart and how well your heart's chambers and valves are working. This procedure takes approximately one hour. There are no restrictions for this procedure.   Follow-Up: .Lovena Le IN 4 MONTHS   Any Other Special Instructions Will Be Listed Below (If Applicable).

## 2016-02-08 ENCOUNTER — Telehealth: Payer: Self-pay | Admitting: *Deleted

## 2016-02-08 NOTE — Telephone Encounter (Signed)
LMOVM TO CALL BACK FOR RESULTS 

## 2016-02-08 NOTE — Telephone Encounter (Signed)
-----   Message from Patsey Berthold, NP sent at 02/08/2016  7:20 AM EDT ----- Please notify patient of stable labs

## 2016-03-02 LAB — CUP PACEART REMOTE DEVICE CHECK: MDC IDC SESS DTM: 20170909200755

## 2016-03-02 NOTE — Progress Notes (Signed)
[facility] summary report received. Battery status OK. Normal device function. No new symptom episodes, tachy episodes, brady, or pause episodes. No new AF episodes. Monthly summary reports and ROV/PRN 

## 2016-03-04 ENCOUNTER — Ambulatory Visit (INDEPENDENT_AMBULATORY_CARE_PROVIDER_SITE_OTHER): Payer: Self-pay | Admitting: *Deleted

## 2016-03-04 DIAGNOSIS — I639 Cerebral infarction, unspecified: Secondary | ICD-10-CM

## 2016-03-05 NOTE — Progress Notes (Signed)
Carelink Summary Report / Loop Recorder 

## 2016-03-20 ENCOUNTER — Telehealth: Payer: Self-pay

## 2016-03-20 MED ORDER — ATORVASTATIN CALCIUM 40 MG PO TABS: 40.0000 mg | ORAL_TABLET | Freq: Every day | ORAL | 1 refills | Status: DC

## 2016-03-20 NOTE — Telephone Encounter (Signed)
This has been sent into pharmacy. Thanks!  

## 2016-03-21 ENCOUNTER — Telehealth: Payer: Self-pay

## 2016-03-22 MED ORDER — METOPROLOL TARTRATE 25 MG PO TABS: 25.0000 mg | ORAL_TABLET | Freq: Two times a day (BID) | ORAL | 1 refills | Status: DC

## 2016-03-22 NOTE — Telephone Encounter (Signed)
This has been refilled.  Thanks. 

## 2016-03-26 ENCOUNTER — Ambulatory Visit: Payer: Self-pay | Admitting: Family Medicine

## 2016-04-01 ENCOUNTER — Ambulatory Visit: Payer: Self-pay | Admitting: Internal Medicine

## 2016-04-03 ENCOUNTER — Telehealth: Payer: Self-pay | Admitting: *Deleted

## 2016-04-03 ENCOUNTER — Ambulatory Visit (INDEPENDENT_AMBULATORY_CARE_PROVIDER_SITE_OTHER): Payer: Self-pay | Admitting: *Deleted

## 2016-04-03 DIAGNOSIS — I639 Cerebral infarction, unspecified: Secondary | ICD-10-CM

## 2016-04-03 NOTE — Telephone Encounter (Signed)
No show letter mailed to patient. 

## 2016-04-04 NOTE — Progress Notes (Signed)
Carelink Summary Report / Loop Recorder 

## 2016-04-07 LAB — CUP PACEART REMOTE DEVICE CHECK
Date Time Interrogation Session: 20171009204247
MDC IDC PG IMPLANT DT: 20170623

## 2016-04-07 NOTE — Progress Notes (Signed)
[facility] summary report received. Battery status OK. Normal device function. No new symptom episodes, tachy episodes, brady, or pause episodes. No new AF episodes. Monthly summary reports and ROV/PRN 

## 2016-04-25 ENCOUNTER — Ambulatory Visit: Payer: Self-pay | Admitting: Family Medicine

## 2016-05-03 ENCOUNTER — Ambulatory Visit (INDEPENDENT_AMBULATORY_CARE_PROVIDER_SITE_OTHER): Payer: Self-pay | Admitting: *Deleted

## 2016-05-03 DIAGNOSIS — I639 Cerebral infarction, unspecified: Secondary | ICD-10-CM

## 2016-05-06 NOTE — Progress Notes (Signed)
Carelink Summary Report / Loop Recorder 

## 2016-05-08 ENCOUNTER — Other Ambulatory Visit (HOSPITAL_COMMUNITY): Payer: Self-pay

## 2016-05-18 LAB — CUP PACEART REMOTE DEVICE CHECK
Implantable Pulse Generator Implant Date: 20170623
MDC IDC SESS DTM: 20171108213846

## 2016-05-18 NOTE — Progress Notes (Signed)
[facility] summary report received. Battery status OK. Normal device function. No new symptom episodes, tachy episodes, brady, or pause episodes. No new AF episodes. Monthly summary reports and ROV/PRN 

## 2016-05-23 ENCOUNTER — Other Ambulatory Visit: Payer: Self-pay | Admitting: Family Medicine

## 2016-06-03 ENCOUNTER — Ambulatory Visit: Payer: MEDICAID | Admitting: Neurology

## 2016-06-03 ENCOUNTER — Ambulatory Visit (INDEPENDENT_AMBULATORY_CARE_PROVIDER_SITE_OTHER): Payer: Self-pay | Admitting: *Deleted

## 2016-06-03 DIAGNOSIS — I639 Cerebral infarction, unspecified: Secondary | ICD-10-CM

## 2016-06-04 ENCOUNTER — Encounter: Payer: Self-pay | Admitting: Neurology

## 2016-06-04 NOTE — Progress Notes (Signed)
Carelink Summary Report / Loop Recorder 

## 2016-06-05 ENCOUNTER — Other Ambulatory Visit: Payer: Self-pay | Admitting: Family Medicine

## 2016-06-19 ENCOUNTER — Telehealth (HOSPITAL_COMMUNITY): Payer: Self-pay | Admitting: Radiology

## 2016-06-19 NOTE — Telephone Encounter (Signed)
Called to reschedule echocardiogram 

## 2016-06-22 LAB — CUP PACEART REMOTE DEVICE CHECK
MDC IDC PG IMPLANT DT: 20170623
MDC IDC SESS DTM: 20171208213933

## 2016-06-22 NOTE — Progress Notes (Signed)
[facility] summary report received. Battery status OK. Normal device function. No new symptom episodes, tachy episodes, brady, or pause episodes. No new AF episodes. Monthly summary reports and ROV/PRN 

## 2016-06-27 ENCOUNTER — Telehealth (HOSPITAL_COMMUNITY): Payer: Self-pay | Admitting: Radiology

## 2016-06-27 NOTE — Telephone Encounter (Signed)
LMOM to call office to schedule an appointment

## 2016-07-02 ENCOUNTER — Ambulatory Visit (INDEPENDENT_AMBULATORY_CARE_PROVIDER_SITE_OTHER): Payer: Self-pay | Admitting: *Deleted

## 2016-07-02 DIAGNOSIS — I639 Cerebral infarction, unspecified: Secondary | ICD-10-CM

## 2016-07-03 NOTE — Progress Notes (Signed)
Carelink Summary Report / Loop Recorder 

## 2016-07-04 ENCOUNTER — Encounter (HOSPITAL_COMMUNITY): Payer: Self-pay | Admitting: Radiology

## 2016-07-16 LAB — CUP PACEART REMOTE DEVICE CHECK
Date Time Interrogation Session: 20180107224033
Implantable Pulse Generator Implant Date: 20170623

## 2016-07-25 LAB — CUP PACEART REMOTE DEVICE CHECK
MDC IDC PG IMPLANT DT: 20170623
MDC IDC SESS DTM: 20180206224310

## 2016-07-25 NOTE — Progress Notes (Signed)
[facility] summary report received. Battery status OK. Normal device function. No new symptom episodes, tachy episodes, brady, or pause episodes. 1 AF 0%- ECG appears SR w/ PAC's PVC's. Monthly summary reports and ROV/PRN

## 2016-07-30 ENCOUNTER — Other Ambulatory Visit: Payer: Self-pay | Admitting: Family Medicine

## 2016-08-01 ENCOUNTER — Ambulatory Visit (INDEPENDENT_AMBULATORY_CARE_PROVIDER_SITE_OTHER): Payer: Self-pay | Admitting: *Deleted

## 2016-08-01 DIAGNOSIS — I639 Cerebral infarction, unspecified: Secondary | ICD-10-CM

## 2016-08-02 NOTE — Progress Notes (Signed)
Carelink Summary Report / Loop Recorder 

## 2016-08-13 ENCOUNTER — Other Ambulatory Visit: Payer: Self-pay | Admitting: Internal Medicine

## 2016-08-15 LAB — CUP PACEART REMOTE DEVICE CHECK
Date Time Interrogation Session: 20180308230722
MDC IDC PG IMPLANT DT: 20170623

## 2016-08-22 ENCOUNTER — Telehealth (HOSPITAL_COMMUNITY): Payer: Self-pay | Admitting: Nurse Practitioner

## 2016-08-22 NOTE — Telephone Encounter (Signed)
Pt no-showed appt on 05/08/16 and pt was contacted on 06/19/16 and 06/27/16, no response has been received so he will be removed from the workqueue.

## 2016-08-27 ENCOUNTER — Other Ambulatory Visit: Payer: Self-pay | Admitting: Nurse Practitioner

## 2016-09-02 ENCOUNTER — Ambulatory Visit (INDEPENDENT_AMBULATORY_CARE_PROVIDER_SITE_OTHER): Payer: Self-pay | Admitting: *Deleted

## 2016-09-02 DIAGNOSIS — I639 Cerebral infarction, unspecified: Secondary | ICD-10-CM

## 2016-09-03 NOTE — Progress Notes (Signed)
Carelink Summary Report / Loop Recorder 

## 2016-09-08 LAB — CUP PACEART REMOTE DEVICE CHECK
Date Time Interrogation Session: 20180407234209
Implantable Pulse Generator Implant Date: 20170623

## 2016-09-08 NOTE — Progress Notes (Signed)
[facility] summary report received. Battery status OK. Normal device function. No new symptom episodes, brady, or pause episodes. 1 tachy- ECG appears SVT w/ PVC. 4 AF 0%- available ECGs appear SR w/ PVCs/PACs. Monthly summary reports and ROV/PRN

## 2016-09-12 ENCOUNTER — Other Ambulatory Visit: Payer: Self-pay | Admitting: Internal Medicine

## 2016-09-13 ENCOUNTER — Ambulatory Visit (INDEPENDENT_AMBULATORY_CARE_PROVIDER_SITE_OTHER): Payer: Self-pay | Admitting: Family Medicine

## 2016-09-13 ENCOUNTER — Encounter: Payer: Self-pay | Admitting: Family Medicine

## 2016-09-13 VITALS — BP 160/102 | HR 60 | Temp 97.4°F | Resp 16 | Ht 71.0 in | Wt 175.0 lb

## 2016-09-13 DIAGNOSIS — I10 Essential (primary) hypertension: Secondary | ICD-10-CM

## 2016-09-13 DIAGNOSIS — I639 Cerebral infarction, unspecified: Secondary | ICD-10-CM

## 2016-09-13 DIAGNOSIS — E785 Hyperlipidemia, unspecified: Secondary | ICD-10-CM

## 2016-09-13 DIAGNOSIS — E784 Other hyperlipidemia: Secondary | ICD-10-CM

## 2016-09-13 DIAGNOSIS — I429 Cardiomyopathy, unspecified: Secondary | ICD-10-CM

## 2016-09-13 LAB — CBC WITH DIFFERENTIAL/PLATELET
Basophils Absolute: 0 cells/uL (ref 0–200)
Basophils Relative: 0 %
Eosinophils Absolute: 208 cells/uL (ref 15–500)
Eosinophils Relative: 4 %
HEMATOCRIT: 40.7 % (ref 38.5–50.0)
HEMOGLOBIN: 13.2 g/dL (ref 13.2–17.1)
LYMPHS PCT: 53 %
Lymphs Abs: 2756 cells/uL (ref 850–3900)
MCH: 30.6 pg (ref 27.0–33.0)
MCHC: 32.4 g/dL (ref 32.0–36.0)
MCV: 94.2 fL (ref 80.0–100.0)
MONO ABS: 416 {cells}/uL (ref 200–950)
MPV: 9 fL (ref 7.5–12.5)
Monocytes Relative: 8 %
NEUTROS PCT: 35 %
Neutro Abs: 1820 cells/uL (ref 1500–7800)
Platelets: 256 10*3/uL (ref 140–400)
RBC: 4.32 MIL/uL (ref 4.20–5.80)
RDW: 14.3 % (ref 11.0–15.0)
WBC: 5.2 10*3/uL (ref 3.8–10.8)

## 2016-09-13 LAB — COMPLETE METABOLIC PANEL WITH GFR
ALBUMIN: 3.8 g/dL (ref 3.6–5.1)
ALT: 26 U/L (ref 9–46)
AST: 34 U/L (ref 10–35)
Alkaline Phosphatase: 62 U/L (ref 40–115)
BILIRUBIN TOTAL: 0.3 mg/dL (ref 0.2–1.2)
BUN: 13 mg/dL (ref 7–25)
CALCIUM: 9.5 mg/dL (ref 8.6–10.3)
CHLORIDE: 106 mmol/L (ref 98–110)
CO2: 29 mmol/L (ref 20–31)
CREATININE: 1.05 mg/dL (ref 0.70–1.25)
GFR, Est African American: 87 mL/min (ref >=60)
GFR, Est Non African American: 75 mL/min (ref >=60)
Glucose, Bld: 84 mg/dL (ref 65–99)
Potassium: 3.7 mmol/L (ref 3.5–5.3)
Sodium: 141 mmol/L (ref 135–146)
TOTAL PROTEIN: 7.3 g/dL (ref 6.1–8.1)

## 2016-09-13 LAB — LIPID PANEL
CHOLESTEROL: 204 mg/dL — AB (ref <200)
HDL: 55 mg/dL (ref >40)
LDL Cholesterol: 133 mg/dL — ABNORMAL HIGH (ref <100)
TRIGLYCERIDES: 80 mg/dL (ref <150)
Total CHOL/HDL Ratio: 3.7 Ratio (ref <5.0)
VLDL: 16 mg/dL (ref <30)

## 2016-09-13 LAB — POCT URINALYSIS DIP (DEVICE)
BILIRUBIN URINE: NEGATIVE
GLUCOSE, UA: NEGATIVE mg/dL
Hgb urine dipstick: NEGATIVE
KETONES UR: NEGATIVE mg/dL
Leukocytes, UA: NEGATIVE
Nitrite: NEGATIVE
PROTEIN: NEGATIVE mg/dL
SPECIFIC GRAVITY, URINE: 1.025 (ref 1.005–1.030)
Urobilinogen, UA: 0.2 mg/dL (ref 0.0–1.0)
pH: 5.5 (ref 5.0–8.0)

## 2016-09-13 MED ORDER — BLOOD PRESSURE CUFF MISC: 0 refills | Status: DC

## 2016-09-13 MED ORDER — AMLODIPINE BESYLATE 2.5 MG PO TABS: 2.5000 mg | ORAL_TABLET | Freq: Every day | ORAL | 0 refills | Status: DC

## 2016-09-13 NOTE — Progress Notes (Signed)
Patient ID: Gregory Perry, male    DOB: March 01, 1951, 66 y.o.   MRN: [id number]  PCP: Gregory Barrows, FNP  Chief Complaint  Patient presents with  . Follow-up    BLOOD PRESSURE    Subjective:  HPI  Gregory Perry is a 66 y.o. male presents for hypertension follow-up.  Past medical problems include: Cardiomyopathy w/pacemaker, Essential Hypertension, Hx of CVA, and  GERD. It has been an extended period of time since Gregory Perry has been seen here at [facility].  He has a history of cardiomyopathy which required pacemaker placement 11/17/2015. He presents today for evaluation of his blood pressure. Reports no home monitoring.  He hasn't had any recent follow-up with a cardiologist except for monitoring and interrogation of his pacemaker. Gregory Perry denies any episodes of dizziness, chest pain, or headaches. He lives with his sister and feels that she would be able to check his blood pressure for him. Reports some visual impairment and needs to be evaluated by a opthalmology. He is uninsured and reports that he has  applied for medicaid and disability for which he reports being denied.   Social History   Social History  . Marital status: Widowed    Spouse name: N/A  . Number of children: N/A  . Years of education: N/A   Occupational History  . Not on file.   Social History Main Topics  . Smoking status: Current Every Day Smoker    Packs/day: 0.50    Years: 30.00    Types: Cigarettes  . Smokeless tobacco: Never Used     Comment: Pt smokes 6 to 7 a day trying to quit  . Alcohol use No     Comment: quit in 10/2015   . Drug use: Yes    Types: Marijuana     Comment: occ  . Sexual activity: Not on file   Other Topics Concern  . Not on file   Social History Narrative  . No narrative on file    Family History  Problem Relation Age of Onset  . Heart attack Maternal Grandfather    Review of Systems See HPI  Patient Active Problem List   Diagnosis Date Noted  .  Abnormal nuclear stress test 01/17/2016  . Candida esophagitis ([place])   . Pre-operative cardiovascular examination, LVEF < 35% 11/20/2015  . Cryptogenic stroke ([place]) 11/17/2015  . Essential hypertension 11/17/2015  . GERD (gastroesophageal reflux disease) 11/17/2015  . Loss of weight 11/17/2015  . Tobacco abuse 11/17/2015  . Dysphagia 11/17/2015  . Protein-calorie malnutrition, severe 11/17/2015  . Cerebral infarction due to thrombosis of right posterior cerebral artery ([place])   . History of stroke   . Cardiomyopathy, ischemic   . Esophageal stricture   . Dysphasia 05/07/2011    No Known Allergies  Prior to Admission medications   Medication Sig Start Date End Date Taking? Authorizing Provider  aspirin EC 325 MG tablet Take 1 tablet (325 mg total) by mouth daily. 11/23/15  Yes Modena Jansky, MD  lisinopril (PRINIVIL,ZESTRIL) 10 MG tablet take 1 tablet by mouth once daily 08/28/16  Yes Amber Sena Slate, NP  metoprolol tartrate (LOPRESSOR) 25 MG tablet take 1 tablet by mouth twice a day 07/30/16  Yes Micheline Chapman, NP  atorvastatin (LIPITOR) 40 MG tablet take 1 tablet by mouth once daily AT 6 PM Patient not taking: Reported on 09/13/2016 07/30/16   Micheline Chapman, NP  feeding supplement (BOOST / RESOURCE BREEZE) LIQD Take 1 Container by mouth  3 (three) times daily between meals. Patient not taking: Reported on 09/13/2016 11/20/15   Orson Eva, MD  pantoprazole (PROTONIX) 40 MG tablet Take 1 tablet (40 mg total) by mouth 2 (two) times daily before a meal. Patient not taking: Reported on 09/13/2016 11/23/15   Modena Jansky, MD    Past Medical, Surgical Family and Social History reviewed and updated.    Objective:   Wt Readings from Last 3 Encounters:  09/13/16 175 lb (79.4 kg)  02/07/16 153 lb 3.2 oz (69.5 kg)  01/17/16 150 lb (68 kg)   Physical Exam  Constitutional: He is oriented to person, place, and time. He appears well-developed.  HENT:  Head: Normocephalic and atraumatic.   Eyes: Conjunctivae and EOM are normal. Pupils are equal, round, and reactive to light. No scleral icterus.  Arcus senilis bilateral eyes  Neck: Normal range of motion. Neck supple. No thyromegaly present.  Cardiovascular: Normal rate, regular rhythm, normal heart sounds and intact distal pulses.   No murmur heard. Pulmonary/Chest: Effort normal. He has decreased breath sounds in the right upper field, the right middle field, the right lower field, the left upper field, the left middle field and the left lower field.  Abdominal: Soft. Bowel sounds are normal.  Musculoskeletal: Normal range of motion.  Neurological: He is alert and oriented to person, place, and time.  Skin: Skin is warm and dry. No rash noted. No erythema. No pallor.  Psychiatric: He has a normal mood and affect. His behavior is normal. Judgment and thought content normal.      Assessment & Plan:  1. Hypertension, Uncontrolled  - Thyroid Panel With TSH - Lipid panel  2. Cardiomyopathy, unspecified type ([place]) - COMPLETE METABOLIC PANEL WITH GFR - CBC with Differential/Platelet - Hemoglobin A1c - Ambulatory referral to Cardiology  3. Cerebrovascular accident (CVA), unspecified mechanism ([place]) - Ambulatory referral to Neurology   Gregory Perry has not been seen here in office since July 2017. He suffered a CVA in 11/16/2015 and also had a pacemaker with loop recorder inserted due to EF 35 -40%.  Since admission in 11/16/2015 he had been followed by cardiology nurse for monitoring of his pacemaker and has seen neurology once. He has been noncompliant with follow-up visits and today presents with uncontrolled hypertension. He has a sister at home and reports that she can assist him with checking his blood pressure as his vision is poor. Im adding amlodipine 2.5 mg daily in hopes of improving blood pressure. Referring him back to cardiology and neurology for follow-up on chronic heart disease and cerebral vascular  disease. Reinforced the importance of completing the patient assistance application.  RTC: 2 weeks for blood pressure follow-up. Bring blood pressure readings with you to next visit.    Gregory Sage. [name], MSN, [facility] [address] [place], [place] [address] [phone]

## 2016-09-13 NOTE — Patient Instructions (Signed)
Please have your sister check your blood pressure daily  Between the hours of 8 pm - 9 pm.   Keep a log of blood pressure readings. I am adding Amlodipine 2.5 mg once daily.  Continue to take all medications as prescribed.  I am referring you to neurology and cardiology.  Please complete the form for patient assistance.    Hypertension Hypertension is another name for high blood pressure. High blood pressure forces your heart to work harder to pump blood. This can cause problems over time. There are two numbers in a blood pressure reading. There is a top number (systolic) over a bottom number (diastolic). It is best to have a blood pressure below 120/80. Healthy choices can help lower your blood pressure. You may need medicine to help lower your blood pressure if:  Your blood pressure cannot be lowered with healthy choices.  Your blood pressure is higher than 130/80. Follow these instructions at home: Eating and drinking   If directed, follow the DASH eating plan. This diet includes:  Filling half of your plate at each meal with fruits and vegetables.  Filling one quarter of your plate at each meal with whole grains. Whole grains include whole wheat pasta, brown rice, and whole grain bread.  Eating or drinking low-fat dairy products, such as skim milk or low-fat yogurt.  Filling one quarter of your plate at each meal with low-fat (lean) proteins. Low-fat proteins include fish, skinless chicken, eggs, beans, and tofu.  Avoiding fatty meat, cured and processed meat, or chicken with skin.  Avoiding premade or processed food.  Eat less than 1,500 mg of salt (sodium) a day.  Limit alcohol use to no more than 1 drink a day for nonpregnant women and 2 drinks a day for men. One drink equals 12 oz of beer, 5 oz of wine, or 1 oz of hard liquor. Lifestyle   Work with your doctor to stay at a healthy weight or to lose weight. Ask your doctor what the best weight is for you.  Get at  least 30 minutes of exercise that causes your heart to beat faster (aerobic exercise) most days of the week. This may include walking, swimming, or biking.  Get at least 30 minutes of exercise that strengthens your muscles (resistance exercise) at least 3 days a week. This may include lifting weights or pilates.  Do not use any products that contain nicotine or tobacco. This includes cigarettes and e-cigarettes. If you need help quitting, ask your doctor.  Check your blood pressure at home as told by your doctor.  Keep all follow-up visits as told by your doctor. This is important. Medicines   Take over-the-counter and prescription medicines only as told by your doctor. Follow directions carefully.  Do not skip doses of blood pressure medicine. The medicine does not work as well if you skip doses. Skipping doses also puts you at risk for problems.  Ask your doctor about side effects or reactions to medicines that you should watch for. Contact a doctor if:  You think you are having a reaction to the medicine you are taking.  You have headaches that keep coming back (recurring).  You feel dizzy.  You have swelling in your ankles.  You have trouble with your vision. Get help right away if:  You get a very bad headache.  You start to feel confused.  You feel weak or numb.  You feel faint.  You get very bad pain in your:  Chest.  Belly (abdomen).  You throw up (vomit) more than once.  You have trouble breathing. Summary  Hypertension is another name for high blood pressure.  Making healthy choices can help lower blood pressure. If your blood pressure cannot be controlled with healthy choices, you may need to take medicine. This information is not intended to replace advice given to you by your health care provider. Make sure you discuss any questions you have with your health care provider. Document Released: 10/30/2007 Document Revised: 04/10/2016 Document Reviewed:  04/10/2016 Elsevier Interactive Patient Education  2017 [employer].

## 2016-09-14 LAB — HEMOGLOBIN A1C
Hgb A1c MFr Bld: 5 % (ref <5.7)
MEAN PLASMA GLUCOSE: 97 mg/dL

## 2016-09-14 LAB — THYROID PANEL WITH TSH
FREE THYROXINE INDEX: 2.1 (ref 1.4–3.8)
T3 UPTAKE: 29 % (ref 22–35)
T4, Total: 7.4 ug/dL (ref 4.5–12.0)
TSH: 0.51 m[IU]/L (ref 0.40–4.50)

## 2016-09-15 MED ORDER — ATORVASTATIN CALCIUM 40 MG PO TABS: 40.0000 mg | ORAL_TABLET | Freq: Every day | ORAL | 2 refills | Status: DC

## 2016-09-17 ENCOUNTER — Telehealth: Payer: Self-pay | Admitting: Internal Medicine

## 2016-09-17 MED ORDER — LISINOPRIL 10 MG PO TABS
10.0000 mg | ORAL_TABLET | Freq: Every day | ORAL | 0 refills | Status: DC
Start: 2016-09-17 — End: 2016-09-27

## 2016-09-17 NOTE — Telephone Encounter (Signed)
Pt needs refill of Lisinopril 10mg  to [facility] made for 10-15-16 at 1130.am with Joseph Art

## 2016-09-22 NOTE — Progress Notes (Deleted)
Cardiology Office Note   Date:  09/22/2016   ID:  Gregory Perry, DOB 14-Aug-1950, MRN [id number]  PCP:  Molli Barrows, FNP  Cardiologist:   Minus Breeding, MD  Referring:  ***  No chief complaint on file.     History of Present Illness: Gregory Perry is a 66 y.o. male who presents for evaluation of nonischemic cardiomyopathy.  He had an acute CVA.  This was felt to be embolic with unknown source.  He did have a cardiomyopathy.  This was evaluated with an out patient Carlton Adam Myoview that suggested prior MI.  However, follow up cath did not demonstrate obstructive CAD. Marland Kitchen  He had a Linq placed.  I have reviewed hospital an doffice records as he is new to me.  He has had no atrial fib on his implanted loop to date.   ***   Of note he did have an esophageal stricture evaluated on that admission. He had candida infectoin.    66 year old male with a history of hypertension, tobacco abuse, alcohol abuse presents with one-day history of gait instability and fall. Upon further questioning, the patient did not have loss of consciousness. However, the patient stated that he "did not feel right"the day prior to admission. He denied any dysarthria, focal collection may weakness, loss of vision. Initial CT of the brain revealed evolving acute or subacute infarct in the left occipital lobe. MRI revealed a left PCA infarct. As result, neurology was consulted and full stroke workup was undertaken. In addition, the patient has been complaining of dysphagia and odynophagia for the last 2-3 years. He has had intermittent vomiting because he is not able to swallow food. In addition he has lost 30 pounds in the past 6-12 months. On 04/29/2011, the patient had an EGD performed by Dr. Oletta Lamas which revealed a stricture in the proximal esophagus with a bloody ulcer. There was difficulty advancing the scope at that time which required ENT to assist.GI was consulted.    Past Medical History:  Diagnosis  Date  . Alcohol abuse   . Candida esophagitis ([place])   . Cardiomyopathy ([place])   . CVA (cerebral infarction)   . Duodenal ulcer   . Dysphagia   . Headache(784.0)    history of  . Hypertension   . Severe protein-calorie malnutrition ([place])   . Stroke [facility])     Past Surgical History:  Procedure Laterality Date  . BALLOON DILATION N/A 11/22/2015   Procedure: BALLOON DILATION;  Surgeon: Mauri Pole, MD;  Location: [facility];  Service: Endoscopy;  Laterality: N/A;  . CARDIAC CATHETERIZATION N/A 01/17/2016   Procedure: Left Heart Cath and Coronary Angiography;  Surgeon: Sherren Mocha, MD;  Location: [facility];  Service: Cardiovascular;  Laterality: N/A;  . DIRECT LARYNGOSCOPY  05/07/2011   Procedure: DIRECT LARYNGOSCOPY;  Surgeon: Rozetta Nunnery, MD;  Location: [facility];  Service: ENT;  Laterality: N/A;  ESOPHAGOSCOPY  WITH DILATION  . EP IMPLANTABLE DEVICE N/A 11/17/2015   Procedure: Loop Recorder Insertion;  Surgeon: Evans Lance, MD;  Location: [facility] CV LAB;  Service: Cardiovascular;  Laterality: N/A;  . ESOPHAGOGASTRODUODENOSCOPY  04/29/2011   Procedure: ESOPHAGOGASTRODUODENOSCOPY (EGD);  Surgeon: Winfield Cunas., MD;  Location: [facility];  Service: Endoscopy;  Laterality: N/A;  . ESOPHAGOGASTRODUODENOSCOPY N/A 11/22/2015   Procedure: ESOPHAGOGASTRODUODENOSCOPY (EGD);  Surgeon: Mauri Pole, MD;  Location: [facility] ENDOSCOPY;  Service: Endoscopy;  Laterality: N/A;  . LOOP RECORDER IMPLANT  Current Outpatient Prescriptions  Medication Sig Dispense Refill  . amLODipine (NORVASC) 2.5 MG tablet Take 1 tablet (2.5 mg total) by mouth daily. 30 tablet 0  . aspirin EC 325 MG tablet Take 1 tablet (325 mg total) by mouth daily. 30 tablet 1  . atorvastatin (LIPITOR) 40 MG tablet Take 1 tablet (40 mg total) by mouth daily at 6 PM. 90 tablet 2  . Blood Pressure Monitoring (BLOOD PRESSURE CUFF) MISC Check blood pressure daily between 8 pm -9 pm 1 each 0  .  feeding supplement (BOOST / RESOURCE BREEZE) LIQD Take 1 Container by mouth 3 (three) times daily between meals. (Patient not taking: Reported on 09/13/2016) 90 Container 0  . lisinopril (PRINIVIL,ZESTRIL) 10 MG tablet Take 1 tablet (10 mg total) by mouth daily. KEEP 10/15/16 APPT FOR MORE REFILLS 30 tablet 0  . metoprolol tartrate (LOPRESSOR) 25 MG tablet take 1 tablet by mouth twice a day 60 tablet 1  . pantoprazole (PROTONIX) 40 MG tablet Take 1 tablet (40 mg total) by mouth 2 (two) times daily before a meal. (Patient not taking: Reported on 09/13/2016) 60 tablet 1   No current facility-administered medications for this visit.     Allergies:   Patient has no known allergies.    Social History:  The patient  reports that he has been smoking Cigarettes.  He has a 15.00 pack-year smoking history. He has never used smokeless tobacco. He reports that he uses drugs, including Marijuana. He reports that he does not drink alcohol.   Family History:  The patient's ***family history includes Heart attack in his maternal grandfather.    ROS:  Please see the history of present illness.   Otherwise, review of systems are positive for {NONE DEFAULTED:18576::"none"}.   All other systems are reviewed and negative.    PHYSICAL EXAM: VS:  There were no vitals taken for this visit. , BMI There is no height or weight on file to calculate BMI. GENERAL:  Well appearing HEENT:  Pupils equal round and reactive, fundi not visualized, oral mucosa unremarkable NECK:  No jugular venous distention, waveform within normal limits, carotid upstroke brisk and symmetric, no bruits, no thyromegaly LYMPHATICS:  No cervical, inguinal adenopathy LUNGS:  Clear to auscultation bilaterally BACK:  No CVA tenderness CHEST:  Unremarkable HEART:  PMI not displaced or sustained,S1 and S2 within normal limits, no S3, no S4, no clicks, no rubs, *** murmurs ABD:  Flat, positive bowel sounds normal in frequency in pitch, no bruits, no  rebound, no guarding, no midline pulsatile mass, no hepatomegaly, no splenomegaly EXT:  2 plus pulses throughout, no edema, no cyanosis no clubbing SKIN:  No rashes no nodules NEURO:  Cranial nerves II through XII grossly intact, motor grossly intact throughout PSYCH:  Cognitively intact, oriented to person place and time    EKG:  EKG {ACTION; IS/IS KGM:[id number]} ordered today. The ekg ordered today demonstrates ***   Recent Labs: 11/22/2015: Magnesium 1.8 09/13/2016: ALT 26; BUN 13; Creat 1.05; Hemoglobin 13.2; Platelets 256; Potassium 3.7; Sodium 141; TSH 0.51    Lipid Panel    Component Value Date/Time   CHOL 204 (H) 09/13/2016 0935   TRIG 80 09/13/2016 0935   HDL 55 09/13/2016 0935   CHOLHDL 3.7 09/13/2016 0935   VLDL 16 09/13/2016 0935   LDLCALC 133 (H) 09/13/2016 0935      Wt Readings from Last 3 Encounters:  09/13/16 175 lb (79.4 kg)  02/07/16 153 lb 3.2 oz (69.5 kg)  01/17/16 150  lb (68 kg)      Other studies Reviewed: Additional studies/ records that were reviewed today include: ***. Review of the above records demonstrates:  Please see elsewhere in the note.  ***   ASSESSMENT AND PLAN:  CRYPTOGENIC STROKE:   ***  NONISCHEMIC CARDIOMYOPATHY:  ***  He had nonobstructive CAD on cath at the time of his stroke.    HTN:  ***  CHRONIC SYSTOLIC HF:  ***  TOBACCO ABUSE:  ETOH ABUSE:  CAD:  He had nonobstructive CAD on cath in 2017  ***   Current medicines are reviewed at length with the patient today.  The patient {ACTIONS; HAS/DOES NOT HAVE:19233} concerns regarding medicines..  The following changes have been made:  {PLAN; NO CHANGE:13088:s}  Labs/ tests ordered today include: *** No orders of the defined types were placed in this encounter.    Disposition:   FU with ***    Signed, Minus Breeding, MD  09/22/2016 9:46 PM     [facility]

## 2016-09-23 ENCOUNTER — Encounter: Payer: Self-pay | Admitting: *Deleted

## 2016-09-23 ENCOUNTER — Ambulatory Visit: Payer: Self-pay | Admitting: Cardiology

## 2016-09-26 MED FILL — AMLODIPINE BESYLATE 2.5 MG: 2.5 | 30 days supply | Qty: 30 | Fill #0

## 2016-09-26 MED FILL — LISINOPRIL 10 MG TABLET: 10 | 30 days supply | Qty: 30 | Fill #0

## 2016-09-26 NOTE — Progress Notes (Deleted)
Cardiology Office Note   Date:  09/26/2016   ID:  Gregory Perry, DOB [birth year], MRN [id number]  PCP:  Molli Barrows, FNP  Cardiologist:   Minus Breeding, MD  Referring:  ***  No chief complaint on file.     History of Present Illness: Gregory Perry is a 66 y.o. male who presents for evaluation of nonischemic cardiomyopathy.  He had an acute CVA.  This was felt to be embolic with unknown source.  He did have a cardiomyopathy.  This was evaluated with an out patient [facility] Adam Myoview that suggested prior MI.  However, follow up cath did not demonstrate obstructive CAD. Marland Kitchen  He had a Linq placed.  I have reviewed hospital an doffice records as he is new to me.  He has had no atrial fib on his implanted loop to date.   ***   Of note he did have an esophageal stricture evaluated on that admission. He had candida infectoin.    66 year old male with a history of hypertension, tobacco abuse, alcohol abuse presents with one-day history of gait instability and fall. Upon further questioning, the patient did not have loss of consciousness. However, the patient stated that he "did not feel right"the day prior to admission. He denied any dysarthria, focal collection may weakness, loss of vision. Initial CT of the brain revealed evolving acute or subacute infarct in the left occipital lobe. MRI revealed a left PCA infarct. As result, neurology was consulted and full stroke workup was undertaken. In addition, the patient has been complaining of dysphagia and odynophagia for the last 2-3 years. He has had intermittent vomiting because he is not able to swallow food. In addition he has lost 30 pounds in the past 6-12 months. On 04/29/2011, the patient had an EGD performed by Dr. Oletta Lamas which revealed a stricture in the proximal esophagus with a bloody ulcer. There was difficulty advancing the scope at that time which required ENT to assist.GI was consulted.    Past Medical History:  Diagnosis Date   . Alcohol abuse   . Candida esophagitis ([place])   . Cardiomyopathy ([place])   . CVA (cerebral infarction)   . Duodenal ulcer   . Dysphagia   . Headache(784.0)    history of  . Hypertension   . Severe protein-calorie malnutrition ([place])   . Stroke [facility])     Past Surgical History:  Procedure Laterality Date  . BALLOON DILATION N/A 11/22/2015   Procedure: BALLOON DILATION;  Surgeon: Mauri Pole, MD;  Location: [place]OS[facility];  Service: Endoscopy;  Laterality: N/A;  . CARDIAC CATHETERIZATION N/A 01/17/2016   Procedure: Left Heart Cath and Coronary Angiography;  Surgeon: Sherren Mocha, MD;  Location: [facility] CV LAB;  Service: Cardiovascular;  Laterality: N/A;  . DIRECT LARYNGOSCOPY  05/07/2011   Procedure: DIRECT LARYNGOSCOPY;  Surgeon: Rozetta Nunnery, MD;  Location: [place];  Service: ENT;  Laterality: N/A;  ESOPHAGOSCOPY  WITH DILATION  . EP IMPLANTABLE DEVICE N/A 11/17/2015   Procedure: Loop Recorder Insertion;  Surgeon: Evans Lance, MD;  Location: [place] CV LAB;  Service: Cardiovascular;  Laterality: N/A;  . ESOPHAGOGASTRODUODENOSCOPY  04/29/2011   Procedure: ESOPHAGOGASTRODUODENOSCOPY (EGD);  Surgeon: Winfield Cunas., MD;  Location: [facility];  Service: Endoscopy;  Laterality: N/A;  . ESOPHAGOGASTRODUODENOSCOPY N/A 11/22/2015   Procedure: ESOPHAGOGASTRODUODENOSCOPY (EGD);  Surgeon: Mauri Pole, MD;  Location: [facility] END[facility]COPY;  Service: Endoscopy;  Laterality: N/A;  . LOOP RECORDER IMPLANT  Current Outpatient Prescriptions  Medication Sig Dispense Refill  . amLODipine (NORVASC) 2.5 MG tablet Take 1 tablet (2.5 mg total) by mouth daily. 30 tablet 0  . aspirin EC 325 MG tablet Take 1 tablet (325 mg total) by mouth daily. 30 tablet 1  . atorvastatin (LIPITOR) 40 MG tablet Take 1 tablet (40 mg total) by mouth daily at 6 PM. 90 tablet 2  . Blood Pressure Monitoring (BLOOD PRESSURE CUFF) MISC Check blood pressure daily between 8 pm -9 pm 1 each 0  . feeding  supplement (BOOST / RESOURCE BREEZE) LIQD Take 1 Container by mouth 3 (three) times daily between meals. (Patient not taking: Reported on 09/13/2016) 90 Container 0  . lisinopril (PRINIVIL,ZESTRIL) 10 MG tablet Take 1 tablet (10 mg total) by mouth daily. KEEP 10/15/16 APPT FOR MORE REFILLS 30 tablet 0  . metoprolol tartrate (LOPRESSOR) 25 MG tablet take 1 tablet by mouth twice a day 60 tablet 1  . pantoprazole (PROTONIX) 40 MG tablet Take 1 tablet (40 mg total) by mouth 2 (two) times daily before a meal. (Patient not taking: Reported on 09/13/2016) 60 tablet 1   No current facility-administered medications for this visit.     Allergies:   Patient has no known allergies.    Social History:  The patient  reports that he has been smoking Cigarettes.  He has a 15.00 pack-year smoking history. He has never used smokeless tobacco. He reports that he uses drugs, including Marijuana. He reports that he does not drink alcohol.   Family History:  The patient's ***family history includes Heart attack in his maternal grandfather.    ROS:  Please see the history of present illness.   Otherwise, review of systems are positive for {NONE DEFAULTED:18576::"none"}.   All other systems are reviewed and negative.    PHYSICAL EXAM: VS:  There were no vitals taken for this visit. , BMI There is no height or weight on file to calculate BMI. GENERAL:  Well appearing HEENT:  Pupils equal round and reactive, fundi not visualized, oral mucosa unremarkable NECK:  No jugular venous distention, waveform within normal limits, carotid upstroke brisk and symmetric, no bruits, no thyromegaly LYMPHATICS:  No cervical, inguinal adenopathy LUNGS:  Clear to auscultation bilaterally BACK:  No CVA tenderness CHEST:  Unremarkable HEART:  PMI not displaced or sustained,S1 and S2 within normal limits, no S3, no S4, no clicks, no rubs, *** murmurs ABD:  Flat, positive bowel sounds normal in frequency in pitch, no bruits, no rebound,  no guarding, no midline pulsatile mass, no hepatomegaly, no splenomegaly EXT:  2 plus pulses throughout, no edema, no cyanosis no clubbing SKIN:  No rashes no nodules NEURO:  Cranial nerves II through XII grossly intact, motor grossly intact throughout PSYCH:  Cognitively intact, oriented to person place and time    EKG:  EKG {ACTION; IS/IS QHU:[id number]} ordered today. The ekg ordered today demonstrates ***   Recent Labs: 11/22/2015: Magnesium 1.8 09/13/2016: ALT 26; BUN 13; Creat 1.05; Hemoglobin 13.2; Platelets 256; Potassium 3.7; Sodium 141; TSH 0.51    Lipid Panel    Component Value Date/Time   CHOL 204 (H) 09/13/2016 0935   TRIG 80 09/13/2016 0935   HDL 55 09/13/2016 0935   CHOLHDL 3.7 09/13/2016 0935   VLDL 16 09/13/2016 0935   LDLCALC 133 (H) 09/13/2016 0935      Wt Readings from Last 3 Encounters:  09/13/16 175 lb (79.4 kg)  02/07/16 153 lb 3.2 oz (69.5 kg)  01/17/16 150  lb (68 kg)      Other studies Reviewed: Additional studies/ records that were reviewed today include: ***. Review of the above records demonstrates:  Please see elsewhere in the note.  ***   ASSESSMENT AND PLAN:  CRYPTOGENIC STROKE:   ***  NONISCHEMIC CARDIOMYOPATHY:  ***  He had nonobstructive CAD on cath at the time of his stroke.    HTN:  ***  CHRONIC SYSTOLIC HF:  ***  TOBACCO ABUSE:  ETOH ABUSE:  CAD:  He had nonobstructive CAD on cath in 2017  ***   Current medicines are reviewed at length with the patient today.  The patient {ACTIONS; HAS/DOES NOT HAVE:19233} concerns regarding medicines..  The following changes have been made:  {PLAN; NO CHANGE:13088:s}  Labs/ tests ordered today include: *** No orders of the defined types were placed in this encounter.    Disposition:   FU with ***    Signed, Minus Breeding, MD  09/26/2016 8:48 PM    [facility]

## 2016-09-27 ENCOUNTER — Encounter: Payer: Self-pay | Admitting: Family Medicine

## 2016-09-27 ENCOUNTER — Ambulatory Visit: Payer: Self-pay | Admitting: Cardiology

## 2016-09-27 ENCOUNTER — Ambulatory Visit (INDEPENDENT_AMBULATORY_CARE_PROVIDER_SITE_OTHER): Payer: Self-pay | Admitting: Family Medicine

## 2016-09-27 DIAGNOSIS — I10 Essential (primary) hypertension: Secondary | ICD-10-CM

## 2016-09-27 LAB — POCT URINALYSIS DIP (DEVICE)
Bilirubin Urine: NEGATIVE
Glucose, UA: NEGATIVE mg/dL
Ketones, ur: NEGATIVE mg/dL
LEUKOCYTES UA: NEGATIVE
NITRITE: NEGATIVE
Protein, ur: 30 mg/dL — AB
Specific Gravity, Urine: >=1.03 (ref 1.005–1.030)
UROBILINOGEN UA: 0.2 mg/dL (ref 0.0–1.0)
pH: 5.5 (ref 5.0–8.0)

## 2016-09-27 MED ORDER — AMLODIPINE BESYLATE 10 MG PO TABS: 10.0000 mg | ORAL_TABLET | Freq: Every day | ORAL | 0 refills | Status: DC

## 2016-09-27 MED ORDER — LISINOPRIL 10 MG PO TABS: 10.0000 mg | ORAL_TABLET | Freq: Every day | ORAL | 0 refills | Status: DC

## 2016-09-27 NOTE — Progress Notes (Signed)
Patient ID: Gregory Perry, male    DOB: 05/18/1951, 66 y.o.   MRN: [id number]  PCP: Molli Barrows, FNP  Chief Complaint  Patient presents with  . Follow-up    Blood pressure    Subjective:  HPI  Gregory Perry is a 66 y.o. male presents for a blood pressure recheck and follow-up.  Current medical problems include: CVA, EF < 35% with cardiomyopathy with pacemaker placement, accelerated  hypertension, and current 1 pack per day cigarette smoker.  During last office visit, he was referred to cardiology for routine follow-up of cardiomyopathy. Mr. Cedar advised me that he would not be able to see cardiology as he has to make a payment of $200 which he currently doesn't have. He also informed that his blood pressure has remained elevated all week (160's/90's) as he ran out of lisinopril and currently doesn't have money to obtain medication from the pharmacy as his check will not come until next week. Feels that blood pressure is elevated as his sister lives with home and is not contributing financially to the household which is placing increased stress on him. Continues to experienced headaches intermittently, which typically resolve with rest.    Social History   Social History  . Marital status: Widowed    Spouse name: N/A  . Number of children: N/A  . Years of education: N/A   Occupational History  . Not on file.   Social History Main Topics  . Smoking status: Current Every Day Smoker    Packs/day: 0.50    Years: 30.00    Types: Cigarettes  . Smokeless tobacco: Never Used     Comment: Pt smokes 6 to 7 a day trying to quit  . Alcohol use No     Comment: quit in 10/2015   . Drug use: Yes    Types: Marijuana     Comment: occ  . Sexual activity: Not on file   Other Topics Concern  . Not on file   Social History Narrative  . No narrative on file    Family History  Problem Relation Age of Onset  . Heart attack Maternal Grandfather    Review of Systems See  HPI  Patient Active Problem List   Diagnosis Date Noted  . Abnormal nuclear stress test 01/17/2016  . Candida esophagitis ([place])   . Pre-operative cardiovascular examination, LVEF < 35% 11/20/2015  . Cryptogenic stroke ([place]) 11/17/2015  . Essential hypertension 11/17/2015  . GERD (gastroesophageal reflux disease) 11/17/2015  . Loss of weight 11/17/2015  . Tobacco abuse 11/17/2015  . Dysphagia 11/17/2015  . Protein-calorie malnutrition, severe 11/17/2015  . Cerebral infarction due to thrombosis of right posterior cerebral artery ([place])   . History of stroke   . Cardiomyopathy, ischemic   . Esophageal stricture   . Dysphasia 05/07/2011    No Known Allergies  Prior to Admission medications   Medication Sig Start Date End Date Taking? Authorizing Provider  amLODipine (NORVASC) 2.5 MG tablet Take 1 tablet (2.5 mg total) by mouth daily. 09/13/16  Yes [name], FNP  aspirin EC 325 MG tablet Take 1 tablet (325 mg total) by mouth daily. 11/23/15  Yes Modena Jansky, MD  atorvastatin (LIPITOR) 40 MG tablet Take 1 tablet (40 mg total) by mouth daily at 6 PM. 09/15/16  Yes [name], FNP  Blood Pressure Monitoring (BLOOD PRESSURE CUFF) MISC Check blood pressure daily between 8 pm -9 pm 09/13/16  Yes [name], FNP  feeding supplement (BOOST / RESOURCE BREEZE) LIQD Take 1 Container by mouth 3 (three) times daily between meals. 11/20/15  Yes Orson Eva, MD  lisinopril (PRINIVIL,ZESTRIL) 10 MG tablet Take 1 tablet (10 mg total) by mouth daily. KEEP 10/15/16 APPT FOR MORE REFILLS 09/17/16  Yes Evans Lance, MD  metoprolol tartrate (LOPRESSOR) 25 MG tablet take 1 tablet by mouth twice a day 07/30/16  Yes Micheline Chapman, NP  pantoprazole (PROTONIX) 40 MG tablet Take 1 tablet (40 mg total) by mouth 2 (two) times daily before a meal. 11/23/15  Yes Modena Jansky, MD    Past Medical, Surgical Family and Social History reviewed and updated.    Objective:   Blood pressure (!)  170/80, 156/90 (repeated)  pulse 60, temperature 97.9 F (36.6 C), temperature source Oral, resp. rate 18, height 5\' 11"  /(1.803 m), weight 171 lb (77.6 kg), SpO2 100 %.  Wt Readings from Last 3 Encounters:  09/27/16 171 lb (77.6 kg)  09/13/16 175 lb (79.4 kg)  02/07/16 153 lb 3.2 oz (69.5 kg)   Physical Exam  Constitutional: He is oriented to person, place, and time. He appears well-developed and well-nourished.  HENT:  Head: Normocephalic and atraumatic.  Eyes: Conjunctivae and EOM are normal. Pupils are equal, round, and reactive to light.  Cardiovascular: Normal rate, normal heart sounds and intact distal pulses.   Pulmonary/Chest: Effort normal and breath sounds normal.  Musculoskeletal: Normal range of motion.  Neurological: He is alert and oriented to person, place, and time.  Skin: Skin is warm.  Psychiatric: He has a normal mood and affect. His behavior is normal. Judgment and thought content normal.     Assessment & Plan:  1. Essential hypertension - Amlodipine  (NORVASC) 10 MG tablet; Take 1 tablet (10 mg total) by mouth daily.  Dispense: 90 tablet; Refill: 0 -Continue Lisinopril 10 mg once daily.  Please obtain medications as soon as possible in order to improve blood pressure readings and prevent further risk of experiencing another CVA.  -If you are unable to keep your appointment with cardiology please call and reschedule to avoid "No Show " charges. -It is imperative that you complete [place] program.  RTC: 3 months for hypertension management. I will follow-up with you via phone in 3 weeks to review home blood pressure readings. Return to the office sooner if needed or if blood pressure is greater than 180/90.   Carroll Sage. [name], MSN, [facility] [address] [place], [place] [address] [phone]

## 2016-09-27 NOTE — Patient Instructions (Signed)
For blood pressure  I have increased your blood pressure medication, Amlodipine 10 mg once daily.  I have refilled your lisinopril 10 mg.  I will follow-up with you via phone via 3 weeks and return in 3 months for hypertension follow-up.  Please complete the [place] financial assistance form and contact cardiology if you are unable to pay for the visit.  Continue to check blood pressure at home and notify me if readings are greater than 180/90.

## 2016-09-30 ENCOUNTER — Ambulatory Visit (INDEPENDENT_AMBULATORY_CARE_PROVIDER_SITE_OTHER): Payer: Self-pay | Admitting: *Deleted

## 2016-09-30 DIAGNOSIS — I639 Cerebral infarction, unspecified: Secondary | ICD-10-CM

## 2016-10-01 NOTE — Progress Notes (Signed)
Carelink Summary Report / Loop Recorder 

## 2016-10-03 MED FILL — ?AMLODIPINE BESYLATE 10 MG: 10 | 30 days supply | Qty: 30 | Fill #0

## 2016-10-14 NOTE — Progress Notes (Signed)
Cardiology Office Note Date:  10/15/2016  Patient ID:  Gregory Perry, Gregory Perry 1950/11/24, MRN [id number] PCP:  [name], FNP  Cardiologist:  Dr. Johnsie Cancel Electrophysiologist: Dr. Lovena Le   Chief Complaint: due for visit  History of Present Illness: Gregory Perry is a 66 y.o. male with history of cryptogenic stroke w/ILR, NICM, CAD w/branch vessel disease, chronic CHF (systolic), HTN.  He comes in today to be seen for Dr. Lovena Le.  He was last seenby EP service w/Amber Lynnell Jude, NP in Sept 2017, he was s/p LHC at that time with branch vessel disease to be treated medically/risk modification, he had not been found to have any AF by his ILR up to that visit.  He was started on Norvasc last month by PMD service.  The patient feels like he is doing well.  His BP is high today again, he tells me he had been without his medicines for about 2 weeks or so, back on 7-10 days.  This was a financial issue, but says he has changed pharmacy and is much better for him with lower cost.  He denies any CP or palpitations, no SOB or exertional intolerances.  He push mows his lawn and does his yard work without difficulty.  He has not had any dizziness, no near syncope or syncope.  Device information: MDT ILR, implanted 11/17/15, Dr. Lovena Le, Cryptogenic stroke Hx of false AF detections for PACs/PVCs April had 1 SVT   Past Medical History:  Diagnosis Date  . Alcohol abuse   . Candida esophagitis ([place])   . Cardiomyopathy ([place])   . CVA (cerebral infarction)   . Duodenal ulcer   . Dysphagia   . Headache(784.0)    history of  . Hypertension   . Severe protein-calorie malnutrition ([place])   . Stroke [facility])     Past Surgical History:  Procedure Laterality Date  . BALLOON DILATION N/A 11/22/2015   Procedure: BALLOON DILATION;  Surgeon: Mauri Pole, MD;  Location: [place] ENDOSCOPY;  Service: Endoscopy;  Laterality: N/A;  . CARDIAC CATHETERIZATION N/A 01/17/2016   Procedure: Left Heart Cath and  Coronary Angiography;  Surgeon: Sherren Mocha, MD;  Location: [place] CV LAB;  Service: Cardiovascular;  Laterality: N/A;  . DIRECT LARYNGOSCOPY  05/07/2011   Procedure: DIRECT LARYNGOSCOPY;  Surgeon: Rozetta Nunnery, MD;  Location: [place];  Service: ENT;  Laterality: N/A;  ESOPHAGOSCOPY  WITH DILATION  . EP IMPLANTABLE DEVICE N/A 11/17/2015   Procedure: Loop Recorder Insertion;  Surgeon: Evans Lance, MD;  Location: [place] CV LAB;  Service: Cardiovascular;  Laterality: N/A;  . ESOPHAGOGASTRODUODENOSCOPY  04/29/2011   Procedure: ESOPHAGOGASTRODUODENOSCOPY (EGD);  Surgeon: Winfield Cunas., MD;  Location: [facility];  Service: Endoscopy;  Laterality: N/A;  . ESOPHAGOGASTRODUODENOSCOPY N/A 11/22/2015   Procedure: ESOPHAGOGASTRODUODENOSCOPY (EGD);  Surgeon: Mauri Pole, MD;  Location: [facility] ENDOSCOPY;  Service: Endoscopy;  Laterality: N/A;  . LOOP RECORDER IMPLANT      Current Outpatient Prescriptions  Medication Sig Dispense Refill  . amLODipine (NORVASC) 10 MG tablet Take 1 tablet (10 mg total) by mouth daily. 90 tablet 0  . aspirin EC 325 MG tablet Take 1 tablet (325 mg total) by mouth daily. 30 tablet 1  . atorvastatin (LIPITOR) 40 MG tablet Take 1 tablet (40 mg total) by mouth daily at 6 PM. 90 tablet 2  . Blood Pressure Monitoring (BLOOD PRESSURE CUFF) MISC Check blood pressure daily between 8 pm -9 pm 1 each 0  . feeding  supplement (BOOST / RESOURCE BREEZE) LIQD Take 1 Container by mouth 3 (three) times daily between meals. 90 Container 0  . lisinopril (PRINIVIL,ZESTRIL) 10 MG tablet Take 1 tablet (10 mg total) by mouth daily. KEEP 10/15/16 APPT FOR MORE REFILLS 90 tablet 0  . metoprolol tartrate (LOPRESSOR) 25 MG tablet take 1 tablet by mouth twice a day 60 tablet 1  . pantoprazole (PROTONIX) 40 MG tablet Take 1 tablet (40 mg total) by mouth 2 (two) times daily before a meal. 60 tablet 1   No current facility-administered medications for this visit.     Allergies:    Patient has no known allergies.   Social History:  The patient  reports that he has been smoking Cigarettes.  He has a 15.00 pack-year smoking history. He has never used smokeless tobacco. He reports that he uses drugs, including Marijuana. He reports that he does not drink alcohol.   Family History:  The patient's family history includes Heart attack in his maternal grandfather.  ROS:  Please see the history of present illness.  All other systems are reviewed and otherwise negative.   PHYSICAL EXAM:  VS:  BP (!) 152/78   Pulse 78   Ht 5\' 11"  (1.803 m)   Wt 173 lb (78.5 kg)   BMI 24.13 kg/m  BMI: Body mass index is 24.13 kg/m. Well nourished, well developed, in no acute distress  HEENT: normocephalic, atraumatic  Neck: no JVD, carotid bruits or masses Cardiac:  RRR; no significant murmurs, no rubs, or gallops Lungs:  CTA b/l, no wheezing, rhonchi or rales  Abd: soft, nontender MS: no deformity or atrophy Ext: no edema  Skin: warm and dry, no rash Neuro:  No gross deficits appreciated Psych: euthymic mood, full affect  ILR site is stable, no tethering or discomfort   EKG:  Done 12/07/15 was SR, 61bpm ILR interrogation done today by industry and reviewed by myself: Tachy episode looks an SVT/12 seconds, AF episodes are SR with PACs, he had a brady episode, HR 50's, single beat-beat 36bpm, 1.6second pause  01/17/16: LHC Conclusion   Prox RCA to Mid RCA lesion, 50 %stenosed.  Mid RCA to Dist RCA lesion, 40 %stenosed.  Acute Mrg lesion, 90 %stenosed.  Ost 1st Mrg to 1st Mrg lesion, 50 %stenosed.  Prox LAD to Mid LAD lesion, 40 %stenosed. 1. Mild to moderate diffuse nonobstructive coronary artery disease with the exception of tight stenosis in an RV marginal branch 2. Known moderate LV dysfunction by echo  Regulation: Continue secondary risk reduction measures. The patient does not require PCI. He likely has a nonischemic cardiomyopathy. He does have diffuse  atherosclerosis and should be treated aggressively with medication.   11/17/15: TTE Study Conclusions - Left ventricle: The cavity size was mildly dilated. There was   mild concentric hypertrophy. Systolic function was moderately   reduced. The estimated ejection fraction was in the range of 35%   to 40%. Diffuse hypokinesis. Doppler parameters are consistent   with abnormal left ventricular relaxation (grade 1 diastolic   dysfunction). There was no evidence of elevated ventricular   filling pressure by Doppler parameters. - Aortic valve: There was trivial regurgitation. - Aortic root: The aortic root was normal in size. - Mitral valve: Structurally normal valve. - Right ventricle: Systolic function was normal. - Right atrium: The atrium was normal in size. - Atrial septum: No defect or patent foramen ovale was identified. - Pulmonary arteries: Systolic pressure was within the normal   range. - Inferior  vena cava: The vessel was normal in size. - Pericardium, extracardiac: There was no pericardial effusion. Impressions: - Negative bubble syudy. No evidence for ASD or PFO.  Recent Labs: 11/22/2015: Magnesium 1.8 09/13/2016: ALT 26; BUN 13; Creat 1.05; Hemoglobin 13.2; Platelets 256; Potassium 3.7; Sodium 141; TSH 0.51  09/13/2016: Cholesterol 204; HDL 55; LDL Cholesterol 133; Total CHOL/HDL Ratio 3.7; Triglycerides 80; VLDL 16   CrCl cannot be calculated (Patient's most recent lab result is older than the maximum 21 days allowed.).   Wt Readings from Last 3 Encounters:  10/15/16 173 lb (78.5 kg)  09/27/16 171 lb (77.6 kg)  09/13/16 175 lb (79.4 kg)     Other studies reviewed: Additional studies/records reviewed today include: summarized above  ASSESSMENT AND PLAN:  1. Cryptogenic stroke w/ILR     no true AF noted to date     Has been on 325mg  ASA dose since his CVA  The patient is instructed to call his PMD or neurologist to clarify ASA dose/recommendation  2.  HTN,hypertensive heart disease     High again today, he was recently started on Norvasc and had been without his meds a few weeks, back on about a week or so      Will have him see RPH/BP check visit, he is asked to bring his medicines with him to ensure an accurate list  3. NICM     On BB/ACE     Weight is stable/down, Exam is euvolemic, no symptoms to suggest fluid OL  4. CAD (branch disease)      No anginal symptoms     He is back on his statin, will c/w his PMD   Disposition: as above, and 3-110months with myself to ensure good BP control, and if he has been reliably on his medicines, may repeat echo later in the year   Current medicines are reviewed at length with the patient today.  The patient did not have any concerns regarding medicines.  Haywood Lasso, PA-C 10/15/2016 12:03 PM     [place] [address] [phone] (office)  [phone] (fax)

## 2016-10-15 ENCOUNTER — Ambulatory Visit (INDEPENDENT_AMBULATORY_CARE_PROVIDER_SITE_OTHER): Payer: Self-pay | Admitting: Physician Assistant

## 2016-10-15 ENCOUNTER — Encounter: Payer: Self-pay | Admitting: Internal Medicine

## 2016-10-15 VITALS — BP 152/78 | HR 78 | Ht 71.0 in | Wt 173.0 lb

## 2016-10-15 DIAGNOSIS — I5020 Unspecified systolic (congestive) heart failure: Secondary | ICD-10-CM

## 2016-10-15 DIAGNOSIS — I255 Ischemic cardiomyopathy: Secondary | ICD-10-CM

## 2016-10-15 DIAGNOSIS — I110 Hypertensive heart disease with heart failure: Secondary | ICD-10-CM

## 2016-10-15 DIAGNOSIS — I2510 Atherosclerotic heart disease of native coronary artery without angina pectoris: Secondary | ICD-10-CM

## 2016-10-15 DIAGNOSIS — Z4509 Encounter for adjustment and management of other cardiac device: Secondary | ICD-10-CM

## 2016-10-15 LAB — CUP PACEART REMOTE DEVICE CHECK
Implantable Pulse Generator Implant Date: 20170623
MDC IDC SESS DTM: 20180507233910

## 2016-10-15 NOTE — Patient Instructions (Addendum)
Medication Instructions:   Your physician recommends that you continue on your current medications as directed. Please refer to the Current Medication list given to you today.    If you need a refill on your cardiac medications before your next appointment, please call your pharmacy.  Labwork: NONE ORDERED  TODAY    Testing/Procedures: NONE ORDERED  TODAY    Follow-Up: WITH PHARM D MEDICATIONS AND BLOOD PRESSURE FOLLOW UP   IN 3 MONTHS WITH RENEE URUSY   Any Other Special Instructions Will Be Listed Below (If Applicable).  MAKE SURE YOU BRING ALL YOUR MEDICATIONS DURING YOUR NEXT VISIT   PLEASE CONTACT YOUR NEUROLOGIST ABOUT ASPRIN DOSE

## 2016-10-16 ENCOUNTER — Encounter: Payer: Self-pay | Admitting: Internal Medicine

## 2016-10-17 ENCOUNTER — Telehealth: Payer: Self-pay

## 2016-10-17 DIAGNOSIS — E785 Hyperlipidemia, unspecified: Secondary | ICD-10-CM

## 2016-10-17 DIAGNOSIS — I10 Essential (primary) hypertension: Secondary | ICD-10-CM

## 2016-10-18 MED ORDER — AMLODIPINE BESYLATE 10 MG PO TABS: 10.0000 mg | ORAL_TABLET | Freq: Every day | ORAL | 1 refills | Status: DC

## 2016-10-18 MED ORDER — ASPIRIN EC 325 MG PO TBEC: 325.0000 mg | DELAYED_RELEASE_TABLET | Freq: Every day | ORAL | 2 refills | Status: DC

## 2016-10-18 MED ORDER — BOOST / RESOURCE BREEZE PO LIQD: 1.0000 | Freq: Three times a day (TID) | ORAL | 2 refills | Status: DC

## 2016-10-18 MED ORDER — METOPROLOL TARTRATE 25 MG PO TABS: 25.0000 mg | ORAL_TABLET | Freq: Two times a day (BID) | ORAL | 1 refills | Status: DC

## 2016-10-18 MED ORDER — ATORVASTATIN CALCIUM 40 MG PO TABS: 40.0000 mg | ORAL_TABLET | Freq: Every day | ORAL | 2 refills | Status: DC

## 2016-10-18 MED ORDER — PANTOPRAZOLE SODIUM 40 MG PO TBEC: 40.0000 mg | DELAYED_RELEASE_TABLET | Freq: Two times a day (BID) | ORAL | 2 refills | Status: DC

## 2016-10-18 MED FILL — ?ATORVASTATIN 40MG TABLET: 40 | 30 days supply | Qty: 30 | Fill #0

## 2016-10-18 MED FILL — ?PANTOPRAZOLE SOD DR 40MG: 40 MG | 30 days supply | Qty: 60 | Fill #0

## 2016-10-18 NOTE — Telephone Encounter (Signed)
All medications sent to [facility]

## 2016-10-23 ENCOUNTER — Encounter: Payer: Self-pay | Admitting: Internal Medicine

## 2016-10-23 ENCOUNTER — Other Ambulatory Visit: Payer: Self-pay | Admitting: Family Medicine

## 2016-10-23 DIAGNOSIS — I10 Essential (primary) hypertension: Secondary | ICD-10-CM

## 2016-10-23 MED FILL — LISINOPRIL 10 MG TABLET: 10 | 30 days supply | Qty: 30 | Fill #0

## 2016-10-23 MED FILL — AMLODIPINE BESYLATE 2.5 MG: 2.5 | 30 days supply | Qty: 30 | Fill #0

## 2016-10-28 ENCOUNTER — Encounter: Payer: Self-pay | Admitting: Internal Medicine

## 2016-10-30 ENCOUNTER — Ambulatory Visit (INDEPENDENT_AMBULATORY_CARE_PROVIDER_SITE_OTHER): Payer: Self-pay | Admitting: *Deleted

## 2016-10-30 DIAGNOSIS — I639 Cerebral infarction, unspecified: Secondary | ICD-10-CM

## 2016-11-01 NOTE — Progress Notes (Signed)
Carelink Summary Report / Loop Recorder 

## 2016-11-04 LAB — CUP PACEART REMOTE DEVICE CHECK
Implantable Pulse Generator Implant Date: 20170623
MDC IDC SESS DTM: 20180606234304

## 2016-11-04 NOTE — Progress Notes (Signed)
[facility] summary report received. Battery status OK. Normal device function. No new symptom episodes, brady, or pause episodes. 4 tachy- ECGs appear SVT w/ occ. PVC. +metoprolol 25 mg twice daily. 3 AF 0%- ECGs appear SR w/ PACs. No symptoms reported per EPIC. Monthly summary reports and ROV/PRN

## 2016-11-13 ENCOUNTER — Ambulatory Visit: Payer: Self-pay | Admitting: Pharmacist

## 2016-11-13 NOTE — Progress Notes (Deleted)
Patient ID: Gregory Perry                 DOB: 1950-05-29                      MRN: [id number]     HPI: Gregory Perry is a 66 y.o. male patient of Dr. Johnsie Cancel, referred by Tommye Standard, PA, to HTN clinic. PMH is significant for cryptogenic stroke w/ILR, NICM, CAD w/branch vessel disease, chronic CHF (systolic), and HTN. At his last office visit on 10/15/16, BP was noted to be elevated and patient stated he had been off of his medications for ~2 weeks then back on for ~1 week. Nonadherence likely due to cost. Patient was instructed to bring his medications to his appointment today for medication reconciliation and management.   Patient presents today for HTN clinic establishment.   Able to get meds *** Copay *** Current smoker *** cigarettes/day Last bmet 09/13/16 - scr 1.05, K 3.7  Current HTN meds:  Amlodipine ***mg daily (***) Lisinopril 10mg  daily (***) Metoprolol tartrate 25mg  bid  Previously tried: n/a  BP goal: < 130/80 mmHg  Family History: The patient's family history includes Heart attack in his maternal grandfather.  Social History: The patient  reports that he has been smoking Cigarettes.  He has a 15.00 pack-year smoking history. He has never used smokeless tobacco. He reports that he uses drugs, including Marijuana. He reports that he does not drink alcohol.   Diet: ***  Exercise: ***  Home BP readings: ***  Wt Readings from Last 3 Encounters:  10/15/16 173 lb (78.5 kg)  09/27/16 171 lb (77.6 kg)  09/13/16 175 lb (79.4 kg)   BP Readings from Last 3 Encounters:  10/15/16 (!) 152/78  09/27/16 (!) 170/80  09/13/16 (!) 160/102   Pulse Readings from Last 3 Encounters:  10/15/16 78  09/27/16 60  09/13/16 60    Renal function: CrCl cannot be calculated (Patient's most recent lab result is older than the maximum 21 days allowed.).  Past Medical History:  Diagnosis Date  . Alcohol abuse   . Candida esophagitis ([place])   . Cardiomyopathy ([place])   . CVA  (cerebral infarction)   . Duodenal ulcer   . Dysphagia   . Headache(784.0)    history of  . Hypertension   . Severe protein-calorie malnutrition ([place])   . Stroke [facility])     Current Outpatient Prescriptions on File Prior to Visit  Medication Sig Dispense Refill  . amLODipine (NORVASC) 10 MG tablet Take 1 tablet (10 mg total) by mouth daily. 90 tablet 1  . amLODipine (NORVASC) 2.5 MG tablet TAKE 1 TABLET BY MOUTH DAILY. 30 tablet 0  . aspirin EC 325 MG tablet Take 1 tablet (325 mg total) by mouth daily. 90 tablet 2  . atorvastatin (LIPITOR) 40 MG tablet Take 1 tablet (40 mg total) by mouth daily at 6 PM. 90 tablet 2  . Blood Pressure Monitoring (BLOOD PRESSURE CUFF) MISC Check blood pressure daily between 8 pm -9 pm 1 each 0  . feeding supplement (BOOST / RESOURCE BREEZE) LIQD Take 1 Container by mouth 3 (three) times daily between meals. 90 Container 2  . lisinopril (PRINIVIL,ZESTRIL) 10 MG tablet Take 1 tablet (10 mg total) by mouth daily. KEEP 10/15/16 APPT FOR MORE REFILLS 90 tablet 0  . metoprolol tartrate (LOPRESSOR) 25 MG tablet Take 1 tablet (25 mg total) by mouth 2 (two) times daily. 90 tablet 1  .  pantoprazole (PROTONIX) 40 MG tablet Take 1 tablet (40 mg total) by mouth 2 (two) times daily before a meal. 90 tablet 2   No current facility-administered medications on file prior to visit.     No Known Allergies   Assessment/Plan:  1. Hypertension - BP *** goal of < *** mmHg. Will ***. Follow-up ***.  Belia Heman, PharmD PGY1 Resident 11/13/2016 11:06 AM  Patient seen with: Fuller Canada, PharmD, CPP, [facility] [address], [place], [place] [address] Phone: [phone]; Fax: [phone]

## 2016-11-15 MED FILL — ?PANTOPRAZOLE SOD DR 40MG: 40 MG | 30 days supply | Qty: 60 | Fill #1

## 2016-11-15 MED FILL — ATORVASTATIN 40 MG TABLET: 40 | 30 days supply | Qty: 30 | Fill #1

## 2016-11-19 ENCOUNTER — Telehealth: Payer: Self-pay | Admitting: *Deleted

## 2016-11-19 MED FILL — LISINOPRIL 10 MG TABLET: 10 | 30 days supply | Qty: 30 | Fill #1

## 2016-11-19 MED FILL — METOPROLOL TARTRATE 25 MG T: 25 | 30 days supply | Qty: 30 | Fill #0

## 2016-11-19 NOTE — Telephone Encounter (Signed)
LMOVM for [name] [facility]) requesting call back to the [facility].

## 2016-11-19 NOTE — Telephone Encounter (Signed)
Unable to reach patient--phone is not accepting incoming calls.  LMOVM for sister, Fuller Canada [facility]), requesting call back.  Will plan to schedule f/u with Tommye Standard, PA, per recall and increase ILR tachy detection rate to 180bpm per Dr. Lovena Le at this appointment (enter in appt notes).

## 2016-11-20 ENCOUNTER — Other Ambulatory Visit: Payer: Self-pay | Admitting: Family Medicine

## 2016-11-20 DIAGNOSIS — I10 Essential (primary) hypertension: Secondary | ICD-10-CM

## 2016-11-21 MED FILL — AMLODIPINE BESYLATE 2.5 MG: 2.5 | 30 days supply | Qty: 30 | Fill #0

## 2016-11-29 ENCOUNTER — Ambulatory Visit (INDEPENDENT_AMBULATORY_CARE_PROVIDER_SITE_OTHER): Payer: Self-pay | Admitting: *Deleted

## 2016-11-29 DIAGNOSIS — I639 Cerebral infarction, unspecified: Secondary | ICD-10-CM

## 2016-12-02 NOTE — Progress Notes (Signed)
Carelink Summary Report / Loop Recorder 

## 2016-12-04 NOTE — Telephone Encounter (Signed)
LMOVM for Gregory Perry [facility]) requesting call back to the [facility].

## 2016-12-05 LAB — CUP PACEART REMOTE DEVICE CHECK
MDC IDC PG IMPLANT DT: 20170623
MDC IDC SESS DTM: 20180707001026

## 2016-12-05 NOTE — Telephone Encounter (Signed)
Spoke w/ pt wife and she agreed to an appt on 12-18-2016 at 9:00 AM with Tommye Standard, PA

## 2016-12-09 MED FILL — METOPROLOL TARTRATE 25 MG T: 25 | 30 days supply | Qty: 60 | Fill #1

## 2016-12-17 ENCOUNTER — Other Ambulatory Visit: Payer: Self-pay | Admitting: Family Medicine

## 2016-12-17 DIAGNOSIS — I10 Essential (primary) hypertension: Secondary | ICD-10-CM

## 2016-12-17 MED FILL — ATORVASTATIN 40 MG TABLET: 40 | 30 days supply | Qty: 30 | Fill #2

## 2016-12-17 MED FILL — ?PANTOPRAZOLE SOD DR 40MG: 40 MG | 30 days supply | Qty: 60 | Fill #2

## 2016-12-17 NOTE — Progress Notes (Addendum)
Cardiology Office Note Date:  12/17/2016  Patient ID:  Gregory Perry, Gregory Perry 10-01-1950, MRN [id number] PCP:  [name], FNP  Cardiologist:  Dr. Johnsie Cancel Electrophysiologist: Dr. Lovena Le   Chief Complaint: reprogram ILR tachy detection to 180 per GT  History of Present Illness: Gregory Perry is a 66 y.o. male with history of cryptogenic stroke w/ILR, NICM, CAD w/branch vessel disease, chronic CHF (systolic), HTN.  He comes in today to be seen for Dr. Lovena Le.  He was last seenby EP service w/Amber Lynnell Jude, NP in Sept 2017, he was s/p LHC at that time with branch vessel disease to be treated medically/risk modification, he had not been found to have any AF by his ILR up to that visit.  He was started on Norvasc last month by PMD service.  More recently in May was seen by myself, his BP was high today reporting he had been without his medicines for about 2 weeks or so, back on 7-10 days.  This was a financial issue, but had changed pharmacy and is much better for him with lower cost.    He continues to deny any CP or palpitations, no SOB or exertional intolerances.  He continues to push mow his lawn and does his yard work without difficulty.  He has not had any dizziness, no near syncope or syncope.  He has been taking his medicines as prescribed and daily, without coverage/insurance issues monitoring his BP at home getting typically about 160/80 some days a little lower and higher, today is a good day  Device information: MDT ILR, implanted 11/17/15, Dr. Lovena Le, Cryptogenic stroke Hx of false AF detections for PACs/PVCs April had 1 SVT   Past Medical History:  Diagnosis Date  . Alcohol abuse   . Candida esophagitis ([place])   . Cardiomyopathy ([place])   . CVA (cerebral infarction)   . Duodenal ulcer   . Dysphagia   . Headache(784.0)    history of  . Hypertension   . Severe protein-calorie malnutrition ([place])   . Stroke [facility])     Past Surgical History:  Procedure Laterality Date   . BALLOON DILATION N/A 11/22/2015   Procedure: BALLOON DILATION;  Surgeon: Mauri Pole, MD;  Location: [facility] ENDOSCOPY;  Service: Endoscopy;  Laterality: N/A;  . CARDIAC CATHETERIZATION N/A 01/17/2016   Procedure: Left Heart Cath and Coronary Angiography;  Surgeon: Sherren Mocha, MD;  Location: [place] CV LAB;  Service: Cardiovascular;  Laterality: N/A;  . DIRECT LARYNGOSCOPY  05/07/2011   Procedure: DIRECT LARYNGOSCOPY;  Surgeon: Rozetta Nunnery, MD;  Location: [place];  Service: ENT;  Laterality: N/A;  ESOPHAGOSCOPY  WITH DILATION  . EP IMPLANTABLE DEVICE N/A 11/17/2015   Procedure: Loop Recorder Insertion;  Surgeon: Evans Lance, MD;  Location: [place] CV LAB;  Service: Cardiovascular;  Laterality: N/A;  . ESOPHAGOGASTRODUODENOSCOPY  04/29/2011   Procedure: ESOPHAGOGASTRODUODENOSCOPY (EGD);  Surgeon: Winfield Cunas., MD;  Location: [facility];  Service: Endoscopy;  Laterality: N/A;  . ESOPHAGOGASTRODUODENOSCOPY N/A 11/22/2015   Procedure: ESOPHAGOGASTRODUODENOSCOPY (EGD);  Surgeon: Mauri Pole, MD;  Location: [facility] ENDOSCOPY;  Service: Endoscopy;  Laterality: N/A;  . LOOP RECORDER IMPLANT      Current Outpatient Prescriptions  Medication Sig Dispense Refill  . amLODipine (NORVASC) 10 MG tablet Take 1 tablet (10 mg total) by mouth daily. 90 tablet 1  . amLODipine (NORVASC) 2.5 MG tablet TAKE 1 TABLET BY MOUTH DAILY. 30 tablet 0  . aspirin EC 325 MG tablet Take 1  tablet (325 mg total) by mouth daily. 90 tablet 2  . atorvastatin (LIPITOR) 40 MG tablet Take 1 tablet (40 mg total) by mouth daily at 6 PM. 90 tablet 2  . Blood Pressure Monitoring (BLOOD PRESSURE CUFF) MISC Check blood pressure daily between 8 pm -9 pm 1 each 0  . feeding supplement (BOOST / RESOURCE BREEZE) LIQD Take 1 Container by mouth 3 (three) times daily between meals. 90 Container 2  . lisinopril (PRINIVIL,ZESTRIL) 10 MG tablet Take 1 tablet (10 mg total) by mouth daily. KEEP 10/15/16 APPT FOR MORE  REFILLS 90 tablet 0  . metoprolol tartrate (LOPRESSOR) 25 MG tablet Take 1 tablet (25 mg total) by mouth 2 (two) times daily. 90 tablet 1  . pantoprazole (PROTONIX) 40 MG tablet Take 1 tablet (40 mg total) by mouth 2 (two) times daily before a meal. 90 tablet 2   No current facility-administered medications for this visit.     Allergies:   Patient has no known allergies.   Social History:  The patient  reports that he has been smoking Cigarettes.  He has a 15.00 pack-year smoking history. He has never used smokeless tobacco. He reports that he uses drugs, including Marijuana. He reports that he does not drink alcohol.   Family History:  The patient's family history includes Heart attack in his maternal grandfather.  ROS:  Please see the history of present illness.  All other systems are reviewed and otherwise negative.   PHYSICAL EXAM:  VS:  There were no vitals taken for this visit. BMI: There is no height or weight on file to calculate BMI. Well nourished though thin,  well developed, in no acute distress  HEENT: normocephalic, atraumatic  Neck: no JVD, carotid bruits or masses Cardiac:  RRR; bradycardic, no significant murmurs, no rubs, or gallops Lungs:  CTA b/l, no wheezing, rhonchi or rales  Abd: soft, nontender MS: no deformity or atrophy Ext: no edema  Skin: warm and dry, no rash Neuro:  No gross deficits appreciated Psych: euthymic mood, full affect   ILR site is stable, no tethering or discomfort   EKG:  Done today and reviewed by myself is SB, 48bpm, 1st degree AVblock, PR 2100ms, QRS 35ms, QTc 384ms ILR interrogation done today by industry and reviewed by myself: AF episodes are SR with PACs, no  Brady events, tachy events have gradual onset and recovery, HR today 48-50bpm  01/17/16: LHC Conclusion   Prox RCA to Mid RCA lesion, 50 %stenosed.  Mid RCA to Dist RCA lesion, 40 %stenosed.  Acute Mrg lesion, 90 %stenosed.  Ost 1st Mrg to 1st Mrg lesion, 50  %stenosed.  Prox LAD to Mid LAD lesion, 40 %stenosed. 1. Mild to moderate diffuse nonobstructive coronary artery disease with the exception of tight stenosis in an RV marginal branch 2. Known moderate LV dysfunction by echo  Regulation: Continue secondary risk reduction measures. The patient does not require PCI. He likely has a nonischemic cardiomyopathy. He does have diffuse atherosclerosis and should be treated aggressively with medication.   11/17/15: TTE Study Conclusions - Left ventricle: The cavity size was mildly dilated. There was   mild concentric hypertrophy. Systolic function was moderately   reduced. The estimated ejection fraction was in the range of 35%   to 40%. Diffuse hypokinesis. Doppler parameters are consistent   with abnormal left ventricular relaxation (grade 1 diastolic   dysfunction). There was no evidence of elevated ventricular   filling pressure by Doppler parameters. - Aortic valve: There  was trivial regurgitation. - Aortic root: The aortic root was normal in size. - Mitral valve: Structurally normal valve. - Right ventricle: Systolic function was normal. - Right atrium: The atrium was normal in size. - Atrial septum: No defect or patent foramen ovale was identified. - Pulmonary arteries: Systolic pressure was within the normal   range. - Inferior vena cava: The vessel was normal in size. - Pericardium, extracardiac: There was no pericardial effusion. Impressions: - Negative bubble syudy. No evidence for ASD or PFO.  Recent Labs: 09/13/2016: ALT 26; BUN 13; Creat 1.05; Hemoglobin 13.2; Platelets 256; Potassium 3.7; Sodium 141; TSH 0.51  09/13/2016: Cholesterol 204; HDL 55; LDL Cholesterol 133; Total CHOL/HDL Ratio 3.7; Triglycerides 80; VLDL 16   CrCl cannot be calculated (Patient's most recent lab result is older than the maximum 21 days allowed.).   Wt Readings from Last 3 Encounters:  10/15/16 173 lb (78.5 kg)  09/27/16 171 lb (77.6 kg)  09/13/16  175 lb (79.4 kg)     Other studies reviewed: Additional studies/records reviewed today include: summarized above  ASSESSMENT AND PLAN:  1. Cryptogenic stroke w/ILR     no true AF noted to date     He has run out of ASA     Tachy detection was changed to 180bpm as instructed by Dr. Lovena Le  2. HTN,hypertensive heart disease     Remains uncontrolled, recheck is 142/90     He is bradycardic today will decrease his lopressor to 12.5mg  BID and increase his lisinopril to 40mg  daily  3. NICM     On BB/ACE     Weight is up, though exam is euvolemic, no symptoms to suggest fluid OL     Discussed minimizing sodium, daily weights  4. CAD (branch disease)      No anginal symptoms     He is back on his statin, will c/w his PMD, last labs done without statin     Resume ECASA at 81mg  daily     C/w Dr. Johnsie Cancel  5. Smoking     Counseled, he is actively working on this and seems motivated   Disposition: will see him back in 3 months, sooner if needed, plan for updated echo then    Current medicines are reviewed at length with the patient today.  The patient did not have any concerns regarding medicines.  Haywood Lasso, PA-C 12/17/2016 5:33 AM     [address] [place] [address] [phone] (office)  [phone] (fax)

## 2016-12-18 ENCOUNTER — Ambulatory Visit (INDEPENDENT_AMBULATORY_CARE_PROVIDER_SITE_OTHER): Payer: Self-pay | Admitting: Physician Assistant

## 2016-12-18 VITALS — BP 140/100 | HR 48 | Ht 71.0 in | Wt 177.0 lb

## 2016-12-18 DIAGNOSIS — I5020 Unspecified systolic (congestive) heart failure: Secondary | ICD-10-CM

## 2016-12-18 DIAGNOSIS — I110 Hypertensive heart disease with heart failure: Secondary | ICD-10-CM

## 2016-12-18 DIAGNOSIS — I2510 Atherosclerotic heart disease of native coronary artery without angina pectoris: Secondary | ICD-10-CM

## 2016-12-18 DIAGNOSIS — I428 Other cardiomyopathies: Secondary | ICD-10-CM

## 2016-12-18 DIAGNOSIS — R001 Bradycardia, unspecified: Secondary | ICD-10-CM

## 2016-12-18 MED ORDER — LISINOPRIL 40 MG PO TABS: 40.0000 mg | ORAL_TABLET | Freq: Every day | ORAL | 1 refills | Status: DC

## 2016-12-18 MED ORDER — METOPROLOL TARTRATE 25 MG PO TABS: 12.5000 mg | ORAL_TABLET | Freq: Two times a day (BID) | ORAL | 1 refills | Status: DC

## 2016-12-18 MED FILL — LISINOPRIL 40 MG TABLET: 40 | 30 days supply | Qty: 30 | Fill #0

## 2016-12-18 MED FILL — AMLODIPINE BESYLATE 2.5 MG: 2.5 | 30 days supply | Qty: 30 | Fill #0

## 2016-12-18 NOTE — Patient Instructions (Addendum)
Medication Instructions:   START TAKING ASPRIN 81 MG ONCE A DAY   START TAKING  LISINOPRIL  40 MG ONCE A DAY   START TAKING  METOPROLOL 12.5 MG TWICE A DAY   If you need a refill on your cardiac medications before your next appointment, please call your pharmacy.  Labwork: NONE ORDERED  TODAY   Testing/Procedures: NONE ORDERED  TODAY    Follow-Up:  IN 3 MONTHS WITH RENEE URSUY   Any Other Special Instructions Will Be Listed Below (If Applicable).

## 2016-12-30 ENCOUNTER — Encounter: Payer: Self-pay | Admitting: Family Medicine

## 2016-12-30 ENCOUNTER — Ambulatory Visit (INDEPENDENT_AMBULATORY_CARE_PROVIDER_SITE_OTHER): Payer: Self-pay | Admitting: *Deleted

## 2016-12-30 ENCOUNTER — Telehealth: Payer: Self-pay

## 2016-12-30 ENCOUNTER — Ambulatory Visit (INDEPENDENT_AMBULATORY_CARE_PROVIDER_SITE_OTHER): Payer: Self-pay | Admitting: Family Medicine

## 2016-12-30 VITALS — BP 126/80 | HR 55 | Temp 97.8°F | Resp 16 | Ht 71.0 in | Wt 175.0 lb

## 2016-12-30 DIAGNOSIS — I10 Essential (primary) hypertension: Secondary | ICD-10-CM

## 2016-12-30 DIAGNOSIS — R0602 Shortness of breath: Secondary | ICD-10-CM

## 2016-12-30 DIAGNOSIS — I639 Cerebral infarction, unspecified: Secondary | ICD-10-CM

## 2016-12-30 LAB — POCT URINALYSIS DIP (DEVICE)
Bilirubin Urine: NEGATIVE
GLUCOSE, UA: NEGATIVE mg/dL
KETONES UR: NEGATIVE mg/dL
Leukocytes, UA: NEGATIVE
Nitrite: NEGATIVE
PH: 6 (ref 5.0–8.0)
PROTEIN: NEGATIVE mg/dL
SPECIFIC GRAVITY, URINE: 1.01 (ref 1.005–1.030)
UROBILINOGEN UA: 1 mg/dL (ref 0.0–1.0)

## 2016-12-30 MED ORDER — AMLODIPINE BESYLATE 2.5 MG PO TABS: 2.5000 mg | ORAL_TABLET | Freq: Every day | ORAL | 1 refills | Status: DC

## 2016-12-30 MED ORDER — AMLODIPINE BESYLATE 10 MG PO TABS: 10.0000 mg | ORAL_TABLET | Freq: Every day | ORAL | 1 refills | Status: DC

## 2016-12-30 NOTE — Telephone Encounter (Signed)
Pharmacy notified of change in medication dosage

## 2016-12-30 NOTE — Patient Instructions (Signed)
Continue to take all medications as prescribed. Your blood pressure is well controlled today. Next visit, I will obtain fasting lab work. If you run out of medication prior to your next visit, please contact your pharmacy to request a medication refill. Continue to make efforts to stop smoking.     Hypertension Hypertension is another name for high blood pressure. High blood pressure forces your heart to work harder to pump blood. This can cause problems over time. There are two numbers in a blood pressure reading. There is a top number (systolic) over a bottom number (diastolic). It is best to have a blood pressure below 120/80. Healthy choices can help lower your blood pressure. You may need medicine to help lower your blood pressure if:  Your blood pressure cannot be lowered with healthy choices.  Your blood pressure is higher than 130/80.  Follow these instructions at home: Eating and drinking  If directed, follow the DASH eating plan. This diet includes: ? Filling half of your plate at each meal with fruits and vegetables. ? Filling one quarter of your plate at each meal with whole grains. Whole grains include whole wheat pasta, brown rice, and whole grain bread. ? Eating or drinking low-fat dairy products, such as skim milk or low-fat yogurt. ? Filling one quarter of your plate at each meal with low-fat (lean) proteins. Low-fat proteins include fish, skinless chicken, eggs, beans, and tofu. ? Avoiding fatty meat, cured and processed meat, or chicken with skin. ? Avoiding premade or processed food.  Eat less than 1,500 mg of salt (sodium) a day.  Limit alcohol use to no more than 1 drink a day for nonpregnant women and 2 drinks a day for men. One drink equals 12 oz of beer, 5 oz of wine, or 1 oz of hard liquor. Lifestyle  Work with your doctor to stay at a healthy weight or to lose weight. Ask your doctor what the best weight is for you.  Get at least 30 minutes of exercise that  causes your heart to beat faster (aerobic exercise) most days of the week. This may include walking, swimming, or biking.  Get at least 30 minutes of exercise that strengthens your muscles (resistance exercise) at least 3 days a week. This may include lifting weights or pilates.  Do not use any products that contain nicotine or tobacco. This includes cigarettes and e-cigarettes. If you need help quitting, ask your doctor.  Check your blood pressure at home as told by your doctor.  Keep all follow-up visits as told by your doctor. This is important. Medicines  Take over-the-counter and prescription medicines only as told by your doctor. Follow directions carefully.  Do not skip doses of blood pressure medicine. The medicine does not work as well if you skip doses. Skipping doses also puts you at risk for problems.  Ask your doctor about side effects or reactions to medicines that you should watch for. Contact a doctor if:  You think you are having a reaction to the medicine you are taking.  You have headaches that keep coming back (recurring).  You feel dizzy.  You have swelling in your ankles.  You have trouble with your vision. Get help right away if:  You get a very bad headache.  You start to feel confused.  You feel weak or numb.  You feel faint.  You get very bad pain in your: ? Chest. ? Belly (abdomen).  You throw up (vomit) more than once.  You  have trouble breathing. Summary  Hypertension is another name for high blood pressure.  Making healthy choices can help lower blood pressure. If your blood pressure cannot be controlled with healthy choices, you may need to take medicine. This information is not intended to replace advice given to you by your health care provider. Make sure you discuss any questions you have with your health care provider. Document Released: 10/30/2007 Document Revised: 04/10/2016 Document Reviewed: 04/10/2016 Elsevier Interactive  Patient Education  [employer].

## 2016-12-30 NOTE — Telephone Encounter (Signed)
-----   Message from [name], Leslie sent at 12/30/2016  2:32 PM EDT ----- Please call over the [facility] and cancel the amlodipine 2.5 mg and patient should be taking amlodipine 10 mg. I've corrected in Epic.

## 2016-12-30 NOTE — Progress Notes (Signed)
Patient ID: Gregory Perry, male    DOB: 09/22/1950, 66 y.o.   MRN: [id number]  PCP: [name], FNP  Chief Complaint  Patient presents with  . Follow-up    3 month    Subjective:  HPI Gregory Perry is a 66 y.o. male presents for evaluation of hypertension.  [name]'s medical history is significant for CVA, cardiomyopathy, tobacco use, EF 35%, hypertension. [name] reports that he has been well overall. He occasionally experiences shortness of breath with strenuous activity. Shortness of breath resolves with rest. He denies associated chest pain, dizziness, headache or fatigue.  [name] had a recent follow-up with cardiology, in which his blood pressure was not controlled and he was bradycardiac. During his cardiology visit on 12/18/2016, metoprolol was decreased to 12.5 mg BID and lisinopril was increased to 40 mg. [name] reports compliance with medication.  Social History   Social History  . Marital status: Widowed    Spouse name: N/A  . Number of children: N/A  . Years of education: N/A   Occupational History  . Not on file.   Social History Main Topics  . Smoking status: Current Every Day Smoker    Packs/day: 0.50    Years: 30.00    Types: Cigarettes  . Smokeless tobacco: Never Used     Comment: Pt smokes 6 to 7 a day trying to quit  . Alcohol use No     Comment: quit in 10/2015   . Drug use: Yes    Types: Marijuana     Comment: occ  . Sexual activity: Not on file   Other Topics Concern  . Not on file   Social History Narrative  . No narrative on file    Family History  Problem Relation Age of Onset  . Heart attack Maternal Grandfather    Review of Systems See HPI Patient Active Problem List   Diagnosis Date Noted  . Abnormal nuclear stress test 01/17/2016  . Candida esophagitis ([place])   . Pre-operative cardiovascular examination, LVEF < 35% 11/20/2015  . Cryptogenic stroke (Gregory Perry) 11/17/2015  . Essential hypertension 11/17/2015  . GERD  (gastroesophageal reflux disease) 11/17/2015  . Loss of weight 11/17/2015  . Tobacco abuse 11/17/2015  . Dysphagia 11/17/2015  . Protein-calorie malnutrition, severe 11/17/2015  . Cerebral infarction due to thrombosis of right posterior cerebral artery (Gregory Perry)   . History of stroke   . Cardiomyopathy, ischemic   . Esophageal stricture   . Dysphasia 05/07/2011    Not on File  Prior to Admission medications   Medication Sig Start Date End Date Taking? Authorizing Provider  amLODipine (NORVASC) 2.5 MG tablet TAKE 1 TABLET BY MOUTH DAILY. 12/18/16  Yes [name], FNP  aspirin EC 81 MG tablet Take 81 mg by mouth daily.   Yes [provider]  atorvastatin (LIPITOR) 40 MG tablet Take 1 tablet (40 mg total) by mouth daily at 6 PM. 10/18/16  Yes [name], FNP  Blood Pressure Monitoring (BLOOD PRESSURE CUFF) MISC Check blood pressure daily between 8 pm -9 pm 09/13/16  Yes [name], FNP  lisinopril (PRINIVIL,ZESTRIL) 40 MG tablet Take 1 tablet (40 mg total) by mouth daily. 12/18/16  Yes [name], PA-C  metoprolol tartrate (LOPRESSOR) 25 MG tablet Take 0.5 tablets (12.5 mg total) by mouth 2 (two) times daily. 12/18/16  Yes [name], PA-C  pantoprazole (PROTONIX) 40 MG tablet Take 1 tablet (40 mg total) by mouth 2 (two) times daily  before a meal. 10/18/16  Yes [name], FNP  amLODipine (NORVASC) 10 MG tablet Take 1 tablet (10 mg total) by mouth daily. Patient not taking: Reported on 12/30/2016 10/18/16   [name], FNP   Past Medical, Surgical Family and Social History reviewed and updated.   Objective:   Today's Vitals   12/30/16 0840  BP: 126/80  Pulse: (!) 55  Resp: 16  Temp: 97.8 F (36.6 C)  TempSrc: Oral  SpO2: 100%  Weight: 175 lb (79.4 kg)  Height: 5\' 11"  (1.803 m)    Wt Readings from Last 3 Encounters:  12/30/16 175 lb (79.4 kg)  12/18/16 177 lb (80.3 kg)  10/15/16 173 lb (78.5 kg)   Physical Exam   Constitutional: He is oriented to person, place, and time. He appears well-developed and well-nourished.  HENT:  Head: Normocephalic and atraumatic.  Eyes: Pupils are equal, round, and reactive to light. Conjunctivae and EOM are normal.  Neck: Normal range of motion. Neck supple. No thyromegaly present.  Cardiovascular: Normal heart sounds and intact distal pulses.  Bradycardia present.   Pulmonary/Chest: Effort normal. He has no decreased breath sounds. He has no wheezes. He has no rhonchi. He has no rales.  Musculoskeletal: Normal range of motion.  Neurological: He is alert and oriented to person, place, and time.  Skin: Skin is warm and dry.  Psychiatric: He has a normal mood and affect. His behavior is normal. Judgment and thought content normal.   Assessment & Plan:  1. Essential hypertension, stable /controlled today -POCT urinalysis dip (device) -Continue amlodipine 10 MG tablet, lisinopril 40 mg, and metoprolol 12.5 mg twice daily.   2. Shortness of breath, chronic  -Encouraged smoking cessation. -Patient is scheduled for an upcoming ECHO in October, order per cardiology.    RTC: 4 months for hypertension follow-up. If symptoms worsen, return for care sooner.   Carroll Sage. Gregory Kingfisher, MSN, FNP-C [facility]  [address] [place], [place] [address] [phone]

## 2016-12-31 NOTE — Progress Notes (Signed)
Carelink Summary Report / Loop Recorder 

## 2017-01-08 LAB — CUP PACEART REMOTE DEVICE CHECK
Implantable Pulse Generator Implant Date: 20170623
MDC IDC SESS DTM: 20180806003948

## 2017-01-09 ENCOUNTER — Other Ambulatory Visit: Payer: Self-pay | Admitting: Family Medicine

## 2017-01-09 MED FILL — METOPROLOL TARTRATE 25 MG T: 25 | 30 days supply | Qty: 60 | Fill #2

## 2017-01-14 ENCOUNTER — Other Ambulatory Visit: Payer: Self-pay | Admitting: Family Medicine

## 2017-01-14 DIAGNOSIS — I10 Essential (primary) hypertension: Secondary | ICD-10-CM

## 2017-01-14 MED FILL — AMLODIPINE BESYLATE 2.5 MG: 2.5 | 30 days supply | Qty: 30 | Fill #0

## 2017-01-14 MED FILL — ?PANTOPRAZOLE SOD DR 40MG: 40 MG | 30 days supply | Qty: 60 | Fill #3

## 2017-01-21 MED FILL — LISINOPRIL 40 MG TABLET: 40 | 30 days supply | Qty: 30 | Fill #1

## 2017-01-21 MED FILL — ?ATORVASTATIN 40MG TABLET: 40 | 30 days supply | Qty: 30 | Fill #3

## 2017-01-28 ENCOUNTER — Ambulatory Visit (INDEPENDENT_AMBULATORY_CARE_PROVIDER_SITE_OTHER): Payer: Self-pay | Admitting: *Deleted

## 2017-01-28 DIAGNOSIS — I639 Cerebral infarction, unspecified: Secondary | ICD-10-CM

## 2017-01-30 NOTE — Progress Notes (Signed)
Carelink Summary Report / Loop Recorder 

## 2017-02-02 LAB — CUP PACEART REMOTE DEVICE CHECK
Date Time Interrogation Session: 20180905014309
Implantable Pulse Generator Implant Date: 20170623

## 2017-02-07 ENCOUNTER — Other Ambulatory Visit: Payer: Self-pay | Admitting: Internal Medicine

## 2017-02-10 ENCOUNTER — Other Ambulatory Visit: Payer: Self-pay | Admitting: Family Medicine

## 2017-02-10 DIAGNOSIS — I10 Essential (primary) hypertension: Secondary | ICD-10-CM

## 2017-02-10 MED FILL — ?METOPROLOL 25 MG TABLET: 25 | 30 days supply | Qty: 60 | Fill #0

## 2017-02-10 MED FILL — ?METOPROLOL 25 MG TABLET: 25 | 30 days supply | Qty: 60 | Fill #1

## 2017-02-10 MED FILL — AMLODIPINE BESYLATE 2.5 MG: 2.5 | 30 days supply | Qty: 30 | Fill #0

## 2017-02-17 MED FILL — ?PANTOPRAZOLE SOD DR 40MG: 40 MG | 15 days supply | Qty: 30 | Fill #4

## 2017-02-19 MED FILL — LISINOPRIL 40 MG TABLET: 40 | 30 days supply | Qty: 30 | Fill #2

## 2017-02-24 MED FILL — ?ATORVASTATIN 40MG TABLET: 40 | 30 days supply | Qty: 30 | Fill #4

## 2017-02-27 ENCOUNTER — Ambulatory Visit (INDEPENDENT_AMBULATORY_CARE_PROVIDER_SITE_OTHER): Payer: Self-pay | Admitting: *Deleted

## 2017-02-27 DIAGNOSIS — I639 Cerebral infarction, unspecified: Secondary | ICD-10-CM

## 2017-02-28 LAB — CUP PACEART REMOTE DEVICE CHECK
MDC IDC PG IMPLANT DT: 20170623
MDC IDC SESS DTM: 20181005013845

## 2017-02-28 NOTE — Progress Notes (Signed)
Loop recorder summary report 

## 2017-03-10 ENCOUNTER — Other Ambulatory Visit: Payer: Self-pay | Admitting: Family Medicine

## 2017-03-10 DIAGNOSIS — I10 Essential (primary) hypertension: Secondary | ICD-10-CM

## 2017-03-10 MED FILL — ?METOPROLOL 25 MG TABLET: 25 | 30 days supply | Qty: 60 | Fill #2

## 2017-03-10 MED FILL — AMLODIPINE BESYLATE 2.5 MG: 2.5 | 30 days supply | Qty: 30 | Fill #0

## 2017-03-10 MED FILL — ?PANTOPRAZOLE SOD DR 40MG: 40 MG | 30 days supply | Qty: 60 | Fill #0

## 2017-03-24 MED FILL — ?ATORVASTATIN 40MG TABLET: 40 | 30 days supply | Qty: 30 | Fill #5

## 2017-03-24 MED FILL — LISINOPRIL 40 MG TABLET: 40 | 30 days supply | Qty: 30 | Fill #3

## 2017-03-31 ENCOUNTER — Ambulatory Visit (INDEPENDENT_AMBULATORY_CARE_PROVIDER_SITE_OTHER): Payer: Self-pay | Admitting: *Deleted

## 2017-03-31 DIAGNOSIS — I639 Cerebral infarction, unspecified: Secondary | ICD-10-CM

## 2017-04-01 NOTE — Progress Notes (Signed)
Carelink Summary Report / Loop Recorder 

## 2017-04-03 LAB — CUP PACEART REMOTE DEVICE CHECK
Implantable Pulse Generator Implant Date: 20170623
MDC IDC SESS DTM: 20181104014559

## 2017-04-09 MED FILL — ?PANTOPRAZOLE SOD DR 40MG: 40 MG | 30 days supply | Qty: 60 | Fill #1

## 2017-04-21 MED FILL — AMLODIPINE BESYLATE 2.5 MG: 2.5 | 30 days supply | Qty: 30 | Fill #1

## 2017-04-21 MED FILL — LISINOPRIL 40 MG TAB: 40 | 30 days supply | Qty: 30 | Fill #4

## 2017-04-28 ENCOUNTER — Ambulatory Visit (INDEPENDENT_AMBULATORY_CARE_PROVIDER_SITE_OTHER): Payer: Self-pay | Admitting: *Deleted

## 2017-04-28 DIAGNOSIS — I639 Cerebral infarction, unspecified: Secondary | ICD-10-CM

## 2017-04-28 MED FILL — ATORVASTATIN 40 MG TABLET: 40 | 30 days supply | Qty: 30 | Fill #6

## 2017-04-29 NOTE — Progress Notes (Signed)
Carelink Summary Report / Loop Recorder 

## 2017-05-02 ENCOUNTER — Ambulatory Visit: Payer: Self-pay | Admitting: Family Medicine

## 2017-05-05 NOTE — Progress Notes (Signed)
Cardiology Office Note Date:  05/08/2017  Patient ID:  Gregory Perry, Gregory Perry 02/17/1951, MRN [id number] PCP:  [name], FNP  Cardiologist:  Dr. Johnsie Cancel  Electrophysiologist: Dr. Lovena Le   Chief Complaint: planned f/u  History of Present Illness: Gregory Perry is a 66 y.o. male with history of cryptogenic stroke w/ILR, NICM, CAD w/branch vessel disease, chronic CHF (systolic), HTN.  He comes in today to be seen for Dr. Lovena Le.  He was last seenby EP service w/Amber Lynnell Jude, NP in Sept 2017, he was s/p LHC at that time with branch vessel disease to be treated medically/risk modification, he had not been found to have any AF by his ILR up to that visit.   He was seen by myself in May, his BP was high, he had been without his medicines for about 2 weeks or so, back on 7-10 days.  This was a financial issue, but had changed pharmacy and is much better for him with lower cost.    He had f/u again by myself in July, he denied any CP or palpitations, no SOB or exertional intolerances.  He could push mow his lawn and does his yard work without difficulty.  He has not had any dizziness, no near syncope or syncope.  He reported taking his medicines as prescribed and daily, without coverage/insurance issues monitoring his BP at home getting typically about 160/80 some days a little lower and higher.  His lopressor was reduced 2/2 bradycardia and his lisinopril increased, and advised to continue to monitor his BP, and discussed updating his echo after next visit.  He comes today for follow up.  He is feeling well.  Denies any kind of CP, palpitations or SOB with his routine activities.  He denies symptoms of PND or orthopnea.  He will get a little winded with heavier activities but prior to cold weather was still push mowing his yards and busy doing things in and out of his house.  He did not do any shoveling.  He has been having episodes where he feels like his BP is high with a "rushing sensation"  in his ears/head, he is following this with his PMD.  He continues to smoke but is actively try to quit and continues to cut back.  He denies any bloating or swelling.  Device information: MDT ILR, implanted 11/17/15, Dr. Lovena Le, Cryptogenic stroke Hx of false AF detections for PACs/PVCs April had 1 SVT   Past Medical History:  Diagnosis Date  . Alcohol abuse   . Candida esophagitis ([place])   . Cardiomyopathy ([place])   . CVA (cerebral infarction)   . Duodenal ulcer   . Dysphagia   . Headache(784.0)    history of  . Hypertension   . Severe protein-calorie malnutrition ([place])   . Stroke [facility])     Past Surgical History:  Procedure Laterality Date  . BALLOON DILATION N/A 11/22/2015   Procedure: BALLOON DILATION;  Surgeon: Mauri Pole, MD;  Location: [place] [facility];  Service: Endoscopy;  Laterality: N/A;  . CARDIAC CATHETERIZATION N/A 01/17/2016   Procedure: Left Heart Cath and Coronary Angiography;  Surgeon: Sherren Mocha, MD;  Location: [place] [facility];  Service: Cardiovascular;  Laterality: N/A;  . DIRECT LARYNGOSCOPY  05/07/2011   Procedure: DIRECT LARYNGOSCOPY;  Surgeon: Rozetta Nunnery, MD;  Location: [place];  Service: ENT;  Laterality: N/A;  ESOPHAGOSCOPY  WITH DILATION  . EP IMPLANTABLE DEVICE N/A 11/17/2015   Procedure: Loop Recorder Insertion;  Surgeon: Carleene Overlie  Peyton Najjar, MD;  Location: [place] CV LAB;  Service: Cardiovascular;  Laterality: N/A;  . ESOPHAGOGASTRODUODENOSCOPY  04/29/2011   Procedure: ESOPHAGOGASTRODUODENOSCOPY (EGD);  Surgeon: Winfield Cunas., MD;  Location: [facility];  Service: Endoscopy;  Laterality: N/A;  . ESOPHAGOGASTRODUODENOSCOPY N/A 11/22/2015   Procedure: ESOPHAGOGASTRODUODENOSCOPY (EGD);  Surgeon: Mauri Pole, MD;  Location[facility];  Service: Endoscopy;  Laterality: N/A;  . LOOP RECORDER IMPLANT      Current Outpatient Medications  Medication Sig Dispense Refill  . amLODipine (NORVASC) 2.5 MG tablet TAKE 1 TABLET BY MOUTH  DAILY. 30 tablet 3  . atorvastatin (LIPITOR) 40 MG tablet Take 1 tablet (40 mg total) by mouth daily at 6 PM. 90 tablet 2  . Blood Pressure Monitoring (BLOOD PRESSURE CUFF) MISC Check blood pressure daily between 8 pm -9 pm 1 each 0  . lisinopril (PRINIVIL,ZESTRIL) 40 MG tablet Take 1 tablet (40 mg total) by mouth daily. 90 tablet 1  . metoprolol tartrate (LOPRESSOR) 25 MG tablet Take 1 tablet (25 mg total) by mouth 2 (two) times daily. 90 tablet 1  . pantoprazole (PROTONIX) 40 MG tablet TAKE 1 TABLET BY MOUTH 2 TIMES DAILY BEFORE A MEAL. 90 tablet 2  . aspirin EC 81 MG tablet Take 81 mg by mouth daily.     No current facility-administered medications for this visit.     Allergies:   Patient has no known allergies.   Social History:  The patient  reports that he has been smoking cigarettes.  He has a 15.00 pack-year smoking history. he has never used smokeless tobacco. He reports that he uses drugs. Drug: Marijuana. He reports that he does not drink alcohol.   Family History:  The patient's family history includes Heart attack in his maternal grandfather.  ROS:  Please see the history of present illness.  All other systems are reviewed and otherwise negative.   PHYSICAL EXAM:  VS:  BP 118/72   Pulse (!) 54   Resp 16   Ht 5\' 11"  (1.803 m)   Wt 189 lb (85.7 kg)   SpO2 99%   BMI 26.36 kg/m  BMI: Body mass index is 26.36 kg/m. Well nourished, though thin,  well developed, in no acute distress  HEENT: normocephalic, atraumatic  Neck: no JVD, carotid bruits or masses Cardiac:  RRR; bradycardic, no significant murmurs, no rubs, or gallops Lungs:   CTA b/l, no wheezing, rhonchi or rales  Abd: soft, nontender MS: no deformity or atrophy Ext:  no edema  Skin: warm and dry, no rash Neuro:  No gross deficits appreciated Psych: euthymic mood, full affect  ILR site is stable, no tethering or discomfort   EKG:  12/18/16: SB, 48bpm, 1st degree AVblock, PR 225ms, QRS 57ms, QTc 379ms ILR  interrogation done today by industry and reviewed by myself: battery is good, R wave 0.8, SB 55bpm today, no observations, no AF  Historically AF episodes have been felt to be SR with PACs, no  Brady events, tachy events have gradual onset and recovery, HR today 48-50bpm  01/17/16: LHC Conclusion   Prox RCA to Mid RCA lesion, 50 %stenosed.  Mid RCA to Dist RCA lesion, 40 %stenosed.  Acute Mrg lesion, 90 %stenosed.  Ost 1st Mrg to 1st Mrg lesion, 50 %stenosed.  Prox LAD to Mid LAD lesion, 40 %stenosed. 1. Mild to moderate diffuse nonobstructive coronary artery disease with the exception of tight stenosis in an RV marginal branch 2. Known moderate LV dysfunction by echo  Regulation: Continue secondary risk reduction measures. The patient does not require PCI. He likely has a nonischemic cardiomyopathy. He does have diffuse atherosclerosis and should be treated aggressively with medication.   11/17/15: TTE Study Conclusions - Left ventricle: The cavity size was mildly dilated. There was   mild concentric hypertrophy. Systolic function was moderately   reduced. The estimated ejection fraction was in the range of 35%   to 40%. Diffuse hypokinesis. Doppler parameters are consistent   with abnormal left ventricular relaxation (grade 1 diastolic   dysfunction). There was no evidence of elevated ventricular   filling pressure by Doppler parameters. - Aortic valve: There was trivial regurgitation. - Aortic root: The aortic root was normal in size. - Mitral valve: Structurally normal valve. - Right ventricle: Systolic function was normal. - Right atrium: The atrium was normal in size. - Atrial septum: No defect or patent foramen ovale was identified. - Pulmonary arteries: Systolic pressure was within the normal   range. - Inferior vena cava: The vessel was normal in size. - Pericardium, extracardiac: There was no pericardial effusion. Impressions: - Negative bubble syudy. No evidence for  ASD or PFO.  Recent Labs: 05/07/2017: ALT 22; BUN 12; Creatinine, Ser 1.21; Hemoglobin 12.7; Platelets 276; Potassium 3.9; Sodium 144; TSH 1.020  09/13/2016: VLDL 16 05/07/2017: Chol/HDL Ratio 2.9; Cholesterol, Total 137; HDL 48; LDL Calculated 68; Triglycerides 105   Estimated Creatinine Clearance: 65.7 mL/min (by C-G formula based on SCr of 1.21 mg/dL).   Wt Readings from Last 3 Encounters:  05/08/17 189 lb (85.7 kg)  05/07/17 187 lb 12.8 oz (85.2 kg)  12/30/16 175 lb (79.4 kg)     Other studies reviewed: Additional studies/records reviewed today include: summarized above  ASSESSMENT AND PLAN:  1. Cryptogenic stroke w/ILR      no true AF noted to date  2. HTN,hypertensive heart disease     Looks good today, he is following with his PMD his BP  3. NICM, last EF 35-40%     On BB/ACE     Weight is up, though exam appears euvolemic, no symptoms to suggest fluid OL, wearing heavier winter clothes today     Re-discussed minimizing sodium, daily weights, and importance of his medicines  4. CAD (branch disease)      No anginal symptoms     He is on his statin, will c/w his PMD, looks better     On BB/ASA/statin tx     C/w Dr. Johnsie Cancel  5. Smoking     Re-counseled, he reports again is actively working on this and seems motivated   Disposition: will get an echo given he reports he has been able to get his medicines and taking them daily as directed, otherwise, 6 months with cardiology, sooner if needed, 1 year with EP, continue monthly ILR transmissions   Current medicines are reviewed at length with the patient today.  The patient did not have any concerns regarding medicines.  Haywood Lasso, PA-C 05/08/2017 11:37 AM     [address] [place] [address] [phone] (office)  [phone] (fax)

## 2017-05-06 LAB — CUP PACEART REMOTE DEVICE CHECK
Implantable Pulse Generator Implant Date: 20170623
MDC IDC SESS DTM: 20181204023945

## 2017-05-07 ENCOUNTER — Encounter: Payer: Self-pay | Admitting: Family Medicine

## 2017-05-07 ENCOUNTER — Ambulatory Visit (INDEPENDENT_AMBULATORY_CARE_PROVIDER_SITE_OTHER): Payer: Self-pay | Admitting: Family Medicine

## 2017-05-07 VITALS — BP 140/80 | HR 69 | Temp 98.1°F | Resp 16 | Wt 187.8 lb

## 2017-05-07 DIAGNOSIS — Z13220 Encounter for screening for lipoid disorders: Secondary | ICD-10-CM

## 2017-05-07 DIAGNOSIS — Z1329 Encounter for screening for other suspected endocrine disorder: Secondary | ICD-10-CM

## 2017-05-07 DIAGNOSIS — I10 Essential (primary) hypertension: Secondary | ICD-10-CM

## 2017-05-07 MED ORDER — METOPROLOL TARTRATE 25 MG PO TABS: 25.0000 mg | ORAL_TABLET | Freq: Two times a day (BID) | ORAL | 1 refills | Status: DC

## 2017-05-07 MED ORDER — LISINOPRIL 40 MG PO TABS: 40.0000 mg | ORAL_TABLET | Freq: Every day | ORAL | 1 refills | Status: DC

## 2017-05-07 NOTE — Patient Instructions (Signed)
Coping with Quitting Smoking Quitting smoking is a physical and mental challenge. You will face cravings, withdrawal symptoms, and temptation. Before quitting, work with your health care provider to make a plan that can help you cope. Preparation can help you quit and keep you from giving in. How can I cope with cravings? Cravings usually last for 5-10 minutes. If you get through it, the craving will pass. Consider taking the following actions to help you cope with cravings:  Keep your mouth busy: ? Chew sugar-free gum. ? Suck on hard candies or a straw. ? Brush your teeth.  Keep your hands and body busy: ? Immediately change to a different activity when you feel a craving. ? Squeeze or play with a ball. ? Do an activity or a hobby, like making bead jewelry, practicing needlepoint, or working with wood. ? Mix up your normal routine. ? Take a short exercise break. Go for a quick walk or run up and down stairs. ? Spend time in public places where smoking is not allowed.  Focus on doing something kind or helpful for someone else.  Call a friend or family member to talk during a craving.  Join a support group.  Call a quit line, such as 1-800-QUIT-NOW.  Talk with your health care provider about medicines that might help you cope with cravings and make quitting easier for you.  How can I deal with withdrawal symptoms? Your body may experience negative effects as it tries to get used to not having nicotine in the system. These effects are called withdrawal symptoms. They may include:  Feeling hungrier than normal.  Trouble concentrating.  Irritability.  Trouble sleeping.  Feeling depressed.  Restlessness and agitation.  Craving a cigarette.  To manage withdrawal symptoms:  Avoid places, people, and activities that trigger your cravings.  Remember why you want to quit.  Get plenty of sleep.  Avoid coffee and other caffeinated drinks. These may worsen some of your  symptoms.  How can I handle social situations? Social situations can be difficult when you are quitting smoking, especially in the first few weeks. To manage this, you can:  Avoid parties, bars, and other social situations where people might be smoking.  Avoid alcohol.  Leave right away if you have the urge to smoke.  Explain to your family and friends that you are quitting smoking. Ask for understanding and support.  Plan activities with friends or family where smoking is not an option.  What are some ways I can cope with stress? Wanting to smoke may cause stress, and stress can make you want to smoke. Find ways to manage your stress. Relaxation techniques can help. For example:  Breathe slowly and deeply, in through your nose and out through your mouth.  Listen to soothing, relaxing music.  Talk with a family member or friend about your stress.  Light a candle.  Soak in a bath or take a shower.  Think about a peaceful place.  What are some ways I can prevent weight gain? Be aware that many people gain weight after they quit smoking. However, not everyone does. To keep from gaining weight, have a plan in place before you quit and stick to the plan after you quit. Your plan should include:  Having healthy snacks. When you have a craving, it may help to: ? Eat plain popcorn, crunchy carrots, celery, or other cut vegetables. ? Chew sugar-free gum.  Changing how you eat: ? Eat small portion sizes at meals. ?   Eat 4-6 small meals throughout the day instead of 1-2 large meals a day. ? Be mindful when you eat. Do not watch television or do other things that might distract you as you eat.  Exercising regularly: ? Make time to exercise each day. If you do not have time for a long workout, do short bouts of exercise for 5-10 minutes several times a day. ? Do some form of strengthening exercise, like weight lifting, and some form of aerobic exercise, like running or  swimming.  Drinking plenty of water or other low-calorie or no-calorie drinks. Drink 6-8 glasses of water daily, or as much as instructed by your health care provider.  Summary  Quitting smoking is a physical and mental challenge. You will face cravings, withdrawal symptoms, and temptation to smoke again. Preparation can help you as you go through these challenges.  You can cope with cravings by keeping your mouth busy (such as by chewing gum), keeping your body and hands busy, and making calls to family, friends, or a helpline for people who want to quit smoking.  You can cope with withdrawal symptoms by avoiding places where people smoke, avoiding drinks with caffeine, and getting plenty of rest.  Ask your health care provider about the different ways to prevent weight gain, avoid stress, and handle social situations. This information is not intended to replace advice given to you by your health care provider. Make sure you discuss any questions you have with your health care provider. Document Released: 05/10/2016 Document Revised: 05/10/2016 Document Reviewed: 05/10/2016 Elsevier Interactive Patient Education  2018 [employer]. Hypertension Hypertension is another name for high blood pressure. High blood pressure forces your heart to work harder to pump blood. This can cause problems over time. There are two numbers in a blood pressure reading. There is a top number (systolic) over a bottom number (diastolic). It is best to have a blood pressure below 120/80. Healthy choices can help lower your blood pressure. You may need medicine to help lower your blood pressure if:  Your blood pressure cannot be lowered with healthy choices.  Your blood pressure is higher than 130/80.  Follow these instructions at home: Eating and drinking  If directed, follow the DASH eating plan. This diet includes: ? Filling half of your plate at each meal with fruits and vegetables. ? Filling one quarter of  your plate at each meal with whole grains. Whole grains include whole wheat pasta, brown rice, and whole grain bread. ? Eating or drinking low-fat dairy products, such as skim milk or low-fat yogurt. ? Filling one quarter of your plate at each meal with low-fat (lean) proteins. Low-fat proteins include fish, skinless chicken, eggs, beans, and tofu. ? Avoiding fatty meat, cured and processed meat, or chicken with skin. ? Avoiding premade or processed food.  Eat less than 1,500 mg of salt (sodium) a day.  Limit alcohol use to no more than 1 drink a day for nonpregnant women and 2 drinks a day for men. One drink equals 12 oz of beer, 5 oz of wine, or 1 oz of hard liquor. Lifestyle  Work with your doctor to stay at a healthy weight or to lose weight. Ask your doctor what the best weight is for you.  Get at least 30 minutes of exercise that causes your heart to beat faster (aerobic exercise) most days of the week. This may include walking, swimming, or biking.  Get at least 30 minutes of exercise that strengthens your muscles (resistance  exercise) at least 3 days a week. This may include lifting weights or pilates.  Do not use any products that contain nicotine or tobacco. This includes cigarettes and e-cigarettes. If you need help quitting, ask your doctor.  Check your blood pressure at home as told by your doctor.  Keep all follow-up visits as told by your doctor. This is important. Medicines  Take over-the-counter and prescription medicines only as told by your doctor. Follow directions carefully.  Do not skip doses of blood pressure medicine. The medicine does not work as well if you skip doses. Skipping doses also puts you at risk for problems.  Ask your doctor about side effects or reactions to medicines that you should watch for. Contact a doctor if:  You think you are having a reaction to the medicine you are taking.  You have headaches that keep coming back (recurring).  You  feel dizzy.  You have swelling in your ankles.  You have trouble with your vision. Get help right away if:  You get a very bad headache.  You start to feel confused.  You feel weak or numb.  You feel faint.  You get very bad pain in your: ? Chest. ? Belly (abdomen).  You throw up (vomit) more than once.  You have trouble breathing. Summary  Hypertension is another name for high blood pressure.  Making healthy choices can help lower blood pressure. If your blood pressure cannot be controlled with healthy choices, you may need to take medicine. This information is not intended to replace advice given to you by your health care provider. Make sure you discuss any questions you have with your health care provider. Document Released: 10/30/2007 Document Revised: 04/10/2016 Document Reviewed: 04/10/2016 Elsevier Interactive Patient Education  [employer].

## 2017-05-07 NOTE — Progress Notes (Signed)
Patient ID: Gregory Perry, male    DOB: February 20, 1951, 66 y.o.   MRN: [id number]  PCP: [name], FNP  Chief Complaint  Patient presents with  . Hypertension    Subjective:  HPI Gregory Perry is a 66 y.o. male presents for evaluation of hypertension follow-up.  medical history is significant for CVA, cardiomyopathy, tobacco use, EF 35%, hypertension. Wyland reports overall good health since his last follow-up in August. He is routinely monitoring his blood pressure at home and reports readings ranging as low 130/100 less than 150/100. He is compliant with medications and has his medication bottles with him today at office visit. He reports continued episodes of shortness of breath with exertional activities only such as mowing the lawn or climbing stairs. Shortness of breath resolves with rest. Admits to continued tobacco use with an average of 10 cigarettes per day. Zakry continues to report an occasional headache which resolves without medication. Headaches are generalized. Denies associated dizziness, chest pain, shortness of breath, headache of fatigue. He is scheduled for follow-up with cardiology tomorrow.  Social History   Socioeconomic History  . Marital status: Widowed    Spouse name: Not on file  . Number of children: Not on file  . Years of education: Not on file  . Highest education level: Not on file  Social Needs  . Financial resource strain: Not on file  . Food insecurity - worry: Not on file  . Food insecurity - inability: Not on file  . Transportation needs - medical: Not on file  . Transportation needs - non-medical: Not on file  Occupational History  . Not on file  Tobacco Use  . Smoking status: Current Every Day Smoker    Packs/day: 0.50    Years: 30.00    Pack years: 15.00    Types: Cigarettes  . Smokeless tobacco: Never Used  . Tobacco comment: Pt smokes 6 to 7 a day trying to quit  Substance and Sexual Activity  . Alcohol use: No     Alcohol/week: 1.8 oz    Types: 3 Shots of liquor per week    Comment: quit in 10/2015   . Drug use: Yes    Types: Marijuana    Comment: occ  . Sexual activity: Not on file  Other Topics Concern  . Not on file  Social History Narrative  . Not on file    Family History  Problem Relation Age of Onset  . Heart attack Maternal Grandfather    Review of Systems  Constitutional: Negative.   Respiratory: Positive for shortness of breath.   Cardiovascular: Negative.   Gastrointestinal: Negative.   Genitourinary: Negative.   Neurological: Negative.   Psychiatric/Behavioral: Negative.     Patient Active Problem List   Diagnosis Date Noted  . Abnormal nuclear stress test 01/17/2016  . Candida esophagitis ([place])   . Pre-operative cardiovascular examination, LVEF < 35% 11/20/2015  . Cryptogenic stroke ([place]) 11/17/2015  . Essential hypertension 11/17/2015  . GERD (gastroesophageal reflux disease) 11/17/2015  . Loss of weight 11/17/2015  . Tobacco abuse 11/17/2015  . Dysphagia 11/17/2015  . Protein-calorie malnutrition, severe 11/17/2015  . Cerebral infarction due to thrombosis of right posterior cerebral artery ([place])   . History of stroke   . Cardiomyopathy, ischemic   . Esophageal stricture   . Dysphasia 05/07/2011    Not on File  Prior to Admission medications   Medication Sig Start Date End Date Taking? Authorizing Provider  amLODipine (NORVASC) 2.5 MG  tablet TAKE 1 TABLET BY MOUTH DAILY. 03/10/17   [name], FNP  aspirin EC 81 MG tablet Take 81 mg by mouth daily.    [provider]  atorvastatin (LIPITOR) 40 MG tablet Take 1 tablet (40 mg total) by mouth daily at 6 PM. 10/18/16   [name], FNP  Blood Pressure Monitoring (BLOOD PRESSURE CUFF) MISC Check blood pressure daily between 8 pm -9 pm 09/13/16   [name], FNP  pantoprazole (PROTONIX) 40 MG tablet TAKE 1 TABLET BY MOUTH 2 TIMES DAILY BEFORE A MEAL. 03/10/17   [name], FNP     Past Medical, Surgical Family and Social History reviewed and updated.    Objective:   Today's Vitals   05/07/17 0904 05/07/17 0942  BP: (!) 160/100 140/80  Pulse: 69   Resp: 16   Temp: 98.1 F (36.7 C)   SpO2: 98%   Weight: 187 lb 12.8 oz (85.2 kg)     Wt Readings from Last 3 Encounters:  05/07/17 187 lb 12.8 oz (85.2 kg)  12/30/16 175 lb (79.4 kg)  12/18/16 177 lb (80.3 kg)    Physical Exam Physical Exam: Constitutional: Patient appears well-developed and well-nourished. No distress. HENT: Normocephalic, atraumatic, External right and left ear normal. Oropharynx is clear and moist.  Eyes: Conjunctivae and EOM are normal. PERRLA, no scleral icterus. Neck: Normal ROM. Neck supple. No JVD. No tracheal deviation. No thyromegaly. CVS: RRR, S1/S2 +, no murmurs, no gallops, no carotid bruit.  Pulmonary: Effort and breath sounds normal, no stridor, rhonchi, wheezes, rales.  Abdominal: Soft. BS +, no distension, tenderness, rebound or guarding.  Musculoskeletal: Normal range of motion. No edema and no tenderness.  Lymphadenopathy: No lymphadenopathy noted, cervical, inguinal or axillary Neuro: Alert. Normal reflexes, muscle tone coordination. No cranial nerve deficit. Skin: Skin is warm and dry. No rash noted. Not diaphoretic. No erythema. No pallor. Psychiatric: Normal mood and affect. Behavior, judgment, thought content normal.   Assessment & Plan:  1. Essential hypertension, stable , improved with recheck. We have discussed target BP range and blood pressure goal. I have advised patient to check BP regularly and to call us back or report to clinic if the numbers are consistently higher than 140/90. We discussed the importance of compliance with medical therapy and DASH diet recommended, consequences of uncontrolled hypertension discussed.  -Continue to encouraged smoking cessation as this will improve BP - continue current BP medications, no changes toda  2. Shortness of  breath, chronic , suspect this is likely due deconditioning and chronic tobacco use history. -Encouraged smoking cessation. -Patient is scheduled for follow-up with cardiology tomorrow and per prior note, patient is due to ECHO.   3. Screen lipid, pending lipid panel as patient is not fasting today. Will obtain lipid panel at next follow-up.   Meds ordered this encounter  Medications  . lisinopril (PRINIVIL,ZESTRIL) 40 MG tablet    Sig: Take 1 tablet (40 mg total) by mouth daily.    Dispense:  90 tablet    Refill:  1    Patient to pick up as needed    Order Specific Question:   Supervising Provider    Answer:   Tresa Garter [id number]  . metoprolol tartrate (LOPRESSOR) 25 MG tablet    Sig: Take 1 tablet (25 mg total) by mouth 2 (two) times daily.    Dispense:  90 tablet    Refill:  1    Patient to pick up as needed  Order Specific Question:   Supervising Provider    Answer:   Tresa Garter [[id number]]    Orders Placed This Encounter  Procedures  . CBC with Differential  . Comprehensive metabolic panel  . TSH  . Lipid panel    RTC:  3 months for chronic condition   Carroll Sage. [name], MSN, FNP-C [facility]  [address] [place], [place] [address] [phone]

## 2017-05-08 ENCOUNTER — Ambulatory Visit (INDEPENDENT_AMBULATORY_CARE_PROVIDER_SITE_OTHER): Payer: Self-pay | Admitting: Physician Assistant

## 2017-05-08 ENCOUNTER — Encounter: Payer: Self-pay | Admitting: Physician Assistant

## 2017-05-08 VITALS — BP 118/72 | HR 54 | Resp 16 | Ht 71.0 in | Wt 189.0 lb

## 2017-05-08 DIAGNOSIS — I2510 Atherosclerotic heart disease of native coronary artery without angina pectoris: Secondary | ICD-10-CM

## 2017-05-08 DIAGNOSIS — Z4509 Encounter for adjustment and management of other cardiac device: Secondary | ICD-10-CM

## 2017-05-08 DIAGNOSIS — I428 Other cardiomyopathies: Secondary | ICD-10-CM

## 2017-05-08 DIAGNOSIS — F17200 Nicotine dependence, unspecified, uncomplicated: Secondary | ICD-10-CM

## 2017-05-08 DIAGNOSIS — I110 Hypertensive heart disease with heart failure: Secondary | ICD-10-CM

## 2017-05-08 LAB — COMPREHENSIVE METABOLIC PANEL
ALT: 22 IU/L (ref 0–44)
AST: 30 IU/L (ref 0–40)
Albumin/Globulin Ratio: 1.2 (ref 1.2–2.2)
Albumin: 4 g/dL (ref 3.6–4.8)
Alkaline Phosphatase: 99 IU/L (ref 39–117)
BUN/Creatinine Ratio: 10 (ref 10–24)
BUN: 12 mg/dL (ref 8–27)
Bilirubin Total: 0.2 mg/dL (ref 0.0–1.2)
CALCIUM: 9.5 mg/dL (ref 8.6–10.2)
CO2: 26 mmol/L (ref 20–29)
CREATININE: 1.21 mg/dL (ref 0.76–1.27)
Chloride: 103 mmol/L (ref 96–106)
GFR calc Af Amer: 73 mL/min/{1.73_m2} (ref >59)
GFR, EST NON AFRICAN AMERICAN: 63 mL/min/{1.73_m2} (ref >59)
GLOBULIN, TOTAL: 3.4 g/dL (ref 1.5–4.5)
Glucose: 83 mg/dL (ref 65–99)
Potassium: 3.9 mmol/L (ref 3.5–5.2)
SODIUM: 144 mmol/L (ref 134–144)
Total Protein: 7.4 g/dL (ref 6.0–8.5)

## 2017-05-08 LAB — CBC WITH DIFFERENTIAL/PLATELET
BASOS ABS: 0 10*3/uL (ref 0.0–0.2)
Basos: 0 %
EOS (ABSOLUTE): 0.2 10*3/uL (ref 0.0–0.4)
Eos: 3 %
HEMATOCRIT: 38.2 % (ref 37.5–51.0)
Hemoglobin: 12.7 g/dL — ABNORMAL LOW (ref 13.0–17.7)
Immature Grans (Abs): 0 10*3/uL (ref 0.0–0.1)
Immature Granulocytes: 0 %
LYMPHS ABS: 2.8 10*3/uL (ref 0.7–3.1)
Lymphs: 49 %
MCH: 30.1 pg (ref 26.6–33.0)
MCHC: 33.2 g/dL (ref 31.5–35.7)
MCV: 91 fL (ref 79–97)
MONOS ABS: 0.5 10*3/uL (ref 0.1–0.9)
Monocytes: 9 %
Neutrophils Absolute: 2.3 10*3/uL (ref 1.4–7.0)
Neutrophils: 39 %
Platelets: 276 10*3/uL (ref 150–379)
RBC: 4.22 x10E6/uL (ref 4.14–5.80)
RDW: 15.1 % (ref 12.3–15.4)
WBC: 5.8 10*3/uL (ref 3.4–10.8)

## 2017-05-08 LAB — LIPID PANEL
CHOLESTEROL TOTAL: 137 mg/dL (ref 100–199)
Chol/HDL Ratio: 2.9 ratio (ref 0.0–5.0)
HDL: 48 mg/dL (ref >39)
LDL CALC: 68 mg/dL (ref 0–99)
Triglycerides: 105 mg/dL (ref 0–149)
VLDL Cholesterol Cal: 21 mg/dL (ref 5–40)

## 2017-05-08 LAB — TSH: TSH: 1.02 u[IU]/mL (ref 0.450–4.500)

## 2017-05-08 NOTE — Patient Instructions (Addendum)
Medication Instructions:   Your physician recommends that you continue on your current medications as directed. Please refer to the Current Medication list given to you today.  If you need a refill on your cardiac medications before your next appointment, please call your pharmacy.  Labwork: NONE ORDERED  TODAY    Testing/Procedures: Your physician has requested that you have an echocardiogram. Echocardiography is a painless test that uses sound waves to create images of your heart. It provides your doctor with information about the size and shape of your heart and how well your heart's chambers and valves are working. This procedure takes approximately one hour. There are no restrictions for this procedure.    Follow-Up: Your physician wants you to follow-up in:  IN  [place] will receive a reminder letter in the mail two months in advance. If you don't receive a letter, please call our office to schedule the follow-up appointment.   Your physician wants you to follow-up in:  [place] will receive a reminder letter in the mail two months in advance. If you don't receive a letter, please call our office to schedule the follow-up appointment.      Any Other Special Instructions Will Be Listed Below (If Applicable).

## 2017-05-13 MED FILL — ?PANTOPRAZOLE SOD DR 40MG: 40 MG | 30 days supply | Qty: 60 | Fill #2

## 2017-05-13 MED FILL — AMLODIPINE BESYLATE 2.5 MG: 2.5 | 30 days supply | Qty: 30 | Fill #2

## 2017-05-15 ENCOUNTER — Other Ambulatory Visit: Payer: Self-pay

## 2017-05-15 ENCOUNTER — Ambulatory Visit (HOSPITAL_COMMUNITY): Payer: Self-pay | Attending: Physician Assistant

## 2017-05-15 DIAGNOSIS — Z8673 Personal history of transient ischemic attack (TIA), and cerebral infarction without residual deficits: Secondary | ICD-10-CM | POA: Insufficient documentation

## 2017-05-15 DIAGNOSIS — I428 Other cardiomyopathies: Secondary | ICD-10-CM | POA: Insufficient documentation

## 2017-05-15 DIAGNOSIS — Z720 Tobacco use: Secondary | ICD-10-CM | POA: Insufficient documentation

## 2017-05-15 DIAGNOSIS — I119 Hypertensive heart disease without heart failure: Secondary | ICD-10-CM | POA: Insufficient documentation

## 2017-05-21 ENCOUNTER — Other Ambulatory Visit: Payer: Self-pay | Admitting: Family Medicine

## 2017-05-21 ENCOUNTER — Telehealth: Payer: Self-pay | Admitting: *Deleted

## 2017-05-21 MED FILL — ?METOPROLOL 25 MG TABLET: 25 | 30 days supply | Qty: 60 | Fill #0

## 2017-05-21 NOTE — Telephone Encounter (Signed)
-----   Message from [facility], [place] sent at 05/19/2017 10:49 AM EST ----- Please let the patient know his echo appears similar to last, please have him scheduled to see Dr.Taylor in the next couple weeks please for follow up on this and management.  Thanks [employer]

## 2017-05-21 NOTE — Telephone Encounter (Signed)
LMOVM  WITH PT AND SISTER TO CALL BACK CLINIC FOR RESULTS

## 2017-05-22 ENCOUNTER — Telehealth: Payer: Self-pay | Admitting: Physician Assistant

## 2017-05-22 MED FILL — LISINOPRIL 40 MG TAB: 40 | 30 days supply | Qty: 30 | Fill #5

## 2017-05-22 NOTE — Telephone Encounter (Signed)
New Message    [name] is calling back about patient test results. Please call.

## 2017-05-22 NOTE — Telephone Encounter (Signed)
Echo results reviewed with patient's sister, [name]. She verbalized understanding and agreement to bring patient to see Dr. Lovena Le on 1/24. I advised her to monitor patient for weight gain and/or SOB and to reduce sodium in patient's diet. She states patient is taking medications as directed. I advised her to call back with questions or concerns.

## 2017-05-22 NOTE — Telephone Encounter (Signed)
Gregory Perry is returning a call about Gregory Perry Test Results . Thanks

## 2017-05-28 ENCOUNTER — Ambulatory Visit (INDEPENDENT_AMBULATORY_CARE_PROVIDER_SITE_OTHER): Payer: Self-pay | Admitting: *Deleted

## 2017-05-28 DIAGNOSIS — I639 Cerebral infarction, unspecified: Secondary | ICD-10-CM

## 2017-05-29 NOTE — Progress Notes (Signed)
Carelink Summary Report / Loop Recorder 

## 2017-06-03 ENCOUNTER — Ambulatory Visit: Payer: Self-pay | Attending: Internal Medicine

## 2017-06-03 MED FILL — ?ATORVASTATIN 40MG TABLET: 40 | 30 days supply | Qty: 30 | Fill #7

## 2017-06-11 LAB — CUP PACEART REMOTE DEVICE CHECK
Implantable Pulse Generator Implant Date: 20170623
MDC IDC SESS DTM: 20190103031439

## 2017-06-16 MED FILL — ?PANTOPRAZOLE SOD DR 40MG: 40 MG | 30 days supply | Qty: 60 | Fill #3

## 2017-06-19 ENCOUNTER — Ambulatory Visit (INDEPENDENT_AMBULATORY_CARE_PROVIDER_SITE_OTHER): Payer: Self-pay | Admitting: Internal Medicine

## 2017-06-19 ENCOUNTER — Encounter: Payer: Self-pay | Admitting: Internal Medicine

## 2017-06-19 VITALS — BP 122/72 | HR 69 | Resp 16 | Ht 71.0 in | Wt 182.0 lb

## 2017-06-19 DIAGNOSIS — I428 Other cardiomyopathies: Secondary | ICD-10-CM

## 2017-06-19 DIAGNOSIS — I639 Cerebral infarction, unspecified: Secondary | ICD-10-CM

## 2017-06-19 LAB — CUP PACEART INCLINIC DEVICE CHECK
Implantable Pulse Generator Implant Date: 20170623
MDC IDC SESS DTM: 20190124094941

## 2017-06-19 NOTE — Patient Instructions (Signed)

## 2017-06-19 NOTE — Progress Notes (Signed)
HPI Mr. Verbrugge returns today for ongoing evaluation and management of his cryptogenic stroke, and mild/moderate LV dysfunction. He has no specific complaints today. He denies peripheral edema, chest pain or sob. No syncope. No Known Allergies   Current Outpatient Medications  Medication Sig Dispense Refill  . amLODipine (NORVASC) 2.5 MG tablet TAKE 1 TABLET BY MOUTH DAILY. 30 tablet 3  . aspirin EC 81 MG tablet Take 81 mg by mouth daily.    Marland Kitchen atorvastatin (LIPITOR) 40 MG tablet Take 1 tablet (40 mg total) by mouth daily at 6 PM. 90 tablet 2  . Blood Pressure Monitoring (BLOOD PRESSURE CUFF) MISC Check blood pressure daily between 8 pm -9 pm 1 each 0  . lisinopril (PRINIVIL,ZESTRIL) 40 MG tablet Take 1 tablet (40 mg total) by mouth daily. 90 tablet 1  . metoprolol tartrate (LOPRESSOR) 25 MG tablet Take 1 tablet (25 mg total) by mouth 2 (two) times daily. 90 tablet 1  . pantoprazole (PROTONIX) 40 MG tablet TAKE 1 TABLET BY MOUTH 2 TIMES DAILY BEFORE A MEAL. 90 tablet 2   No current facility-administered medications for this visit.      Past Medical History:  Diagnosis Date  . Alcohol abuse   . Candida esophagitis ([place])   . Cardiomyopathy ([place])   . CVA (cerebral infarction)   . Duodenal ulcer   . Dysphagia   . Headache(784.0)    history of  . Hypertension   . Severe protein-calorie malnutrition ([place])   . Stroke ([place])     ROS:   All systems reviewed and negative except as noted in the HPI.   Past Surgical History:  Procedure Laterality Date  . BALLOON DILATION N/A 11/22/2015   Procedure: BALLOON DILATION;  Surgeon: Mauri Pole, MD;  Location: [facility];  Service: Endoscopy;  Laterality: N/A;  . CARDIAC CATHETERIZATION N/A 01/17/2016   Procedure: Left Heart Cath and Coronary Angiography;  Surgeon: Sherren Mocha, MD;  Location: [facility];  Service: Cardiovascular;  Laterality: N/A;  . DIRECT LARYNGOSCOPY  05/07/2011   Procedure: DIRECT LARYNGOSCOPY;   Surgeon: Rozetta Nunnery, MD;  Location: [facility];  Service: ENT;  Laterality: N/A;  ESOPHAGOSCOPY  WITH DILATION  . EP IMPLANTABLE DEVICE N/A 11/17/2015   Procedure: Loop Recorder Insertion;  Surgeon: Evans Lance, MD;  Location: [facility];  Service: Cardiovascular;  Laterality: N/A;  . ESOPHAGOGASTRODUODENOSCOPY  04/29/2011   Procedure: ESOPHAGOGASTRODUODENOSCOPY (EGD);  Surgeon: Winfield Cunas., MD;  Location: [facility];  Service: Endoscopy;  Laterality: N/A;  . ESOPHAGOGASTRODUODENOSCOPY N/A 11/22/2015   Procedure: ESOPHAGOGASTRODUODENOSCOPY (EGD);  Surgeon: Mauri Pole, MD;  Location[facility];  Service: Endoscopy;  Laterality: N/A;  . LOOP RECORDER IMPLANT       Family History  Problem Relation Age of Onset  . Heart attack Maternal Grandfather      Social History   Socioeconomic History  . Marital status: Widowed    Spouse name: Not on file  . Number of children: Not on file  . Years of education: Not on file  . Highest education level: Not on file  Social Needs  . Financial resource strain: Not on file  . Food insecurity - worry: Not on file  . Food insecurity - inability: Not on file  . Transportation needs - medical: Not on file  . Transportation needs - non-medical: Not on file  Occupational History  . Not on file  Tobacco Use  . Smoking status:  Current Every Day Smoker    Packs/day: 0.50    Years: 30.00    Pack years: 15.00    Types: Cigarettes  . Smokeless tobacco: Never Used  . Tobacco comment: Pt smokes 6 to 7 a day trying to quit  Substance and Sexual Activity  . Alcohol use: No    Alcohol/week: 1.8 oz    Types: 3 Shots of liquor per week    Comment: quit in 10/2015   . Drug use: Yes    Types: Marijuana    Comment: occ  . Sexual activity: Not on file  Other Topics Concern  . Not on file  Social History Narrative  . Not on file     BP 122/72   Pulse 69   Resp 16   Ht 5\' 11"  (1.803 m)   Wt 182 lb (82.6 kg)   SpO2  98%   BMI 25.38 kg/m   Physical Exam:  Well appearing 67 yo man, diskempt, NAD HEENT: Unremarkable Neck:  6 cm JVD, no thyromegally Lymphatics:  No adenopathy Back:  No CVA tenderness Lungs:  Clear with no wheezes HEART:  Regular rate rhythm, no murmurs, no rubs, no clicks Abd:  soft, positive bowel sounds, no organomegally, no rebound, no guarding Ext:  2 plus pulses, no edema, no cyanosis, no clubbing Skin:  No rashes no nodules Neuro:  CN II through XII intact, motor grossly intact  EKG -NSR with first degree AV block   Assess/Plan: 1. Cryptogenic stroke - no clear cut atrial fib. He will undergo watchful waiting. 2. DCM - he has class 1 symptoms. He will continue his current meds. No indication for an ICD at this time. 3. CAD - he denies anginal symptoms.  4. ETOH abuse - he has been in remission for a  Year.  5. Tobacco abuse - he is trying to cut back.  Mikle Bosworth.D.

## 2017-06-23 MED FILL — ?METOPROLOL 25 MG TABLET: 25 | 30 days supply | Qty: 60 | Fill #1

## 2017-06-23 MED FILL — AMLODIPINE BESYLATE 2.5 MG: 2.5 | 30 days supply | Qty: 30 | Fill #3

## 2017-06-24 MED FILL — LISINOPRIL 40 MG TAB: 40 | 30 days supply | Qty: 30 | Fill #0

## 2017-06-27 ENCOUNTER — Ambulatory Visit (INDEPENDENT_AMBULATORY_CARE_PROVIDER_SITE_OTHER): Payer: Self-pay | Admitting: *Deleted

## 2017-06-27 DIAGNOSIS — I639 Cerebral infarction, unspecified: Secondary | ICD-10-CM

## 2017-06-30 NOTE — Progress Notes (Signed)
Carelink Summary Report / Loop Recorder 

## 2017-07-01 LAB — CUP PACEART REMOTE DEVICE CHECK
Date Time Interrogation Session: 20190202034327
Implantable Pulse Generator Implant Date: 20170623

## 2017-07-04 ENCOUNTER — Other Ambulatory Visit: Payer: Self-pay | Admitting: Family Medicine

## 2017-07-04 DIAGNOSIS — E785 Hyperlipidemia, unspecified: Secondary | ICD-10-CM

## 2017-07-04 MED FILL — ?ATORVASTATIN 40MG TABLET: 40 | 30 days supply | Qty: 30 | Fill #8

## 2017-07-22 ENCOUNTER — Other Ambulatory Visit: Payer: Self-pay

## 2017-07-22 ENCOUNTER — Other Ambulatory Visit: Payer: Self-pay | Admitting: Family Medicine

## 2017-07-22 DIAGNOSIS — I10 Essential (primary) hypertension: Secondary | ICD-10-CM

## 2017-07-22 MED FILL — AMLODIPINE BESYLATE 2.5 MG: 2.5 | 30 days supply | Qty: 30 | Fill #0

## 2017-07-22 MED FILL — ?PANTOPRAZOLE SOD DR 40MG T: 40 | 15 days supply | Qty: 30 | Fill #4

## 2017-07-25 MED FILL — ?METOPROLOL 25 MG TABLET: 25 | 30 days supply | Qty: 60 | Fill #2

## 2017-07-25 MED FILL — LISINOPRIL 40 MG TAB: 40 | 30 days supply | Qty: 30 | Fill #1

## 2017-07-30 ENCOUNTER — Ambulatory Visit (INDEPENDENT_AMBULATORY_CARE_PROVIDER_SITE_OTHER): Payer: Self-pay | Admitting: *Deleted

## 2017-07-30 DIAGNOSIS — I639 Cerebral infarction, unspecified: Secondary | ICD-10-CM

## 2017-07-31 NOTE — Progress Notes (Signed)
Carelink Summary Report / Loop Recorder 

## 2017-08-05 ENCOUNTER — Encounter: Payer: Self-pay | Admitting: Family Medicine

## 2017-08-05 ENCOUNTER — Ambulatory Visit (INDEPENDENT_AMBULATORY_CARE_PROVIDER_SITE_OTHER): Payer: Self-pay | Admitting: Family Medicine

## 2017-08-05 VITALS — BP 144/82 | HR 70 | Temp 98.0°F | Resp 14 | Ht 71.0 in | Wt 174.0 lb

## 2017-08-05 DIAGNOSIS — E785 Hyperlipidemia, unspecified: Secondary | ICD-10-CM

## 2017-08-05 DIAGNOSIS — I10 Essential (primary) hypertension: Secondary | ICD-10-CM

## 2017-08-05 DIAGNOSIS — Z125 Encounter for screening for malignant neoplasm of prostate: Secondary | ICD-10-CM

## 2017-08-05 LAB — POCT URINALYSIS DIP (DEVICE)
Bilirubin Urine: NEGATIVE
GLUCOSE, UA: NEGATIVE mg/dL
Hgb urine dipstick: NEGATIVE
Ketones, ur: NEGATIVE mg/dL
Leukocytes, UA: NEGATIVE
Nitrite: NEGATIVE
PROTEIN: 30 mg/dL — AB
SPECIFIC GRAVITY, URINE: 1.025 (ref 1.005–1.030)
UROBILINOGEN UA: 1 mg/dL (ref 0.0–1.0)
pH: 6 (ref 5.0–8.0)

## 2017-08-05 MED ORDER — LISINOPRIL 40 MG PO TABS: 40.0000 mg | ORAL_TABLET | Freq: Every day | ORAL | 1 refills | Status: DC

## 2017-08-05 MED ORDER — AMLODIPINE BESYLATE 2.5 MG PO TABS: 2.5000 mg | ORAL_TABLET | Freq: Every day | ORAL | 2 refills | Status: DC

## 2017-08-05 MED ORDER — ATORVASTATIN CALCIUM 40 MG PO TABS: ORAL_TABLET | ORAL | 2 refills | Status: DC

## 2017-08-05 MED ORDER — METOPROLOL TARTRATE 25 MG PO TABS: 25.0000 mg | ORAL_TABLET | Freq: Two times a day (BID) | ORAL | 1 refills | Status: DC

## 2017-08-05 MED FILL — ?ATORVASTATIN 40MG TABLET: 40 | 30 days supply | Qty: 30 | Fill #0

## 2017-08-05 NOTE — Patient Instructions (Addendum)
You will be notified of any abnormal labs.   Continue medication as prescribed.    Steps to Quit Smoking Smoking tobacco can be bad for your health. It can also affect almost every organ in your body. Smoking puts you and people around you at risk for many serious long-lasting (chronic) diseases. Quitting smoking is hard, but it is one of the best things that you can do for your health. It is never too late to quit. What are the benefits of quitting smoking? When you quit smoking, you lower your risk for getting serious diseases and conditions. They can include:  Lung cancer or lung disease.  Heart disease.  Stroke.  Heart attack.  Not being able to have children (infertility).  Weak bones (osteoporosis) and broken bones (fractures).  If you have coughing, wheezing, and shortness of breath, those symptoms may get better when you quit. You may also get sick less often. If you are pregnant, quitting smoking can help to lower your chances of having a baby of low birth weight. What can I do to help me quit smoking? Talk with your doctor about what can help you quit smoking. Some things you can do (strategies) include:  Quitting smoking totally, instead of slowly cutting back how much you smoke over a period of time.  Going to in-person counseling. You are more likely to quit if you go to many counseling sessions.  Using resources and support systems, such as: ? Database administrator with a Social worker. ? Phone quitlines. ? Careers information officer. ? Support groups or group counseling. ? Text messaging programs. ? Mobile phone apps or applications.  Taking medicines. Some of these medicines may have nicotine in them. If you are pregnant or breastfeeding, do not take any medicines to quit smoking unless your doctor says it is okay. Talk with your doctor about counseling or other things that can help you.  Talk with your doctor about using more than one strategy at the same time, such as  taking medicines while you are also going to in-person counseling. This can help make quitting easier. What things can I do to make it easier to quit? Quitting smoking might feel very hard at first, but there is a lot that you can do to make it easier. Take these steps:  Talk to your family and friends. Ask them to support and encourage you.  Call phone quitlines, reach out to support groups, or work with a Social worker.  Ask people who smoke to not smoke around you.  Avoid places that make you want (trigger) to smoke, such as: ? Bars. ? Parties. ? Smoke-break areas at work.  Spend time with people who do not smoke.  Lower the stress in your life. Stress can make you want to smoke. Try these things to help your stress: ? Getting regular exercise. ? Deep-breathing exercises. ? Yoga. ? Meditating. ? Doing a body scan. To do this, close your eyes, focus on one area of your body at a time from head to toe, and notice which parts of your body are tense. Try to relax the muscles in those areas.  Download or buy apps on your mobile phone or tablet that can help you stick to your quit plan. There are many free apps, such as QuitGuide from the [employer] Office manager for Disease Control and Prevention). You can find more support from smokefree.gov and other websites.  This information is not intended to replace advice given to you by your health care provider.  Make sure you discuss any questions you have with your health care provider. Document Released: 03/09/2009 Document Revised: 01/09/2016 Document Reviewed: 09/27/2014 Elsevier Interactive Patient Education  2018 [employer].       Hypertension Hypertension is another name for high blood pressure. High blood pressure forces your heart to work harder to pump blood. This can cause problems over time. There are two numbers in a blood pressure reading. There is a top number (systolic) over a bottom number (diastolic). It is best to have a blood  pressure below 120/80. Healthy choices can help lower your blood pressure. You may need medicine to help lower your blood pressure if:  Your blood pressure cannot be lowered with healthy choices.  Your blood pressure is higher than 130/80.  Follow these instructions at home: Eating and drinking  If directed, follow the DASH eating plan. This diet includes: ? Filling half of your plate at each meal with fruits and vegetables. ? Filling one quarter of your plate at each meal with whole grains. Whole grains include whole wheat pasta, brown rice, and whole grain bread. ? Eating or drinking low-fat dairy products, such as skim milk or low-fat yogurt. ? Filling one quarter of your plate at each meal with low-fat (lean) proteins. Low-fat proteins include fish, skinless chicken, eggs, beans, and tofu. ? Avoiding fatty meat, cured and processed meat, or chicken with skin. ? Avoiding premade or processed food.  Eat less than 1,500 mg of salt (sodium) a day.  Limit alcohol use to no more than 1 drink a day for nonpregnant women and 2 drinks a day for men. One drink equals 12 oz of beer, 5 oz of wine, or 1 oz of hard liquor. Lifestyle  Work with your doctor to stay at a healthy weight or to lose weight. Ask your doctor what the best weight is for you.  Get at least 30 minutes of exercise that causes your heart to beat faster (aerobic exercise) most days of the week. This may include walking, swimming, or biking.  Get at least 30 minutes of exercise that strengthens your muscles (resistance exercise) at least 3 days a week. This may include lifting weights or pilates.  Do not use any products that contain nicotine or tobacco. This includes cigarettes and e-cigarettes. If you need help quitting, ask your doctor.  Check your blood pressure at home as told by your doctor.  Keep all follow-up visits as told by your doctor. This is important. Medicines  Take over-the-counter and prescription  medicines only as told by your doctor. Follow directions carefully.  Do not skip doses of blood pressure medicine. The medicine does not work as well if you skip doses. Skipping doses also puts you at risk for problems.  Ask your doctor about side effects or reactions to medicines that you should watch for. Contact a doctor if:  You think you are having a reaction to the medicine you are taking.  You have headaches that keep coming back (recurring).  You feel dizzy.  You have swelling in your ankles.  You have trouble with your vision. Get help right away if:  You get a very bad headache.  You start to feel confused.  You feel weak or numb.  You feel faint.  You get very bad pain in your: ? Chest. ? Belly (abdomen).  You throw up (vomit) more than once.  You have trouble breathing. Summary  Hypertension is another name for high blood pressure.  Making healthy  choices can help lower blood pressure. If your blood pressure cannot be controlled with healthy choices, you may need to take medicine. This information is not intended to replace advice given to you by your health care provider. Make sure you discuss any questions you have with your health care provider. Document Released: 10/30/2007 Document Revised: 04/10/2016 Document Reviewed: 04/10/2016 Elsevier Interactive Patient Education  [employer].

## 2017-08-05 NOTE — Progress Notes (Signed)
Patient ID: Gregory Perry, male    DOB: 1951/02/22, 67 y.o.   MRN: [id number]  PCP: [name], FNP  Chief Complaint  Patient presents with  . Follow-up    3 month on chronic condition    Subjective:  HPI Gregory Perry is a 67 y.o. male with history of stroke, hypertension, nonischemic cardiomyopathy, hyperlipidemia, and everyday smoker.  Gregory Perry was recently seen by cardiology for evaluation of pacemaker. Patient condition is stable and therefore ICD is not warranted. Echo recommended within 6 months as he has been very compliant with blood pressure. Last EF  30-35% 05/15/2017. He continues to smoke although continues to reduce the number of cigarettes smoked daily. He makes efforts to follow-up a low sodium diet. Current Body mass index is 24.27 kg/m.  He denies chest pain, SOB, headaches, or weakness. Social History   Socioeconomic History  . Marital status: Widowed    Spouse name: Not on file  . Number of children: Not on file  . Years of education: Not on file  . Highest education level: Not on file  Social Needs  . Financial resource strain: Not on file  . Food insecurity - worry: Not on file  . Food insecurity - inability: Not on file  . Transportation needs - medical: Not on file  . Transportation needs - non-medical: Not on file  Occupational History  . Not on file  Tobacco Use  . Smoking status: Current Every Day Smoker    Packs/day: 0.50    Years: 30.00    Pack years: 15.00    Types: Cigarettes  . Smokeless tobacco: Never Used  . Tobacco comment: Pt smokes 6 to 7 a day trying to quit  Substance and Sexual Activity  . Alcohol use: No    Alcohol/week: 1.8 oz    Types: 3 Shots of liquor per week    Comment: quit in 10/2015   . Drug use: Yes    Types: Marijuana    Comment: occ  . Sexual activity: Not on file  Other Topics Concern  . Not on file  Social History Narrative  . Not on file    Family History  Problem Relation Age of Onset  .  Heart attack Maternal Grandfather    Review of Systems Pertinent negatives list in HPI  Patient Active Problem List   Diagnosis Date Noted  . Abnormal nuclear stress test 01/17/2016  . Candida esophagitis ([place])   . Pre-operative cardiovascular examination, LVEF < 35% 11/20/2015  . Cryptogenic stroke ([place]) 11/17/2015  . Essential hypertension 11/17/2015  . GERD (gastroesophageal reflux disease) 11/17/2015  . Loss of weight 11/17/2015  . Tobacco abuse 11/17/2015  . Dysphagia 11/17/2015  . Protein-calorie malnutrition, severe 11/17/2015  . Cerebral infarction due to thrombosis of right posterior cerebral artery ([place])   . History of stroke   . Cardiomyopathy, ischemic   . Esophageal stricture   . Dysphasia 05/07/2011    No Known Allergies  Prior to Admission medications   Medication Sig Start Date End Date Taking? Authorizing Provider  amLODipine (NORVASC) 2.5 MG tablet TAKE 1 TABLET BY MOUTH DAILY. 07/22/17  Yes [name], FNP  aspirin EC 81 MG tablet Take 81 mg by mouth daily.   Yes [provider]  atorvastatin (LIPITOR) 40 MG tablet TAKE 1 TABLET BY MOUTH DAILY AT 6 PM. 07/04/17  Yes [name], FNP  Blood Pressure Monitoring (BLOOD PRESSURE CUFF) MISC Check blood pressure daily between 8 pm -  9 pm 09/13/16  Yes [name], FNP  lisinopril (PRINIVIL,ZESTRIL) 40 MG tablet Take 1 tablet (40 mg total) by mouth daily. 05/07/17  Yes [name], FNP  metoprolol tartrate (LOPRESSOR) 25 MG tablet Take 1 tablet (25 mg total) by mouth 2 (two) times daily. 05/07/17  Yes [name], FNP  pantoprazole (PROTONIX) 40 MG tablet TAKE 1 TABLET BY MOUTH 2 TIMES DAILY BEFORE A MEAL. 03/10/17  Yes [name], FNP    Past Medical, Surgical Family and Social History reviewed and updated.    Objective:   Today's Vitals   08/05/17 0807 08/05/17 0831  BP: (!) 150/80 (!) 144/82  Pulse: 70   Resp: 14   Temp: 98 F (36.7 C)   TempSrc: Oral    SpO2: 100%   Weight: 174 lb (78.9 kg)   Height: 5\' 11"  (1.803 m)     Wt Readings from Last 3 Encounters:  08/05/17 174 lb (78.9 kg)  06/19/17 182 lb (82.6 kg)  05/08/17 189 lb (85.7 kg)    Physical Exam Constitutional: Patient appears well-developed and well-nourished. No distress. HENT: Normocephalic, atraumatic. Eyes: Conjunctivae and EOM are normal. PERRLA, no scleral icterus. Neck: Normal ROM. Neck supple. No JVD. No tracheal deviation. No thyromegaly. CVS: RRR, S1/S2 +, no murmurs, no gallops, no carotid bruit.  Pulmonary: Effort and breath sounds normal, no stridor, rhonchi, wheezes, rales.  Abdominal: Soft. BS +, no distension, tenderness, rebound or guarding.  Musculoskeletal: Normal range of motion. No edema and no tenderness.  Neuro: Alert. Normal reflexes, muscle tone coordination. No cranial nerve deficit. Skin: Skin is warm and dry. No rash noted. Not diaphoretic. No erythema. No pallor. Psychiatric: Normal mood and affect. Behavior, judgment, thought content normal.   Assessment & Plan:  1. Essential hypertension, stable today. Continue current medication and efforts to quit smoking. We have discussed target BP range and blood pressure goal. I have advised patient to check BP regularly and to call us back or report to clinic if the numbers are consistently higher than 140/90. We discussed the importance of compliance with medical therapy and DASH diet recommended, consequences of uncontrolled hypertension discussed.  Continue current BP medications- Basic metabolic panel  2. Screening PSA (prostate specific antigen), checking  PSA. No prior comparison on file. Asymptomatic   3. Hyperlipidemia, unspecified hyperlipidemia type atorvastatin (LIPITOR) 40 MG tablet; TAKE 1 TABLET BY MOUTH DAILY AT 6 PM.   Meds ordered this encounter  Medications  . metoprolol tartrate (LOPRESSOR) 25 MG tablet    Sig: Take 1 tablet (25 mg total) by mouth 2 (two) times daily.     Dispense:  90 tablet    Refill:  1    Patient to pick up as needed    Order Specific Question:   Supervising Provider    Answer:   Tresa Garter [id number]  . lisinopril (PRINIVIL,ZESTRIL) 40 MG tablet    Sig: Take 1 tablet (40 mg total) by mouth daily.    Dispense:  90 tablet    Refill:  1    Patient to pick up as needed    Order Specific Question:   Supervising Provider    Answer:   Tresa Garter [id number]  . atorvastatin (LIPITOR) 40 MG tablet    Sig: TAKE 1 TABLET BY MOUTH DAILY AT 6 PM.    Dispense:  90 tablet    Refill:  2    Adding refills    Order Specific Question:   Supervising  Provider    Answer:   Tresa Garter [[id number]]  . amLODipine (NORVASC) 2.5 MG tablet    Sig: Take 1 tablet (2.5 mg total) by mouth daily.    Dispense:  90 tablet    Refill:  2    Added additional refills    Order Specific Question:   Supervising Provider    Answer:   Tresa Garter [[id number]]      RTC: 3 months for chronic condition management    Carroll Sage. [name], MSN, FNP-C [facility]  [address] [place], [place] [address] [phone]

## 2017-08-06 ENCOUNTER — Other Ambulatory Visit: Payer: Self-pay | Admitting: Family Medicine

## 2017-08-06 LAB — BASIC METABOLIC PANEL
BUN / CREAT RATIO: 7 — AB (ref 10–24)
BUN: 8 mg/dL (ref 8–27)
CHLORIDE: 98 mmol/L (ref 96–106)
CO2: 28 mmol/L (ref 20–29)
Calcium: 9.1 mg/dL (ref 8.6–10.2)
Creatinine, Ser: 1.19 mg/dL (ref 0.76–1.27)
GFR calc non Af Amer: 64 mL/min/{1.73_m2} (ref >59)
GFR, EST AFRICAN AMERICAN: 74 mL/min/{1.73_m2} (ref >59)
Glucose: 93 mg/dL (ref 65–99)
Potassium: 3.1 mmol/L — ABNORMAL LOW (ref 3.5–5.2)
SODIUM: 141 mmol/L (ref 134–144)

## 2017-08-06 LAB — PSA: PROSTATE SPECIFIC AG, SERUM: 11.9 ng/mL — AB (ref 0.0–4.0)

## 2017-08-12 ENCOUNTER — Telehealth: Payer: Self-pay | Admitting: Family Medicine

## 2017-08-12 DIAGNOSIS — R972 Elevated prostate specific antigen [PSA]: Secondary | ICD-10-CM

## 2017-08-12 NOTE — Telephone Encounter (Signed)
Contact patient to advise I am referring him to [facility] as his recent prostate screening lab work was abnormal and elevated. He will need to complete a [place] application or pay out of pocket for visit. I highly recommend follow-up as soon as possible.   Morey Hummingbird,  Please process referral to [facility].    Carroll Sage. [name], MSN, FNP-C [facility]  [address] [place],  [address] [phone]

## 2017-08-13 ENCOUNTER — Telehealth: Payer: Self-pay | Admitting: Family Medicine

## 2017-08-13 MED ORDER — POTASSIUM CHLORIDE CRYS ER 20 MEQ PO TBCR: 20.0000 meq | EXTENDED_RELEASE_TABLET | Freq: Every day | ORAL | 0 refills | Status: DC

## 2017-08-13 NOTE — Telephone Encounter (Signed)
Left a vm for patient to callback 

## 2017-08-13 NOTE — Telephone Encounter (Signed)
Notify patient potassium level is low. Sent over a potassium replacement 20 meq x 5 days. If patient doesn't return call regarding PSA or unable to reach by phone, send an unable to contact letter.

## 2017-08-13 NOTE — Telephone Encounter (Signed)
Please process referral to urology. Chart is completed

## 2017-08-14 MED FILL — POTASSIUM CL ER 20 MEQ TAB: 20 | 5 days supply | Qty: 5 | Fill #0

## 2017-08-14 NOTE — Telephone Encounter (Signed)
Referral faxed

## 2017-08-14 NOTE — Telephone Encounter (Signed)
Left a vm for patient to callback 

## 2017-08-14 NOTE — Telephone Encounter (Signed)
Patient notified and will wait to hear from [facility]

## 2017-08-14 NOTE — Telephone Encounter (Signed)
Patient notified

## 2017-08-18 MED FILL — AMLODIPINE BESYLATE 2.5 MG: 2.5 | 30 days supply | Qty: 30 | Fill #1

## 2017-08-18 MED FILL — ?PANTOPRAZOLE SOD DR 40MG T: 40 | 30 days supply | Qty: 60 | Fill #0

## 2017-08-25 MED FILL — LISINOPRIL 40 MG TAB: 40 | 30 days supply | Qty: 30 | Fill #2

## 2017-09-01 ENCOUNTER — Ambulatory Visit (INDEPENDENT_AMBULATORY_CARE_PROVIDER_SITE_OTHER): Payer: Self-pay | Admitting: *Deleted

## 2017-09-01 DIAGNOSIS — I639 Cerebral infarction, unspecified: Secondary | ICD-10-CM

## 2017-09-01 MED FILL — METOPROLOL TARTRATE 25 MG T: 25 | 30 days supply | Qty: 60 | Fill #0

## 2017-09-02 NOTE — Progress Notes (Signed)
Carelink Summary Report / Loop Recorder 

## 2017-09-11 LAB — CUP PACEART REMOTE DEVICE CHECK
Date Time Interrogation Session: 20190307033524
MDC IDC PG IMPLANT DT: 20170623

## 2017-09-15 MED FILL — ?ATORVASTATIN 40MG TABLET: 40 | 30 days supply | Qty: 30 | Fill #1

## 2017-09-24 MED FILL — LISINOPRIL 40 MG TAB: 40 | 30 days supply | Qty: 30 | Fill #3

## 2017-09-24 MED FILL — ?PANTOPRAZOLE SO DR 40MG TA: 40 | 30 days supply | Qty: 60 | Fill #1

## 2017-09-24 MED FILL — ?AMLODIPINE BESYLAT 2.5MG: 2.5 | 30 days supply | Qty: 30 | Fill #2

## 2017-10-06 ENCOUNTER — Ambulatory Visit (INDEPENDENT_AMBULATORY_CARE_PROVIDER_SITE_OTHER): Payer: Medicare Other | Admitting: *Deleted

## 2017-10-06 DIAGNOSIS — I639 Cerebral infarction, unspecified: Secondary | ICD-10-CM

## 2017-10-06 LAB — CUP PACEART REMOTE DEVICE CHECK
Implantable Pulse Generator Implant Date: 20170623
MDC IDC SESS DTM: 20190409074130

## 2017-10-06 MED FILL — METOPROLOL TARTRATE 25 MG T: 25 | 30 days supply | Qty: 60 | Fill #1

## 2017-10-07 NOTE — Progress Notes (Signed)
Carelink Summary Report / Loop Recorder 

## 2017-10-22 MED FILL — ?AMLODIPINE BESYLAT 2.5MG: 2.5 | 30 days supply | Qty: 30 | Fill #3

## 2017-10-22 MED FILL — ?ATORVASTATIN 40MG TABLET: 40 | 30 days supply | Qty: 30 | Fill #2

## 2017-10-29 MED FILL — LISINOPRIL 40 MG TABLET: 40 | 30 days supply | Qty: 30 | Fill #4

## 2017-10-30 MED FILL — ?PANTOPRAZOLE SO DR 40MG TA: 40 | 30 days supply | Qty: 60 | Fill #2

## 2017-10-31 LAB — CUP PACEART REMOTE DEVICE CHECK
Implantable Pulse Generator Implant Date: 20170623
MDC IDC SESS DTM: 20190512080514

## 2017-11-05 ENCOUNTER — Encounter: Payer: Self-pay | Admitting: Family Medicine

## 2017-11-05 ENCOUNTER — Ambulatory Visit (INDEPENDENT_AMBULATORY_CARE_PROVIDER_SITE_OTHER): Payer: Medicare Other | Admitting: Family Medicine

## 2017-11-05 VITALS — BP 124/86 | HR 80 | Temp 98.0°F | Ht 71.0 in | Wt 168.0 lb

## 2017-11-05 DIAGNOSIS — E785 Hyperlipidemia, unspecified: Secondary | ICD-10-CM | POA: Diagnosis not present

## 2017-11-05 DIAGNOSIS — I10 Essential (primary) hypertension: Secondary | ICD-10-CM | POA: Diagnosis not present

## 2017-11-05 DIAGNOSIS — Z131 Encounter for screening for diabetes mellitus: Secondary | ICD-10-CM | POA: Diagnosis not present

## 2017-11-05 DIAGNOSIS — H539 Unspecified visual disturbance: Secondary | ICD-10-CM

## 2017-11-05 DIAGNOSIS — I639 Cerebral infarction, unspecified: Secondary | ICD-10-CM

## 2017-11-05 DIAGNOSIS — Z09 Encounter for follow-up examination after completed treatment for conditions other than malignant neoplasm: Secondary | ICD-10-CM

## 2017-11-05 DIAGNOSIS — R972 Elevated prostate specific antigen [PSA]: Secondary | ICD-10-CM

## 2017-11-05 LAB — POCT URINALYSIS DIP (MANUAL ENTRY)
Blood, UA: NEGATIVE
Glucose, UA: NEGATIVE mg/dL
Leukocytes, UA: NEGATIVE
Nitrite, UA: NEGATIVE
Protein Ur, POC: 30 mg/dL — AB
Spec Grav, UA: 1.025 (ref 1.010–1.025)
Urobilinogen, UA: 1 U/dL
pH, UA: 6 (ref 5.0–8.0)

## 2017-11-05 NOTE — Progress Notes (Addendum)
Subjective:    Patient ID: Gregory Perry, male    DOB: 07-May-1951, 67 y.o.   MRN: [id number]    PCP: Gregory Becton, NP  Chief Complaint  Patient presents with  . Follow-up    3 month chronic condition    HPI  Gregory Perry has a history of Stroke, Hypertension, Headaches, Dysphagia, CVA, and Alcohol Abuse.   Current Status: She is doing well with no complaints. He denies fevers, chills, fatigue, recent infections, weight loss, and night sweats. He has recently been diagnosed with Cataracts. He has not had any headaches, dizziness, and falls.   He continues to smoke, but has decreased to 10 cigarettes a day. He reports no chest pain, heart palpitations, cough and shortness of breath reported. No reports of GI problems. He has no reports of blood in stools, dysuria and hematuria.   No depression or anxiety.   He has no pain today.   Past Medical History:  Diagnosis Date  . Alcohol abuse   . Candida esophagitis ([place])   . Cardiomyopathy ([place])   . CVA (cerebral infarction)   . Duodenal ulcer   . Dysphagia   . Headache(784.0)    history of  . Hypertension   . Severe protein-calorie malnutrition ([place])   . Stroke [facility])     Family History  Problem Relation Age of Onset  . Heart attack Maternal Grandfather     Social History   Socioeconomic History  . Marital status: Widowed    Spouse name: Not on file  . Number of children: Not on file  . Years of education: Not on file  . Highest education level: Not on file  Occupational History  . Not on file  Social Needs  . Financial resource strain: Not on file  . Food insecurity:    Worry: Not on file    Inability: Not on file  . Transportation needs:    Medical: Not on file    Non-medical: Not on file  Tobacco Use  . Smoking status: Current Every Day Smoker    Packs/day: 0.50    Years: 30.00    Pack years: 15.00    Types: Cigarettes  . Smokeless tobacco: Never Used  . Tobacco comment: Pt smokes 6 to 7 a day  trying to quit  Substance and Sexual Activity  . Alcohol use: No    Alcohol/week: 1.8 oz    Types: 3 Shots of liquor per week    Comment: quit in 10/2015   . Drug use: Yes    Types: Marijuana    Comment: occ  . Sexual activity: Not on file  Lifestyle  . Physical activity:    Days per week: Not on file    Minutes per session: Not on file  . Stress: Not on file  Relationships  . Social connections:    Talks on phone: Not on file    Gets together: Not on file    Attends religious service: Not on file    Active member of club or organization: Not on file    Attends meetings of clubs or organizations: Not on file    Relationship status: Not on file  . Intimate partner violence:    Fear of current or ex partner: Not on file    Emotionally abused: Not on file    Physically abused: Not on file    Forced sexual activity: Not on file  Other Topics Concern  . Not on file  Social History  Narrative  . Not on file   Past Surgical History:  Procedure Laterality Date  . BALLOON DILATION N/A 11/22/2015   Procedure: BALLOON DILATION;  Surgeon: Gregory Pole, MD;  Location: [facility];  Service: Endoscopy;  Laterality: N/A;  . CARDIAC CATHETERIZATION N/A 01/17/2016   Procedure: Left Heart Cath and Coronary Angiography;  Surgeon: Gregory Mocha, MD;  Location: [facility];  Service: Cardiovascular;  Laterality: N/A;  . DIRECT LARYNGOSCOPY  05/07/2011   Procedure: DIRECT LARYNGOSCOPY;  Surgeon: Gregory Nunnery, MD;  Location: [facility];  Service: ENT;  Laterality: N/A;  ESOPHAGOSCOPY  WITH DILATION  . EP IMPLANTABLE DEVICE N/A 11/17/2015   Procedure: Loop Recorder Insertion;  Surgeon: Gregory Lance, MD;  Location: [facility];  Service: Cardiovascular;  Laterality: N/A;  . ESOPHAGOGASTRODUODENOSCOPY  04/29/2011   Procedure: ESOPHAGOGASTRODUODENOSCOPY (EGD);  Surgeon: Gregory Cunas., MD;  Location: [facility];  Service: Endoscopy;  Laterality: N/A;  .  ESOPHAGOGASTRODUODENOSCOPY N/A 11/22/2015   Procedure: ESOPHAGOGASTRODUODENOSCOPY (EGD);  Surgeon: Gregory Pole, MD;  Location: [facility] END[facility];  Service: Endoscopy;  Laterality: N/A;  . LOOP RECORDER IMPLANT       There is no immunization history on file for this patient.  Current Meds  Medication Sig  . amLODipine (NORVASC) 2.5 MG tablet Take 1 tablet (2.5 mg total) by mouth daily.  Marland Kitchen atorvastatin (LIPITOR) 40 MG tablet TAKE 1 TABLET BY MOUTH DAILY AT 6 PM.  . Blood Pressure Monitoring (BLOOD PRESSURE CUFF) MISC Check blood pressure daily between 8 pm -9 pm  . lisinopril (PRINIVIL,ZESTRIL) 40 MG tablet Take 1 tablet (40 mg total) by mouth daily.  . pantoprazole (PROTONIX) 40 MG tablet TAKE 1 TABLET BY MOUTH 2 TIMES DAILY BEFORE A MEAL.  . [DISCONTINUED] metoprolol tartrate (LOPRESSOR) 25 MG tablet Take 1 tablet (25 mg total) by mouth 2 (two) times daily.   No Known Allergies  BP 124/86 (BP Location: Left Arm, Patient Position: Sitting, Cuff Size: Small)   Pulse 80   Temp 98 F (36.7 C) (Oral)   Ht 5\' 11"  (1.803 m)   Wt 168 lb (76.2 kg)   SpO2 100%   BMI 23.43 kg/m    Review of Systems  Constitutional: Negative.   HENT: Negative.   Eyes: Negative.   Respiratory: Positive for shortness of breath.   Gastrointestinal: Negative.   Endocrine: Negative.   Genitourinary: Negative.   Musculoskeletal: Negative.   Skin: Negative.   Allergic/Immunologic: Negative.   Neurological: Negative.   Hematological: Negative.   Psychiatric/Behavioral: Negative.    Objective:   Physical Exam  Constitutional: He is oriented to person, place, and time. He appears well-developed and well-nourished.  HENT:  Head: Normocephalic and atraumatic.  Right Ear: External ear normal.  Left Ear: External ear normal.  Nose: Nose normal.  Mouth/Throat: Oropharynx is clear and moist.  Eyes: Pupils are equal, round, and reactive to light. Conjunctivae and EOM are normal.  Neck: Normal range of  motion. Neck supple.  Cardiovascular: Normal rate, regular rhythm, normal heart sounds and intact distal pulses.  Pulmonary/Chest: Breath sounds normal.  Wheezes ascultated in left lower lung lobe  Abdominal: Bowel sounds are normal.  Musculoskeletal: Normal range of motion.  Neurological: He is alert and oriented to person, place, and time.  Skin: Skin is warm and dry. Capillary refill takes less than 2 seconds.  Psychiatric: He has a normal mood and affect. His behavior is normal. Judgment and thought content normal.  Nursing note and  vitals reviewed.  Assessment & Plan:   1. Essential hypertension Blood pressure is 124/86 today. He will continue Amlodipine, Lisinopril, and Metoprolol as prescribed. He will continue DASH diet, decreasing high-sugars in diet ad high-fat foods, increase water intake, and get at least 30 minutes of cardio activity daily.  - POCT urinalysis dipstick  2. Screening for diabetes mellitus Urinalysis revealed [place] and Proteins.   3. Hyperlipidemia, unspecified hyperlipidemia type Lipid panel was normal on 05/07/2017. He will continue Lipitor as prescribed.   4. Vision changes Recently diagnosed with Cataracts. He has an upcoming appointment for cataract surgery.   5. Elevated PSA, between 10 and less than 20 ng/ml We will refer him to Urology to further evaluate PSA of 11.9.  6. Follow up He will follow up in 3 months.   No orders of the defined types were placed in this encounter.  Gregory Becton,  MSN, FNP-BC [facility] [address] [place], [place] [address] [phone]

## 2017-11-07 ENCOUNTER — Ambulatory Visit (INDEPENDENT_AMBULATORY_CARE_PROVIDER_SITE_OTHER): Payer: Medicare Other | Admitting: *Deleted

## 2017-11-07 DIAGNOSIS — I639 Cerebral infarction, unspecified: Secondary | ICD-10-CM

## 2017-11-10 ENCOUNTER — Other Ambulatory Visit: Payer: Self-pay | Admitting: Family Medicine

## 2017-11-10 MED FILL — ?METOPROLOL 25 MG TABLET: 25 | 30 days supply | Qty: 60 | Fill #2

## 2017-11-10 NOTE — Addendum Note (Signed)
Addended by: Azzie Glatter on: 11/10/2017 09:22 PM   Modules accepted: Orders

## 2017-11-10 NOTE — Progress Notes (Signed)
Carelink Summary Report / Loop Recorder 

## 2017-11-11 ENCOUNTER — Telehealth: Payer: Self-pay

## 2017-11-11 NOTE — Telephone Encounter (Signed)
-----   Message from Gregory Perry, [place] sent at 11/10/2017  9:19 PM EDT ----- Regarding: "Lab Results" Please call patient and let him know that we will be referring him to Urology for evaluation of elevated PSA.   Thanks!

## 2017-11-11 NOTE — Telephone Encounter (Signed)
Patient notified

## 2017-11-11 NOTE — Telephone Encounter (Signed)
Left a vm for patient to callback 

## 2017-11-11 NOTE — Telephone Encounter (Signed)
-----   Message from Azzie Glatter, [place] sent at 11/10/2017  9:19 PM EDT ----- Regarding: "Lab Results" Please call patient and let him know that we will be referring him to Urology for evaluation of elevated PSA.   Thanks!

## 2017-11-14 ENCOUNTER — Telehealth: Payer: Self-pay | Admitting: *Deleted

## 2017-11-14 DIAGNOSIS — I480 Paroxysmal atrial fibrillation: Secondary | ICD-10-CM | POA: Insufficient documentation

## 2017-11-14 MED ORDER — APIXABAN 5 MG PO TABS: 5.0000 mg | ORAL_TABLET | Freq: Two times a day (BID) | ORAL | 10 refills | Status: DC

## 2017-11-14 MED ORDER — APIXABAN 5 MG PO TABS: 5.0000 mg | ORAL_TABLET | Freq: Two times a day (BID) | ORAL | 0 refills | Status: DC

## 2017-11-14 NOTE — Telephone Encounter (Signed)
Patient and sister, [name], returned call.  Discussed PAF noted on LINQ.  Patient is not currently taking ASA, other meds on list accurate (not taking potassium).  Patient agreeable to START Eliquis 5mg  BID.  Patient and sister educated about side effects (signs of bleeding), and educated to avoid NSAIDs without first discussing with MD.  Samples, 30 day trial card, and appointment calendar placed at front desk for pickup.  Patient confirmed [facility] as his preferred pharmacy.  Prescriptions sent electronically.  Patient agreeable to having BMET and CBC drawn on 12/11/17 and f/u appointment with Dr. Lovena Le on 12/15/17 at 10:45am.  Patient and sister aware of office address.  They deny additional questions or concerns at this time.

## 2017-11-14 NOTE — Telephone Encounter (Signed)
LMOVM for patient's sister, [facility]), requesting that patient call the [facility].  Gave direct number.

## 2017-11-14 NOTE — Telephone Encounter (Signed)
LMOVM for patient's sister, [name] ([place]), requesting that patient call the [facility].  Gave direct number.  New PAF noted on ILR.  Episode occurred on 11/12/17, duration 56min.  Per Dr. Lovena Le, plan to STOP ASA and START Eliquis 5mg  BID.  F/u with Dr. Lovena Le in 4-6 weeks with BMET/CBC prior to appointment.  Will make patient aware of recommendations.

## 2017-11-26 MED FILL — ?AMLODIPINE BESYLAT 2.5MG: 2.5 | 30 days supply | Qty: 30 | Fill #0

## 2017-11-26 MED FILL — ?ATORVASTATIN 40MG TABLET: 40 | 30 days supply | Qty: 30 | Fill #3

## 2017-11-26 MED FILL — LISINOPRIL 40 MG TABLET: 40 | 30 days supply | Qty: 30 | Fill #5

## 2017-12-01 ENCOUNTER — Encounter: Payer: Self-pay | Admitting: Internal Medicine

## 2017-12-03 MED FILL — ?PANTOPRAZOLE SO DR 40MG TA: 40 | 30 days supply | Qty: 60 | Fill #3

## 2017-12-10 ENCOUNTER — Ambulatory Visit (INDEPENDENT_AMBULATORY_CARE_PROVIDER_SITE_OTHER): Payer: Medicare Other | Admitting: *Deleted

## 2017-12-10 DIAGNOSIS — I639 Cerebral infarction, unspecified: Secondary | ICD-10-CM

## 2017-12-10 NOTE — Progress Notes (Signed)
Carelink Summary Report / Loop Recorder 

## 2017-12-11 ENCOUNTER — Other Ambulatory Visit: Payer: Medicare Other | Admitting: *Deleted

## 2017-12-11 ENCOUNTER — Other Ambulatory Visit: Payer: Self-pay | Admitting: Internal Medicine

## 2017-12-11 DIAGNOSIS — I480 Paroxysmal atrial fibrillation: Secondary | ICD-10-CM | POA: Diagnosis not present

## 2017-12-11 LAB — CBC WITH DIFFERENTIAL/PLATELET
BASOS ABS: 0 10*3/uL (ref 0.0–0.2)
BASOS: 0 %
EOS (ABSOLUTE): 0.2 10*3/uL (ref 0.0–0.4)
Eos: 3 %
Hematocrit: 29.4 % — ABNORMAL LOW (ref 37.5–51.0)
Hemoglobin: 9.9 g/dL — ABNORMAL LOW (ref 13.0–17.7)
IMMATURE GRANS (ABS): 0 10*3/uL (ref 0.0–0.1)
Immature Granulocytes: 0 %
LYMPHS: 50 %
Lymphocytes Absolute: 2.4 10*3/uL (ref 0.7–3.1)
MCH: 28.1 pg (ref 26.6–33.0)
MCHC: 33.7 g/dL (ref 31.5–35.7)
MCV: 84 fL (ref 79–97)
MONOS ABS: 0.4 10*3/uL (ref 0.1–0.9)
Monocytes: 8 %
NEUTROS ABS: 1.9 10*3/uL (ref 1.4–7.0)
Neutrophils: 39 %
PLATELETS: 294 10*3/uL (ref 150–450)
RBC: 3.52 x10E6/uL — ABNORMAL LOW (ref 4.14–5.80)
RDW: 16.1 % — AB (ref 12.3–15.4)
WBC: 4.8 10*3/uL (ref 3.4–10.8)

## 2017-12-11 LAB — BASIC METABOLIC PANEL
BUN/Creatinine Ratio: 8 — ABNORMAL LOW (ref 10–24)
BUN: 10 mg/dL (ref 8–27)
CALCIUM: 8.9 mg/dL (ref 8.6–10.2)
CO2: 24 mmol/L (ref 20–29)
Chloride: 99 mmol/L (ref 96–106)
Creatinine, Ser: 1.2 mg/dL (ref 0.76–1.27)
GFR, EST AFRICAN AMERICAN: 73 mL/min/{1.73_m2} (ref >59)
GFR, EST NON AFRICAN AMERICAN: 63 mL/min/{1.73_m2} (ref >59)
Glucose: 112 mg/dL — ABNORMAL HIGH (ref 65–99)
POTASSIUM: 3 mmol/L — AB (ref 3.5–5.2)
SODIUM: 142 mmol/L (ref 134–144)

## 2017-12-12 LAB — CUP PACEART REMOTE DEVICE CHECK
Implantable Pulse Generator Implant Date: 20170623
MDC IDC SESS DTM: 20190614120946

## 2017-12-15 ENCOUNTER — Encounter: Payer: Medicare Other | Admitting: Internal Medicine

## 2017-12-15 DIAGNOSIS — R0989 Other specified symptoms and signs involving the circulatory and respiratory systems: Secondary | ICD-10-CM

## 2017-12-16 ENCOUNTER — Encounter: Payer: Self-pay | Admitting: Internal Medicine

## 2017-12-22 ENCOUNTER — Other Ambulatory Visit: Payer: Self-pay

## 2017-12-22 MED ORDER — METOPROLOL TARTRATE 25 MG PO TABS: 25.0000 mg | ORAL_TABLET | Freq: Two times a day (BID) | ORAL | 1 refills | Status: DC

## 2017-12-22 MED FILL — METOPROLOL TARTRATE 25 MG T: 25 | 15 days supply | Qty: 30 | Fill #0

## 2017-12-22 NOTE — Telephone Encounter (Signed)
Medication refilled

## 2017-12-23 ENCOUNTER — Other Ambulatory Visit: Payer: Self-pay

## 2017-12-23 DIAGNOSIS — I110 Hypertensive heart disease with heart failure: Secondary | ICD-10-CM

## 2017-12-23 MED ORDER — SPIRONOLACTONE 25 MG PO TABS: 25.0000 mg | ORAL_TABLET | Freq: Every day | ORAL | 3 refills | Status: DC

## 2017-12-23 MED FILL — SPIRONOLACTONE 25 MG TABLET: 25 | 30 days supply | Qty: 30 | Fill #0

## 2017-12-30 MED FILL — ?AMLODIPINE BESYLAT 2.5MG: 2.5 | 30 days supply | Qty: 30 | Fill #1

## 2017-12-30 MED FILL — LISINOPRIL 40 MG TABLET: 40 | 30 days supply | Qty: 30 | Fill #0

## 2018-01-05 ENCOUNTER — Telehealth: Payer: Self-pay | Admitting: Internal Medicine

## 2018-01-05 ENCOUNTER — Other Ambulatory Visit: Payer: Medicare Other | Admitting: *Deleted

## 2018-01-05 DIAGNOSIS — E876 Hypokalemia: Secondary | ICD-10-CM

## 2018-01-05 DIAGNOSIS — I110 Hypertensive heart disease with heart failure: Secondary | ICD-10-CM

## 2018-01-05 LAB — BASIC METABOLIC PANEL
BUN / CREAT RATIO: 9 — AB (ref 10–24)
BUN: 10 mg/dL (ref 8–27)
CALCIUM: 9.3 mg/dL (ref 8.6–10.2)
CHLORIDE: 101 mmol/L (ref 96–106)
CO2: 25 mmol/L (ref 20–29)
CREATININE: 1.12 mg/dL (ref 0.76–1.27)
GFR calc Af Amer: 79 mL/min/{1.73_m2} (ref >59)
GFR calc non Af Amer: 69 mL/min/{1.73_m2} (ref >59)
GLUCOSE: 87 mg/dL (ref 65–99)
Potassium: 2.9 mmol/L — CL (ref 3.5–5.2)
Sodium: 142 mmol/L (ref 134–144)

## 2018-01-05 MED FILL — ?ATORVASTATIN 40MG TABLET: 40 | 30 days supply | Qty: 30 | Fill #4

## 2018-01-05 NOTE — Telephone Encounter (Signed)
Critical lab results called from [facility] letting us know that the patient's potassium was 2.9.

## 2018-01-05 NOTE — Telephone Encounter (Signed)
Agree with plan 

## 2018-01-05 NOTE — Telephone Encounter (Signed)
Attempted to contact patient to verify meds, but unable to reach patient. Will review with DOD and try again later.

## 2018-01-05 NOTE — Telephone Encounter (Signed)
New Message    Zigmund Daniel with [facility] is calling with a critical lab.

## 2018-01-05 NOTE — Telephone Encounter (Signed)
Attempted to contact patient again. Patient's sister Gregory Perry (DPR on file) answered and states that the patient is not home. She states that they both share a phone. Made her aware that the patient's potassium is low. She states that she does not know what medicines the patient is taking. Instructed for her to have the patient call our office so that we can verify patient's meds and give instructions for his low potassium. She states that she did not know if he would be back before the end of day to call today or not but she would have him call tomorrow if he was not.   According to the patient's med list, potassium was prescribed on 3/20 for 5 tablets, so I am not confident that the patient is taking this. The patient was instructed by Dr. Lovena Le to start spironolactone 25 mg QD on 7/30 for his low K on his BMET from 7/18. Patient also is suppose to be taking lisinopril 40 mg QD. Discussed with DOD Dr. Radford Pax and orders are as follows:  K-dur 20 mEq tablet: 1st dose- Take 2 tablets now 2nd dose- Take 2 tablets 4 hours after 1st dose 3rd dose- Take 1 tablet 4 hours after 2nd dose  THEN, BMET the next morning  Dr. Lovena Le to review after regarding management of patient's potassium.

## 2018-01-07 MED FILL — ?METOPROLOL TARTRATE 25 MG: 25 | 15 days supply | Qty: 30 | Fill #1

## 2018-01-07 MED FILL — ?PANTOPRAZOLE SO DR 40MG TA: 40 | 15 days supply | Qty: 30 | Fill #4

## 2018-01-08 NOTE — Telephone Encounter (Signed)
Spoke with Pt.  Pt has taken one dose of potassium today. Advised Pt to take another an additional potassium now. Then advised to take 2 more potassium tablets at 3 pm. Then advised Pt to take one more potassium tablet before bed.  Pt agrees to come to [facility] to repeat blood work.  Continue to monitor.

## 2018-01-09 ENCOUNTER — Other Ambulatory Visit: Payer: Medicare Other | Admitting: *Deleted

## 2018-01-09 ENCOUNTER — Encounter (INDEPENDENT_AMBULATORY_CARE_PROVIDER_SITE_OTHER): Payer: Self-pay

## 2018-01-09 DIAGNOSIS — E876 Hypokalemia: Secondary | ICD-10-CM

## 2018-01-09 LAB — BASIC METABOLIC PANEL
BUN/Creatinine Ratio: 7 — ABNORMAL LOW (ref 10–24)
BUN: 6 mg/dL — ABNORMAL LOW (ref 8–27)
CALCIUM: 9.3 mg/dL (ref 8.6–10.2)
CHLORIDE: 98 mmol/L (ref 96–106)
CO2: 32 mmol/L — AB (ref 20–29)
Creatinine, Ser: 0.92 mg/dL (ref 0.76–1.27)
GFR calc Af Amer: 101 mL/min/{1.73_m2} (ref >59)
GFR calc non Af Amer: 87 mL/min/{1.73_m2} (ref >59)
Glucose: 100 mg/dL — ABNORMAL HIGH (ref 65–99)
Potassium: 3 mmol/L — ABNORMAL LOW (ref 3.5–5.2)
Sodium: 140 mmol/L (ref 134–144)

## 2018-01-12 ENCOUNTER — Ambulatory Visit (INDEPENDENT_AMBULATORY_CARE_PROVIDER_SITE_OTHER): Payer: Medicare Other | Admitting: *Deleted

## 2018-01-12 DIAGNOSIS — I639 Cerebral infarction, unspecified: Secondary | ICD-10-CM

## 2018-01-13 NOTE — Progress Notes (Signed)
Carelink Summary Report / Loop Recorder 

## 2018-01-16 ENCOUNTER — Other Ambulatory Visit: Payer: Self-pay

## 2018-01-16 ENCOUNTER — Telehealth: Payer: Self-pay

## 2018-01-16 DIAGNOSIS — E876 Hypokalemia: Secondary | ICD-10-CM

## 2018-01-16 MED ORDER — POTASSIUM CHLORIDE CRYS ER 20 MEQ PO TBCR: 40.0000 meq | EXTENDED_RELEASE_TABLET | Freq: Every day | ORAL | 0 refills | Status: DC

## 2018-01-16 MED ORDER — POTASSIUM CHLORIDE CRYS ER 20 MEQ PO TBCR: 40.0000 meq | EXTENDED_RELEASE_TABLET | Freq: Every day | ORAL | 3 refills | Status: DC

## 2018-01-16 MED ORDER — SPIRONOLACTONE 25 MG PO TABS: 25.0000 mg | ORAL_TABLET | Freq: Every day | ORAL | 3 refills | Status: DC

## 2018-01-16 NOTE — Telephone Encounter (Signed)
-----   Message from Evans Lance, MD sent at 01/11/2018  5:06 PM EDT ----- Continue potassium supplement. Lets discuss next week.

## 2018-01-16 NOTE — Telephone Encounter (Signed)
Spoke with Pt.  Pt has been taking aldactone 25 mg daily. Pt takes one 20 meq potassium daily.  Discussed with Dr. Lovena Le.  Per Dr. Lovena Le- have Pt increase potassium 20 meq to two tablets by mouth daily. Repeat BMP in 2 weeks.  Pt scheduled for BMP 01/30/2018.

## 2018-01-16 NOTE — Progress Notes (Signed)
Reordered Aldactone 25 mg PO daily.  Await further needs.

## 2018-01-23 ENCOUNTER — Other Ambulatory Visit: Payer: Self-pay | Admitting: Family Medicine

## 2018-01-23 MED FILL — SPIRONOLACTONE 25 MG TABLET: 25 | 30 days supply | Qty: 30 | Fill #1

## 2018-01-23 MED FILL — METOPROLOL TARTRATE 25 MG T: 25 | 15 days supply | Qty: 30 | Fill #2

## 2018-01-23 MED FILL — PANTOPRAZOLE SOD DR 40 MG T: 40 | 30 days supply | Qty: 60 | Fill #0

## 2018-01-23 MED FILL — AMLODIPINE 2.5 MG TABLET: 2.5 | 30 days supply | Qty: 30 | Fill #2

## 2018-01-27 LAB — CUP PACEART REMOTE DEVICE CHECK
Implantable Pulse Generator Implant Date: 20170623
MDC IDC SESS DTM: 20190717120737

## 2018-01-30 ENCOUNTER — Other Ambulatory Visit: Payer: Medicare Other | Admitting: *Deleted

## 2018-01-30 DIAGNOSIS — E876 Hypokalemia: Secondary | ICD-10-CM | POA: Diagnosis not present

## 2018-01-30 LAB — BASIC METABOLIC PANEL
BUN/Creatinine Ratio: 13 (ref 10–24)
BUN: 14 mg/dL (ref 8–27)
CO2: 22 mmol/L (ref 20–29)
Calcium: 9.2 mg/dL (ref 8.6–10.2)
Chloride: 106 mmol/L (ref 96–106)
Creatinine, Ser: 1.07 mg/dL (ref 0.76–1.27)
GFR calc Af Amer: 84 mL/min/{1.73_m2} (ref >59)
GFR calc non Af Amer: 72 mL/min/{1.73_m2} (ref >59)
GLUCOSE: 105 mg/dL — AB (ref 65–99)
Potassium: 3.6 mmol/L (ref 3.5–5.2)
Sodium: 141 mmol/L (ref 134–144)

## 2018-02-03 MED FILL — LISINOPRIL 40 MG TABLET: 40 | 30 days supply | Qty: 30 | Fill #1

## 2018-02-06 ENCOUNTER — Ambulatory Visit: Payer: Medicare Other | Admitting: Family Medicine

## 2018-02-09 MED FILL — ?ATORVASTATIN 40MG TABLET: 40 | 30 days supply | Qty: 30 | Fill #5

## 2018-02-16 ENCOUNTER — Ambulatory Visit (INDEPENDENT_AMBULATORY_CARE_PROVIDER_SITE_OTHER): Payer: Medicare Other | Admitting: *Deleted

## 2018-02-16 DIAGNOSIS — I639 Cerebral infarction, unspecified: Secondary | ICD-10-CM | POA: Diagnosis not present

## 2018-02-16 LAB — CUP PACEART REMOTE DEVICE CHECK
Implantable Pulse Generator Implant Date: 20170623
MDC IDC SESS DTM: 20190819120844

## 2018-02-16 MED FILL — METOPROLOL TARTRATE 25 MG T: 25 | 15 days supply | Qty: 30 | Fill #3

## 2018-02-16 NOTE — Progress Notes (Signed)
Carelink Summary Report / Loop Recorder 

## 2018-02-23 LAB — CUP PACEART REMOTE DEVICE CHECK
Date Time Interrogation Session: 20190921123721
Implantable Pulse Generator Implant Date: 20170623

## 2018-03-02 MED FILL — SPIRONOLACTONE 25 MG TABLET: 25 | 30 days supply | Qty: 30 | Fill #2

## 2018-03-02 MED FILL — AMLODIPINE 2.5 MG TABLET: 2.5 | 30 days supply | Qty: 30 | Fill #3

## 2018-03-09 MED FILL — METOPROLOL TARTRATE 25 MG T: 25 | 15 days supply | Qty: 30 | Fill #4

## 2018-03-09 MED FILL — PANTOPRAZOLE SOD DR 40 MG T: 40 | 30 days supply | Qty: 60 | Fill #1

## 2018-03-16 DIAGNOSIS — R972 Elevated prostate specific antigen [PSA]: Secondary | ICD-10-CM | POA: Diagnosis not present

## 2018-03-16 MED FILL — LISINOPRIL 40 MG TABLET: 40 | 30 days supply | Qty: 30 | Fill #2

## 2018-03-18 ENCOUNTER — Encounter: Payer: Medicare Other | Admitting: Internal Medicine

## 2018-03-19 ENCOUNTER — Ambulatory Visit (INDEPENDENT_AMBULATORY_CARE_PROVIDER_SITE_OTHER): Payer: Medicare Other | Admitting: *Deleted

## 2018-03-19 DIAGNOSIS — I428 Other cardiomyopathies: Secondary | ICD-10-CM | POA: Diagnosis not present

## 2018-03-19 NOTE — Progress Notes (Signed)
Carelink Summary Report / Loop Recorder 

## 2018-03-23 ENCOUNTER — Ambulatory Visit: Payer: Medicare Other

## 2018-03-23 MED FILL — ATORVASTATIN CALCIUM 40 MG: 40 | 30 days supply | Qty: 30 | Fill #6

## 2018-03-26 ENCOUNTER — Encounter

## 2018-03-26 ENCOUNTER — Encounter: Payer: Medicare Other | Admitting: Internal Medicine

## 2018-03-27 ENCOUNTER — Encounter: Payer: Self-pay | Admitting: Internal Medicine

## 2018-04-03 LAB — CUP PACEART REMOTE DEVICE CHECK
Date Time Interrogation Session: 20191024123728
MDC IDC PG IMPLANT DT: 20170623

## 2018-04-06 ENCOUNTER — Ambulatory Visit: Payer: Medicare Other | Attending: Family Medicine

## 2018-04-06 MED FILL — ?AMLODIPINE BESYLAT 2.5MG: 2.5 | 30 days supply | Qty: 30 | Fill #4

## 2018-04-06 MED FILL — ?METOPROLOL TARTRATE 25 MG: 25 | 15 days supply | Qty: 30 | Fill #5

## 2018-04-06 MED FILL — SPIRONOLACTONE 25 MG TABLET: 25 | 30 days supply | Qty: 30 | Fill #3

## 2018-04-07 ENCOUNTER — Telehealth: Payer: Self-pay | Admitting: *Deleted

## 2018-04-07 MED FILL — levoFLOXacin 750 MG TABS: 750 | 1 days supply | Qty: 1 | Fill #0

## 2018-04-07 NOTE — Telephone Encounter (Signed)
Dr. Lyndal Rainbow office called back and they will be a Local anesthesia-Lidocaine.

## 2018-04-07 NOTE — Telephone Encounter (Signed)
Patient with diagnosis of atrial fibrillation on Eliquis for anticoagulation.    Procedure: prostate biopsy Date of procedure: TBD  CHADS2-VASc score of 6 (CHF, HTN, AGE, , stroke/tia x 2, CAD, )  CrCl 74.2 Platelet count 294  Would recommend that he hold Eliquis for 2 days, but with history of cryptogenic stroke (2017), will confirm with Dr. Lovena Le.

## 2018-04-07 NOTE — Telephone Encounter (Signed)
   [facility] Pre-operative Risk Assessment    Request for surgical clearance:  1. What type of surgery is being performed? PROSTATE BIOPSY    2. When is this surgery scheduled? TBD   3. Are there any medications that need to be held prior to surgery and how long? ELIQUIS 2 DAYS PRIOR   4. Practice name and name of physician performing surgery? [facility]; DR. Junious Silk   5. What is your office phone and fax number? PH# [phone]; FAX# [phone]   0. Anesthesia type (None, local, MAC, general) ? LEFT MESSAGE FOR DR. Lyndal Rainbow OFFICE TO CALL BACK WITH TYPE OF ANESTHESIA.    Julaine Hua 04/07/2018, 11:44 AM  _________________________________________________________________   (provider comments below)

## 2018-04-08 MED FILL — LISINOPRIL 40 MG TABLET: 40 | 30 days supply | Qty: 30 | Fill #3

## 2018-04-13 NOTE — Telephone Encounter (Signed)
Will forward to Dr. Lovena Le to review Pharmacist's recommendation.

## 2018-04-19 NOTE — Telephone Encounter (Signed)
Ok to proceed with his procedure. He may hold his eliquis for 2 days before procedure and restart when ok with his surgeon.  GT

## 2018-04-20 NOTE — Telephone Encounter (Signed)
I tried to call him twice, both times went straight to voicemail. I left him message to call us back to speak to the on call preop APP. Low local anesthesia and low risk nature of procedure, unless he complains of severe issue, plan to clear

## 2018-04-21 ENCOUNTER — Telehealth: Payer: Self-pay | Admitting: Internal Medicine

## 2018-04-21 ENCOUNTER — Ambulatory Visit (INDEPENDENT_AMBULATORY_CARE_PROVIDER_SITE_OTHER): Payer: Medicare Other

## 2018-04-21 DIAGNOSIS — I639 Cerebral infarction, unspecified: Secondary | ICD-10-CM

## 2018-04-21 NOTE — Telephone Encounter (Signed)
Follow Up:      Pt said his phone is not working. Pt says he will call back after 1:30.

## 2018-04-22 ENCOUNTER — Ambulatory Visit (INDEPENDENT_AMBULATORY_CARE_PROVIDER_SITE_OTHER): Payer: Medicare Other | Admitting: Internal Medicine

## 2018-04-22 ENCOUNTER — Encounter: Payer: Self-pay | Admitting: Internal Medicine

## 2018-04-22 VITALS — BP 124/82 | HR 62 | Ht 71.0 in | Wt 157.0 lb

## 2018-04-22 DIAGNOSIS — I2510 Atherosclerotic heart disease of native coronary artery without angina pectoris: Secondary | ICD-10-CM

## 2018-04-22 DIAGNOSIS — I639 Cerebral infarction, unspecified: Secondary | ICD-10-CM | POA: Diagnosis not present

## 2018-04-22 DIAGNOSIS — I480 Paroxysmal atrial fibrillation: Secondary | ICD-10-CM

## 2018-04-22 DIAGNOSIS — I420 Dilated cardiomyopathy: Secondary | ICD-10-CM | POA: Diagnosis not present

## 2018-04-22 NOTE — Patient Instructions (Signed)
Medication Instructions:  Your physician recommends that you continue on your current medications as directed. Please refer to the Current Medication list given to you today.  Labwork: None ordered.  Testing/Procedures: None ordered.  Follow-Up: Your physician wants you to follow-up in: one year with Dr. Lovena Le.   You will receive a reminder letter in the mail two months in advance. If you don't receive a letter, please call our office to schedule the follow-up appointment.  Monthly remote checks for your loop recorder  Any Other Special Instructions Will Be Listed Below (If Applicable).  If you need a refill on your cardiac medications before your next appointment, please call your pharmacy.

## 2018-04-22 NOTE — Progress Notes (Signed)
Carelink Summary Report / Loop Recorder 

## 2018-04-22 NOTE — Progress Notes (Signed)
HPI Mr. Mathey returns today for followup of cryptogenic stroke, s/p ILR with subsequent diagnosis of atrial fib. He is a pleasant 67 yo man with a h/o as above. He is pending prostate biopsy.  No Known Allergies   Current Outpatient Medications  Medication Sig Dispense Refill  . amLODipine (NORVASC) 2.5 MG tablet Take 1 tablet (2.5 mg total) by mouth daily. 90 tablet 2  . apixaban (ELIQUIS) 5 MG TABS tablet Take 1 tablet (5 mg total) by mouth 2 (two) times daily. 60 tablet 10  . atorvastatin (LIPITOR) 40 MG tablet TAKE 1 TABLET BY MOUTH DAILY AT 6 PM. 90 tablet 2  . Blood Pressure Monitoring (BLOOD PRESSURE CUFF) MISC Check blood pressure daily between 8 pm -9 pm 1 each 0  . lisinopril (PRINIVIL,ZESTRIL) 40 MG tablet Take 1 tablet (40 mg total) by mouth daily. 90 tablet 1  . metoprolol tartrate (LOPRESSOR) 25 MG tablet Take 1 tablet (25 mg total) by mouth 2 (two) times daily. 60 tablet 1  . pantoprazole (PROTONIX) 40 MG tablet TAKE 1 TABLET BY MOUTH 2 TIMES DAILY BEFORE A MEAL. 90 tablet 2  . potassium chloride SA (K-DUR,KLOR-CON) 20 MEQ tablet Take 2 tablets (40 mEq total) by mouth daily. 180 tablet 3  . spironolactone (ALDACTONE) 25 MG tablet Take 1 tablet (25 mg total) by mouth daily. 90 tablet 3   No current facility-administered medications for this visit.      Past Medical History:  Diagnosis Date  . Alcohol abuse   . Candida esophagitis ([place])   . Cardiomyopathy ([place])   . CVA (cerebral infarction)   . Duodenal ulcer   . Dysphagia   . Headache(784.0)    history of  . Hypertension   . Severe protein-calorie malnutrition ([place])   . Stroke ([place])     ROS:   All systems reviewed and negative except as noted in the HPI.   Past Surgical History:  Procedure Laterality Date  . BALLOON DILATION N/A 11/22/2015   Procedure: BALLOON DILATION;  Surgeon: Mauri Pole, MD;  Location: [facility];  Service: Endoscopy;  Laterality: N/A;  . CARDIAC CATHETERIZATION N/A  01/17/2016   Procedure: Left Heart Cath and Coronary Angiography;  Surgeon: Sherren Mocha, MD;  Location: [facility];  Service: Cardiovascular;  Laterality: N/A;  . DIRECT LARYNGOSCOPY  05/07/2011   Procedure: DIRECT LARYNGOSCOPY;  Surgeon: Rozetta Nunnery, MD;  Location: [facility];  Service: ENT;  Laterality: N/A;  ESOPHAGOSCOPY  WITH DILATION  . EP IMPLANTABLE DEVICE N/A 11/17/2015   Procedure: Loop Recorder Insertion;  Surgeon: Evans Lance, MD;  Location: [facility];  Service: Cardiovascular;  Laterality: N/A;  . ESOPHAGOGASTRODUODENOSCOPY  04/29/2011   Procedure: ESOPHAGOGASTRODUODENOSCOPY (EGD);  Surgeon: Winfield Cunas., MD;  Location: [facility];  Service: Endoscopy;  Laterality: N/A;  . ESOPHAGOGASTRODUODENOSCOPY N/A 11/22/2015   Procedure: ESOPHAGOGASTRODUODENOSCOPY (EGD);  Surgeon: Mauri Pole, MD;  Location: [facility];  Service: Endoscopy;  Laterality: N/A;  . LOOP RECORDER IMPLANT       Family History  Problem Relation Age of Onset  . Heart attack Maternal Grandfather      Social History   Socioeconomic History  . Marital status: Widowed    Spouse name: Not on file  . Number of children: Not on file  . Years of education: Not on file  . Highest education level: Not on file  Occupational History  . Not on file  Social Needs  .  Financial resource strain: Not on file  . Food insecurity:    Worry: Not on file    Inability: Not on file  . Transportation needs:    Medical: Not on file    Non-medical: Not on file  Tobacco Use  . Smoking status: Current Every Day Smoker    Packs/day: 0.50    Years: 30.00    Pack years: 15.00    Types: Cigarettes  . Smokeless tobacco: Never Used  . Tobacco comment: Pt smokes 6 to 7 a day trying to quit  Substance and Sexual Activity  . Alcohol use: No    Alcohol/week: 3.0 standard drinks    Types: 3 Shots of liquor per week    Comment: quit in 10/2015   . Drug use: Yes    Types: Marijuana     Comment: occ  . Sexual activity: Not on file  Lifestyle  . Physical activity:    Days per week: Not on file    Minutes per session: Not on file  . Stress: Not on file  Relationships  . Social connections:    Talks on phone: Not on file    Gets together: Not on file    Attends religious service: Not on file    Active member of club or organization: Not on file    Attends meetings of clubs or organizations: Not on file    Relationship status: Not on file  . Intimate partner violence:    Fear of current or ex partner: Not on file    Emotionally abused: Not on file    Physically abused: Not on file    Forced sexual activity: Not on file  Other Topics Concern  . Not on file  Social History Narrative  . Not on file     BP 124/82   Pulse 62   Ht 5\' 11"  (1.803 m)   Wt 157 lb (71.2 kg)   BMI 21.90 kg/m   Physical Exam:  Well appearing NAD HEENT: Unremarkable Neck:  No JVD, no thyromegally Lymphatics:  No adenopathy Back:  No CVA tenderness Lungs:  Clear with no wheezes HEART:  Regular rate rhythm, no murmurs, no rubs, no clicks Abd:  soft, positive bowel sounds, no organomegally, no rebound, no guarding Ext:  2 plus pulses, no edema, no cyanosis, no clubbing Skin:  No rashes no nodules Neuro:  CN II through XII intact, motor grossly intact  ECG - NSR with LVH  DEVICE  Normal device function.  See PaceArt for details.   Assess/Plan: 1. Preoperative eval - he is stable for biopsy. He can hold his systemic anti-coagulation as needed before and after for the procedure. 2. PAF - he is mostly maintaining NSR. 3. HTN - his blood pressure today is well controlled.  Mikle Bosworth.D.

## 2018-04-25 LAB — CUP PACEART INCLINIC DEVICE CHECK
Date Time Interrogation Session: 20191127140227
MDC IDC PG IMPLANT DT: 20170623

## 2018-04-28 MED FILL — METOPROLOL TARTRATE 25 MG T: 25 | 30 days supply | Qty: 60 | Fill #0

## 2018-04-28 MED FILL — PANTOPRAZOLE SOD DR 40 MG T: 40 | 30 days supply | Qty: 60 | Fill #2

## 2018-04-28 NOTE — Telephone Encounter (Signed)
   Primary Cardiologist: Cristopher Peru, MD  Chart reviewed as part of pre-operative protocol coverage. Pt was seen by Dr. Lovena Le on 04/22/18 who stated, "Preoperative eval - he is stable for biopsy. He can hold his systemic anti-coagulation as needed before and after for the procedure." This was previously outlined in another note by Dr. Lovena Le indicating "He may hold his eliquis for 2 days before procedure and restart when ok with his surgeon. "  I will route this recommendation to the requesting party via [facility] fax function and remove from pre-op pool.  Please call with questions.  Charlie Pitter, PA-C 04/28/2018, 2:28 PM

## 2018-05-12 MED FILL — ATORVASTATIN CALCIUM 40 MG: 40 | 30 days supply | Qty: 30 | Fill #7

## 2018-05-12 MED FILL — SPIRONOLACTONE 25 MG TABLET: 25 | 30 days supply | Qty: 30 | Fill #4

## 2018-05-13 MED FILL — ?AMLODIPINE BESYLAT 2.5MG: 2.5 | 30 days supply | Qty: 30 | Fill #5

## 2018-05-25 ENCOUNTER — Ambulatory Visit (INDEPENDENT_AMBULATORY_CARE_PROVIDER_SITE_OTHER): Payer: Medicare Other

## 2018-05-25 DIAGNOSIS — I639 Cerebral infarction, unspecified: Secondary | ICD-10-CM | POA: Diagnosis not present

## 2018-05-25 MED FILL — LISINOPRIL 40 MG TABLET: 40 | 30 days supply | Qty: 30 | Fill #4

## 2018-05-25 NOTE — Progress Notes (Signed)
Carelink Summary Report / Loop Recorder 

## 2018-05-26 LAB — CUP PACEART REMOTE DEVICE CHECK
Implantable Pulse Generator Implant Date: 20170623
MDC IDC SESS DTM: 20191229144035

## 2018-05-28 ENCOUNTER — Other Ambulatory Visit: Payer: Self-pay | Admitting: Internal Medicine

## 2018-06-07 LAB — CUP PACEART REMOTE DEVICE CHECK
Date Time Interrogation Session: 20191126134025
Implantable Pulse Generator Implant Date: 20170623

## 2018-06-16 MED FILL — SPIRONOLACTONE 25 MG TABLET: 25 | 30 days supply | Qty: 30 | Fill #5

## 2018-06-16 MED FILL — AMLODIPINE 2.5 MG TABLET: 2.5 | 30 days supply | Qty: 30 | Fill #6

## 2018-06-16 MED FILL — ?PANTOPRAZOLE SO DR 40MG TA: 40 | 30 days supply | Qty: 60 | Fill #3

## 2018-06-16 MED FILL — ?METOPROLOL TARTRATE 25 MG: 25 | 30 days supply | Qty: 60 | Fill #1

## 2018-06-26 ENCOUNTER — Ambulatory Visit (INDEPENDENT_AMBULATORY_CARE_PROVIDER_SITE_OTHER): Payer: Medicare Other

## 2018-06-26 DIAGNOSIS — I639 Cerebral infarction, unspecified: Secondary | ICD-10-CM

## 2018-06-26 LAB — CUP PACEART REMOTE DEVICE CHECK
Date Time Interrogation Session: 20200131151013
MDC IDC PG IMPLANT DT: 20170623

## 2018-06-29 MED FILL — ATORVASTATIN CALCIUM 40 MG: 40 | 30 days supply | Qty: 30 | Fill #8

## 2018-06-29 MED FILL — LISINOPRIL 40 MG TABLET: 40 | 30 days supply | Qty: 30 | Fill #5

## 2018-07-06 NOTE — Progress Notes (Signed)
Carelink Summary Report / Loop Recorder 

## 2018-07-23 ENCOUNTER — Other Ambulatory Visit: Payer: Self-pay | Admitting: Family Medicine

## 2018-07-23 DIAGNOSIS — E785 Hyperlipidemia, unspecified: Secondary | ICD-10-CM

## 2018-07-23 MED FILL — ?AMLODIPINE BESYLATE 2.5MG.: 2.5 | 30 days supply | Qty: 30 | Fill #7

## 2018-07-23 MED FILL — ?PANTOPRAZOLE SOD DR 40MG T: 40 | 15 days supply | Qty: 30 | Fill #4

## 2018-07-23 MED FILL — SPIRONOLACTONE 25 MG TABLET: 25 | 30 days supply | Qty: 30 | Fill #6

## 2018-07-28 ENCOUNTER — Telehealth: Payer: Self-pay

## 2018-07-28 NOTE — Telephone Encounter (Signed)
-----   Message from Azzie Glatter, FNP sent at 07/23/2018 11:30 AM EST ----- Regarding: "Follow Up" Patient is requesting refills.   Please contact patient to remind him that it is important for him to follow up. Possibly transportation problems? Remind him that he needs to be assessed in office with labs, blood pressures, etc in order to continue to get refills on medications to ensure that his meds continue to be safe for him. Please schedule patient for follow up appointment as soon as he can come in to office. Assess if patient is out of medications and refill # of tablets until his appointment date.Thank you.

## 2018-07-28 NOTE — Telephone Encounter (Signed)
Left a vm for patient to call and schedule appointment

## 2018-07-29 ENCOUNTER — Ambulatory Visit (INDEPENDENT_AMBULATORY_CARE_PROVIDER_SITE_OTHER): Payer: Medicare Other | Admitting: *Deleted

## 2018-07-29 DIAGNOSIS — I639 Cerebral infarction, unspecified: Secondary | ICD-10-CM

## 2018-07-29 LAB — CUP PACEART REMOTE DEVICE CHECK
Date Time Interrogation Session: 20200304150424
Implantable Pulse Generator Implant Date: 20170623

## 2018-07-29 NOTE — Telephone Encounter (Signed)
-----   Message from Azzie Glatter, FNP sent at 07/23/2018 11:30 AM EST ----- Regarding: "Follow Up" Patient is requesting refills.   Please contact patient to remind him that it is important for him to follow up. Possibly transportation problems? Remind him that he needs to be assessed in office with labs, blood pressures, etc in order to continue to get refills on medications to ensure that his meds continue to be safe for him. Please schedule patient for follow up appointment as soon as he can come in to office. Assess if patient is out of medications and refill # of tablets until his appointment date.Thank you.

## 2018-07-29 NOTE — Telephone Encounter (Signed)
Patient scheduled.

## 2018-07-31 ENCOUNTER — Other Ambulatory Visit: Payer: Self-pay | Admitting: Family Medicine

## 2018-07-31 ENCOUNTER — Other Ambulatory Visit: Payer: Self-pay

## 2018-08-03 ENCOUNTER — Encounter: Payer: Self-pay | Admitting: Family Medicine

## 2018-08-03 ENCOUNTER — Ambulatory Visit (INDEPENDENT_AMBULATORY_CARE_PROVIDER_SITE_OTHER): Payer: Medicare Other | Admitting: Family Medicine

## 2018-08-03 VITALS — BP 124/70 | HR 88 | Temp 98.2°F | Ht 71.0 in | Wt 154.0 lb

## 2018-08-03 DIAGNOSIS — E785 Hyperlipidemia, unspecified: Secondary | ICD-10-CM | POA: Diagnosis not present

## 2018-08-03 DIAGNOSIS — Z8639 Personal history of other endocrine, nutritional and metabolic disease: Secondary | ICD-10-CM

## 2018-08-03 DIAGNOSIS — Z1321 Encounter for screening for nutritional disorder: Secondary | ICD-10-CM

## 2018-08-03 DIAGNOSIS — I10 Essential (primary) hypertension: Secondary | ICD-10-CM | POA: Diagnosis not present

## 2018-08-03 DIAGNOSIS — Z09 Encounter for follow-up examination after completed treatment for conditions other than malignant neoplasm: Secondary | ICD-10-CM | POA: Diagnosis not present

## 2018-08-03 DIAGNOSIS — I480 Paroxysmal atrial fibrillation: Secondary | ICD-10-CM

## 2018-08-03 DIAGNOSIS — R972 Elevated prostate specific antigen [PSA]: Secondary | ICD-10-CM | POA: Diagnosis not present

## 2018-08-03 DIAGNOSIS — Z0000 Encounter for general adult medical examination without abnormal findings: Secondary | ICD-10-CM | POA: Diagnosis not present

## 2018-08-03 DIAGNOSIS — H539 Unspecified visual disturbance: Secondary | ICD-10-CM

## 2018-08-03 MED ORDER — ATORVASTATIN CALCIUM 40 MG PO TABS: ORAL_TABLET | ORAL | 3 refills | Status: DC

## 2018-08-03 MED ORDER — AMLODIPINE BESYLATE 2.5 MG PO TABS: 2.5000 mg | ORAL_TABLET | Freq: Every day | ORAL | 3 refills | Status: DC

## 2018-08-03 MED ORDER — SPIRONOLACTONE 25 MG PO TABS: 25.0000 mg | ORAL_TABLET | Freq: Every day | ORAL | 3 refills | Status: DC

## 2018-08-03 MED ORDER — LISINOPRIL 40 MG PO TABS: 40.0000 mg | ORAL_TABLET | Freq: Every day | ORAL | 3 refills | Status: DC

## 2018-08-03 MED ORDER — APIXABAN 5 MG PO TABS: 5.0000 mg | ORAL_TABLET | Freq: Two times a day (BID) | ORAL | 3 refills | Status: DC

## 2018-08-03 MED ORDER — METOPROLOL TARTRATE 25 MG PO TABS: 25.0000 mg | ORAL_TABLET | Freq: Two times a day (BID) | ORAL | 3 refills | Status: DC

## 2018-08-03 MED FILL — !ELIQUIS 5 MG TABLET: 5 | 30 days supply | Qty: 60 | Fill #0

## 2018-08-03 MED FILL — ?ATORVASTATIN 40MG TABLET: 40 | 30 days supply | Qty: 30 | Fill #0

## 2018-08-03 MED FILL — ?METOPROLOL TARTRATE 25 MG: 25 | 30 days supply | Qty: 60 | Fill #0

## 2018-08-03 MED FILL — LISINOPRIL 40 MG TABLET: 40 | 30 days supply | Qty: 30 | Fill #0

## 2018-08-03 NOTE — Progress Notes (Signed)
Patient [name]  Established Patient Office Visit  Subjective:  Patient ID: Gregory Perry, male    DOB: [birth year]  Age: 68 y.o. MRN: [id number]  CC:  Chief Complaint  Patient presents with  . Follow-up    chronic conditon   . A1C    5.5   HPI Gregory Perry is a 67 year old male who presents for follow up today.   Past Medical History:  Diagnosis Date  . Alcohol abuse   . Candida esophagitis ([place])   . Cardiomyopathy ([place])   . CVA (cerebral infarction)   . Duodenal ulcer   . Dysphagia   . Headache(784.0)    history of  . Hypertension   . Severe protein-calorie malnutrition ([place])   . Stroke [facility])    Current Status: Since his last office visit, he continues to work on quitting smoking with about 8-10 cigarettes daily. He denies visual changes, chest pain, cough, shortness of breath, heart palpitations, and falls. He has occasional headaches and dizziness with position changes. Denies severe headaches, confusion, seizures, double vision, and blurred vision, nausea and vomiting. He has c/o visual cloudiness, especially at night. He did not follow up with Optometry for recent diagnosis of Cataracts.    He denies fevers, chills, fatigue, recent infections, weight loss, and night sweats. No reports of GI problems such as diarrhea, and constipation. He has no reports of blood in stools, dysuria and hematuria. No depression or anxiety reported. He denies pain today.   Past Surgical History:  Procedure Laterality Date  . BALLOON DILATION N/A 11/22/2015   Procedure: BALLOON DILATION;  Surgeon: Mauri Pole, MD;  Location: [facility];  Service: Endoscopy;  Laterality: N/A;  . CARDIAC CATHETERIZATION N/A 01/17/2016   Procedure: Left Heart Cath and Coronary Angiography;  Surgeon: Sherren Mocha, MD;  Location: [facility];  Service: Cardiovascular;  Laterality: N/A;  . DIRECT LARYNGOSCOPY  05/07/2011   Procedure: DIRECT  LARYNGOSCOPY;  Surgeon: Rozetta Nunnery, MD;  Location: [facility];  Service: ENT;  Laterality: N/A;  ESOPHAGOSCOPY  WITH DILATION  . EP IMPLANTABLE DEVICE N/A 11/17/2015   Procedure: Loop Recorder Insertion;  Surgeon: Evans Lance, MD;  Location: [facility];  Service: Cardiovascular;  Laterality: N/A;  . ESOPHAGOGASTRODUODENOSCOPY  04/29/2011   Procedure: ESOPHAGOGASTRODUODENOSCOPY (EGD);  Surgeon: Winfield Cunas., MD;  Location: [facility];  Service: Endoscopy;  Laterality: N/A;  . ESOPHAGOGASTRODUODENOSCOPY N/A 11/22/2015   Procedure: ESOPHAGOGASTRODUODENOSCOPY (EGD);  Surgeon: Mauri Pole, MD;  Location: [facility];  Service: Endoscopy;  Laterality: N/A;  . LOOP RECORDER IMPLANT      Family History  Problem Relation Age of Onset  . Heart attack Maternal Grandfather     Social History   Socioeconomic History  . Marital status: Widowed    Spouse name: Not on file  . Number of children: Not on file  . Years of education: Not on file  . Highest education level: Not on file  Occupational History  . Not on file  Social Needs  . Financial resource strain: Not on file  . Food insecurity:    Worry: Not on file    Inability: Not on file  . Transportation needs:    Medical: Not on file    Non-medical: Not on file  Tobacco Use  . Smoking status: Current Every Day Smoker    Packs/day: 0.50    Years: 30.00    Pack years:  15.00    Types: Cigarettes  . Smokeless tobacco: Never Used  . Tobacco comment: Pt smokes 6 to 7 a day trying to quit  Substance and Sexual Activity  . Alcohol use: No    Alcohol/week: 3.0 standard drinks    Types: 3 Shots of liquor per week    Comment: quit in 10/2015   . Drug use: Yes    Types: Marijuana    Comment: occ  . Sexual activity: Not on file  Lifestyle  . Physical activity:    Days per week: Not on file    Minutes per session: Not on file  . Stress: Not on file  Relationships  . Social connections:    Talks on phone: Not  on file    Gets together: Not on file    Attends religious service: Not on file    Active member of club or organization: Not on file    Attends meetings of clubs or organizations: Not on file    Relationship status: Not on file  . Intimate partner violence:    Fear of current or ex partner: Not on file    Emotionally abused: Not on file    Physically abused: Not on file    Forced sexual activity: Not on file  Other Topics Concern  . Not on file  Social History Narrative  . Not on file    Outpatient Medications Prior to Visit  Medication Sig Dispense Refill  . Blood Pressure Monitoring (BLOOD PRESSURE CUFF) MISC Check blood pressure daily between 8 pm -9 pm 1 each 0  . pantoprazole (PROTONIX) 40 MG tablet TAKE 1 TABLET BY MOUTH 2 TIMES DAILY BEFORE A MEAL. 90 tablet 2  . potassium chloride SA (K-DUR,KLOR-CON) 20 MEQ tablet Take 2 tablets (40 mEq total) by mouth daily. 180 tablet 3  . amLODipine (NORVASC) 2.5 MG tablet Take 1 tablet (2.5 mg total) by mouth daily. 90 tablet 2  . apixaban (ELIQUIS) 5 MG TABS tablet Take 1 tablet (5 mg total) by mouth 2 (two) times daily. 60 tablet 10  . atorvastatin (LIPITOR) 40 MG tablet TAKE 1 TABLET BY MOUTH DAILY AT 6 PM. 90 tablet 2  . lisinopril (PRINIVIL,ZESTRIL) 40 MG tablet Take 1 tablet (40 mg total) by mouth daily. 90 tablet 1  . metoprolol tartrate (LOPRESSOR) 25 MG tablet Take 1 tablet (25 mg total) by mouth 2 (two) times daily. 60 tablet 1  . spironolactone (ALDACTONE) 25 MG tablet Take 1 tablet (25 mg total) by mouth daily. 90 tablet 3   No facility-administered medications prior to visit.     No Known Allergies  ROS Review of Systems  Constitutional: Negative.   HENT: Negative.   Eyes: Positive for visual disturbance (Cataracts).  Respiratory: Negative.   Cardiovascular: Negative.   Gastrointestinal: Negative.   Endocrine: Negative.   Genitourinary: Negative.   Musculoskeletal: Positive for arthralgias (generalized).  Skin:  Negative.   Allergic/Immunologic: Negative.   Neurological: Positive for dizziness and headaches.  Hematological: Negative.   Psychiatric/Behavioral: Negative.    Objective:    Physical Exam  Constitutional: He is oriented to person, place, and time. He appears well-developed and well-nourished.  HENT:  Head: Normocephalic and atraumatic.  Eyes: Conjunctivae are normal.  Neck: Normal range of motion. Neck supple.  Cardiovascular: Normal rate, regular rhythm, normal heart sounds and intact distal pulses.  Pulmonary/Chest: Effort normal and breath sounds normal.  Abdominal: Soft. Bowel sounds are normal.  Musculoskeletal: Normal range of motion.  Neurological:  He is alert and oriented to person, place, and time. He has normal reflexes.  Skin: Skin is warm and dry.  Psychiatric: He has a normal mood and affect. His behavior is normal. Judgment and thought content normal.  Nursing note and vitals reviewed.  BP 124/70 (BP Location: Left Arm, Patient Position: Sitting, Cuff Size: Small)   Pulse 88   Temp 98.2 F (36.8 C) (Oral)   Ht 5\' 11"  (1.803 m)   Wt 154 lb (69.9 kg)   SpO2 100%   BMI 21.48 kg/m  Wt Readings from Last 3 Encounters:  08/03/18 154 lb (69.9 kg)  04/22/18 157 lb (71.2 kg)  11/05/17 168 lb (76.2 kg)     Health Maintenance Due  Topic Date Due  . TETANUS/TDAP  09/27/1971  . COLONOSCOPY  09/27/2002  . PNA vac Low Risk Adult (1 of 2 - PCV13) 09/26/2017  . INFLUENZA VACCINE  12/25/2017    There are no preventive care reminders to display for this patient.  Lab Results  Component Value Date   TSH 0.444 (L) 08/03/2018   Lab Results  Component Value Date   WBC 4.5 08/03/2018   HGB 8.3 (L) 08/03/2018   HCT 29.3 (L) 08/03/2018   MCV 73 (L) 08/03/2018   PLT 423 08/03/2018   Lab Results  Component Value Date   NA 145 (H) 08/03/2018   K 3.7 08/03/2018   CO2 22 08/03/2018   GLUCOSE 90 08/03/2018   BUN 12 08/03/2018   CREATININE 0.99 08/03/2018    BILITOT 0.6 08/03/2018   ALKPHOS 81 08/03/2018   AST 24 08/03/2018   ALT 10 08/03/2018   PROT 7.8 08/03/2018   ALBUMIN 4.2 08/03/2018   CALCIUM 9.7 08/03/2018   ANIONGAP 5 01/17/2016   Lab Results  Component Value Date   CHOL 154 08/03/2018   Lab Results  Component Value Date   HDL 63 08/03/2018   Lab Results  Component Value Date   LDLCALC 81 08/03/2018   Lab Results  Component Value Date   TRIG 48 08/03/2018   Lab Results  Component Value Date   CHOLHDL 2.4 08/03/2018   Lab Results  Component Value Date   HGBA1C 5.0 09/13/2016   Assessment & Plan:   1. Essential hypertension - CBC with Differential - Comprehensive metabolic panel - TSH  2. Paroxysmal atrial fibrillation (HCC) Stable. - apixaban (ELIQUIS) 5 MG TABS tablet; Take 1 tablet (5 mg total) by mouth 2 (two) times daily.  Dispense: 60 tablet; Refill: 3  3. Hyperlipidemia, unspecified hyperlipidemia type - atorvastatin (LIPITOR) 40 MG tablet; TAKE 1 TABLET BY MOUTH DAILY AT 6 PM.  Dispense: 30 tablet; Refill: 3 - Lipid Panel  4. Hypertension, unspecified type Antihypertensive medications are stable. He will continue Metoprolol and Amlodipine as prescribed. He will continue to decrease high sodium intake, excessive alcohol intake, increase potassium intake, smoking cessation, and increase physical activity of at least 30 minutes of cardio activity daily. He will continue to follow Heart Healthy or DASH diet. - amLODipine (NORVASC) 2.5 MG tablet; Take 1 tablet (2.5 mg total) by mouth daily.  Dispense: 30 tablet; Refill: 3  5. Elevated PSA Results are pending. - PSA  6. Healthcare maintenance  7. Vision changes Diagnosed with Cataracts. We will contact family member for assistance wi follow up with Optometry.   8. Elevated PSA, between 10 and less than 20 ng/ml Most recent PSA level at 11.9. we will re-evaluate today. We will contact family member for assistance in  follow up with Urology.   9.  Encounter for vitamin deficiency screening  10. Follow up He will follow up in 1 month. Contact sister to inform her about patient's condition and possible assistance with help getting patient to referrals.   Meds ordered this encounter  Medications  . metoprolol tartrate (LOPRESSOR) 25 MG tablet    Sig: Take 1 tablet (25 mg total) by mouth 2 (two) times daily.    Dispense:  60 tablet    Refill:  3  . lisinopril (PRINIVIL,ZESTRIL) 40 MG tablet    Sig: Take 1 tablet (40 mg total) by mouth daily.    Dispense:  30 tablet    Refill:  3    Patient to pick up as needed  . atorvastatin (LIPITOR) 40 MG tablet    Sig: TAKE 1 TABLET BY MOUTH DAILY AT 6 PM.    Dispense:  30 tablet    Refill:  3    Adding refills  . apixaban (ELIQUIS) 5 MG TABS tablet    Sig: Take 1 tablet (5 mg total) by mouth 2 (two) times daily.    Dispense:  60 tablet    Refill:  3    Patient will call when he needs this filled.  Marland Kitchen amLODipine (NORVASC) 2.5 MG tablet    Sig: Take 1 tablet (2.5 mg total) by mouth daily.    Dispense:  30 tablet    Refill:  3    Added additional refills  . spironolactone (ALDACTONE) 25 MG tablet    Sig: Take 1 tablet (25 mg total) by mouth daily.    Dispense:  30 tablet    Refill:  3    Orders Placed This Encounter  Procedures  . CBC with Differential  . Comprehensive metabolic panel  . Lipid Panel  . TSH  . PSA    Referral Orders  No referral(s) requested today    Kathe Becton,  MSN, FNP-C Patient [place], [place] [address] [phone]   Problem List Items Addressed This Visit      Cardiovascular and Mediastinum   Essential hypertension - Primary   Relevant Medications   metoprolol tartrate (LOPRESSOR) 25 MG tablet   lisinopril (PRINIVIL,ZESTRIL) 40 MG tablet   atorvastatin (LIPITOR) 40 MG tablet   apixaban (ELIQUIS) 5 MG TABS tablet   amLODipine (NORVASC) 2.5 MG tablet   spironolactone (ALDACTONE) 25  MG tablet   Other Relevant Orders   CBC with Differential (Completed)   Comprehensive metabolic panel (Completed)   TSH (Completed)   Paroxysmal atrial fibrillation (HCC)   Relevant Medications   metoprolol tartrate (LOPRESSOR) 25 MG tablet   lisinopril (PRINIVIL,ZESTRIL) 40 MG tablet   atorvastatin (LIPITOR) 40 MG tablet   apixaban (ELIQUIS) 5 MG TABS tablet   amLODipine (NORVASC) 2.5 MG tablet   spironolactone (ALDACTONE) 25 MG tablet    Other Visit Diagnoses    Hyperlipidemia, unspecified hyperlipidemia type       Relevant Medications   metoprolol tartrate (LOPRESSOR) 25 MG tablet   lisinopril (PRINIVIL,ZESTRIL) 40 MG tablet   atorvastatin (LIPITOR) 40 MG tablet   apixaban (ELIQUIS) 5 MG TABS tablet   amLODipine (NORVASC) 2.5 MG tablet   spironolactone (ALDACTONE) 25 MG tablet   Other Relevant Orders   Lipid Panel (Completed)   Hypertension, unspecified type       Relevant Medications   metoprolol tartrate (LOPRESSOR) 25 MG tablet   lisinopril (PRINIVIL,ZESTRIL) 40 MG tablet  atorvastatin (LIPITOR) 40 MG tablet   apixaban (ELIQUIS) 5 MG TABS tablet   amLODipine (NORVASC) 2.5 MG tablet   spironolactone (ALDACTONE) 25 MG tablet   Elevated PSA       Relevant Orders   PSA (Completed)   Healthcare maintenance       Vision changes       Elevated PSA, between 10 and less than 20 ng/ml       Encounter for vitamin deficiency screening       H/O vitamin D deficiency       H/O non anemic vitamin B12 deficiency       Follow up          Meds ordered this encounter  Medications  . metoprolol tartrate (LOPRESSOR) 25 MG tablet    Sig: Take 1 tablet (25 mg total) by mouth 2 (two) times daily.    Dispense:  60 tablet    Refill:  3  . lisinopril (PRINIVIL,ZESTRIL) 40 MG tablet    Sig: Take 1 tablet (40 mg total) by mouth daily.    Dispense:  30 tablet    Refill:  3    Patient to pick up as needed  . atorvastatin (LIPITOR) 40 MG tablet    Sig: TAKE 1 TABLET BY MOUTH DAILY AT  6 PM.    Dispense:  30 tablet    Refill:  3    Adding refills  . apixaban (ELIQUIS) 5 MG TABS tablet    Sig: Take 1 tablet (5 mg total) by mouth 2 (two) times daily.    Dispense:  60 tablet    Refill:  3    Patient will call when he needs this filled.  Marland Kitchen amLODipine (NORVASC) 2.5 MG tablet    Sig: Take 1 tablet (2.5 mg total) by mouth daily.    Dispense:  30 tablet    Refill:  3    Added additional refills  . spironolactone (ALDACTONE) 25 MG tablet    Sig: Take 1 tablet (25 mg total) by mouth daily.    Dispense:  30 tablet    Refill:  3    Follow-up: Return in about 1 month (around 09/03/2018).    Azzie Glatter, FNP

## 2018-08-04 ENCOUNTER — Telehealth: Payer: Self-pay | Admitting: Family Medicine

## 2018-08-04 LAB — CBC WITH DIFFERENTIAL/PLATELET
Basophils Absolute: 0 10*3/uL (ref 0.0–0.2)
Basos: 0 %
EOS (ABSOLUTE): 0.2 10*3/uL (ref 0.0–0.4)
Eos: 4 %
Hematocrit: 29.3 % — ABNORMAL LOW (ref 37.5–51.0)
Hemoglobin: 8.3 g/dL — ABNORMAL LOW (ref 13.0–17.7)
Immature Grans (Abs): 0 10*3/uL (ref 0.0–0.1)
Immature Granulocytes: 0 %
Lymphocytes Absolute: 1.7 10*3/uL (ref 0.7–3.1)
Lymphs: 37 %
MCH: 20.5 pg — ABNORMAL LOW (ref 26.6–33.0)
MCHC: 28.3 g/dL — ABNORMAL LOW (ref 31.5–35.7)
MCV: 73 fL — ABNORMAL LOW (ref 79–97)
Monocytes Absolute: 0.4 10*3/uL (ref 0.1–0.9)
Monocytes: 9 %
Neutrophils Absolute: 2.3 10*3/uL (ref 1.4–7.0)
Neutrophils: 50 %
Platelets: 423 10*3/uL (ref 150–450)
RBC: 4.04 x10E6/uL — ABNORMAL LOW (ref 4.14–5.80)
RDW: 19.6 % — ABNORMAL HIGH (ref 11.6–15.4)
WBC: 4.5 10*3/uL (ref 3.4–10.8)

## 2018-08-04 LAB — COMPREHENSIVE METABOLIC PANEL
ALT: 10 IU/L (ref 0–44)
AST: 24 IU/L (ref 0–40)
Albumin/Globulin Ratio: 1.2 (ref 1.2–2.2)
Albumin: 4.2 g/dL (ref 3.8–4.8)
Alkaline Phosphatase: 81 IU/L (ref 39–117)
BUN/Creatinine Ratio: 12 (ref 10–24)
BUN: 12 mg/dL (ref 8–27)
Bilirubin Total: 0.6 mg/dL (ref 0.0–1.2)
CO2: 22 mmol/L (ref 20–29)
Calcium: 9.7 mg/dL (ref 8.6–10.2)
Chloride: 101 mmol/L (ref 96–106)
Creatinine, Ser: 0.99 mg/dL (ref 0.76–1.27)
GFR calc Af Amer: 92 mL/min/{1.73_m2} (ref >59)
GFR calc non Af Amer: 80 mL/min/{1.73_m2} (ref >59)
Globulin, Total: 3.6 g/dL (ref 1.5–4.5)
Glucose: 90 mg/dL (ref 65–99)
Potassium: 3.7 mmol/L (ref 3.5–5.2)
Sodium: 145 mmol/L — ABNORMAL HIGH (ref 134–144)
Total Protein: 7.8 g/dL (ref 6.0–8.5)

## 2018-08-04 LAB — TSH: TSH: 0.444 u[IU]/mL — ABNORMAL LOW (ref 0.450–4.500)

## 2018-08-04 LAB — PSA: Prostate Specific Ag, Serum: 13.1 ng/mL — ABNORMAL HIGH (ref 0.0–4.0)

## 2018-08-04 LAB — LIPID PANEL
Chol/HDL Ratio: 2.4 ratio (ref 0.0–5.0)
Cholesterol, Total: 154 mg/dL (ref 100–199)
HDL: 63 mg/dL (ref >39)
LDL Calculated: 81 mg/dL (ref 0–99)
Triglycerides: 48 mg/dL (ref 0–149)
VLDL Cholesterol Cal: 10 mg/dL (ref 5–40)

## 2018-08-04 NOTE — Telephone Encounter (Signed)
Message left on voicemail for patient to return call concerning lab results.

## 2018-08-05 ENCOUNTER — Other Ambulatory Visit: Payer: Self-pay | Admitting: Family Medicine

## 2018-08-05 DIAGNOSIS — R972 Elevated prostate specific antigen [PSA]: Secondary | ICD-10-CM

## 2018-08-05 DIAGNOSIS — Z1321 Encounter for screening for nutritional disorder: Secondary | ICD-10-CM | POA: Insufficient documentation

## 2018-08-05 DIAGNOSIS — H259 Unspecified age-related cataract: Secondary | ICD-10-CM

## 2018-08-05 DIAGNOSIS — E785 Hyperlipidemia, unspecified: Secondary | ICD-10-CM | POA: Insufficient documentation

## 2018-08-05 DIAGNOSIS — H539 Unspecified visual disturbance: Secondary | ICD-10-CM | POA: Insufficient documentation

## 2018-08-05 NOTE — Progress Notes (Signed)
Carelink Summary Report / Loop Recorder 

## 2018-08-09 ENCOUNTER — Other Ambulatory Visit: Payer: Self-pay | Admitting: Family Medicine

## 2018-08-09 DIAGNOSIS — D649 Anemia, unspecified: Secondary | ICD-10-CM

## 2018-08-09 MED ORDER — FERROUS SULFATE 325 (65 FE) MG PO TABS: 325.0000 mg | ORAL_TABLET | Freq: Every day | ORAL | 3 refills | Status: DC

## 2018-08-17 ENCOUNTER — Telehealth: Payer: Self-pay | Admitting: *Deleted

## 2018-08-17 NOTE — Telephone Encounter (Signed)
Informed patient's sister of blood test results. Sister request we call the patient and inform him.

## 2018-08-19 NOTE — Telephone Encounter (Signed)
Informed patient of blood test results.

## 2018-08-20 ENCOUNTER — Telehealth: Payer: Self-pay

## 2018-08-20 NOTE — Telephone Encounter (Signed)
cay 

## 2018-08-20 NOTE — Telephone Encounter (Signed)
Left a vm for patient to callback 

## 2018-08-20 NOTE — Telephone Encounter (Signed)
Patient sister notified of results

## 2018-08-27 ENCOUNTER — Other Ambulatory Visit: Payer: Self-pay | Admitting: Family Medicine

## 2018-08-27 DIAGNOSIS — I10 Essential (primary) hypertension: Secondary | ICD-10-CM

## 2018-08-31 ENCOUNTER — Other Ambulatory Visit: Payer: Self-pay

## 2018-08-31 ENCOUNTER — Ambulatory Visit (INDEPENDENT_AMBULATORY_CARE_PROVIDER_SITE_OTHER): Payer: Medicare Other | Admitting: *Deleted

## 2018-08-31 DIAGNOSIS — I639 Cerebral infarction, unspecified: Secondary | ICD-10-CM | POA: Diagnosis not present

## 2018-08-31 LAB — CUP PACEART REMOTE DEVICE CHECK
Date Time Interrogation Session: 20200406164113
Implantable Pulse Generator Implant Date: 20170623

## 2018-09-04 ENCOUNTER — Ambulatory Visit: Payer: Medicare Other | Admitting: Family Medicine

## 2018-09-08 NOTE — Progress Notes (Signed)
Carelink Summary Report / Loop Recorder 

## 2018-09-09 MED FILL — SPIRONOLACTONE 25 MG TABLET: 25 | 30 days supply | Qty: 30 | Fill #7

## 2018-09-09 MED FILL — !ELIQUIS 5 MG TABLET: 5 | 30 days supply | Qty: 60 | Fill #1

## 2018-09-09 MED FILL — ?ATORVASTATIN 40MG TABLET: 40 | 90 days supply | Qty: 90 | Fill #1

## 2018-09-09 MED FILL — AMLODIPINE 2.5 MG TABLET: 2.5 | 30 days supply | Qty: 30 | Fill #0

## 2018-09-09 MED FILL — ?METOPROLOL 25 MG TABLET: 25 | 90 days supply | Qty: 180 | Fill #1

## 2018-09-09 MED FILL — LISINOPRIL 40 MG TABLET: 40 | 30 days supply | Qty: 30 | Fill #1

## 2018-09-14 ENCOUNTER — Other Ambulatory Visit: Payer: Self-pay

## 2018-09-14 ENCOUNTER — Encounter: Payer: Self-pay | Admitting: Family Medicine

## 2018-09-14 ENCOUNTER — Ambulatory Visit (INDEPENDENT_AMBULATORY_CARE_PROVIDER_SITE_OTHER): Payer: Medicare Other | Admitting: Family Medicine

## 2018-09-14 VITALS — BP 136/82 | HR 60 | Temp 97.9°F | Ht 71.0 in | Wt 157.0 lb

## 2018-09-14 DIAGNOSIS — H259 Unspecified age-related cataract: Secondary | ICD-10-CM | POA: Diagnosis not present

## 2018-09-14 DIAGNOSIS — R972 Elevated prostate specific antigen [PSA]: Secondary | ICD-10-CM

## 2018-09-14 DIAGNOSIS — Z09 Encounter for follow-up examination after completed treatment for conditions other than malignant neoplasm: Secondary | ICD-10-CM

## 2018-09-14 DIAGNOSIS — I10 Essential (primary) hypertension: Secondary | ICD-10-CM

## 2018-09-14 DIAGNOSIS — D649 Anemia, unspecified: Secondary | ICD-10-CM

## 2018-09-14 DIAGNOSIS — H539 Unspecified visual disturbance: Secondary | ICD-10-CM

## 2018-09-14 LAB — POCT URINALYSIS DIP (MANUAL ENTRY)
Bilirubin, UA: NEGATIVE
Blood, UA: NEGATIVE
Glucose, UA: NEGATIVE mg/dL
Ketones, POC UA: NEGATIVE mg/dL
Leukocytes, UA: NEGATIVE
Nitrite, UA: NEGATIVE
Spec Grav, UA: 1.02 (ref 1.010–1.025)
Urobilinogen, UA: 1 E.U./dL
pH, UA: 7 (ref 5.0–8.0)

## 2018-09-14 NOTE — Patient Instructions (Signed)
Appointment with Urology on 10/12/2018.     Prostate-Specific Antigen Test Why am I having this test? The prostate-specific antigen (PSA) test is a screening test for prostate cancer. It can identify early signs of prostate cancer, which may allow for more effective treatment. Your health care provider may recommend that you have a PSA test starting at age 66 or that you have one earlier or later, depending on your risk factors for prostate cancer. You may also have a PSA test:  To monitor treatment of prostate cancer.  To check whether prostate cancer has returned after treatment.  If you have signs of other conditions that can affect PSA levels, such as: ? An enlarged prostate that is not caused by cancer (benign prostatic hyperplasia, BPH). This condition is very common in older men. ? A prostate infection. What is being tested? This test measures the amount of PSA in your blood. PSA is a protein that is made in the prostate. The prostate naturally produces more PSA as you age, but very high levels may be a sign of a medical condition. What kind of sample is taken?  A blood sample is required for this test. It is usually collected by inserting a needle into a blood vessel or by sticking a finger with a small needle. Blood for this test should be drawn before having an exam of the prostate. How do I prepare for this test? Do not ejaculate starting 24 hours before your test, or as long as told by your health care provider. Tell a health care provider about:  Any allergies you have.  All medicines you are taking, including vitamins, herbs, eye drops, creams, and over-the-counter medicines. This also includes: ? Medicines to assist with hair growth, such as finasteride. ? Any recent exposure to a medicine called diethylstilbestrol.  Any blood disorders you have.  Any recent procedures you have had, especially any procedures involving the prostate or rectum.  Any medical conditions  you have.  Any recent urinary tract infections (UTIs) you have had. How are the results reported? Your test results will be reported as a value that indicates how much PSA is in your blood. This will be given as nanograms of PSA per milliliter of blood (ng/mL). Your health care provider will compare your results to normal ranges that were established after testing a large group of people (reference ranges). Reference ranges may vary among labs and hospitals. PSA levels vary from person to person and generally increase with age. Because of this variation, there is no single PSA value that is considered normal for everyone. Instead, PSA reference ranges are used to describe whether your PSA levels are considered low or high (elevated). Common reference ranges are:  Low: 0-2.5 ng/mL.  Slightly to moderately elevated: 2.6-10.0 ng/mL.  Moderately elevated: 10.0-19.9 ng/mL.  Significantly elevated: 20 ng/mL or greater. Sometimes, the test results may report that a condition is present when it is not present (false-positive result). What do the results mean? A test result that is higher than 4 ng/mL may mean that you are at an increased risk for prostate cancer. However, a PSA test by itself is not enough to diagnose prostate cancer. High PSA levels may also be caused by the natural aging process, prostate infection, or BPH. PSA screening cannot tell you if your PSA is high due to cancer or a different cause. A prostate biopsy is the only way to diagnose prostate cancer. A risk of having the PSA test is diagnosing and  treating prostate cancer that would never have caused any symptoms or problems (overdiagnosis and overtreatment). Talk with your health care provider about what your results mean. Questions to ask your health care provider Ask your health care provider, or the department that is doing the test:  When will my results be ready?  How will I get my results?  What are my treatment  options?  What other tests do I need?  What are my next steps? Summary  The prostate-specific antigen (PSA) test is a screening test for prostate cancer.  Your health care provider may recommend that you have a PSA test starting at age 95 or that you have one earlier or later, depending on your risk factors for prostate cancer.  A test result that is higher than 4 ng/mL may mean that you are at an increased risk for prostate cancer. However, elevated levels can be caused by a number of conditions other than prostate cancer.  Talk with your health care provider about what your results mean. This information is not intended to replace advice given to you by your health care provider. Make sure you discuss any questions you have with your health care provider. Document Released: 06/15/2004 Document Revised: 02/17/2017 Document Reviewed: 02/17/2017 Elsevier Interactive Patient Education  2019 [employer].

## 2018-09-14 NOTE — Progress Notes (Signed)
Patient [facility]    Established Patient Office Visit  Subjective:  Patient ID: Gregory Perry, male    DOB: 05-May-1951  Age: 68 y.o. MRN: [id number]  CC:  Chief Complaint  Patient presents with  . Follow-up    chronic condition     HPI Gregory Perry is a 68 year old who presents for follow up today.   Past Medical History:  Diagnosis Date  . Alcohol abuse   . Candida esophagitis ([place])   . Cardiomyopathy ([place])   . CVA (cerebral infarction)   . Duodenal ulcer   . Dysphagia   . Headache(784.0)    history of  . Hypertension   . Severe protein-calorie malnutrition ([place])   . Stroke [facility])     Current Status: Since his last office visit, he is doing well with no complaints.  He denies visual changes, chest pain, cough, shortness of breath, heart palpitations, and falls. He has occasional headaches and dizziness with position changes. Denies severe headaches, confusion, seizures, double vision, and nausea and vomiting. He is currently smoking 8-10 cigarettes daily.  He has a follow up appointment with Urology on Oct 12, 2018. He denies urinary problems at this time, like urinary frequency, discharge, dysuria, urinary itching, burning, odor, hematuria, and suprapubic pain/discomfort. He states that his appetite has decreased and he is only eating 1 meal a day. He states that his vision is cloudy and he only drives during the day. He has a follow up with Optometry for Cataracts. His sister continues to assist him with his healthcare issues.   He denies fevers, chills, fatigue, recent infections, weight loss, and night sweats. No reports of GI problems such as nausea, vomiting, diarrhea, and constipation. He has no reports of blood in stools, dysuria and hematuria. No depression or anxiety reported. He denies pain today.   Past Surgical History:  Procedure Laterality Date  . BALLOON DILATION N/A 11/22/2015   Procedure: BALLOON DILATION;   Surgeon: Mauri Pole, MD;  Location: [facility];  Service: Endoscopy;  Laterality: N/A;  . CARDIAC CATHETERIZATION N/A 01/17/2016   Procedure: Left Heart Cath and Coronary Angiography;  Surgeon: Sherren Mocha, MD;  Location: [place] [facility];  Service: Cardiovascular;  Laterality: N/A;  . DIRECT LARYNGOSCOPY  05/07/2011   Procedure: DIRECT LARYNGOSCOPY;  Surgeon: Rozetta Nunnery, MD;  Location: [facility];  Service: ENT;  Laterality: N/A;  ESOPHAGOSCOPY  WITH DILATION  . EP IMPLANTABLE DEVICE N/A 11/17/2015   Procedure: Loop Recorder Insertion;  Surgeon: Evans Lance, MD;  Location: [place] [facility];  Service: Cardiovascular;  Laterality: N/A;  . ESOPHAGOGASTRODUODENOSCOPY  04/29/2011   Procedure: ESOPHAGOGASTRODUODENOSCOPY (EGD);  Surgeon: Winfield Cunas., MD;  Location: [facility];  Service: Endoscopy;  Laterality: N/A;  . ESOPHAGOGASTRODUODENOSCOPY N/A 11/22/2015   Procedure: ESOPHAGOGASTRODUODENOSCOPY (EGD);  Surgeon: Mauri Pole, MD;  Location[facility] ENDOSCOPY;  Service: Endoscopy;  Laterality: N/A;  . LOOP RECORDER IMPLANT      Family History  Problem Relation Age of Onset  . Heart attack Maternal Grandfather     Social History   Socioeconomic History  . Marital status: Widowed    Spouse name: Not on file  . Number of children: Not on file  . Years of education: Not on file  . Highest education level: Not on file  Occupational History  . Not on file  Social Needs  . Financial resource strain: Not on file  . Food  insecurity:    Worry: Not on file    Inability: Not on file  . Transportation needs:    Medical: Not on file    Non-medical: Not on file  Tobacco Use  . Smoking status: Current Every Day Smoker    Packs/day: 0.50    Years: 30.00    Pack years: 15.00    Types: Cigarettes  . Smokeless tobacco: Never Used  . Tobacco comment: Pt smokes 6 to 7 a day trying to quit  Substance and Sexual Activity  . Alcohol use: No    Alcohol/week: 3.0  standard drinks    Types: 3 Shots of liquor per week    Comment: quit in 10/2015   . Drug use: Yes    Types: Marijuana    Comment: occ  . Sexual activity: Not on file  Lifestyle  . Physical activity:    Days per week: Not on file    Minutes per session: Not on file  . Stress: Not on file  Relationships  . Social connections:    Talks on phone: Not on file    Gets together: Not on file    Attends religious service: Not on file    Active member of club or organization: Not on file    Attends meetings of clubs or organizations: Not on file    Relationship status: Not on file  . Intimate partner violence:    Fear of current or ex partner: Not on file    Emotionally abused: Not on file    Physically abused: Not on file    Forced sexual activity: Not on file  Other Topics Concern  . Not on file  Social History Narrative  . Not on file    Outpatient Medications Prior to Visit  Medication Sig Dispense Refill  . amLODipine (NORVASC) 2.5 MG tablet TAKE 1 TABLET (2.5 MG TOTAL) BY MOUTH DAILY. 90 tablet 2  . apixaban (ELIQUIS) 5 MG TABS tablet Take 1 tablet (5 mg total) by mouth 2 (two) times daily. 60 tablet 3  . atorvastatin (LIPITOR) 40 MG tablet TAKE 1 TABLET BY MOUTH DAILY AT 6 PM. 30 tablet 3  . Blood Pressure Monitoring (BLOOD PRESSURE CUFF) MISC Check blood pressure daily between 8 pm -9 pm 1 each 0  . ferrous sulfate 325 (65 FE) MG tablet Take 1 tablet (325 mg total) by mouth daily. 30 tablet 3  . lisinopril (PRINIVIL,ZESTRIL) 40 MG tablet Take 1 tablet (40 mg total) by mouth daily. 30 tablet 3  . metoprolol tartrate (LOPRESSOR) 25 MG tablet Take 1 tablet (25 mg total) by mouth 2 (two) times daily. 60 tablet 3  . pantoprazole (PROTONIX) 40 MG tablet TAKE 1 TABLET BY MOUTH 2 TIMES DAILY BEFORE A MEAL. 90 tablet 2  . potassium chloride SA (K-DUR,KLOR-CON) 20 MEQ tablet Take 2 tablets (40 mEq total) by mouth daily. 180 tablet 3  . spironolactone (ALDACTONE) 25 MG tablet Take 1  tablet (25 mg total) by mouth daily. 30 tablet 3   No facility-administered medications prior to visit.     No Known Allergies  ROS Review of Systems  Constitutional: Positive for appetite change (decreased over past 6 months) and fatigue (Increased).  HENT: Negative.   Eyes: Positive for visual disturbance (Bilataral Cataracts).  Respiratory: Negative.   Cardiovascular: Negative.   Gastrointestinal: Negative.   Endocrine: Negative.   Genitourinary: Negative.   Musculoskeletal: Positive for arthralgias (generalized pain).  Skin: Negative.   Allergic/Immunologic: Negative.   Neurological:  Positive for dizziness and headaches.  Hematological: Negative.   Psychiatric/Behavioral: Negative.    Objective:    Physical Exam  Constitutional: He appears well-developed and well-nourished.  HENT:  Head: Normocephalic and atraumatic.  Eyes:  Bilateral Cataracts  Cloudy Vision  Neck: Normal range of motion. Neck supple.  Cardiovascular: Normal rate, regular rhythm, normal heart sounds and intact distal pulses.  Pulmonary/Chest: Effort normal and breath sounds normal.  Abdominal: Soft. Bowel sounds are normal.  Musculoskeletal: Normal range of motion.  Skin: Skin is warm and dry.  Psychiatric: He has a normal mood and affect. His behavior is normal. Judgment and thought content normal.    BP 136/82 (BP Location: Left Arm, Patient Position: Sitting, Cuff Size: Small)   Pulse 60   Temp 97.9 F (36.6 C) (Oral)   Ht 5\' 11"  (1.803 m)   Wt 157 lb (71.2 kg)   SpO2 100%   BMI 21.90 kg/m  Wt Readings from Last 3 Encounters:  09/14/18 157 lb (71.2 kg)  08/03/18 154 lb (69.9 kg)  04/22/18 157 lb (71.2 kg)     Health Maintenance Due  Topic Date Due  . TETANUS/TDAP  09/27/1971  . COLONOSCOPY  09/27/2002  . PNA vac Low Risk Adult (1 of 2 - PCV13) 09/26/2017    There are no preventive care reminders to display for this patient.  Lab Results  Component Value Date   TSH 0.444 (L)  08/03/2018   Lab Results  Component Value Date   WBC 4.5 08/03/2018   HGB 8.3 (L) 08/03/2018   HCT 29.3 (L) 08/03/2018   MCV 73 (L) 08/03/2018   PLT 423 08/03/2018   Lab Results  Component Value Date   NA 145 (H) 08/03/2018   K 3.7 08/03/2018   CO2 22 08/03/2018   GLUCOSE 90 08/03/2018   BUN 12 08/03/2018   CREATININE 0.99 08/03/2018   BILITOT 0.6 08/03/2018   ALKPHOS 81 08/03/2018   AST 24 08/03/2018   ALT 10 08/03/2018   PROT 7.8 08/03/2018   ALBUMIN 4.2 08/03/2018   CALCIUM 9.7 08/03/2018   ANIONGAP 5 01/17/2016   Lab Results  Component Value Date   CHOL 154 08/03/2018   Lab Results  Component Value Date   HDL 63 08/03/2018   Lab Results  Component Value Date   LDLCALC 81 08/03/2018   Lab Results  Component Value Date   TRIG 48 08/03/2018   Lab Results  Component Value Date   CHOLHDL 2.4 08/03/2018   Lab Results  Component Value Date   HGBA1C 5.0 09/13/2016   Assessment & Plan:   1. Essential hypertension Blood pressure is stable at 136/82 today. Continue Amlodipine and Lisinopril as prescribed. He will continue to decrease high sodium intake, excessive alcohol intake, increase potassium intake, smoking cessation, and increase physical activity of at least 30 minutes of cardio activity daily. He will continue to follow Heart Healthy or DASH diet. - POCT urinalysis dipstick  2. Elevated PSA  3. Elevated PSA, between 10 and less than 20 ng/ml He will keep appointment with Urology on 10/12/2018.   4. Age-related cataract of both eyes, unspecified age-related cataract type  5. Vision changes Cloudy vision. Bilateral Cataracts. He will follow up with Optometry.   6. Anemia, unspecified type Hgb is decreased at 8.3 today. We will continue Ferrous Sulfate as prescribed.    7. Follow up He will follow up in 1 month.   No orders of the defined types were placed in this encounter.  Orders Placed This Encounter  Procedures  . POCT urinalysis  dipstick    Referral Orders  No referral(s) requested today    Kathe Becton,  MSN, FNP-C Patient [facility] [place], [place] [address] [phone]   Problem List Items Addressed This Visit      Cardiovascular and Mediastinum   Essential hypertension - Primary   Relevant Orders   POCT urinalysis dipstick (Completed)     Other   Elevated PSA   Vision changes    Other Visit Diagnoses    Elevated PSA, between 10 and less than 20 ng/ml       Age-related cataract of both eyes, unspecified age-related cataract type       Anemia, unspecified type       Follow up          No orders of the defined types were placed in this encounter.   Follow-up: Return in about 1 month (around 10/14/2018).    Azzie Glatter, FNP

## 2018-09-17 DIAGNOSIS — H259 Unspecified age-related cataract: Secondary | ICD-10-CM | POA: Insufficient documentation

## 2018-09-24 DIAGNOSIS — H2512 Age-related nuclear cataract, left eye: Secondary | ICD-10-CM | POA: Diagnosis not present

## 2018-09-24 DIAGNOSIS — H401234 Low-tension glaucoma, bilateral, indeterminate stage: Secondary | ICD-10-CM | POA: Diagnosis not present

## 2018-09-24 DIAGNOSIS — H35033 Hypertensive retinopathy, bilateral: Secondary | ICD-10-CM | POA: Diagnosis not present

## 2018-09-24 DIAGNOSIS — H2513 Age-related nuclear cataract, bilateral: Secondary | ICD-10-CM | POA: Diagnosis not present

## 2018-09-24 DIAGNOSIS — H25013 Cortical age-related cataract, bilateral: Secondary | ICD-10-CM | POA: Diagnosis not present

## 2018-10-01 ENCOUNTER — Telehealth: Payer: Self-pay

## 2018-10-01 DIAGNOSIS — H401222 Low-tension glaucoma, left eye, moderate stage: Secondary | ICD-10-CM | POA: Diagnosis not present

## 2018-10-01 DIAGNOSIS — H401213 Low-tension glaucoma, right eye, severe stage: Secondary | ICD-10-CM | POA: Diagnosis not present

## 2018-10-01 DIAGNOSIS — H53461 Homonymous bilateral field defects, right side: Secondary | ICD-10-CM | POA: Diagnosis not present

## 2018-10-01 NOTE — Telephone Encounter (Signed)
-----   Message from Azzie Glatter, [place] sent at 09/30/2018  4:49 PM EDT ----- Regarding: "Appointment with Urology" Gregory Perry,   Please contact Urology to confirm that patient's appointment is for 10/12/2018. Afterwards, please contact patient's sister [name], who he lives with), to give her this date and time. Important that he does not miss this appointment. Thanks so much!

## 2018-10-01 NOTE — Telephone Encounter (Signed)
Called [facility] and verified appointment for 5/18 at 230pm. Spoke with [name] the sister and gave her information.

## 2018-10-02 MED FILL — LATANOPROST 0.005% EYE DRP: 0.005 | 25 days supply | Qty: 3 | Fill #0

## 2018-10-05 ENCOUNTER — Other Ambulatory Visit: Payer: Self-pay

## 2018-10-05 ENCOUNTER — Telehealth: Payer: Self-pay

## 2018-10-05 ENCOUNTER — Ambulatory Visit (INDEPENDENT_AMBULATORY_CARE_PROVIDER_SITE_OTHER): Payer: Medicare Other | Admitting: *Deleted

## 2018-10-05 DIAGNOSIS — I639 Cerebral infarction, unspecified: Secondary | ICD-10-CM

## 2018-10-05 LAB — CUP PACEART REMOTE DEVICE CHECK
Date Time Interrogation Session: 20200509174041
Implantable Pulse Generator Implant Date: 20170623

## 2018-10-05 NOTE — Telephone Encounter (Signed)
Patient had eye exam at [facility] on 09/24/2018.

## 2018-10-08 ENCOUNTER — Telehealth: Payer: Self-pay | Admitting: *Deleted

## 2018-10-08 NOTE — Telephone Encounter (Signed)
LM at both home and cell number requesting call back to DC. Gave direct DC number for return call.  Received triage alert for pause episode on [facility] from 10/07/18 at 06:14, duration 6sec.

## 2018-10-12 NOTE — Telephone Encounter (Signed)
LMOVM at home number requesting call back to DC.

## 2018-10-12 NOTE — Telephone Encounter (Signed)
Patient returned call. He denies symptoms with episode. Reports he was likely asleep at the time. No dizziness or syncopal episodes. Advised pt to contact our office if he experiences these symptoms. He verbalizes understanding and denies questions or concerns at this time.

## 2018-10-14 ENCOUNTER — Other Ambulatory Visit: Payer: Self-pay

## 2018-10-14 ENCOUNTER — Ambulatory Visit (INDEPENDENT_AMBULATORY_CARE_PROVIDER_SITE_OTHER): Payer: Medicare Other | Admitting: Family Medicine

## 2018-10-14 ENCOUNTER — Encounter: Payer: Self-pay | Admitting: Family Medicine

## 2018-10-14 VITALS — BP 140/80 | HR 60 | Temp 97.8°F | Ht 71.0 in | Wt 158.0 lb

## 2018-10-14 DIAGNOSIS — E785 Hyperlipidemia, unspecified: Secondary | ICD-10-CM | POA: Diagnosis not present

## 2018-10-14 DIAGNOSIS — R634 Abnormal weight loss: Secondary | ICD-10-CM | POA: Diagnosis not present

## 2018-10-14 DIAGNOSIS — H259 Unspecified age-related cataract: Secondary | ICD-10-CM

## 2018-10-14 DIAGNOSIS — M2550 Pain in unspecified joint: Secondary | ICD-10-CM | POA: Diagnosis not present

## 2018-10-14 DIAGNOSIS — R630 Anorexia: Secondary | ICD-10-CM | POA: Diagnosis not present

## 2018-10-14 DIAGNOSIS — R972 Elevated prostate specific antigen [PSA]: Secondary | ICD-10-CM | POA: Diagnosis not present

## 2018-10-14 DIAGNOSIS — I10 Essential (primary) hypertension: Secondary | ICD-10-CM | POA: Diagnosis not present

## 2018-10-14 DIAGNOSIS — Z09 Encounter for follow-up examination after completed treatment for conditions other than malignant neoplasm: Secondary | ICD-10-CM | POA: Diagnosis not present

## 2018-10-14 DIAGNOSIS — H539 Unspecified visual disturbance: Secondary | ICD-10-CM

## 2018-10-14 LAB — POCT URINALYSIS DIP (MANUAL ENTRY)
Bilirubin, UA: NEGATIVE
Blood, UA: NEGATIVE
Glucose, UA: NEGATIVE mg/dL
Ketones, POC UA: NEGATIVE mg/dL
Leukocytes, UA: NEGATIVE
Nitrite, UA: NEGATIVE
Protein Ur, POC: NEGATIVE mg/dL
Spec Grav, UA: 1.02 (ref 1.010–1.025)
Urobilinogen, UA: 1 E.U./dL
pH, UA: 7 (ref 5.0–8.0)

## 2018-10-14 NOTE — Progress Notes (Signed)
poc

## 2018-10-14 NOTE — Progress Notes (Signed)
Patient [facility]    Established Patient Office Visit  Subjective:  Patient ID: Gregory Perry, male    DOB: [birth year]  Age: 68 y.o. MRN: [id number]  CC:  Chief Complaint  Patient presents with  . Follow-up    1 month on chronic condition     HPI Gregory Perry is a 68 year old male who presents for Follow Up today.   Past Medical History:  Diagnosis Date  . Alcohol abuse   . Candida esophagitis ([place])   . Cardiomyopathy ([place])   . CVA (cerebral infarction)   . Duodenal ulcer   . Dysphagia   . Headache(784.0)    history of  . Hypertension   . Severe protein-calorie malnutrition ([place])   . Stroke [facility])    Current Status: Since his last office visit, he states that he is doing well with no complaints. He denies visual changes, chest pain, cough, shortness of breath, heart palpitations, and falls. He has occasional headaches and dizziness with position changes. Denies severe headaches, confusion, seizures, double vision, and blurred vision, nausea and vomiting. He continues to smoke, with 8-9 cigarettes daily. He missed his appointment with Urology on Mary 18, 2020 for follow up of increasing PSA. He has an appointment for Cataract surgery consult on Oct 21, 2018. He states that his appetite continues to decrease, as he rarely eats, and he states that he may eat food every other day. His energy level continues to decrease. He did receive Boost at his last office visit. He reports generalized arthritic pain.   He denies fevers, chills, recent infections, weight loss, and night sweats. No reports of GI problems such as nausea, vomiting, diarrhea, and constipation. He has no reports of blood in stools, dysuria and hematuria. No depression or anxiety reported.    Past Surgical History:  Procedure Laterality Date  . BALLOON DILATION N/A 11/22/2015   Procedure: BALLOON DILATION;  Surgeon: Mauri Pole, MD;  Location: [facility];   Service: Endoscopy;  Laterality: N/A;  . CARDIAC CATHETERIZATION N/A 01/17/2016   Procedure: Left Heart Cath and Coronary Angiography;  Surgeon: Sherren Mocha, MD;  Location: [facility];  Service: Cardiovascular;  Laterality: N/A;  . DIRECT LARYNGOSCOPY  05/07/2011   Procedure: DIRECT LARYNGOSCOPY;  Surgeon: Rozetta Nunnery, MD;  Location: [facility];  Service: ENT;  Laterality: N/A;  ESOPHAGOSCOPY  WITH DILATION  . EP IMPLANTABLE DEVICE N/A 11/17/2015   Procedure: Loop Recorder Insertion;  Surgeon: Evans Lance, MD;  Location: [place] [facility];  Service: Cardiovascular;  Laterality: N/A;  . ESOPHAGOGASTRODUODENOSCOPY  04/29/2011   Procedure: ESOPHAGOGASTRODUODENOSCOPY (EGD);  Surgeon: Winfield Cunas., MD;  Location: [facility];  Service: Endoscopy;  Laterality: N/A;  . ESOPHAGOGASTRODUODENOSCOPY N/A 11/22/2015   Procedure: ESOPHAGOGASTRODUODENOSCOPY (EGD);  Surgeon: Mauri Pole, MD;  Location: [facility];  Service: Endoscopy;  Laterality: N/A;  . LOOP RECORDER IMPLANT      Family History  Problem Relation Age of Onset  . Heart attack Maternal Grandfather     Social History   Socioeconomic History  . Marital status: Widowed    Spouse name: Not on file  . Number of children: Not on file  . Years of education: Not on file  . Highest education level: Not on file  Occupational History  . Not on file  Social Needs  . Financial resource strain: Not on file  . Food insecurity:    Worry: Not on  file    Inability: Not on file  . Transportation needs:    Medical: Not on file    Non-medical: Not on file  Tobacco Use  . Smoking status: Current Every Day Smoker    Packs/day: 0.50    Years: 30.00    Pack years: 15.00    Types: Cigarettes  . Smokeless tobacco: Never Used  . Tobacco comment: Pt smokes 6 to 7 a day trying to quit  Substance and Sexual Activity  . Alcohol use: No    Alcohol/week: 3.0 standard drinks    Types: 3 Shots of liquor per week     Comment: quit in 10/2015   . Drug use: Yes    Types: Marijuana    Comment: occ  . Sexual activity: Not on file  Lifestyle  . Physical activity:    Days per week: Not on file    Minutes per session: Not on file  . Stress: Not on file  Relationships  . Social connections:    Talks on phone: Not on file    Gets together: Not on file    Attends religious service: Not on file    Active member of club or organization: Not on file    Attends meetings of clubs or organizations: Not on file    Relationship status: Not on file  . Intimate partner violence:    Fear of current or ex partner: Not on file    Emotionally abused: Not on file    Physically abused: Not on file    Forced sexual activity: Not on file  Other Topics Concern  . Not on file  Social History Narrative  . Not on file    Outpatient Medications Prior to Visit  Medication Sig Dispense Refill  . amLODipine (NORVASC) 2.5 MG tablet TAKE 1 TABLET (2.5 MG TOTAL) BY MOUTH DAILY. 90 tablet 2  . apixaban (ELIQUIS) 5 MG TABS tablet Take 1 tablet (5 mg total) by mouth 2 (two) times daily. 60 tablet 3  . atorvastatin (LIPITOR) 40 MG tablet TAKE 1 TABLET BY MOUTH DAILY AT 6 PM. 30 tablet 3  . Blood Pressure Monitoring (BLOOD PRESSURE CUFF) MISC Check blood pressure daily between 8 pm -9 pm 1 each 0  . ferrous sulfate 325 (65 FE) MG tablet Take 1 tablet (325 mg total) by mouth daily. 30 tablet 3  . lisinopril (PRINIVIL,ZESTRIL) 40 MG tablet Take 1 tablet (40 mg total) by mouth daily. 30 tablet 3  . metoprolol tartrate (LOPRESSOR) 25 MG tablet Take 1 tablet (25 mg total) by mouth 2 (two) times daily. 60 tablet 3  . pantoprazole (PROTONIX) 40 MG tablet TAKE 1 TABLET BY MOUTH 2 TIMES DAILY BEFORE A MEAL. 90 tablet 2  . potassium chloride SA (K-DUR,KLOR-CON) 20 MEQ tablet Take 2 tablets (40 mEq total) by mouth daily. 180 tablet 3  . spironolactone (ALDACTONE) 25 MG tablet Take 1 tablet (25 mg total) by mouth daily. 30 tablet 3   No  facility-administered medications prior to visit.     No Known Allergies  ROS Review of Systems  Constitutional: Positive for appetite change (decreased).  HENT: Negative.   Eyes: Positive for visual disturbance.       Cataracts  Respiratory: Negative.   Cardiovascular: Negative.   Gastrointestinal: Negative.   Endocrine: Negative.   Genitourinary: Positive for difficulty urinating.  Musculoskeletal: Negative.   Skin: Negative.   Allergic/Immunologic: Negative.   Neurological: Positive for dizziness and headaches.  Hematological: Negative.  Psychiatric/Behavioral: Negative.    Objective:    Physical Exam  Constitutional: He is oriented to person, place, and time.  HENT:  Head: Normocephalic and atraumatic.  Eyes: Conjunctivae are normal.  Neck: Normal range of motion. Neck supple.  Cardiovascular: Normal rate, regular rhythm, normal heart sounds and intact distal pulses.  Pulmonary/Chest: Effort normal and breath sounds normal.  Abdominal: Soft. Bowel sounds are normal.  Musculoskeletal: Normal range of motion.  Neurological: He is alert and oriented to person, place, and time. He has normal reflexes.  Skin: Skin is warm and dry.  Psychiatric: He has a normal mood and affect. His behavior is normal. Judgment and thought content normal.  Nursing note and vitals reviewed.   BP 140/80 (BP Location: Left Arm, Patient Position: Sitting, Cuff Size: Small)   Pulse 60   Temp 97.8 F (36.6 C) (Oral)   Ht 5\' 11"  (1.803 m)   Wt 158 lb (71.7 kg)   SpO2 100%   BMI 22.04 kg/m  Wt Readings from Last 3 Encounters:  10/14/18 158 lb (71.7 kg)  09/14/18 157 lb (71.2 kg)  08/03/18 154 lb (69.9 kg)     Health Maintenance Due  Topic Date Due  . TETANUS/TDAP  09/27/1971  . COLONOSCOPY  09/27/2002  . PNA vac Low Risk Adult (1 of 2 - PCV13) 09/26/2017    There are no preventive care reminders to display for this patient.  Lab Results  Component Value Date   TSH 0.444 (L)  08/03/2018   Lab Results  Component Value Date   WBC 4.5 08/03/2018   HGB 8.3 (L) 08/03/2018   HCT 29.3 (L) 08/03/2018   MCV 73 (L) 08/03/2018   PLT 423 08/03/2018   Lab Results  Component Value Date   NA 145 (H) 08/03/2018   K 3.7 08/03/2018   CO2 22 08/03/2018   GLUCOSE 90 08/03/2018   BUN 12 08/03/2018   CREATININE 0.99 08/03/2018   BILITOT 0.6 08/03/2018   ALKPHOS 81 08/03/2018   AST 24 08/03/2018   ALT 10 08/03/2018   PROT 7.8 08/03/2018   ALBUMIN 4.2 08/03/2018   CALCIUM 9.7 08/03/2018   ANIONGAP 5 01/17/2016   Lab Results  Component Value Date   CHOL 154 08/03/2018   Lab Results  Component Value Date   HDL 63 08/03/2018   Lab Results  Component Value Date   LDLCALC 81 08/03/2018   Lab Results  Component Value Date   TRIG 48 08/03/2018   Lab Results  Component Value Date   CHOLHDL 2.4 08/03/2018   Lab Results  Component Value Date   HGBA1C 5.0 09/13/2016   Assessment & Plan:   1. Elevated PSA, between 10 and less than 20 ng/ml He will keep follow up appointment with Urology in June for further evaluation.  - PSA  2. Elevated PSA Previous level increased at 13.1 - PSA  3. Decreased appetite Reports that he currently eating one meal every other day. Encouraged patient to eat 6 small meals or 'snacks' a day. He will attempt to eat least 3 small meals 'snacks' a day. Manuela Schwartz, Social Worker from our office spoke with patient today and gave him Boost samples to aide with his nutrition. We will initiate Megace today.   4. Weight loss, unintentional Approximately 20 lb weight loss in 2 months.   5. Generalized joint and back pain  6. Vision changes Accelerated Cataracts.  7. Age-related cataract of both eyes, unspecified age-related cataract type He will keep appointment  for assessment of Cataract removal on 10/21/2018.  8. Essential hypertension The current medical regimen is effective; blood pressure is stable at 140/82 today; continue present  plan and medications as prescribed. He will continue to decrease high sodium intake, excessive alcohol intake, increase potassium intake, smoking cessation, and increase physical activity of at least 30 minutes of cardio activity daily. He will continue to follow Heart Healthy or DASH diet. - POCT urinalysis dipstick  9. Hyperlipidemia, unspecified hyperlipidemia type  10. Follow up He will follow up in 1 month.   Meds ordered this encounter  Medications  . megestrol (MEGACE) 40 MG tablet    Sig: Take 1 tablet (40 mg total) by mouth daily.    Dispense:  30 tablet    Refill:  1    Orders Placed This Encounter  Procedures  . PSA  . POCT urinalysis dipstick    Referral Orders  No referral(s) requested today    Kathe Becton,  MSN, FNP-C [address] [place], [place] [address] [phone]   Problem List Items Addressed This Visit      Cardiovascular and Mediastinum   Essential hypertension   Relevant Orders   POCT urinalysis dipstick (Completed)     Other   Age-related cataract of both eyes   Elevated PSA   Relevant Orders   PSA (Completed)   Hyperlipidemia   Vision changes    Other Visit Diagnoses    Elevated PSA, between 10 and less than 20 ng/ml    -  Primary   Relevant Orders   PSA (Completed)   Decreased appetite       Relevant Medications   megestrol (MEGACE) 40 MG tablet   Weight loss, unintentional       Relevant Medications   megestrol (MEGACE) 40 MG tablet   Generalized joint pain       Follow up          Meds ordered this encounter  Medications  . megestrol (MEGACE) 40 MG tablet    Sig: Take 1 tablet (40 mg total) by mouth daily.    Dispense:  30 tablet    Refill:  1    Follow-up: Return in about 1 month (around 11/14/2018).    Azzie Glatter, FNP

## 2018-10-15 ENCOUNTER — Telehealth: Payer: Self-pay

## 2018-10-15 DIAGNOSIS — R630 Anorexia: Secondary | ICD-10-CM | POA: Insufficient documentation

## 2018-10-15 DIAGNOSIS — R634 Abnormal weight loss: Secondary | ICD-10-CM | POA: Insufficient documentation

## 2018-10-15 DIAGNOSIS — M2550 Pain in unspecified joint: Secondary | ICD-10-CM | POA: Insufficient documentation

## 2018-10-15 LAB — PSA: Prostate Specific Ag, Serum: 10.4 ng/mL — ABNORMAL HIGH (ref 0.0–4.0)

## 2018-10-15 MED ORDER — MEGESTROL ACETATE 40 MG PO TABS: 40.0000 mg | ORAL_TABLET | Freq: Every day | ORAL | 1 refills | Status: DC

## 2018-10-15 MED FILL — MEGESTROL 40 MG TABLET: 40 | 30 days supply | Qty: 30 | Fill #0

## 2018-10-20 MED FILL — SPIRONOLACTONE 25 MG TABLET: 25 | 30 days supply | Qty: 30 | Fill #8

## 2018-10-20 MED FILL — LISINOPRIL 40 MG TABLET: 40 | 30 days supply | Qty: 30 | Fill #2

## 2018-10-20 MED FILL — AMLODIPINE 2.5 MG TABLET: 2.5 | 30 days supply | Qty: 30 | Fill #1

## 2018-10-20 MED FILL — ELIQUIS 5 MG TABLET: 5 | 30 days supply | Qty: 60 | Fill #2

## 2018-10-20 NOTE — Progress Notes (Signed)
Carelink Summary Report / Loop Recorder 

## 2018-10-20 NOTE — Telephone Encounter (Signed)
Labs and notes were faxed to Dr. Junious Silk.

## 2018-10-20 NOTE — Telephone Encounter (Signed)
Patient notified

## 2018-10-20 NOTE — Telephone Encounter (Signed)
-----   Message from Azzie Glatter, [place] sent at 10/14/2018  8:22 AM EDT ----- Regarding: "Labs" Please fax latest PSA lab results to [facility] @ [phone], Attention Dr. Junious Silk.

## 2018-10-20 NOTE — Telephone Encounter (Signed)
-----   Message from Azzie Glatter, [place] sent at 10/15/2018  8:42 AM EDT ----- Regarding: "New Medication" New Rx, Megace, to increase appetite sent to pharmacy today. Office note and labs (PSA) are complete and ready to fax to Urology. Please inform patient.

## 2018-10-21 DIAGNOSIS — H2512 Age-related nuclear cataract, left eye: Secondary | ICD-10-CM | POA: Diagnosis not present

## 2018-10-21 DIAGNOSIS — H401222 Low-tension glaucoma, left eye, moderate stage: Secondary | ICD-10-CM | POA: Diagnosis not present

## 2018-10-21 DIAGNOSIS — H268 Other specified cataract: Secondary | ICD-10-CM | POA: Diagnosis not present

## 2018-10-23 ENCOUNTER — Encounter (HOSPITAL_COMMUNITY): Payer: Self-pay

## 2018-10-28 ENCOUNTER — Encounter (HOSPITAL_COMMUNITY): Payer: Self-pay | Admitting: Urology

## 2018-11-02 DIAGNOSIS — R972 Elevated prostate specific antigen [PSA]: Secondary | ICD-10-CM | POA: Diagnosis not present

## 2018-11-02 MED FILL — levoFLOXacin 750 MG TABS: 750 | 1 days supply | Qty: 1 | Fill #0

## 2018-11-03 ENCOUNTER — Telehealth: Payer: Self-pay | Admitting: *Deleted

## 2018-11-03 NOTE — Telephone Encounter (Signed)
   [facility] Pre-operative Risk Assessment    Request for surgical clearance:  1. What type of surgery is being performed? PROSTATE BIOPSY  2. When is this surgery scheduled? PENDING CLEARANCE  3. What type of clearance is required (medical clearance vs. Pharmacy clearance to hold med vs. Both)? PHARMACY  4. Are there any medications that need to be held prior to surgery and how long? HOLD ELIQUIS 2 DAYS PRIOR TO PROCEDURE  5. Practice name and name of physician performing surgery? [facility], DR. MATT ESKRIDGE  6. What is your office phone number [phone]   7.   What is your office fax number [phone]  8.   Anesthesia type (None, local, MAC, general) ? PENDING RECOMMENDATION   Gregory Perry 11/03/2018, 6:41 PM  _________________________________________________________________   (provider comments below)

## 2018-11-04 NOTE — Telephone Encounter (Signed)
Patient with diagnosis of afib on Eliquis for anticoagulation.    Procedure: PROSTATE BIOPSY Date of procedure: TBD  CHADS2-VASc score of  6 (CHF, HTN, AGE, DM2, stroke/tia x 2, CAD, AGE, male)  CrCl 25ml.min  Holding Eliquis 2 days prior to procedure seems reasonable due to bleed risk of procedure. Patient however is at increased risk off anticoagulation due to history of stroke. I will route to MD for input.

## 2018-11-05 ENCOUNTER — Ambulatory Visit (INDEPENDENT_AMBULATORY_CARE_PROVIDER_SITE_OTHER): Payer: Medicare Other | Admitting: *Deleted

## 2018-11-05 DIAGNOSIS — I639 Cerebral infarction, unspecified: Secondary | ICD-10-CM

## 2018-11-06 LAB — CUP PACEART REMOTE DEVICE CHECK
Date Time Interrogation Session: 20200611181059
Implantable Pulse Generator Implant Date: 20170623

## 2018-11-06 NOTE — Telephone Encounter (Signed)
   Primary Cardiologist: Cristopher Peru, MD  Chart reviewed as part of pre-operative protocol coverage. Patient was contacted 11/06/2018 in reference to pre-operative risk assessment for pending surgery as outlined below.  Gregory Perry was last seen 03/2018 by Dr. Lovena Le - h/o ETOH, candida esophagitis, moderate CAD, NICM, CVA, ulcer, HTN, atrial fib by ILR, and 6 sec pause while sleeping. Last echo 2018 EF 30-35%. Last OV 03/2018 with plan to f/u 1 year. The patient denies any CP, SOB, palpitations, syncope. He is able to walk and mow grass without any adverse complications.   However, it appears we are waiting on input from Dr. Lovena Le on the question raised by pharmacist below. Would also like input from Dr. Lovena Le about 2) whether there was plan to consider repeating echo at some point (pt does not have ICD) and 3) input about 6 second pause from 09/2018 as to whether this would warrant any med changes.   Dr. Lovena Le - Please route response to P [facility] PREOP (the pre-op pool). Thank you.  Charlie Pitter, PA-C 11/06/2018, 9:07 AM

## 2018-11-11 NOTE — Telephone Encounter (Signed)
Per verbal order from Dr. Corliss Parish to hold Eliquis for 2 days prior to procedure.  Will route to preop pool.

## 2018-11-12 NOTE — Progress Notes (Signed)
Carelink Summary Report / Loop Recorder 

## 2018-11-13 ENCOUNTER — Telehealth: Payer: Self-pay

## 2018-11-13 NOTE — Telephone Encounter (Signed)
Patient is negative for screening and will be coming in the office

## 2018-11-16 ENCOUNTER — Encounter: Payer: Self-pay | Admitting: Family Medicine

## 2018-11-16 ENCOUNTER — Other Ambulatory Visit: Payer: Self-pay

## 2018-11-16 ENCOUNTER — Ambulatory Visit (INDEPENDENT_AMBULATORY_CARE_PROVIDER_SITE_OTHER): Payer: Medicare Other | Admitting: Family Medicine

## 2018-11-16 VITALS — BP 124/78 | HR 68 | Temp 97.9°F | Ht 71.0 in | Wt 151.8 lb

## 2018-11-16 DIAGNOSIS — R630 Anorexia: Secondary | ICD-10-CM

## 2018-11-16 DIAGNOSIS — Z09 Encounter for follow-up examination after completed treatment for conditions other than malignant neoplasm: Secondary | ICD-10-CM

## 2018-11-16 DIAGNOSIS — Z23 Encounter for immunization: Secondary | ICD-10-CM

## 2018-11-16 DIAGNOSIS — R634 Abnormal weight loss: Secondary | ICD-10-CM

## 2018-11-16 DIAGNOSIS — H539 Unspecified visual disturbance: Secondary | ICD-10-CM

## 2018-11-16 DIAGNOSIS — R972 Elevated prostate specific antigen [PSA]: Secondary | ICD-10-CM | POA: Diagnosis not present

## 2018-11-16 DIAGNOSIS — H259 Unspecified age-related cataract: Secondary | ICD-10-CM

## 2018-11-16 DIAGNOSIS — I10 Essential (primary) hypertension: Secondary | ICD-10-CM | POA: Diagnosis not present

## 2018-11-16 LAB — POCT URINALYSIS DIP (MANUAL ENTRY)
Bilirubin, UA: NEGATIVE
Blood, UA: NEGATIVE
Glucose, UA: NEGATIVE mg/dL
Ketones, POC UA: NEGATIVE mg/dL
Leukocytes, UA: NEGATIVE
Nitrite, UA: NEGATIVE
Protein Ur, POC: NEGATIVE mg/dL
Spec Grav, UA: 1.025 (ref 1.010–1.025)
Urobilinogen, UA: 0.2 E.U./dL
pH, UA: 6.5 (ref 5.0–8.0)

## 2018-11-16 NOTE — Progress Notes (Signed)
Patient [facility]   Established Patient Office Visit  Subjective:  Patient ID: Gregory Perry, male    DOB: 08/07/1950  Age: 68 y.o. MRN: [id number]  CC:  Chief Complaint  Patient presents with  . Follow-up    chronic condition     HPI Gregory Perry is a 68 year old male who presents for follow up today.   Past Medical History:  Diagnosis Date  . Alcohol abuse   . Candida esophagitis ([place])   . Cardiomyopathy ([place])   . CVA (cerebral infarction)   . Duodenal ulcer   . Dysphagia   . Headache(784.0)    history of  . Hypertension   . Severe protein-calorie malnutrition ([place])   . Stroke [facility])    Current Status: Since his last office visit, he is doing well with no complaints. His appetite remains decrease, but he states that he drinks 2 Boost supplements daily. He received samples from our office at his last visit and states that he is out. He eats a small breakfast occasional, he does not eat lunch, and he eats a snack for dinner. He has had a 20 lb weight loss in 1 year. He denies chest pain, cough, shortness of breath, heart palpitations, and falls. He has occasional headaches and dizziness with position changes. Denies severe headaches, confusion, seizures, double vision, and blurred vision, nausea and vomiting. He has appointment with Optometry on tomorrow, 11/17/2018 for discussion of Cataract removal. He did have recent follow up with Urology. He continues to smoke 7-8 cigarettes daily.   He denies fevers, chills, fatigue, recent infections, weight loss, and night sweats. He has not had any visual changes, and falls. No chest pain, heart palpitations, cough and shortness of breath reported. No reports of GI problems such as diarrhea, and constipation. He has no reports of blood in stools, dysuria and hematuria. No depression or anxiety reported. He has mild generalized pain today.   Past Surgical History:  Procedure Laterality  Date  . BALLOON DILATION N/A 11/22/2015   Procedure: BALLOON DILATION;  Surgeon: Mauri Pole, MD;  Location: [facility];  Service: Endoscopy;  Laterality: N/A;  . CARDIAC CATHETERIZATION N/A 01/17/2016   Procedure: Left Heart Cath and Coronary Angiography;  Surgeon: Sherren Mocha, MD;  Location: [place] CV LAB;  Service: Cardiovascular;  Laterality: N/A;  . DIRECT LARYNGOSCOPY  05/07/2011   Procedure: DIRECT LARYNGOSCOPY;  Surgeon: Rozetta Nunnery, MD;  Location: [place];  Service: ENT;  Laterality: N/A;  ESOPHAGOSCOPY  WITH DILATION  . EP IMPLANTABLE DEVICE N/A 11/17/2015   Procedure: Loop Recorder Insertion;  Surgeon: Evans Lance, MD;  Location: [place] CV LAB;  Service: Cardiovascular;  Laterality: N/A;  . ESOPHAGOGASTRODUODENOSCOPY  04/29/2011   Procedure: ESOPHAGOGASTRODUODENOSCOPY (EGD);  Surgeon: Winfield Cunas., MD;  Location: [facility];  Service: Endoscopy;  Laterality: N/A;  . ESOPHAGOGASTRODUODENOSCOPY N/A 11/22/2015   Procedure: ESOPHAGOGASTRODUODENOSCOPY (EGD);  Surgeon: Mauri Pole, MD;  Location: [facility] ENDOSCOPY;  Service: Endoscopy;  Laterality: N/A;  . LOOP RECORDER IMPLANT      Family History  Problem Relation Age of Onset  . Heart attack Maternal Grandfather     Social History   Socioeconomic History  . Marital status: Widowed    Spouse name: Not on file  . Number of children: Not on file  . Years of education: Not on file  . Highest education level: Not on file  Occupational History  .  Not on file  Social Needs  . Financial resource strain: Not on file  . Food insecurity    Worry: Not on file    Inability: Not on file  . Transportation needs    Medical: Not on file    Non-medical: Not on file  Tobacco Use  . Smoking status: Current Every Day Smoker    Packs/day: 0.50    Years: 30.00    Pack years: 15.00    Types: Cigarettes  . Smokeless tobacco: Never Used  . Tobacco comment: Pt smokes 6 to 7 a day trying to quit   Substance and Sexual Activity  . Alcohol use: No    Alcohol/week: 3.0 standard drinks    Types: 3 Shots of liquor per week    Comment: quit in 10/2015   . Drug use: Yes    Types: Marijuana    Comment: occ  . Sexual activity: Not on file  Lifestyle  . Physical activity    Days per week: Not on file    Minutes per session: Not on file  . Stress: Not on file  Relationships  . Social Herbalist on phone: Not on file    Gets together: Not on file    Attends religious service: Not on file    Active member of club or organization: Not on file    Attends meetings of clubs or organizations: Not on file    Relationship status: Not on file  . Intimate partner violence    Fear of current or ex partner: Not on file    Emotionally abused: Not on file    Physically abused: Not on file    Forced sexual activity: Not on file  Other Topics Concern  . Not on file  Social History Narrative  . Not on file    Outpatient Medications Prior to Visit  Medication Sig Dispense Refill  . amLODipine (NORVASC) 2.5 MG tablet TAKE 1 TABLET (2.5 MG TOTAL) BY MOUTH DAILY. 90 tablet 2  . apixaban (ELIQUIS) 5 MG TABS tablet Take 1 tablet (5 mg total) by mouth 2 (two) times daily. 60 tablet 3  . atorvastatin (LIPITOR) 40 MG tablet TAKE 1 TABLET BY MOUTH DAILY AT 6 PM. 30 tablet 3  . Blood Pressure Monitoring (BLOOD PRESSURE CUFF) MISC Check blood pressure daily between 8 pm -9 pm 1 each 0  . ferrous sulfate 325 (65 FE) MG tablet Take 1 tablet (325 mg total) by mouth daily. 30 tablet 3  . lisinopril (PRINIVIL,ZESTRIL) 40 MG tablet Take 1 tablet (40 mg total) by mouth daily. 30 tablet 3  . megestrol (MEGACE) 40 MG tablet Take 1 tablet (40 mg total) by mouth daily. 30 tablet 1  . metoprolol tartrate (LOPRESSOR) 25 MG tablet Take 1 tablet (25 mg total) by mouth 2 (two) times daily. 60 tablet 3  . pantoprazole (PROTONIX) 40 MG tablet TAKE 1 TABLET BY MOUTH 2 TIMES DAILY BEFORE A MEAL. 90 tablet 2  .  potassium chloride SA (K-DUR,KLOR-CON) 20 MEQ tablet Take 2 tablets (40 mEq total) by mouth daily. 180 tablet 3  . spironolactone (ALDACTONE) 25 MG tablet Take 1 tablet (25 mg total) by mouth daily. 30 tablet 3   No facility-administered medications prior to visit.     No Known Allergies  ROS Review of Systems  Constitutional: Negative.   HENT: Negative.   Eyes: Positive for visual disturbance (cataracts).  Respiratory: Negative.   Cardiovascular: Negative.   Gastrointestinal: Negative.  Endocrine: Negative.   Genitourinary: Negative.   Musculoskeletal: Negative.   Skin: Negative.   Allergic/Immunologic: Negative.   Neurological: Positive for dizziness (Occasional ) and headaches (Occasional).  Hematological: Negative.   Psychiatric/Behavioral: Negative.       Objective:    Physical Exam  Constitutional: He is oriented to person, place, and time. He appears well-developed and well-nourished.  HENT:  Head: Normocephalic and atraumatic.  Eyes: Conjunctivae are normal.  Neck: Normal range of motion. Neck supple.  Cardiovascular: Normal rate, regular rhythm, normal heart sounds and intact distal pulses.  Pulmonary/Chest: Effort normal and breath sounds normal.  Abdominal: Soft. Bowel sounds are normal.  Musculoskeletal: Normal range of motion.  Neurological: He is oriented to person, place, and time.  Skin: Skin is warm and dry.  Psychiatric: He has a normal mood and affect. His behavior is normal. Judgment and thought content normal.  Nursing note and vitals reviewed.   BP 124/78 (BP Location: Left Arm, Patient Position: Sitting, Cuff Size: Small)   Pulse 68   Temp 97.9 F (36.6 C) (Oral)   Ht 5\' 11"  (1.803 m)   Wt 151 lb 12.8 oz (68.9 kg)   SpO2 100%   BMI 21.17 kg/m  Wt Readings from Last 3 Encounters:  11/16/18 151 lb 12.8 oz (68.9 kg)  10/14/18 158 lb (71.7 kg)  09/14/18 157 lb (71.2 kg)     Health Maintenance Due  Topic Date Due  . COLONOSCOPY   09/27/2002    There are no preventive care reminders to display for this patient.  Lab Results  Component Value Date   TSH 0.444 (L) 08/03/2018   Lab Results  Component Value Date   WBC 4.5 08/03/2018   HGB 8.3 (L) 08/03/2018   HCT 29.3 (L) 08/03/2018   MCV 73 (L) 08/03/2018   PLT 423 08/03/2018   Lab Results  Component Value Date   NA 145 (H) 08/03/2018   K 3.7 08/03/2018   CO2 22 08/03/2018   GLUCOSE 90 08/03/2018   BUN 12 08/03/2018   CREATININE 0.99 08/03/2018   BILITOT 0.6 08/03/2018   ALKPHOS 81 08/03/2018   AST 24 08/03/2018   ALT 10 08/03/2018   PROT 7.8 08/03/2018   ALBUMIN 4.2 08/03/2018   CALCIUM 9.7 08/03/2018   ANIONGAP 5 01/17/2016   Lab Results  Component Value Date   CHOL 154 08/03/2018   Lab Results  Component Value Date   HDL 63 08/03/2018   Lab Results  Component Value Date   LDLCALC 81 08/03/2018   Lab Results  Component Value Date   TRIG 48 08/03/2018   Lab Results  Component Value Date   CHOLHDL 2.4 08/03/2018   Lab Results  Component Value Date   HGBA1C 5.0 09/13/2016      Assessment & Plan:   1. Decreased appetite Encouraged to eat 6 small meals daily. Samples of Boost and [employer] mix given to patient today, by Manuela Schwartz, Education officer, museum at [employer]. We will continue to monitor.  2. Weight loss, unintentional 20 lb weight loss in 1 year. We will continue to monitor.   3. Essential hypertension The current medical regimen is effective; blood pressure is stable at 124/78 today; continue present plan and medications as prescribed. He will continue to decrease high sodium intake, excessive alcohol intake, increase potassium intake, smoking cessation, and increase physical activity of at least 30 minutes of cardio activity daily. He will continue to follow Heart Healthy or DASH diet. - POCT  urinalysis dipstick  4. Vision changes Accelerated Cataracts.  5. Age-related cataract of both eyes, unspecified  age-related cataract type Keep appointment on 11/17/2018.  6. Need for pneumococcal vaccine - Pneumococcal conjugate vaccine 13-valent IM  7. Elevated PSA, between 10 and less than 20 ng/ml  8. Need for Tdap vaccination - Tdap vaccine greater than or equal to 7yo IM  9. Follow up He will follow up in 3 months.   No orders of the defined types were placed in this encounter.   Orders Placed This Encounter  Procedures  . Tdap vaccine greater than or equal to 7yo IM  . Pneumococcal conjugate vaccine 13-valent IM  . POCT urinalysis dipstick    Referral Orders  No referral(s) requested today    Kathe Becton,  MSN, FNP-BC Patient [place], [place] [phone]    Problem List Items Addressed This Visit      Cardiovascular and Mediastinum   Essential hypertension   Relevant Orders   POCT urinalysis dipstick (Completed)     Other   Age-related cataract of both eyes   Decreased appetite - Primary   Vision changes   Weight loss, unintentional    Other Visit Diagnoses    Need for pneumococcal vaccine       Relevant Orders   Pneumococcal conjugate vaccine 13-valent IM (Completed)   Elevated PSA, between 10 and less than 20 ng/ml       Need for Tdap vaccination       Relevant Orders   Tdap vaccine greater than or equal to 7yo IM (Completed)   Follow up          No orders of the defined types were placed in this encounter.   Follow-up: Return in about 3 months (around 02/16/2019).    Azzie Glatter, FNP

## 2018-11-17 DIAGNOSIS — H2511 Age-related nuclear cataract, right eye: Secondary | ICD-10-CM | POA: Diagnosis not present

## 2018-11-17 DIAGNOSIS — H25011 Cortical age-related cataract, right eye: Secondary | ICD-10-CM | POA: Diagnosis not present

## 2018-12-02 DIAGNOSIS — H25811 Combined forms of age-related cataract, right eye: Secondary | ICD-10-CM | POA: Diagnosis not present

## 2018-12-02 DIAGNOSIS — H25011 Cortical age-related cataract, right eye: Secondary | ICD-10-CM | POA: Diagnosis not present

## 2018-12-02 DIAGNOSIS — H2511 Age-related nuclear cataract, right eye: Secondary | ICD-10-CM | POA: Diagnosis not present

## 2018-12-03 MED FILL — MEGESTROL 40 MG TABLET: 40 | 30 days supply | Qty: 30 | Fill #1

## 2018-12-03 MED FILL — LATANOPROST 0.005% EYE DRP: 0.005 | 25 days supply | Qty: 3 | Fill #1

## 2018-12-07 ENCOUNTER — Other Ambulatory Visit: Payer: Self-pay | Admitting: Family Medicine

## 2018-12-07 DIAGNOSIS — R634 Abnormal weight loss: Secondary | ICD-10-CM

## 2018-12-07 DIAGNOSIS — R630 Anorexia: Secondary | ICD-10-CM

## 2018-12-07 MED FILL — SPIRONOLACTONE 25 MG TABLET: 25 | 30 days supply | Qty: 30 | Fill #9

## 2018-12-07 MED FILL — AMLODIPINE BESYLATE 2.5 MG: 2.5 | 30 days supply | Qty: 30 | Fill #2

## 2018-12-07 MED FILL — ELIQUIS 5 MG TABLET: 5 | 30 days supply | Qty: 60 | Fill #3

## 2018-12-08 ENCOUNTER — Ambulatory Visit (INDEPENDENT_AMBULATORY_CARE_PROVIDER_SITE_OTHER): Payer: Medicare Other | Admitting: *Deleted

## 2018-12-08 DIAGNOSIS — I255 Ischemic cardiomyopathy: Secondary | ICD-10-CM

## 2018-12-08 DIAGNOSIS — I480 Paroxysmal atrial fibrillation: Secondary | ICD-10-CM

## 2018-12-08 LAB — CUP PACEART REMOTE DEVICE CHECK
Date Time Interrogation Session: 20200714180941
Implantable Pulse Generator Implant Date: 20170623

## 2018-12-18 MED FILL — levoFLOXacin 750 MG TABS: 750 | 1 days supply | Qty: 1 | Fill #0

## 2018-12-22 DIAGNOSIS — R972 Elevated prostate specific antigen [PSA]: Secondary | ICD-10-CM | POA: Diagnosis not present

## 2018-12-22 DIAGNOSIS — C61 Malignant neoplasm of prostate: Secondary | ICD-10-CM | POA: Diagnosis not present

## 2018-12-24 NOTE — Progress Notes (Signed)
Carelink Summary Report / Loop Recorder 

## 2018-12-26 DIAGNOSIS — C61 Malignant neoplasm of prostate: Secondary | ICD-10-CM

## 2018-12-26 HISTORY — DX: Malignant neoplasm of prostate: C61

## 2018-12-30 DIAGNOSIS — C61 Malignant neoplasm of prostate: Secondary | ICD-10-CM | POA: Diagnosis not present

## 2019-01-01 ENCOUNTER — Encounter: Payer: Self-pay | Admitting: Family Medicine

## 2019-01-04 ENCOUNTER — Telehealth: Payer: Self-pay | Admitting: *Deleted

## 2019-01-04 NOTE — Telephone Encounter (Signed)
Letter was mailed after several attempts trying to reach the patient.

## 2019-01-08 ENCOUNTER — Telehealth: Payer: Self-pay | Admitting: *Deleted

## 2019-01-08 DIAGNOSIS — C61 Malignant neoplasm of prostate: Secondary | ICD-10-CM | POA: Diagnosis not present

## 2019-01-08 NOTE — Telephone Encounter (Signed)
LVM for callback 01/08/19

## 2019-01-11 ENCOUNTER — Ambulatory Visit (INDEPENDENT_AMBULATORY_CARE_PROVIDER_SITE_OTHER): Payer: Medicare Other | Admitting: *Deleted

## 2019-01-11 DIAGNOSIS — I480 Paroxysmal atrial fibrillation: Secondary | ICD-10-CM

## 2019-01-11 DIAGNOSIS — I639 Cerebral infarction, unspecified: Secondary | ICD-10-CM

## 2019-01-11 LAB — CUP PACEART REMOTE DEVICE CHECK
Date Time Interrogation Session: 20200816183934
Implantable Pulse Generator Implant Date: 20170623

## 2019-01-18 NOTE — Progress Notes (Signed)
GU Location of Tumor / Histology: prostatic adenocarcinoma  If Prostate Cancer, Gleason Score is (4 + 3) and PSA is (10.3). Prostate volume: 49.93 grams. 6 of 12 cores involved.  Biopsies of prostate (if applicable) revealed:   Past/Anticipated interventions by urology, if any: prostate biopsy, referral for consideration of radiation therapy. Patient denies receiving any ADT injections.   Past/Anticipated interventions by medical oncology, if any: no  Weight changes, if any: Reports no appetite. Reports taking Megace. Lost ten plus pounds in less than one month.   Bowel/Bladder complaints, if any: IPSS 11. SHIM 1. Denies dysuria or hematuria. Denies urinary leakage or incontinence.  Denies any bowel complaints.  Nausea/Vomiting, if any: no  Pain issues, if any:  no  SAFETY ISSUES:  Prior radiation? no  Pacemaker/ICD? Loop recorder  Possible current pregnancy? no, male patient  Is the patient on methotrexate? no  Current Complaints / other details:  68 year old male. Legally seperated. Has loop recorder. 1/2 ppd smoker. Denies a family hx of cancer.

## 2019-01-19 ENCOUNTER — Other Ambulatory Visit: Payer: Self-pay

## 2019-01-19 ENCOUNTER — Encounter: Payer: Self-pay | Admitting: Radiation Oncology

## 2019-01-19 ENCOUNTER — Ambulatory Visit
Admission: RE | Admit: 2019-01-19 | Discharge: 2019-01-19 | Disposition: A | Discharge status: Home / Self Care | Payer: Medicare Other | Source: Ambulatory Visit | Attending: Radiation Oncology | Admitting: Radiation Oncology

## 2019-01-19 ENCOUNTER — Other Ambulatory Visit: Payer: Self-pay | Admitting: Urology

## 2019-01-19 VITALS — Ht 71.0 in | Wt 140.0 lb

## 2019-01-19 DIAGNOSIS — C61 Malignant neoplasm of prostate: Secondary | ICD-10-CM

## 2019-01-19 DIAGNOSIS — R972 Elevated prostate specific antigen [PSA]: Secondary | ICD-10-CM | POA: Diagnosis not present

## 2019-01-19 DIAGNOSIS — D4959 Neoplasm of unspecified behavior of other genitourinary organ: Secondary | ICD-10-CM

## 2019-01-19 NOTE — Progress Notes (Signed)
Radiation Oncology         [phone] ________________________________  Initial outpatient Consultation - Conducted via Telephone due to current COVID-19 concerns for limiting patient exposure  Name: Gregory Perry MRN: [id number]  Date: 01/19/2019  DOB: 1950/08/17  MR:[id number], Ellie Lunch, FNP  Festus Aloe, MD   REFERRING PHYSICIAN: Festus Aloe, MD  DIAGNOSIS: 68 y.o. gentleman with Stage T1c adenocarcinoma of the prostate with Gleason score of 4+3, and PSA of 10.3.    ICD-10-CM   1. Malignant neoplasm of prostate ([place])  C61     HISTORY OF PRESENT ILLNESS: Gregory Perry is a 68 y.o. male with a diagnosis of prostate cancer. He was noted to have an elevated PSA of 11.9 by his primary care provider, Kathe Becton, NP, on 11/10/2017. Accordingly, he was referred for evaluation in urology by Dr. Junious Silk on 03/16/2018,  digital rectal examination was performed at that time was normal. A repeat PSA at that time was decreased to 8.5 but remained elevated nonetheless. He was to be scheduled for prostate biopsy at that time, but he did not follow up.  Unfortunately, his PSA rose to 13.1 in 07/2018, and remained elevated at 10.4 when checked most recently in 09/2018. He was referred back to Dr. Junious Silk on 11/02/2018. The patient proceeded to transrectal ultrasound with 12 biopsies of the prostate on 12/22/2018.  The prostate volume measured 50 cc.  Out of 12 core biopsies, 6 were positive.  The maximum Gleason score was 4+3, and this was seen in left apex lateral. Gleason 3+4 was seen in left mid lateral, left mid, and left apex. Gleason 3+3 was seen in right apex and right mid lateral.  For disease staging, he underwent CT A/P on 01/08/2019, which was negative for lymph node involvement or other evidence of visceral metastatic disease.  He has not had any bone imaging to date.  The patient reviewed the biopsy results with his urologist and he has kindly been referred today for discussion  of potential radiation treatment options.  PREVIOUS RADIATION THERAPY: No  PAST MEDICAL HISTORY:  Past Medical History:  Diagnosis Date  . Alcohol abuse   . Candida esophagitis ([place])   . Cardiomyopathy ([place])   . CVA (cerebral infarction)   . Duodenal ulcer   . Dysphagia   . Headache(784.0)    history of  . Hypertension   . Prostate cancer ([place]) 12/2018  . Severe protein-calorie malnutrition ([place])   . Stroke [facility])       PAST SURGICAL HISTORY: Past Surgical History:  Procedure Laterality Date  . BALLOON DILATION N/A 11/22/2015   Procedure: BALLOON DILATION;  Surgeon: Mauri Pole, MD;  Location: [facility];  Service: Endoscopy;  Laterality: N/A;  . CARDIAC CATHETERIZATION N/A 01/17/2016   Procedure: Left Heart Cath and Coronary Angiography;  Surgeon: Sherren Mocha, MD;  Location: [facility];  Service: Cardiovascular;  Laterality: N/A;  . DIRECT LARYNGOSCOPY  05/07/2011   Procedure: DIRECT LARYNGOSCOPY;  Surgeon: Rozetta Nunnery, MD;  Location: [facility];  Service: ENT;  Laterality: N/A;  ESOPHAGOSCOPY  WITH DILATION  . EP IMPLANTABLE DEVICE N/A 11/17/2015   Procedure: Loop Recorder Insertion;  Surgeon: Evans Lance, MD;  Location: [facility];  Service: Cardiovascular;  Laterality: N/A;  . ESOPHAGOGASTRODUODENOSCOPY  04/29/2011   Procedure: ESOPHAGOGASTRODUODENOSCOPY (EGD);  Surgeon: Winfield Cunas., MD;  Location: [facility];  Service: Endoscopy;  Laterality: N/A;  . ESOPHAGOGASTRODUODENOSCOPY N/A 11/22/2015   Procedure: ESOPHAGOGASTRODUODENOSCOPY (EGD);  Surgeon: Mauri Pole, MD;  Location: [facility];  Service: Endoscopy;  Laterality: N/A;  . LOOP RECORDER IMPLANT      FAMILY HISTORY:  Family History  Problem Relation Age of Onset  . Heart attack Maternal Grandfather   . Colon cancer Neg Hx   . Breast cancer Neg Hx   . Prostate cancer Neg Hx     SOCIAL HISTORY:  Social History   Socioeconomic History  . Marital status: Legally  Separated    Spouse name: Not on file  . Number of children: 1  . Years of education: Not on file  . Highest education level: Not on file  Occupational History    Comment: retired  Scientific laboratory technician  . Financial resource strain: Not on file  . Food insecurity    Worry: Not on file    Inability: Not on file  . Transportation needs    Medical: Not on file    Non-medical: Not on file  Tobacco Use  . Smoking status: Current Every Day Smoker    Packs/day: 0.50    Years: 30.00    Pack years: 15.00    Types: Cigarettes  . Smokeless tobacco: Never Used  . Tobacco comment: Pt smokes 6 to 7 a day trying to quit  Substance and Sexual Activity  . Alcohol use: No    Alcohol/week: 3.0 standard drinks    Types: 3 Shots of liquor per week    Comment: quit in 10/2015   . Drug use: Yes    Types: Marijuana    Comment: occ  . Sexual activity: Not Currently  Lifestyle  . Physical activity    Days per week: Not on file    Minutes per session: Not on file  . Stress: Not on file  Relationships  . Social Herbalist on phone: Not on file    Gets together: Not on file    Attends religious service: Not on file    Active member of club or organization: Not on file    Attends meetings of clubs or organizations: Not on file    Relationship status: Not on file  . Intimate partner violence    Fear of current or ex partner: Not on file    Emotionally abused: Not on file    Physically abused: Not on file    Forced sexual activity: Not on file  Other Topics Concern  . Not on file  Social History Narrative  . Not on file    ALLERGIES: Patient has no known allergies.  MEDICATIONS:  Current Outpatient Medications  Medication Sig Dispense Refill  . amLODipine (NORVASC) 2.5 MG tablet TAKE 1 TABLET (2.5 MG TOTAL) BY MOUTH DAILY. 90 tablet 2  . apixaban (ELIQUIS) 5 MG TABS tablet Take 1 tablet (5 mg total) by mouth 2 (two) times daily. 60 tablet 3  . atorvastatin (LIPITOR) 40 MG tablet TAKE 1  TABLET BY MOUTH DAILY AT 6 PM. 30 tablet 3  . Blood Pressure Monitoring (BLOOD PRESSURE CUFF) MISC Check blood pressure daily between 8 pm -9 pm 1 each 0  . lisinopril (PRINIVIL,ZESTRIL) 40 MG tablet Take 1 tablet (40 mg total) by mouth daily. 30 tablet 3  . megestrol (MEGACE) 40 MG tablet TAKE 1 TABLET BY MOUTH DAILY. 30 tablet 1  . metoprolol tartrate (LOPRESSOR) 25 MG tablet Take 1 tablet (25 mg total) by mouth 2 (two) times daily. 60 tablet 3  . pantoprazole (PROTONIX) 40 MG tablet TAKE 1  TABLET BY MOUTH 2 TIMES DAILY BEFORE A MEAL. 90 tablet 2  . potassium chloride SA (K-DUR,KLOR-CON) 20 MEQ tablet Take 2 tablets (40 mEq total) by mouth daily. 180 tablet 3  . ferrous sulfate 325 (65 FE) MG tablet Take 1 tablet (325 mg total) by mouth daily. 30 tablet 3  . spironolactone (ALDACTONE) 25 MG tablet Take 1 tablet (25 mg total) by mouth daily. 30 tablet 3   No current facility-administered medications for this encounter.     REVIEW OF SYSTEMS:  On review of systems, the patient reports that he is doing well overall. He denies any chest pain, shortness of breath, cough, fevers, chills, night sweats. He denies any bowel disturbances, and denies abdominal pain, nausea or vomiting. He denies any new musculoskeletal or joint aches or pains. He reports an unintentional weight loss of 10+ pounds in less than a month related to decreased appetite. His IPSS was 11, indicating moderate urinary symptoms. He is not currently on any medications for BPH and denies obstructive urinary symptoms. His SHIM was 1, indicating he has severe erectile dysfunction. A complete review of systems is obtained and is otherwise negative.  PHYSICAL EXAM:  Wt Readings from Last 3 Encounters:  01/19/19 140 lb (63.5 kg)  11/16/18 151 lb 12.8 oz (68.9 kg)  10/14/18 158 lb (71.7 kg)   Temp Readings from Last 3 Encounters:  11/16/18 97.9 F (36.6 C) (Oral)  10/14/18 97.8 F (36.6 C) (Oral)  09/14/18 97.9 F (36.6 C) (Oral)    BP Readings from Last 3 Encounters:  11/16/18 124/78  10/14/18 140/80  09/14/18 136/82   Pulse Readings from Last 3 Encounters:  11/16/18 68  10/14/18 60  09/14/18 60   Pain Assessment Pain Score: 0-No pain/10  Unable to assess due to telephone consult format.  KPS = 90  100 - Normal; no complaints; no evidence of disease. 90   - Able to carry on normal activity; minor signs or symptoms of disease. 80   - Normal activity with effort; some signs or symptoms of disease. 61   - Cares for self; unable to carry on normal activity or to do active work. 60   - Requires occasional assistance, but is able to care for most of his personal needs. 50   - Requires considerable assistance and frequent medical care. 3   - Disabled; requires special care and assistance. 51   - Severely disabled; hospital admission is indicated although death not imminent. 73   - Very sick; hospital admission necessary; active supportive treatment necessary. 10   - Moribund; fatal processes progressing rapidly. 0     - Dead  Karnofsky DA, Abelmann [place], Craver LS and Burchenal [facility] [phone]) The use of the nitrogen mustards in the palliative treatment of carcinoma: with particular reference to bronchogenic carcinoma Cancer 1 634-56  LABORATORY DATA:  Lab Results  Component Value Date   WBC 4.5 08/03/2018   HGB 8.3 (L) 08/03/2018   HCT 29.3 (L) 08/03/2018   MCV 73 (L) 08/03/2018   PLT 423 08/03/2018   Lab Results  Component Value Date   NA 145 (H) 08/03/2018   K 3.7 08/03/2018   CL 101 08/03/2018   CO2 22 08/03/2018   Lab Results  Component Value Date   ALT 10 08/03/2018   AST 24 08/03/2018   ALKPHOS 81 08/03/2018   BILITOT 0.6 08/03/2018     RADIOGRAPHY: No results found.    IMPRESSION/PLAN: 1. 68 y.o. gentleman with Stage T1c  adenocarcinoma of the prostate with Gleason Score of 4+3, and PSA of 10.3. This visit was conducted via Telephone to spare the patient unnecessary potential exposure in the  healthcare setting during the current COVID-19 pandemic. We discussed the patient's workup and outlined the nature of prostate cancer in this setting. The patient's T stage, Gleason's score, and PSA put him into the unfavorable intermediate risk group. Accordingly, he is eligible for a variety of potential treatment options including brachytherapy, 5.5 weeks of external radiation or prostatectomy. We discussed the available radiation techniques, and focused on the details and logistics and delivery. We discussed and outlined the risks, benefits, short and long-term effects associated with radiotherapy and compared and contrasted these with prostatectomy. We discussed the role of SpaceOAR in reducing the rectal toxicity associated with radiotherapy. He is felt to be a high risk surgical candidate given his multiple medical co-morbidities and need for chronic anticoagulation.  At the end of the conversation, the patient is interested in moving forward with 5.5 weeks of external beam therapy without ADT. We will arrange for the patient to have a Bone Scan in the near future to complete his disease staging and pending there are no findings to suggest osseous metastasis, we will proceed with  coordinating fiducial marker with SpaceOAR gel placement to reduce rectal toxicity from radiotherapy prior to CT simulation for treatment planning.  We will share our discussion with Dr. Junious Silk and move forward with treatment planning in anticipation of beginning a 5.5 week course of IMRT in the near future.   Given current concerns for patient exposure during the COVID-19 pandemic, this encounter was conducted via telephone. The patient was notified in advance and was offered a MyChart meeting to allow for face to face communication but unfortunately reported that he did not have the appropriate resources/technology to support such a visit and instead preferred to proceed with telephone consult. The patient has given  verbal consent for this type of encounter. The time spent during this encounter was 40 minutes. The attendants for this meeting include Tyler Pita MD, Ashlyn Bruning PA-C, [place], patient Kylle, and his sister. During the encounter, Tyler Pita MD, Ashlyn Bruning PA-C, and scribe, Wilburn Mylar were located at [place].  Patient Mackey and his sister were located at home.    Nicholos Johns, PA-C    Tyler Pita, MD  [facility] Direct Dial: [phone]  Fax: [phone] [email or web address]  Skype  LinkedIn  This document serves as a record of services personally performed by Tyler Pita, MD and Freeman Caldron, PA-C. It was created on their behalf by Wilburn Mylar, a trained medical scribe. The creation of this record is based on the scribe's personal observations and the provider's statements to them. This document has been checked and approved by the attending provider.

## 2019-01-19 NOTE — Progress Notes (Signed)
See progress notes under physician encounter. 

## 2019-01-19 NOTE — Progress Notes (Signed)
Carelink Summary Report / Loop Recorder 

## 2019-01-20 ENCOUNTER — Telehealth: Payer: Self-pay | Admitting: *Deleted

## 2019-01-20 NOTE — Telephone Encounter (Signed)
CALLED PATIENT TO INFORM OF BONE SCAN FOR 01-27-19 - ARRIVAL TIME- 9:30 AM @ [facility] AND RETURN VISIT @ 1 PM ON 01/27/19, LVM  FOR A RETURN CALL

## 2019-01-27 ENCOUNTER — Encounter (HOSPITAL_COMMUNITY): Payer: Medicare Other

## 2019-01-27 ENCOUNTER — Ambulatory Visit (HOSPITAL_COMMUNITY): Payer: Medicare Other

## 2019-02-02 ENCOUNTER — Encounter: Payer: Self-pay | Admitting: Medical Oncology

## 2019-02-02 ENCOUNTER — Telehealth: Payer: Self-pay | Admitting: Medical Oncology

## 2019-02-02 NOTE — Telephone Encounter (Signed)
Called patient with bone scan appointment 9/14 arriving at 10:45 am for 11:00 am injection. He will return at 2 pm for scan. I encouraged him to write the appointment down, and I will call him Friday afternoon to remind him of appointment. He verbalized appointment date and time back to me.

## 2019-02-02 NOTE — Telephone Encounter (Signed)
Spoke with Gregory Perry to follow up post consult 01/19/19 with Dr. Tammi Klippel. I introduced myself as the prostate nurse navigator and discussed my role. He was scheduled for bone scan 9/2 but scan was not done. He states he thought he had it done, but after discussion, he voiced he did not come. I explained the purpose of the scan and how important it is so we can make sure he gets the proper treatment for his cancer.  I will get scan rescheduled for him and call him back with instructions. He voiced understanding of the above.

## 2019-02-03 MED FILL — AMLODIPINE BESYLATE 2.5 MG: 2.5 | 30 days supply | Qty: 30 | Fill #3

## 2019-02-05 ENCOUNTER — Encounter: Payer: Self-pay | Admitting: Medical Oncology

## 2019-02-08 ENCOUNTER — Ambulatory Visit (HOSPITAL_COMMUNITY)
Admission: RE | Admit: 2019-02-08 | Discharge: 2019-02-08 | Disposition: A | Discharge status: Home / Self Care | Payer: Medicare Other | Source: Ambulatory Visit | Attending: Urology | Admitting: Urology

## 2019-02-08 ENCOUNTER — Other Ambulatory Visit: Payer: Self-pay

## 2019-02-08 DIAGNOSIS — D4959 Neoplasm of unspecified behavior of other genitourinary organ: Secondary | ICD-10-CM

## 2019-02-08 DIAGNOSIS — C61 Malignant neoplasm of prostate: Secondary | ICD-10-CM | POA: Diagnosis not present

## 2019-02-08 MED ORDER — TECHNETIUM TC 99M MEDRONATE IV KIT
20.3000 | PACK | Freq: Once | INTRAVENOUS | Status: AC | PRN
  Administered 2019-02-08: 11:00:00 20.3 via INTRAVENOUS

## 2019-02-09 ENCOUNTER — Telehealth: Payer: Self-pay | Admitting: *Deleted

## 2019-02-09 ENCOUNTER — Encounter: Payer: Self-pay | Admitting: Urology

## 2019-02-09 NOTE — Telephone Encounter (Signed)
CALLED PATIENT TO INFORM THAT I HAVE CALLED [facility] TO SCHEDULE HIS FID. MARKERS AND SPACE OAR, LVM FOR A RETURN CALL

## 2019-02-09 NOTE — Progress Notes (Signed)
I left a message at patient's listed home (Brother-[name]) and mobile (sister- [name]) to inform that Bone scan did NOT show any bony metastasis so we will move forward with coordinating fiducial markers and SpaceOAR gel with Dr. Regan Rakers, first available, prior to [facility], in anticipation of beginning 5.5 weeks prostate IMRT without ADT, in the near future. Romie Jumper will be in touch with patient and family to coordinate.  Nicholos Johns, MMS, PA-C [place] [facility] [place]: [phone]  Fax: [phone]

## 2019-02-10 ENCOUNTER — Encounter: Payer: Self-pay | Admitting: Radiation Oncology

## 2019-02-10 NOTE — Progress Notes (Signed)
Faxed PACEMAKER/ICD form to the device clinic for Dr. Cristopher Peru to complete. This is my second attempt. Fax confirmation of delivery obtained.

## 2019-02-12 ENCOUNTER — Ambulatory Visit (INDEPENDENT_AMBULATORY_CARE_PROVIDER_SITE_OTHER): Payer: Medicare Other | Admitting: *Deleted

## 2019-02-12 DIAGNOSIS — I639 Cerebral infarction, unspecified: Secondary | ICD-10-CM | POA: Diagnosis not present

## 2019-02-13 LAB — CUP PACEART REMOTE DEVICE CHECK
Date Time Interrogation Session: 20200918183906
Implantable Pulse Generator Implant Date: 20170623

## 2019-02-15 ENCOUNTER — Encounter: Payer: Self-pay | Admitting: Cardiology

## 2019-02-15 NOTE — Progress Notes (Signed)
Carelink Summary Report / Loop Recorder 

## 2019-02-17 ENCOUNTER — Ambulatory Visit: Payer: Medicare Other | Admitting: Family Medicine

## 2019-03-01 ENCOUNTER — Telehealth: Payer: Self-pay | Admitting: Internal Medicine

## 2019-03-01 NOTE — Telephone Encounter (Signed)
°  ° °  [facility] Pre-operative Risk Assessment    Request for surgical clearance:  1. What type of surgery is being performed? Fiducial markers OAR  2. When is this surgery scheduled? Pending Clearance  3. What type of clearance is required (medical clearance vs. Pharmacy clearance to hold med vs. Both)? both  4. Are there any medications that need to be held prior to surgery and how long?: eliquis for 3 days prior to procedure  5. Practice name and name of physician performing surgery? : Festus Aloe, [facility]  6. What is your office phone number: [phone] x [phone]   7.   What is your office fax number: [phone]   8.   Anesthesia type (None, local, MAC, general) ? local   Johnna Acosta 03/01/2019, 9:29 AM  _________________________________________________________________   (provider comments below)

## 2019-03-01 NOTE — Telephone Encounter (Signed)
Patient with diagnosis of afib on Eliquis for anticoagulation.    Procedure: Fiducial markers OAR Date of procedure: Pending Clearance  CHADS2-VASc score of  5 (CHF, HTN, AGE, stroke/tia x 2)  CrCl 66  Pt is at high risk off anticoagulation with history of stroke. MD is asking for a 3 day hold on Xarelto. Will route to MD for opinion.

## 2019-03-01 NOTE — Telephone Encounter (Signed)
   Primary Cardiologist:Gregg Lovena Le, MD  Chart reviewed as part of pre-operative protocol coverage. Because of Gregory Perry's past medical history and time since last visit, he/she will require a follow-up visit in order to better assess preoperative cardiovascular risk.  Pre-op covering staff: - Please schedule appointment and call patient to inform them. - Please contact requesting surgeon's office via preferred method (i.e, phone, fax) to inform them of need for appointment prior to surgery.  If applicable, this message will also be routed to pharmacy pool and/or primary cardiologist for input on holding anticoagulant/antiplatelet agent as requested below so that this information is available at time of patient's appointment.   Kathyrn Drown, NP  03/01/2019, 11:51 AM

## 2019-03-03 NOTE — Telephone Encounter (Signed)
I s/w EP Scheduler Gracy Bruins who states she left message yesterday for pt to call back to schedule appt.

## 2019-03-04 ENCOUNTER — Other Ambulatory Visit: Payer: Self-pay | Admitting: Urology

## 2019-03-04 DIAGNOSIS — C61 Malignant neoplasm of prostate: Secondary | ICD-10-CM

## 2019-03-04 NOTE — Telephone Encounter (Signed)
Pt has appt 10/19 with Oda Kilts, [facility] for surgery clearance. I will route clearance information to PA for appt. I will remove from the call back pool.

## 2019-03-04 NOTE — Telephone Encounter (Signed)
Discussed with Dr Lovena Le who is ok with pt holding his Xarelto for 3 days prior to procedure.

## 2019-03-12 NOTE — Progress Notes (Signed)
Electrophysiology Office Note Date: 03/15/2019  ID:  Gregory Perry, DOB 11-08-1950, MRN [id number]  PCP: Azzie Glatter, FNP Primary Cardiologist: Cristopher Peru, MD Electrophysiologist: None  CC: Pacemaker follow-up  Gregory Perry is a 68 y.o. male with history of cryptogenic stroke s/p IL and subsequent diagnosis of atrial fibrillation seen today for Dr. Lovena Le. he presents today for routine electrophysiology followup and cardiac clearance for Fiducial Markers OAR for prostate CA.  Since last being seen in our clinic, the patient reports doing very well.  he denies chest pain, palpitations, dyspnea, PND, orthopnea, nausea, vomiting, dizziness, syncope, edema, weight gain, or early satiety.  Device History: Medtronic ILR implanted 10/28/2015 for cryptogenic stroke  Past Medical History:  Diagnosis Date  . Alcohol abuse   . Candida esophagitis ([place])   . Cardiomyopathy ([place])   . CVA (cerebral infarction)   . Duodenal ulcer   . Dysphagia   . Headache(784.0)    history of  . Hypertension   . Prostate cancer ([place]) 12/2018  . Severe protein-calorie malnutrition ([place])   . Stroke [facility])    Past Surgical History:  Procedure Laterality Date  . BALLOON DILATION N/A 11/22/2015   Procedure: BALLOON DILATION;  Surgeon: Mauri Pole, MD;  Location: [facility] ENDOSCOPY;  Service: Endoscopy;  Laterality: N/A;  . CARDIAC CATHETERIZATION N/A 01/17/2016   Procedure: Left Heart Cath and Coronary Angiography;  Surgeon: Sherren Mocha, MD;  Location: [facility] CV LAB;  Service: Cardiovascular;  Laterality: N/A;  . DIRECT LARYNGOSCOPY  05/07/2011   Procedure: DIRECT LARYNGOSCOPY;  Surgeon: Rozetta Nunnery, MD;  Location: [facility];  Service: ENT;  Laterality: N/A;  ESOPHAGOSCOPY  WITH DILATION  . EP IMPLANTABLE DEVICE N/A 11/17/2015   Procedure: Loop Recorder Insertion;  Surgeon: Evans Lance, MD;  Location: [facility] CV LAB;  Service: Cardiovascular;  Laterality: N/A;  .  ESOPHAGOGASTRODUODENOSCOPY  04/29/2011   Procedure: ESOPHAGOGASTRODUODENOSCOPY (EGD);  Surgeon: Winfield Cunas., MD;  Location: [facility];  Service: Endoscopy;  Laterality: N/A;  . ESOPHAGOGASTRODUODENOSCOPY N/A 11/22/2015   Procedure: ESOPHAGOGASTRODUODENOSCOPY (EGD);  Surgeon: Mauri Pole, MD;  [facility] ENDOSCOPY;  Service: Endoscopy;  Laterality: N/A;  . LOOP RECORDER IMPLANT      Current Outpatient Medications  Medication Sig Dispense Refill  . Blood Pressure Monitoring (BLOOD PRESSURE CUFF) MISC Check blood pressure daily between 8 pm -9 pm 1 each 0  . ferrous sulfate 325 (65 FE) MG tablet Take 1 tablet (325 mg total) by mouth daily. 30 tablet 3  . megestrol (MEGACE) 40 MG tablet TAKE 1 TABLET BY MOUTH DAILY. 30 tablet 1  . amLODipine (NORVASC) 2.5 MG tablet Take 1 tablet (2.5 mg total) by mouth daily. 30 tablet 0  . apixaban (ELIQUIS) 5 MG TABS tablet Take 1 tablet (5 mg total) by mouth 2 (two) times daily. 60 tablet 0  . atorvastatin (LIPITOR) 40 MG tablet TAKE 1 TABLET BY MOUTH DAILY AT 6 PM. 30 tablet 0  . lisinopril (ZESTRIL) 40 MG tablet Take 1 tablet (40 mg total) by mouth daily. 30 tablet 0  . metoprolol tartrate (LOPRESSOR) 25 MG tablet Take 1 tablet (25 mg total) by mouth 2 (two) times daily. 60 tablet 0  . pantoprazole (PROTONIX) 40 MG tablet Take 1 tablet (40 mg total) by mouth 2 (two) times daily. 60 tablet 0  . potassium chloride SA (KLOR-CON) 20 MEQ tablet Take 2 tablets (40 mEq total) by mouth daily. 60 tablet 0  . spironolactone (ALDACTONE)  25 MG tablet Take 1 tablet (25 mg total) by mouth daily. 30 tablet 0   No current facility-administered medications for this visit.     Allergies:   Patient has no known allergies.   Social History: Social History   Socioeconomic History  . Marital status: Legally Separated    Spouse name: Not on file  . Number of children: 1  . Years of education: Not on file  . Highest education level: Not on file   Occupational History    Comment: retired  Scientific laboratory technician  . Financial resource strain: Not on file  . Food insecurity    Worry: Not on file    Inability: Not on file  . Transportation needs    Medical: Not on file    Non-medical: Not on file  Tobacco Use  . Smoking status: Current Every Day Smoker    Packs/day: 0.50    Years: 30.00    Pack years: 15.00    Types: Cigarettes  . Smokeless tobacco: Never Used  . Tobacco comment: Pt smokes 6 to 7 a day trying to quit  Substance and Sexual Activity  . Alcohol use: No    Alcohol/week: 3.0 standard drinks    Types: 3 Shots of liquor per week    Comment: quit in 10/2015   . Drug use: Yes    Types: Marijuana    Comment: occ  . Sexual activity: Not Currently  Lifestyle  . Physical activity    Days per week: Not on file    Minutes per session: Not on file  . Stress: Not on file  Relationships  . Social Herbalist on phone: Not on file    Gets together: Not on file    Attends religious service: Not on file    Active member of club or organization: Not on file    Attends meetings of clubs or organizations: Not on file    Relationship status: Not on file  . Intimate partner violence    Fear of current or ex partner: Not on file    Emotionally abused: Not on file    Physically abused: Not on file    Forced sexual activity: Not on file  Other Topics Concern  . Not on file  Social History Narrative  . Not on file    Family History: Family History  Problem Relation Age of Onset  . Heart attack Maternal Grandfather   . Colon cancer Neg Hx   . Breast cancer Neg Hx   . Prostate cancer Neg Hx      Review of Systems: All other systems reviewed and are otherwise negative except as noted above.  Physical Exam: Vitals:   03/15/19 1110  BP: 140/74  Pulse: 77  SpO2: 99%  Weight: 155 lb 6.4 oz (70.5 kg)  Height: 5\' 11"  (1.803 m)     GEN- The patient is well appearing, alert and oriented x 3 today.   HEENT:  normocephalic, atraumatic; sclera clear, conjunctiva pink; hearing intact; oropharynx clear; neck supple  Lungs- Clear to ausculation bilaterally, normal work of breathing.  No wheezes, rales, rhonchi Heart- Regular rate and rhythm, no murmurs, rubs or gallops  GI- soft, non-tender, non-distended, bowel sounds present  Extremities- no clubbing, cyanosis, or edema  MS- no significant deformity or atrophy Skin- warm and dry, no rash or lesion; ILR pocket well healed Psych- euthymic mood, full affect Neuro- strength and sensation are intact  ILR Interrogation- reviewed in detail today,  See PACEART report  EKG:  EKG is ordered today. The ekg ordered today shows NSR 77 bpm with PACs, personally reviewed.   Recent Labs: 08/03/2018: ALT 10; BUN 12; Creatinine, Ser 0.99; Hemoglobin 8.3; Platelets 423; Potassium 3.7; Sodium 145; TSH 0.444   Wt Readings from Last 3 Encounters:  03/15/19 155 lb 6.4 oz (70.5 kg)  01/19/19 140 lb (63.5 kg)  11/16/18 151 lb 12.8 oz (68.9 kg)     Other studies Reviewed: Additional studies/ records that were reviewed today include: Echo 04/2017 shows LVEF 30-35%, Previous EP office notes, Previous remote checks, Most recent labwork.   Assessment and Plan:  1.  Paroxysmal atrial fibrillation s/p Medtronic ILR Normal ILR function. Will likely reach EOS within the next several months See Pace Art report No changes today  2. HTN He has been out of some of his medications for a week (He's not sure which) and is low on others. Will give 1 month refill, and then can be resumed by PCP  3. Cardiac Clearance Pt at low risk of cardiac complication from procedure "Fiducial markers OAR" for Prostate CA. He is OK to hold Xarelto for 3 days leading up to procedure, should resume as soon as OK with performing provider.   Current medicines are reviewed at length with the patient today.   The patient does not have concerns regarding his medicines.  The following changes were  made today:  1 month refills, follow up with PCP for further.   Labs/ tests ordered today include:  Orders Placed This Encounter  Procedures  . CBC  . Basic metabolic panel  . CUP PACEART [place]  . EKG 12-Lead   Disposition:   Follow up with Dr. Lovena Le in 12 Months   Signed, Shirley Friar, PA-C  03/15/2019 2:18 PM  [facility] [address] [place] [address] [phone] (office) [phone] (fax)

## 2019-03-15 ENCOUNTER — Telehealth: Payer: Self-pay

## 2019-03-15 ENCOUNTER — Other Ambulatory Visit: Payer: Self-pay

## 2019-03-15 ENCOUNTER — Encounter: Payer: Self-pay | Admitting: Student

## 2019-03-15 ENCOUNTER — Ambulatory Visit (INDEPENDENT_AMBULATORY_CARE_PROVIDER_SITE_OTHER): Payer: Medicare Other | Admitting: Student

## 2019-03-15 VITALS — BP 140/74 | HR 77 | Ht 71.0 in | Wt 155.4 lb

## 2019-03-15 DIAGNOSIS — Z79899 Other long term (current) drug therapy: Secondary | ICD-10-CM

## 2019-03-15 DIAGNOSIS — I10 Essential (primary) hypertension: Secondary | ICD-10-CM

## 2019-03-15 DIAGNOSIS — E785 Hyperlipidemia, unspecified: Secondary | ICD-10-CM

## 2019-03-15 DIAGNOSIS — I480 Paroxysmal atrial fibrillation: Secondary | ICD-10-CM

## 2019-03-15 DIAGNOSIS — I255 Ischemic cardiomyopathy: Secondary | ICD-10-CM | POA: Diagnosis not present

## 2019-03-15 LAB — CUP PACEART INCLINIC DEVICE CHECK
Date Time Interrogation Session: 20201019125005
Implantable Pulse Generator Implant Date: 20170623

## 2019-03-15 MED ORDER — SPIRONOLACTONE 25 MG PO TABS: 25.0000 mg | ORAL_TABLET | Freq: Every day | ORAL | 0 refills | Status: DC

## 2019-03-15 MED ORDER — ATORVASTATIN CALCIUM 40 MG PO TABS: ORAL_TABLET | ORAL | 0 refills | Status: DC

## 2019-03-15 MED ORDER — AMLODIPINE BESYLATE 2.5 MG PO TABS: 2.5000 mg | ORAL_TABLET | Freq: Every day | ORAL | 0 refills | Status: DC

## 2019-03-15 MED ORDER — LISINOPRIL 40 MG PO TABS: 40.0000 mg | ORAL_TABLET | Freq: Every day | ORAL | 0 refills | Status: DC

## 2019-03-15 MED ORDER — APIXABAN 5 MG PO TABS: 5.0000 mg | ORAL_TABLET | Freq: Two times a day (BID) | ORAL | 0 refills | Status: DC

## 2019-03-15 MED ORDER — PANTOPRAZOLE SODIUM 40 MG PO TBEC: 40.0000 mg | DELAYED_RELEASE_TABLET | Freq: Two times a day (BID) | ORAL | 0 refills | Status: DC

## 2019-03-15 MED ORDER — POTASSIUM CHLORIDE CRYS ER 20 MEQ PO TBCR: 40.0000 meq | EXTENDED_RELEASE_TABLET | Freq: Every day | ORAL | 0 refills | Status: DC

## 2019-03-15 MED ORDER — METOPROLOL TARTRATE 25 MG PO TABS: 25.0000 mg | ORAL_TABLET | Freq: Two times a day (BID) | ORAL | 0 refills | Status: DC

## 2019-03-15 MED FILL — ELIQUIS 5 MG TABLET: 5 | 30 days supply | Qty: 60 | Fill #0

## 2019-03-15 MED FILL — AMLODIPINE BESYLATE 2.5 MG: 2.5 | 30 days supply | Qty: 30 | Fill #0

## 2019-03-15 MED FILL — ATORVASTATIN CALCIUM 40 MG: 40 | 30 days supply | Qty: 30 | Fill #0

## 2019-03-15 MED FILL — POTASSIUM CL ER 20 MEQ TAB: 20 | 30 days supply | Qty: 60 | Fill #0

## 2019-03-15 MED FILL — LISINOPRIL 40 MG TABLET: 40 | 30 days supply | Qty: 30 | Fill #0

## 2019-03-15 MED FILL — SPIRONOLACTONE 25 MG TABLET: 25 | 30 days supply | Qty: 30 | Fill #0

## 2019-03-15 MED FILL — METOPROLOL TARTRATE 25 MG T: 25 | 30 days supply | Qty: 60 | Fill #0

## 2019-03-15 MED FILL — PANTOPRAZOLE SOD DR 40 MG T: 40 | 30 days supply | Qty: 60 | Fill #0

## 2019-03-15 NOTE — Patient Instructions (Signed)
Medication Instructions:   Your physician recommends that you continue on your current medications as directed. Please refer to the Current Medication list given to you today.  *If you need a refill on your cardiac medications before your next appointment, please call your pharmacy*  Lab Work:  You will have labs drawn today: BMET and CBC  If you have labs (blood work) drawn today and your tests are completely normal, you will receive your results only by: Marland Kitchen MyChart Message (if you have MyChart) OR . A paper copy in the mail If you have any lab test that is abnormal or we need to change your treatment, we will call you to review the results.  Testing/Procedures:  None ordered today  Follow-Up: At [facility], you and your health needs are our priority.  As part of our continuing mission to provide you with exceptional heart care, we have created designated Provider Care Teams.  These Care Teams include your primary Cardiologist (physician) and Advanced Practice Providers (APPs -  Physician Assistants and Nurse Practitioners) who all work together to provide you with the care you need, when you need it.  Your next appointment:   12 months  The format for your next appointment:   In Person  Provider:   You may see Cristopher Peru, MD or one of the following Advanced Practice Providers on your designated Care Team:    Chanetta Marshall, NP  Tommye Standard, PA-C  Legrand Como "[place]" [place], [place]

## 2019-03-15 NOTE — Telephone Encounter (Signed)
RXs sent.

## 2019-03-16 LAB — CBC
Hematocrit: 34.7 % — ABNORMAL LOW (ref 37.5–51.0)
Hemoglobin: 10.8 g/dL — ABNORMAL LOW (ref 13.0–17.7)
MCH: 26.5 pg — ABNORMAL LOW (ref 26.6–33.0)
MCHC: 31.1 g/dL — ABNORMAL LOW (ref 31.5–35.7)
MCV: 85 fL (ref 79–97)
Platelets: 407 10*3/uL (ref 150–450)
RBC: 4.07 x10E6/uL — ABNORMAL LOW (ref 4.14–5.80)
RDW: 15 % (ref 11.6–15.4)
WBC: 6.1 10*3/uL (ref 3.4–10.8)

## 2019-03-16 LAB — BASIC METABOLIC PANEL
BUN/Creatinine Ratio: 16 (ref 10–24)
BUN: 13 mg/dL (ref 8–27)
CO2: 24 mmol/L (ref 20–29)
Calcium: 9.5 mg/dL (ref 8.6–10.2)
Chloride: 104 mmol/L (ref 96–106)
Creatinine, Ser: 0.82 mg/dL (ref 0.76–1.27)
GFR calc Af Amer: 107 mL/min/{1.73_m2} (ref >59)
GFR calc non Af Amer: 92 mL/min/{1.73_m2} (ref >59)
Glucose: 76 mg/dL (ref 65–99)
Potassium: 4.3 mmol/L (ref 3.5–5.2)
Sodium: 140 mmol/L (ref 134–144)

## 2019-03-17 ENCOUNTER — Ambulatory Visit (INDEPENDENT_AMBULATORY_CARE_PROVIDER_SITE_OTHER): Payer: Medicare Other | Admitting: *Deleted

## 2019-03-17 DIAGNOSIS — I480 Paroxysmal atrial fibrillation: Secondary | ICD-10-CM | POA: Diagnosis not present

## 2019-03-17 MED FILL — SULFAMETHOXAZOLE-TMP DS TAB: 800-160 | 2 days supply | Qty: 2 | Fill #0

## 2019-03-18 LAB — CUP PACEART REMOTE DEVICE CHECK
Date Time Interrogation Session: 20201021184001
Implantable Pulse Generator Implant Date: 20170623

## 2019-03-31 MED FILL — SULFAMETHOXAZOLE-TMP DS TAB: 800-160 | 2 days supply | Qty: 2 | Fill #0

## 2019-03-31 NOTE — Progress Notes (Signed)
Carelink Summary Report / Loop Recorder 

## 2019-04-02 ENCOUNTER — Telehealth: Payer: Self-pay | Admitting: Emergency Medicine

## 2019-04-02 NOTE — Telephone Encounter (Signed)
[name] contacted office after receiving message that we called patient. She is listed on DPR. She gives him messages when calls come in and will be able to give him message to call office after lunch sometime.

## 2019-04-02 NOTE — Telephone Encounter (Signed)
LMOM Called to assess if symptomatic at 1943 on 03/31/19 with a 3 second pause that was recorded by his LINQ.

## 2019-04-02 NOTE — Telephone Encounter (Signed)
The pt returned [place] phone call. I told him per Jenny Reichmann phone note that on 03-31-2019 at 19:48 he had symptom of 3 second pause. I asked the pt do he remember how he felt that day and time? Pt states he felt fine that day. I gave him my direct office number and told him he could call Monday from 8:30 am- 5pm. The pt verbalized understanding.

## 2019-04-05 DIAGNOSIS — H401222 Low-tension glaucoma, left eye, moderate stage: Secondary | ICD-10-CM | POA: Diagnosis not present

## 2019-04-05 DIAGNOSIS — H401213 Low-tension glaucoma, right eye, severe stage: Secondary | ICD-10-CM | POA: Diagnosis not present

## 2019-04-05 DIAGNOSIS — H53461 Homonymous bilateral field defects, right side: Secondary | ICD-10-CM | POA: Diagnosis not present

## 2019-04-05 MED FILL — LATANOPROST 0.005% EYE DRP: 0.005 | 56 days supply | Qty: 8 | Fill #0

## 2019-04-05 NOTE — Telephone Encounter (Signed)
Patient confirmed he was unaware of pause recorded by his loop recorder on 03/31/2019. Will continue to monitor alerts.

## 2019-04-07 ENCOUNTER — Other Ambulatory Visit (HOSPITAL_COMMUNITY): Payer: Self-pay

## 2019-04-07 NOTE — Telephone Encounter (Signed)
Opened in Error.

## 2019-04-13 ENCOUNTER — Ambulatory Visit: Payer: Medicare Other | Admitting: Radiation Oncology

## 2019-04-13 ENCOUNTER — Ambulatory Visit (HOSPITAL_COMMUNITY): Payer: Medicare Other

## 2019-04-16 ENCOUNTER — Telehealth: Payer: Self-pay | Admitting: Emergency Medicine

## 2019-04-16 MED FILL — SULFAMETHOXAZOLE-TMP DS TAB: 800-160 | 1 days supply | Qty: 1 | Fill #0

## 2019-04-16 NOTE — Telephone Encounter (Signed)
LM to call clinic. Need to assess for symptoms. Patient had event recorded 04/13/19 at 0841 that was 9 seconds long and showed period of time with Hr in 20's. History of asymptomatic pauses in the past.

## 2019-04-16 NOTE — Telephone Encounter (Signed)
Patient was asymptomatic at time of the event.

## 2019-04-19 ENCOUNTER — Ambulatory Visit (INDEPENDENT_AMBULATORY_CARE_PROVIDER_SITE_OTHER): Payer: Medicare Other | Admitting: *Deleted

## 2019-04-19 DIAGNOSIS — I63331 Cerebral infarction due to thrombosis of right posterior cerebral artery: Secondary | ICD-10-CM

## 2019-04-20 LAB — CUP PACEART REMOTE DEVICE CHECK
Date Time Interrogation Session: 20201124070031
Implantable Pulse Generator Implant Date: 20170623

## 2019-04-26 ENCOUNTER — Encounter: Payer: Self-pay | Admitting: *Deleted

## 2019-05-03 ENCOUNTER — Telehealth: Payer: Self-pay | Admitting: *Deleted

## 2019-05-03 NOTE — Telephone Encounter (Signed)
CALLED PATIENT TO INFORM THAT HE DOESN'T NEED TO COME ON 05/04/19 DUE TO MRI NOT BEING UNTIL 05-13-19, SIM APPT. RESCHEDULED FOR 05-14-19, PATIENT'S SISTER- [name] VERIFIED UNDERSTANDING THIS

## 2019-05-03 NOTE — Telephone Encounter (Signed)
CALLED PATIENT TO REMIND OF SIM APPT. FOR 05-04-19 - ARRIVAL TIME- 1:15 PM @ [facility], I ALSO LET THEM KNOW THAT HE MAY GET A CALL REGARDING SETTING UP HIS MRI, DUE TO THE FACT THAT THIS PATIENT HAS A LOOP RECORDER, SPOKE WITH PATIENT'S SISTER- [name] AND SHE IS AWARE OF THIS INFO.

## 2019-05-04 ENCOUNTER — Ambulatory Visit: Payer: Medicare Other | Admitting: Radiation Oncology

## 2019-05-13 ENCOUNTER — Ambulatory Visit (HOSPITAL_COMMUNITY): Payer: Medicare Other

## 2019-05-13 ENCOUNTER — Telehealth: Payer: Self-pay | Admitting: *Deleted

## 2019-05-13 NOTE — Telephone Encounter (Signed)
Called patient to remind of MRI for 05-13-19 - arrival time- 3:30 pm @ [facility] and his sim on 05-14-19 - arrival time- 1:45 pm @ [facility], spoke with patient's sister- Marliss Coots and she is aware of these appts.

## 2019-05-14 ENCOUNTER — Telehealth: Payer: Self-pay | Admitting: *Deleted

## 2019-05-14 ENCOUNTER — Ambulatory Visit: Payer: Medicare Other | Admitting: Radiation Oncology

## 2019-05-14 NOTE — Telephone Encounter (Signed)
Called patient to inform of MRI for 05-31-19- arrival time- 4:30 pm @ [facility], and his sim on 06-01-19 - arrival time- 1:45 pm @ [facility], spoke with patient's sister- Threasa Heads and she is ware of these appts.

## 2019-05-16 NOTE — Addendum Note (Signed)
Addended by: Patsey Berthold on: 05/16/2019 08:48 PM   Modules accepted: Level of Service

## 2019-05-20 ENCOUNTER — Ambulatory Visit (INDEPENDENT_AMBULATORY_CARE_PROVIDER_SITE_OTHER): Payer: Medicare Other | Admitting: *Deleted

## 2019-05-20 DIAGNOSIS — I63331 Cerebral infarction due to thrombosis of right posterior cerebral artery: Secondary | ICD-10-CM | POA: Diagnosis not present

## 2019-05-20 LAB — CUP PACEART REMOTE DEVICE CHECK
Date Time Interrogation Session: 20201224084501
Implantable Pulse Generator Implant Date: 20170623

## 2019-05-23 NOTE — Progress Notes (Signed)
ILR remote 

## 2019-05-26 ENCOUNTER — Ambulatory Visit (HOSPITAL_COMMUNITY): Payer: Medicare Other

## 2019-05-31 ENCOUNTER — Telehealth: Payer: Self-pay | Admitting: *Deleted

## 2019-05-31 ENCOUNTER — Ambulatory Visit (HOSPITAL_COMMUNITY)
Admission: RE | Admit: 2019-05-31 | Discharge: 2019-05-31 | Disposition: A | Discharge status: Home / Self Care | Payer: Medicare Other | Source: Ambulatory Visit | Attending: Urology | Admitting: Urology

## 2019-05-31 ENCOUNTER — Other Ambulatory Visit: Payer: Self-pay

## 2019-05-31 DIAGNOSIS — C61 Malignant neoplasm of prostate: Secondary | ICD-10-CM | POA: Diagnosis not present

## 2019-05-31 NOTE — Telephone Encounter (Signed)
CALLED PATIENT'S SISTER KARLENE JACKSON TO REMIND OF PATIENT'S MRI FOR 05-31-19 AND HIS SIM FOR 06-01-19, PATIENT'S SISTER KARLENE JACKSON VERIFIED UNDERSTANDING THESE APPTS.

## 2019-06-01 ENCOUNTER — Other Ambulatory Visit: Payer: Self-pay

## 2019-06-01 ENCOUNTER — Ambulatory Visit
Admission: RE | Admit: 2019-06-01 | Discharge: 2019-06-01 | Disposition: A | Discharge status: Home / Self Care | Payer: Medicare Other | Source: Ambulatory Visit | Attending: Radiation Oncology | Admitting: Radiation Oncology

## 2019-06-01 DIAGNOSIS — C61 Malignant neoplasm of prostate: Secondary | ICD-10-CM

## 2019-06-01 NOTE — Progress Notes (Signed)
  Radiation Oncology         [phone] ________________________________  Name: Gregory Perry MRN: [id number]  Date: 06/01/2019  DOB: 1951-05-17  SIMULATION AND TREATMENT PLANNING NOTE    ICD-10-CM   1. Malignant neoplasm of prostate ([place])  C61     DIAGNOSIS:  68 y.o. gentleman with Stage T1c adenocarcinoma of the prostate with Gleason score of 4+3, and PSA of 10.3.  NARRATIVE:  The patient was brought to the [facility].  Identity was confirmed.  All relevant records and images related to the planned course of therapy were reviewed.  The patient freely provided informed written consent to proceed with treatment after reviewing the details related to the planned course of therapy. The consent form was witnessed and verified by the simulation staff.  Then, the patient was set-up in a stable reproducible supine position for radiation therapy.  A vacuum lock pillow device was custom fabricated to position his legs in a reproducible immobilized position.  Then, I performed a urethrogram under sterile conditions to identify the prostatic apex.  CT images were obtained.  Surface markings were placed.  The CT images were loaded into the planning software.  Then the prostate target and avoidance structures including the rectum, bladder, bowel and hips were contoured.  Treatment planning then occurred.  The radiation prescription was entered and confirmed.  A total of one complex treatment devices was fabricated. I have requested : Intensity Modulated Radiotherapy (IMRT) is medically necessary for this case for the following reason:  Rectal sparing.Marland Kitchen  PLAN:  The patient will receive 70 Gy in 28 fractions.  ________________________________  Sheral Apley Tammi Klippel, M.D.

## 2019-06-03 ENCOUNTER — Encounter: Payer: Self-pay | Admitting: *Deleted

## 2019-06-03 NOTE — Progress Notes (Signed)
[facility] Work  Clinical Social Work was referred by radiation oncology for assessment of psychosocial needs.  Clinical Social Worker left voicemail for patient and called patietn's sister, [name], to offer support and assess for needs.  CSW discussed possibility of bedbugs in patient's home and ability to get extermination treatment.  Patient's sister said she'd "try her best to get it taken care of".  CSW will await follow up call from patient.   Gwinda Maine, LCSW  Clinical Social Worker [facility]

## 2019-06-10 ENCOUNTER — Ambulatory Visit: Payer: Medicare Other

## 2019-06-11 ENCOUNTER — Ambulatory Visit: Payer: Medicare Other

## 2019-06-14 ENCOUNTER — Ambulatory Visit: Payer: Medicare Other

## 2019-06-15 ENCOUNTER — Ambulatory Visit: Payer: Medicare Other | Admitting: Radiation Oncology

## 2019-06-15 ENCOUNTER — Ambulatory Visit: Payer: Medicare Other

## 2019-06-16 ENCOUNTER — Ambulatory Visit: Payer: Medicare Other

## 2019-06-17 ENCOUNTER — Ambulatory Visit: Payer: Medicare Other

## 2019-06-17 ENCOUNTER — Telehealth: Payer: Self-pay | Admitting: Medical Oncology

## 2019-06-17 NOTE — Telephone Encounter (Signed)
Called patient and spoke with his sister to inquire about rescheduling CT simulation. CT reached out to him yesterday to reschedule but he told them he was thinking about cancelling treatment. He does not have a phone but his sister plans to go over to see him tomorrow and they can call me to further discuss.

## 2019-06-18 ENCOUNTER — Ambulatory Visit: Payer: Medicare Other

## 2019-06-21 ENCOUNTER — Ambulatory Visit: Payer: Medicare Other

## 2019-06-21 ENCOUNTER — Ambulatory Visit (INDEPENDENT_AMBULATORY_CARE_PROVIDER_SITE_OTHER): Payer: Medicare Other | Admitting: *Deleted

## 2019-06-21 DIAGNOSIS — I63331 Cerebral infarction due to thrombosis of right posterior cerebral artery: Secondary | ICD-10-CM

## 2019-06-21 LAB — CUP PACEART REMOTE DEVICE CHECK
Date Time Interrogation Session: 20210124230935
Implantable Pulse Generator Implant Date: 20170623

## 2019-06-22 ENCOUNTER — Ambulatory Visit: Payer: Medicare Other

## 2019-06-23 ENCOUNTER — Ambulatory Visit: Payer: Medicare Other

## 2019-06-24 ENCOUNTER — Telehealth: Payer: Self-pay | Admitting: Medical Oncology

## 2019-06-24 ENCOUNTER — Ambulatory Visit: Payer: Medicare Other

## 2019-06-24 NOTE — Telephone Encounter (Signed)
Left message requesting a return call to discuss rescheduling CT simulation appointment for radiation treatments.

## 2019-06-25 ENCOUNTER — Ambulatory Visit: Payer: Medicare Other

## 2019-06-28 ENCOUNTER — Ambulatory Visit: Payer: Medicare Other

## 2019-06-28 ENCOUNTER — Telehealth: Payer: Self-pay

## 2019-06-28 ENCOUNTER — Telehealth: Payer: Self-pay | Admitting: Medical Oncology

## 2019-06-28 NOTE — Telephone Encounter (Signed)
Left message requesting a return call to discuss rescheduling CT simulation appointment.

## 2019-06-28 NOTE — Telephone Encounter (Signed)
DPR on record, ok to speak to pt sister "Vaughan Basta", Spoke with pt sister Marliss Coots who confirmed pt identity.  Advised ILR battery has reached RRT.  No indication that ongoing monitoring is needed.  She will have pt unplug home monitor and send back when return kit arrives.  She will talk with pt about if he wants device removed or not.  Advised will need to schedule appt to remove device if pt wishes to have removed.

## 2019-06-29 ENCOUNTER — Ambulatory Visit: Payer: Medicare Other

## 2019-06-30 ENCOUNTER — Ambulatory Visit: Payer: Medicare Other

## 2019-07-01 ENCOUNTER — Ambulatory Visit: Payer: Medicare Other

## 2019-07-02 ENCOUNTER — Ambulatory Visit: Payer: Medicare Other

## 2019-07-05 ENCOUNTER — Ambulatory Visit: Payer: Medicare Other

## 2019-07-06 ENCOUNTER — Ambulatory Visit: Payer: Medicare Other

## 2019-07-07 ENCOUNTER — Ambulatory Visit: Payer: Medicare Other

## 2019-07-08 ENCOUNTER — Ambulatory Visit: Payer: Medicare Other

## 2019-07-09 ENCOUNTER — Ambulatory Visit: Payer: Medicare Other

## 2019-07-12 ENCOUNTER — Telehealth: Payer: Self-pay | Admitting: Medical Oncology

## 2019-07-12 ENCOUNTER — Ambulatory Visit: Payer: Medicare Other

## 2019-07-12 NOTE — Telephone Encounter (Signed)
Called patient's sister Marliss Coots, to follow up on rescheduling CT simulation. She states she talked with her brother and he has decided he is not coming back. I will forward to [place].

## 2019-07-13 ENCOUNTER — Ambulatory Visit: Payer: Medicare Other

## 2019-07-14 ENCOUNTER — Ambulatory Visit: Payer: Medicare Other

## 2019-07-15 ENCOUNTER — Ambulatory Visit: Payer: Medicare Other

## 2019-07-16 ENCOUNTER — Ambulatory Visit: Payer: Medicare Other

## 2019-07-19 ENCOUNTER — Ambulatory Visit: Payer: Medicare Other

## 2019-07-19 DIAGNOSIS — Z961 Presence of intraocular lens: Secondary | ICD-10-CM | POA: Diagnosis not present

## 2019-07-19 DIAGNOSIS — H401222 Low-tension glaucoma, left eye, moderate stage: Secondary | ICD-10-CM | POA: Diagnosis not present

## 2019-07-19 DIAGNOSIS — H401213 Low-tension glaucoma, right eye, severe stage: Secondary | ICD-10-CM | POA: Diagnosis not present

## 2019-07-19 DIAGNOSIS — H26493 Other secondary cataract, bilateral: Secondary | ICD-10-CM | POA: Diagnosis not present

## 2019-09-25 ENCOUNTER — Inpatient Hospital Stay (HOSPITAL_COMMUNITY)
Admission: EM | Admit: 2019-09-25 | Discharge: 2019-09-28 | DRG: 024 | Disposition: A | Discharge status: Home Health | Payer: Medicare Other | Attending: Neurology | Admitting: Neurology

## 2019-09-25 ENCOUNTER — Other Ambulatory Visit: Payer: Self-pay

## 2019-09-25 DIAGNOSIS — I6601 Occlusion and stenosis of right middle cerebral artery: Secondary | ICD-10-CM | POA: Diagnosis not present

## 2019-09-25 DIAGNOSIS — R0689 Other abnormalities of breathing: Secondary | ICD-10-CM | POA: Diagnosis not present

## 2019-09-25 DIAGNOSIS — T45516A Underdosing of anticoagulants, initial encounter: Secondary | ICD-10-CM | POA: Diagnosis present

## 2019-09-25 DIAGNOSIS — Z720 Tobacco use: Secondary | ICD-10-CM | POA: Diagnosis present

## 2019-09-25 DIAGNOSIS — E559 Vitamin D deficiency, unspecified: Secondary | ICD-10-CM

## 2019-09-25 DIAGNOSIS — Z7901 Long term (current) use of anticoagulants: Secondary | ICD-10-CM

## 2019-09-25 DIAGNOSIS — Z20822 Contact with and (suspected) exposure to covid-19: Secondary | ICD-10-CM | POA: Diagnosis present

## 2019-09-25 DIAGNOSIS — T447X6A Underdosing of beta-adrenoreceptor antagonists, initial encounter: Secondary | ICD-10-CM | POA: Diagnosis present

## 2019-09-25 DIAGNOSIS — I255 Ischemic cardiomyopathy: Secondary | ICD-10-CM | POA: Diagnosis present

## 2019-09-25 DIAGNOSIS — Z79899 Other long term (current) drug therapy: Secondary | ICD-10-CM

## 2019-09-25 DIAGNOSIS — G8194 Hemiplegia, unspecified affecting left nondominant side: Secondary | ICD-10-CM | POA: Diagnosis present

## 2019-09-25 DIAGNOSIS — Z91128 Patient's intentional underdosing of medication regimen for other reason: Secondary | ICD-10-CM

## 2019-09-25 DIAGNOSIS — I639 Cerebral infarction, unspecified: Secondary | ICD-10-CM | POA: Diagnosis not present

## 2019-09-25 DIAGNOSIS — Z8711 Personal history of peptic ulcer disease: Secondary | ICD-10-CM

## 2019-09-25 DIAGNOSIS — Z8249 Family history of ischemic heart disease and other diseases of the circulatory system: Secondary | ICD-10-CM

## 2019-09-25 DIAGNOSIS — I63411 Cerebral infarction due to embolism of right middle cerebral artery: Principal | ICD-10-CM | POA: Diagnosis present

## 2019-09-25 DIAGNOSIS — I110 Hypertensive heart disease with heart failure: Secondary | ICD-10-CM | POA: Diagnosis present

## 2019-09-25 DIAGNOSIS — E785 Hyperlipidemia, unspecified: Secondary | ICD-10-CM | POA: Diagnosis present

## 2019-09-25 DIAGNOSIS — I5022 Chronic systolic (congestive) heart failure: Secondary | ICD-10-CM | POA: Diagnosis present

## 2019-09-25 DIAGNOSIS — R29708 NIHSS score 8: Secondary | ICD-10-CM | POA: Diagnosis present

## 2019-09-25 DIAGNOSIS — I7389 Other specified peripheral vascular diseases: Secondary | ICD-10-CM | POA: Diagnosis present

## 2019-09-25 DIAGNOSIS — Z9114 Patient's other noncompliance with medication regimen: Secondary | ICD-10-CM

## 2019-09-25 DIAGNOSIS — D62 Acute posthemorrhagic anemia: Secondary | ICD-10-CM | POA: Diagnosis not present

## 2019-09-25 DIAGNOSIS — I160 Hypertensive urgency: Secondary | ICD-10-CM | POA: Diagnosis present

## 2019-09-25 DIAGNOSIS — I10 Essential (primary) hypertension: Secondary | ICD-10-CM | POA: Diagnosis present

## 2019-09-25 DIAGNOSIS — F17210 Nicotine dependence, cigarettes, uncomplicated: Secondary | ICD-10-CM | POA: Diagnosis present

## 2019-09-25 DIAGNOSIS — Z23 Encounter for immunization: Secondary | ICD-10-CM

## 2019-09-25 DIAGNOSIS — K219 Gastro-esophageal reflux disease without esophagitis: Secondary | ICD-10-CM | POA: Diagnosis present

## 2019-09-25 DIAGNOSIS — Z8546 Personal history of malignant neoplasm of prostate: Secondary | ICD-10-CM

## 2019-09-25 DIAGNOSIS — F1210 Cannabis abuse, uncomplicated: Secondary | ICD-10-CM | POA: Diagnosis present

## 2019-09-25 DIAGNOSIS — Z8673 Personal history of transient ischemic attack (TIA), and cerebral infarction without residual deficits: Secondary | ICD-10-CM

## 2019-09-25 DIAGNOSIS — D509 Iron deficiency anemia, unspecified: Secondary | ICD-10-CM | POA: Diagnosis present

## 2019-09-25 DIAGNOSIS — I63511 Cerebral infarction due to unspecified occlusion or stenosis of right middle cerebral artery: Secondary | ICD-10-CM

## 2019-09-25 DIAGNOSIS — Y92009 Unspecified place in unspecified non-institutional (private) residence as the place of occurrence of the external cause: Secondary | ICD-10-CM

## 2019-09-25 DIAGNOSIS — T464X6A Underdosing of angiotensin-converting-enzyme inhibitors, initial encounter: Secondary | ICD-10-CM | POA: Diagnosis present

## 2019-09-25 DIAGNOSIS — R29818 Other symptoms and signs involving the nervous system: Secondary | ICD-10-CM | POA: Diagnosis not present

## 2019-09-25 DIAGNOSIS — I480 Paroxysmal atrial fibrillation: Secondary | ICD-10-CM | POA: Diagnosis not present

## 2019-09-25 DIAGNOSIS — H5347 Heteronymous bilateral field defects: Secondary | ICD-10-CM | POA: Diagnosis present

## 2019-09-25 HISTORY — DX: Cerebral infarction, unspecified: I63.9

## 2019-09-25 HISTORY — DX: Vitamin D deficiency, unspecified: E55.9

## 2019-09-26 ENCOUNTER — Inpatient Hospital Stay (HOSPITAL_COMMUNITY): Payer: Medicare Other | Admitting: Anesthesiology

## 2019-09-26 ENCOUNTER — Other Ambulatory Visit: Payer: Self-pay

## 2019-09-26 ENCOUNTER — Inpatient Hospital Stay (HOSPITAL_COMMUNITY): Payer: Medicare Other

## 2019-09-26 ENCOUNTER — Emergency Department (HOSPITAL_COMMUNITY): Payer: Medicare Other

## 2019-09-26 ENCOUNTER — Encounter (HOSPITAL_COMMUNITY)
Admission: EM | Disposition: A | Discharge status: Home Health | Payer: Self-pay | Source: Home / Self Care | Attending: Neurology

## 2019-09-26 ENCOUNTER — Encounter (HOSPITAL_COMMUNITY): Payer: Self-pay | Admitting: Emergency Medicine

## 2019-09-26 DIAGNOSIS — T45516A Underdosing of anticoagulants, initial encounter: Secondary | ICD-10-CM | POA: Diagnosis present

## 2019-09-26 DIAGNOSIS — I63511 Cerebral infarction due to unspecified occlusion or stenosis of right middle cerebral artery: Secondary | ICD-10-CM

## 2019-09-26 DIAGNOSIS — I63411 Cerebral infarction due to embolism of right middle cerebral artery: Secondary | ICD-10-CM | POA: Diagnosis not present

## 2019-09-26 DIAGNOSIS — D62 Acute posthemorrhagic anemia: Secondary | ICD-10-CM | POA: Diagnosis not present

## 2019-09-26 DIAGNOSIS — I255 Ischemic cardiomyopathy: Secondary | ICD-10-CM | POA: Diagnosis not present

## 2019-09-26 DIAGNOSIS — Z9114 Patient's other noncompliance with medication regimen: Secondary | ICD-10-CM | POA: Diagnosis not present

## 2019-09-26 DIAGNOSIS — E7800 Pure hypercholesterolemia, unspecified: Secondary | ICD-10-CM

## 2019-09-26 DIAGNOSIS — H5347 Heteronymous bilateral field defects: Secondary | ICD-10-CM | POA: Diagnosis not present

## 2019-09-26 DIAGNOSIS — R29818 Other symptoms and signs involving the nervous system: Secondary | ICD-10-CM | POA: Diagnosis not present

## 2019-09-26 DIAGNOSIS — I639 Cerebral infarction, unspecified: Secondary | ICD-10-CM | POA: Diagnosis not present

## 2019-09-26 DIAGNOSIS — E785 Hyperlipidemia, unspecified: Secondary | ICD-10-CM | POA: Diagnosis not present

## 2019-09-26 DIAGNOSIS — I161 Hypertensive emergency: Secondary | ICD-10-CM

## 2019-09-26 DIAGNOSIS — Z91128 Patient's intentional underdosing of medication regimen for other reason: Secondary | ICD-10-CM | POA: Diagnosis not present

## 2019-09-26 DIAGNOSIS — G8194 Hemiplegia, unspecified affecting left nondominant side: Secondary | ICD-10-CM | POA: Diagnosis not present

## 2019-09-26 DIAGNOSIS — F17210 Nicotine dependence, cigarettes, uncomplicated: Secondary | ICD-10-CM | POA: Diagnosis present

## 2019-09-26 DIAGNOSIS — T447X6A Underdosing of beta-adrenoreceptor antagonists, initial encounter: Secondary | ICD-10-CM | POA: Diagnosis present

## 2019-09-26 DIAGNOSIS — I63311 Cerebral infarction due to thrombosis of right middle cerebral artery: Secondary | ICD-10-CM | POA: Diagnosis not present

## 2019-09-26 DIAGNOSIS — Z20822 Contact with and (suspected) exposure to covid-19: Secondary | ICD-10-CM | POA: Diagnosis not present

## 2019-09-26 DIAGNOSIS — I110 Hypertensive heart disease with heart failure: Secondary | ICD-10-CM | POA: Diagnosis not present

## 2019-09-26 DIAGNOSIS — K219 Gastro-esophageal reflux disease without esophagitis: Secondary | ICD-10-CM | POA: Diagnosis not present

## 2019-09-26 DIAGNOSIS — J9811 Atelectasis: Secondary | ICD-10-CM | POA: Diagnosis not present

## 2019-09-26 DIAGNOSIS — I480 Paroxysmal atrial fibrillation: Secondary | ICD-10-CM

## 2019-09-26 DIAGNOSIS — F1210 Cannabis abuse, uncomplicated: Secondary | ICD-10-CM

## 2019-09-26 DIAGNOSIS — R0689 Other abnormalities of breathing: Secondary | ICD-10-CM | POA: Diagnosis not present

## 2019-09-26 DIAGNOSIS — I10 Essential (primary) hypertension: Secondary | ICD-10-CM | POA: Diagnosis not present

## 2019-09-26 DIAGNOSIS — D509 Iron deficiency anemia, unspecified: Secondary | ICD-10-CM | POA: Diagnosis not present

## 2019-09-26 DIAGNOSIS — I7389 Other specified peripheral vascular diseases: Secondary | ICD-10-CM | POA: Diagnosis present

## 2019-09-26 DIAGNOSIS — Y92009 Unspecified place in unspecified non-institutional (private) residence as the place of occurrence of the external cause: Secondary | ICD-10-CM | POA: Diagnosis not present

## 2019-09-26 DIAGNOSIS — I6601 Occlusion and stenosis of right middle cerebral artery: Secondary | ICD-10-CM | POA: Diagnosis not present

## 2019-09-26 DIAGNOSIS — T464X6A Underdosing of angiotensin-converting-enzyme inhibitors, initial encounter: Secondary | ICD-10-CM | POA: Diagnosis present

## 2019-09-26 DIAGNOSIS — Z8673 Personal history of transient ischemic attack (TIA), and cerebral infarction without residual deficits: Secondary | ICD-10-CM

## 2019-09-26 DIAGNOSIS — I6389 Other cerebral infarction: Secondary | ICD-10-CM | POA: Diagnosis not present

## 2019-09-26 DIAGNOSIS — R29708 NIHSS score 8: Secondary | ICD-10-CM | POA: Diagnosis not present

## 2019-09-26 DIAGNOSIS — I160 Hypertensive urgency: Secondary | ICD-10-CM | POA: Diagnosis not present

## 2019-09-26 DIAGNOSIS — F17200 Nicotine dependence, unspecified, uncomplicated: Secondary | ICD-10-CM

## 2019-09-26 DIAGNOSIS — Z23 Encounter for immunization: Secondary | ICD-10-CM | POA: Diagnosis not present

## 2019-09-26 DIAGNOSIS — I5022 Chronic systolic (congestive) heart failure: Secondary | ICD-10-CM | POA: Diagnosis not present

## 2019-09-26 HISTORY — PX: IR CT HEAD LTD: IMG2386

## 2019-09-26 HISTORY — PX: RADIOLOGY WITH ANESTHESIA: SHX6223

## 2019-09-26 HISTORY — PX: IR US GUIDE VASC ACCESS RIGHT: IMG2390

## 2019-09-26 HISTORY — PX: IR PERCUTANEOUS ART THROMBECTOMY/INFUSION INTRACRANIAL INC DIAG ANGIO: IMG6087

## 2019-09-26 LAB — RAPID URINE DRUG SCREEN, HOSP PERFORMED
Amphetamines: NOT DETECTED
Barbiturates: NOT DETECTED
Benzodiazepines: NOT DETECTED
Cocaine: NOT DETECTED
Opiates: NOT DETECTED
Tetrahydrocannabinol: POSITIVE — AB

## 2019-09-26 LAB — HEMOGLOBIN A1C
Hgb A1c MFr Bld: 5.5 % (ref 4.8–5.6)
Mean Plasma Glucose: 111.15 mg/dL

## 2019-09-26 LAB — POCT I-STAT 7, (LYTES, BLD GAS, ICA,H+H)
Acid-Base Excess: 0 mmol/L (ref 0.0–2.0)
Bicarbonate: 26.8 mmol/L (ref 20.0–28.0)
Calcium, Ion: 1.22 mmol/L (ref 1.15–1.40)
HCT: 33 % — ABNORMAL LOW (ref 39.0–52.0)
Hemoglobin: 11.2 g/dL — ABNORMAL LOW (ref 13.0–17.0)
O2 Saturation: 100 %
Patient temperature: 94.5
Potassium: 4 mmol/L (ref 3.5–5.1)
Sodium: 142 mmol/L (ref 135–145)
TCO2: 28 mmol/L (ref 22–32)
pCO2 arterial: 48.4 mmHg — ABNORMAL HIGH (ref 32.0–48.0)
pH, Arterial: 7.34 — ABNORMAL LOW (ref 7.350–7.450)
pO2, Arterial: 274 mmHg — ABNORMAL HIGH (ref 83.0–108.0)

## 2019-09-26 LAB — TROPONIN I (HIGH SENSITIVITY)
Troponin I (High Sensitivity): 19 ng/L — ABNORMAL HIGH (ref <18)
Troponin I (High Sensitivity): 17 ng/L (ref <18)

## 2019-09-26 LAB — URINALYSIS, ROUTINE W REFLEX MICROSCOPIC
Bilirubin Urine: NEGATIVE
Glucose, UA: NEGATIVE mg/dL
Hgb urine dipstick: NEGATIVE
Ketones, ur: NEGATIVE mg/dL
Leukocytes,Ua: NEGATIVE
Nitrite: NEGATIVE
Protein, ur: NEGATIVE mg/dL
Specific Gravity, Urine: 1.01 (ref 1.005–1.030)
pH: 6 (ref 5.0–8.0)

## 2019-09-26 LAB — CBC
HCT: 36.6 % — ABNORMAL LOW (ref 39.0–52.0)
Hemoglobin: 10.9 g/dL — ABNORMAL LOW (ref 13.0–17.0)
MCH: 23.1 pg — ABNORMAL LOW (ref 26.0–34.0)
MCHC: 29.8 g/dL — ABNORMAL LOW (ref 30.0–36.0)
MCV: 77.7 fL — ABNORMAL LOW (ref 80.0–100.0)
Platelets: 343 10*3/uL (ref 150–400)
RBC: 4.71 MIL/uL (ref 4.22–5.81)
RDW: 20.8 % — ABNORMAL HIGH (ref 11.5–15.5)
WBC: 6.8 10*3/uL (ref 4.0–10.5)
nRBC: 0 % (ref 0.0–0.2)

## 2019-09-26 LAB — I-STAT CHEM 8, ED
BUN: 14 mg/dL (ref 8–23)
Calcium, Ion: 1.27 mmol/L (ref 1.15–1.40)
Chloride: 103 mmol/L (ref 98–111)
Creatinine, Ser: 0.9 mg/dL (ref 0.61–1.24)
Glucose, Bld: 93 mg/dL (ref 70–99)
HCT: 37 % — ABNORMAL LOW (ref 39.0–52.0)
Hemoglobin: 12.6 g/dL — ABNORMAL LOW (ref 13.0–17.0)
Potassium: 3.6 mmol/L (ref 3.5–5.1)
Sodium: 142 mmol/L (ref 135–145)
TCO2: 29 mmol/L (ref 22–32)

## 2019-09-26 LAB — BASIC METABOLIC PANEL
Anion gap: 10 (ref 5–15)
BUN: 13 mg/dL (ref 8–23)
CO2: 25 mmol/L (ref 22–32)
Calcium: 9.6 mg/dL (ref 8.9–10.3)
Chloride: 104 mmol/L (ref 98–111)
Creatinine, Ser: 0.92 mg/dL (ref 0.61–1.24)
GFR calc Af Amer: >60 mL/min (ref >60)
GFR calc non Af Amer: >60 mL/min (ref >60)
Glucose, Bld: 100 mg/dL — ABNORMAL HIGH (ref 70–99)
Potassium: 3.4 mmol/L — ABNORMAL LOW (ref 3.5–5.1)
Sodium: 139 mmol/L (ref 135–145)

## 2019-09-26 LAB — LIPID PANEL
Cholesterol: 192 mg/dL (ref 0–200)
HDL: 54 mg/dL (ref >40)
LDL Cholesterol: 127 mg/dL — ABNORMAL HIGH (ref 0–99)
Total CHOL/HDL Ratio: 3.6 RATIO
Triglycerides: 53 mg/dL (ref <150)
VLDL: 11 mg/dL (ref 0–40)

## 2019-09-26 LAB — GLUCOSE, CAPILLARY
Glucose-Capillary: 81 mg/dL (ref 70–99)
Glucose-Capillary: 84 mg/dL (ref 70–99)
Glucose-Capillary: 96 mg/dL (ref 70–99)
Glucose-Capillary: 98 mg/dL (ref 70–99)
Glucose-Capillary: 94 mg/dL (ref 70–99)

## 2019-09-26 LAB — RESPIRATORY PANEL BY RT PCR (FLU A&B, COVID)
Influenza A by PCR: NEGATIVE
Influenza B by PCR: NEGATIVE
SARS Coronavirus 2 by RT PCR: NEGATIVE

## 2019-09-26 LAB — TRIGLYCERIDES: Triglycerides: 52 mg/dL (ref <150)

## 2019-09-26 LAB — CBG MONITORING, ED: Glucose-Capillary: 90 mg/dL (ref 70–99)

## 2019-09-26 LAB — HIV ANTIBODY (ROUTINE TESTING W REFLEX): HIV Screen 4th Generation wRfx: NONREACTIVE

## 2019-09-26 LAB — APTT: aPTT: 33 seconds (ref 24–36)

## 2019-09-26 LAB — PROTIME-INR
INR: 1 (ref 0.8–1.2)
Prothrombin Time: 12.4 seconds (ref 11.4–15.2)

## 2019-09-26 LAB — ETHANOL: Alcohol, Ethyl (B): <10 mg/dL (ref <10)

## 2019-09-26 LAB — MRSA PCR SCREENING: MRSA by PCR: NEGATIVE

## 2019-09-26 SURGERY — IR WITH ANESTHESIA
Anesthesia: General

## 2019-09-26 MED ORDER — SPIRONOLACTONE 25 MG PO TABS
25.0000 mg | ORAL_TABLET | Freq: Every day | ORAL | Status: DC
  Administered 2019-09-26 – 2019-09-28 (×3): 25 mg via ORAL
  Filled 2019-09-26 (×3): qty 1

## 2019-09-26 MED ORDER — LIDOCAINE HCL (CARDIAC) PF 50 MG/5ML IV SOSY
PREFILLED_SYRINGE | INTRAVENOUS | Status: DC | PRN
  Administered 2019-09-26: 80 mg via INTRAVENOUS

## 2019-09-26 MED ORDER — SUCCINYLCHOLINE CHLORIDE 20 MG/ML IJ SOLN
INTRAMUSCULAR | Status: DC | PRN
  Administered 2019-09-26: 120 mg via INTRAVENOUS

## 2019-09-26 MED ORDER — PROPOFOL 10 MG/ML IV BOLUS
INTRAVENOUS | Status: DC | PRN
  Administered 2019-09-26: 120 mg via INTRAVENOUS
  Administered 2019-09-26: 30 mg via INTRAVENOUS

## 2019-09-26 MED ORDER — FERROUS SULFATE 325 (65 FE) MG PO TABS
325.0000 mg | ORAL_TABLET | Freq: Three times a day (TID) | ORAL | Status: DC
  Administered 2019-09-27 – 2019-09-28 (×5): 325 mg via ORAL
  Filled 2019-09-26 (×5): qty 1

## 2019-09-26 MED ORDER — ACETAMINOPHEN 650 MG RE SUPP: 650.0000 mg | RECTAL | Status: DC | PRN

## 2019-09-26 MED ORDER — CEFAZOLIN SODIUM-DEXTROSE 2-4 GM/100ML-% IV SOLN
INTRAVENOUS | Status: AC
  Filled 2019-09-26: qty 100

## 2019-09-26 MED ORDER — ACETAMINOPHEN 325 MG PO TABS: 650.0000 mg | ORAL_TABLET | ORAL | Status: DC | PRN

## 2019-09-26 MED ORDER — EPHEDRINE SULFATE 50 MG/ML IJ SOLN
INTRAMUSCULAR | Status: DC | PRN
Start: 2019-09-26 — End: 2019-09-26
  Administered 2019-09-26: 10 mg via INTRAVENOUS

## 2019-09-26 MED ORDER — FENTANYL CITRATE (PF) 100 MCG/2ML IJ SOLN
25.0000 ug | INTRAMUSCULAR | Status: DC | PRN
  Administered 2019-09-26 (×2): 100 ug via INTRAVENOUS
  Filled 2019-09-26 (×2): qty 2

## 2019-09-26 MED ORDER — PHENYLEPHRINE HCL-NACL 10-0.9 MG/250ML-% IV SOLN: 0.0000 ug/min | INTRAVENOUS | Status: DC

## 2019-09-26 MED ORDER — CHLORHEXIDINE GLUCONATE CLOTH 2 % EX PADS
6.0000 | MEDICATED_PAD | Freq: Every day | CUTANEOUS | Status: DC
  Administered 2019-09-26: 6 via TOPICAL

## 2019-09-26 MED ORDER — ASPIRIN 300 MG RE SUPP
300.0000 mg | Freq: Every day | RECTAL | Status: DC
  Administered 2019-09-26: 300 mg via RECTAL
  Filled 2019-09-26: qty 1

## 2019-09-26 MED ORDER — ROCURONIUM BROMIDE 100 MG/10ML IV SOLN
INTRAVENOUS | Status: DC | PRN
  Administered 2019-09-26: 10 mg via INTRAVENOUS
  Administered 2019-09-26: 40 mg via INTRAVENOUS

## 2019-09-26 MED ORDER — CHLORHEXIDINE GLUCONATE 4 % EX LIQD
CUTANEOUS | Status: AC
  Filled 2019-09-26: qty 15

## 2019-09-26 MED ORDER — CLEVIDIPINE BUTYRATE 0.5 MG/ML IV EMUL
INTRAVENOUS | Status: AC
  Filled 2019-09-26: qty 50

## 2019-09-26 MED ORDER — ASPIRIN 325 MG PO TABS: 325.0000 mg | ORAL_TABLET | Freq: Every day | ORAL | Status: DC

## 2019-09-26 MED ORDER — PROPOFOL 1000 MG/100ML IV EMUL
5.0000 ug/kg/min | INTRAVENOUS | Status: DC
  Administered 2019-09-26: 40 ug/kg/min via INTRAVENOUS
  Filled 2019-09-26: qty 100

## 2019-09-26 MED ORDER — ORAL CARE MOUTH RINSE
15.0000 mL | OROMUCOSAL | Status: DC
  Administered 2019-09-26 (×5): 15 mL via OROMUCOSAL

## 2019-09-26 MED ORDER — ATORVASTATIN CALCIUM 40 MG PO TABS
40.0000 mg | ORAL_TABLET | Freq: Every day | ORAL | Status: DC
  Administered 2019-09-26 – 2019-09-28 (×3): 40 mg via ORAL
  Filled 2019-09-26 (×4): qty 1

## 2019-09-26 MED ORDER — STROKE: EARLY STAGES OF RECOVERY BOOK
Freq: Once | Status: AC
  Filled 2019-09-26: qty 1

## 2019-09-26 MED ORDER — FENTANYL CITRATE (PF) 100 MCG/2ML IJ SOLN
INTRAMUSCULAR | Status: DC | PRN
  Administered 2019-09-26 (×2): 50 ug via INTRAVENOUS

## 2019-09-26 MED ORDER — PANTOPRAZOLE SODIUM 40 MG IV SOLR
40.0000 mg | INTRAVENOUS | Status: DC
  Administered 2019-09-26: 40 mg via INTRAVENOUS
  Filled 2019-09-26: qty 40

## 2019-09-26 MED ORDER — CLEVIDIPINE BUTYRATE 0.5 MG/ML IV EMUL
0.0000 mg/h | INTRAVENOUS | Status: DC
  Administered 2019-09-26: 18 mg/h via INTRAVENOUS
  Administered 2019-09-26: 4 mg/h via INTRAVENOUS
  Administered 2019-09-26: 21 mg/h via INTRAVENOUS
  Administered 2019-09-26: 2 mg/h via INTRAVENOUS
  Administered 2019-09-26: 16 mg/h via INTRAVENOUS
  Administered 2019-09-27: 13 mg/h via INTRAVENOUS
  Administered 2019-09-27: 14 mg/h via INTRAVENOUS
  Filled 2019-09-26: qty 100
  Filled 2019-09-26 (×8): qty 50

## 2019-09-26 MED ORDER — CEFAZOLIN SODIUM-DEXTROSE 2-3 GM-%(50ML) IV SOLR
INTRAVENOUS | Status: DC | PRN
Start: 2019-09-26 — End: 2019-09-26
  Administered 2019-09-26: 2 g via INTRAVENOUS

## 2019-09-26 MED ORDER — PHENYLEPHRINE HCL-NACL 10-0.9 MG/250ML-% IV SOLN
INTRAVENOUS | Status: DC | PRN
Start: 2019-09-26 — End: 2019-09-26
  Administered 2019-09-26: 25 ug/min via INTRAVENOUS

## 2019-09-26 MED ORDER — IOHEXOL 350 MG/ML SOLN
115.0000 mL | Freq: Once | INTRAVENOUS | Status: AC | PRN
  Administered 2019-09-26: 115 mL via INTRAVENOUS

## 2019-09-26 MED ORDER — POLYETHYLENE GLYCOL 3350 17 G PO PACK: 17.0000 g | PACK | Freq: Every day | ORAL | Status: DC | PRN

## 2019-09-26 MED ORDER — PROPOFOL 500 MG/50ML IV EMUL
INTRAVENOUS | Status: DC | PRN
  Administered 2019-09-26: 50 ug/kg/min via INTRAVENOUS

## 2019-09-26 MED ORDER — APIXABAN 5 MG PO TABS
5.0000 mg | ORAL_TABLET | Freq: Two times a day (BID) | ORAL | Status: DC
  Administered 2019-09-27 – 2019-09-28 (×3): 5 mg via ORAL
  Filled 2019-09-26 (×3): qty 1

## 2019-09-26 MED ORDER — IOHEXOL 300 MG/ML  SOLN
150.0000 mL | Freq: Once | INTRAMUSCULAR | Status: AC | PRN
  Administered 2019-09-26: 60 mL via INTRA_ARTERIAL

## 2019-09-26 MED ORDER — INSULIN ASPART 100 UNIT/ML ~~LOC~~ SOLN: 0.0000 [IU] | SUBCUTANEOUS | Status: DC

## 2019-09-26 MED ORDER — FENTANYL CITRATE (PF) 100 MCG/2ML IJ SOLN
INTRAMUSCULAR | Status: AC
  Filled 2019-09-26: qty 2

## 2019-09-26 MED ORDER — POLYETHYLENE GLYCOL 3350 17 G PO PACK
17.0000 g | PACK | Freq: Every day | ORAL | Status: DC
  Administered 2019-09-27: 17 g via ORAL
  Filled 2019-09-26 (×2): qty 1

## 2019-09-26 MED ORDER — LISINOPRIL 20 MG PO TABS
20.0000 mg | ORAL_TABLET | Freq: Every day | ORAL | Status: DC
  Filled 2019-09-26: qty 1

## 2019-09-26 MED ORDER — DOCUSATE SODIUM 50 MG/5ML PO LIQD
100.0000 mg | Freq: Two times a day (BID) | ORAL | Status: DC
  Administered 2019-09-27 – 2019-09-28 (×3): 100 mg via ORAL
  Filled 2019-09-26 (×4): qty 10

## 2019-09-26 MED ORDER — DOCUSATE SODIUM 100 MG PO CAPS: 100.0000 mg | ORAL_CAPSULE | Freq: Two times a day (BID) | ORAL | Status: DC | PRN

## 2019-09-26 MED ORDER — NITROGLYCERIN 1 MG/10 ML FOR IR/CATH LAB
INTRA_ARTERIAL | Status: AC
  Filled 2019-09-26: qty 10

## 2019-09-26 MED ORDER — METOPROLOL TARTRATE 25 MG PO TABS
25.0000 mg | ORAL_TABLET | Freq: Two times a day (BID) | ORAL | Status: DC
  Administered 2019-09-26 – 2019-09-28 (×4): 25 mg via ORAL
  Filled 2019-09-26 (×4): qty 1

## 2019-09-26 MED ORDER — FENTANYL CITRATE (PF) 100 MCG/2ML IJ SOLN: 25.0000 ug | INTRAMUSCULAR | Status: DC | PRN

## 2019-09-26 MED ORDER — ACETAMINOPHEN 160 MG/5ML PO SOLN: 650.0000 mg | ORAL | Status: DC | PRN

## 2019-09-26 MED ORDER — CHLORHEXIDINE GLUCONATE 0.12% ORAL RINSE (MEDLINE KIT)
15.0000 mL | Freq: Two times a day (BID) | OROMUCOSAL | Status: DC
  Administered 2019-09-26: 15 mL via OROMUCOSAL

## 2019-09-26 MED ORDER — SODIUM CHLORIDE 0.9 % IV SOLN: INTRAVENOUS | Status: DC

## 2019-09-26 MED ORDER — PANTOPRAZOLE SODIUM 40 MG PO TBEC
40.0000 mg | DELAYED_RELEASE_TABLET | Freq: Every day | ORAL | Status: DC
  Administered 2019-09-26 – 2019-09-28 (×3): 40 mg via ORAL
  Filled 2019-09-26 (×3): qty 1

## 2019-09-26 MED ORDER — SODIUM CHLORIDE 0.9% FLUSH: 3.0000 mL | Freq: Once | INTRAVENOUS | Status: DC

## 2019-09-26 NOTE — Progress Notes (Signed)
SLP Cancellation Note  Patient Details Name: Gregory Perry MRN: [id number] DOB: 1950/08/26   Cancelled treatment:        On vent. Will follow.   [name] 09/26/2019, 8:06 AM    [name].Ed Risk analyst [phone] Office [phone]

## 2019-09-26 NOTE — Progress Notes (Signed)
eLink Physician-Brief Progress Note Patient Name: Gregory Perry DOB: July 11, 1950 MRN: [id number]   Date of Service  09/26/2019  HPI/Events of Note  Pt needs order for Phenylephrine for BP support targeting neurology blood pressure parameters.  eICU Interventions  Phenylephrine infusion orders entered.        [name] Janyth Riera 09/26/2019, 5:32 AM

## 2019-09-26 NOTE — Code Documentation (Addendum)
Responded to Code Stroke called at 0054 on pt already in ED. Pt LSN-1500(5/1), L sided weakness. Pt NIH-8, CBG-90, CT head negative for acute changes. CTA/CTP-1. Occlusion of the right MCA distal M2 segment, chronic left M2 occlusion, 36 mL prenumbra, 8 cc core infarct. IR paged out at [facility] and pt transported to IR intubation bay at [facility].

## 2019-09-26 NOTE — Procedures (Addendum)
Neuro-Interventional Radiology  Post Cerebral Angiogram Procedure Note  History:   69 yo male presenting with left sided weakness, last seen normal at 3pm.  Code Stroke patient, with work-up revealing right MCA territory ELVO.  NIHSS:   8 Last Known Well:  3pm ASPECTS:   10 Site of occlusion:  Right M2, MCA territory, superior division   IV tPA administered?: no IA Medication:  no  Puncture   02:18 First Pass   02:36 Device   3x20solitaire with intermediate suction catheter, CAT-5 Final mTICI Score & time: TICI-3 & 2:36  Anesthesia    GETA  Procedure:  - US guided R CFA access - Cerebral Angiogram - Mechanical thrombectomy of ELVO involving Right M2 of MCA, superior division - Deployment of Angioseal for hemostasis - Flat panel CT in NIR suite  Findings:  TICI-0 start through the affected Right M2 superior division territory TICI-3 final of right MCA  Flat panel with no complicating features  Complications: None  EBL: 50cc  Family updated: Tarry Kos [phone]  Recommendations: - right hip straight overnight - Goal SBP 120-140.   - Frequent NV checks - Repeat CT or MRI imaging recommended within 36 hours, discretion of Neurology - NIR to follow - Disposition 4N23 - Extubate when able (could not extubate in room secondary to delay in the COVID lab out of the ED -- remains COVID unknown at completion).  Signed,  Dulcy Fanny. Earleen Newport, DO

## 2019-09-26 NOTE — Sedation Documentation (Signed)
Spoke with Janett Billow, RN who was caring for Mr. Marolda in the ED. COVID panel was collected, however, has not taken to the lab. She will walk the specimen to the lab now.

## 2019-09-26 NOTE — ED Provider Notes (Signed)
CHIEF COMPLAINT: Code Stroke  HPI: Patient is a 69 year old male with history of hypertension, alcohol abuse, previous stroke, a fib on Eliquis who presents to ED with complaints of fatigue, generalized weakness.  States he is feeling dizzy.  Last seen normal at 3 PM yesterday.  Triage nurse noted left sided neglect and initiated a code stroke.  Patient denies any chest pain, shortness of breath.  He denies any numbness or focal weakness.  No vision changes.  He stopped taking his medications about 3 months ago.  ROS: See HPI Constitutional: no fever  Eyes: no drainage  ENT: no runny nose   Cardiovascular:  no chest pain  Resp: no SOB  GI: no vomiting GU: no dysuria Integumentary: no rash  Allergy: no hives  Musculoskeletal: no leg swelling  Neurological: no slurred speech ROS otherwise negative  PAST MEDICAL HISTORY/PAST SURGICAL HISTORY:  Past Medical History:  Diagnosis Date  . Alcohol abuse   . Candida esophagitis ([place])   . Cardiomyopathy ([place])   . CVA (cerebral infarction)   . Duodenal ulcer   . Dysphagia   . Headache(784.0)    history of  . Hypertension   . Prostate cancer ([place]) 12/2018  . Severe protein-calorie malnutrition ([place])   . Stroke [facility])     MEDICATIONS:  Prior to Admission medications   Medication Sig Start Date End Date Taking? Authorizing Provider  amLODipine (NORVASC) 2.5 MG tablet Take 1 tablet (2.5 mg total) by mouth daily. 03/15/19   Shirley Friar, PA-C  apixaban (ELIQUIS) 5 MG TABS tablet Take 1 tablet (5 mg total) by mouth 2 (two) times daily. 03/15/19   Shirley Friar, PA-C  atorvastatin (LIPITOR) 40 MG tablet TAKE 1 TABLET BY MOUTH DAILY AT 6 PM. 03/15/19   Tillery, Satira Mccallum, PA-C  Blood Pressure Monitoring (BLOOD PRESSURE CUFF) MISC Check blood pressure daily between 8 pm -9 pm 09/13/16   [name], FNP  ferrous sulfate 325 (65 FE) MG tablet Take 1 tablet (325 mg total) by mouth daily. 08/09/18   Azzie Glatter, FNP  lisinopril (ZESTRIL) 40 MG tablet Take 1 tablet (40 mg total) by mouth daily. 03/15/19   Shirley Friar, PA-C  megestrol (MEGACE) 40 MG tablet TAKE 1 TABLET BY MOUTH DAILY. 12/07/18   Azzie Glatter, FNP  metoprolol tartrate (LOPRESSOR) 25 MG tablet Take 1 tablet (25 mg total) by mouth 2 (two) times daily. 03/15/19   Shirley Friar, PA-C  pantoprazole (PROTONIX) 40 MG tablet Take 1 tablet (40 mg total) by mouth 2 (two) times daily. 03/15/19   Shirley Friar, PA-C  potassium chloride SA (KLOR-CON) 20 MEQ tablet Take 2 tablets (40 mEq total) by mouth daily. 03/15/19   Shirley Friar, PA-C  spironolactone (ALDACTONE) 25 MG tablet Take 1 tablet (25 mg total) by mouth daily. 03/15/19   Shirley Friar, PA-C    ALLERGIES:  No Known Allergies  SOCIAL HISTORY:  Social History   Tobacco Use  . Smoking status: Current Every Day Smoker    Packs/day: 0.50    Years: 30.00    Pack years: 15.00    Types: Cigarettes  . Smokeless tobacco: Never Used  . Tobacco comment: Pt smokes 6 to 7 a day trying to quit  Substance Use Topics  . Alcohol use: No    Alcohol/week: 3.0 standard drinks    Types: 3 Shots of liquor per week    Comment: quit in 10/2015     FAMILY  HISTORY: Family History  Problem Relation Age of Onset  . Heart attack Maternal Grandfather   . Colon cancer Neg Hx   . Breast cancer Neg Hx   . Prostate cancer Neg Hx     EXAM: BP (!) 168/117 (BP Location: Right Arm)   Pulse 90   Temp 97.6 F (36.4 C) (Oral)   Resp 20   Ht 5\' 11"  (1.803 m)   Wt 70.5 kg   SpO2 100%   BMI 21.68 kg/m  CONSTITUTIONAL: Alert and oriented x 3 but slow to answer the year and responds appropriately to questions.  Chronically ill-appearing.  In no distress. HEAD: Normocephalic, atraumatic EYES: Conjunctivae clear, pupils appear equal, EOM appear intact ENT: normal nose; moist mucous membranes NECK: Supple, normal ROM CARD: RRR; S1 and S2  appreciated; no murmurs, no clicks, no rubs, no gallops RESP: Normal chest excursion without splinting or tachypnea; breath sounds clear and equal bilaterally; no wheezes, no rhonchi, no rales, no hypoxia or respiratory distress, speaking full sentences ABD/GI: Normal bowel sounds; non-distended; soft, non-tender, no rebound, no guarding, no peritoneal signs, no hepatosplenomegaly BACK:  The back appears normal EXT: Normal ROM in all joints; no deformity noted, no edema; no cyanosis SKIN: Normal color for age and race; warm; no rash on exposed skin NEURO: Moves all extremities equally, patient has left-sided neglect, speech normal without dysarthria or aphasia, previously had right gaze preference but improved slightly per nurse, no facial asymmetry PSYCH: The patient's mood and manner are appropriate.   MEDICAL DECISION MAKING: Patient here as a code stroke.  Labs, imaging, EKG reviewed/interpreted.  Labs show no significant abnormality.  CT head shows no acute abnormality.  EKG shows sinus rhythm.  CT perfusion study shows an occlusion of the right MCA distal M2 segment corresponding to region of ischemia in the posterior right MCA territory.  Seen by Dr. Leonel Ramsay with neurology.  Patient will be taken emergently to interventional radiology.  Currently protecting airway.  IR, anesthesia have been notified per nursing staff.  Patient consented for procedure by neurology.  Patient sister also updated by phone by neurologist.  I reviewed all nursing notes and pertinent previous records as available.  I have reviewed and interpreted any EKGs, lab and urine results, imaging (as available).    EKG Interpretation  Date/Time:  Sunday Sep 26 2019 00:34:29 EDT Ventricular Rate:  89 PR Interval:  218 QRS Duration: 98 QT Interval:  388 QTC Calculation: 472 R Axis:   -10 Text Interpretation: Sinus rhythm with 1st degree A-V block with Premature supraventricular complexes Left ventricular hypertrophy  with repolarization abnormality ( Cornell product ) Abnormal ECG No significant change since last tracing Confirmed by Ingra Rother, Cyril Mourning [phone]) on 09/26/2019 1:02:28 AM       CRITICAL CARE Performed by: Cyril Mourning Ketty Bitton   Total critical care time: 35 minutes  Critical care time was exclusive of separately billable procedures and treating other patients.  Critical care was necessary to treat or prevent imminent or life-threatening deterioration.  Critical care was time spent personally by me on the following activities: development of treatment plan with patient and/or surrogate as well as nursing, discussions with consultants, evaluation of patient's response to treatment, examination of patient, obtaining history from patient or surrogate, ordering and performing treatments and interventions, ordering and review of laboratory studies, ordering and review of radiographic studies, pulse oximetry and re-evaluation of patient's condition.   SION GINSBURG was evaluated in Emergency Department on 09/26/2019 for the symptoms described in  the history of present illness. He was evaluated in the context of the global COVID-19 pandemic, which necessitated consideration that the patient might be at risk for infection with the SARS-CoV-2 virus that causes COVID-19. Institutional protocols and algorithms that pertain to the evaluation of patients at risk for COVID-19 are in a state of rapid change based on information released by regulatory bodies including the CDC and federal and state organizations. These policies and algorithms were followed during the patient's care in the ED.      Morrie Daywalt, Delice Bison, DO 09/26/19 0202

## 2019-09-26 NOTE — Transfer of Care (Signed)
Immediate Anesthesia Transfer of Care Note  Patient: Gregory Perry  Procedure(s) Performed: IR WITH ANESTHESIA (N/A )  Patient Location: ICU  Anesthesia Type:General  Level of Consciousness: sedated  Airway & Oxygen Therapy: Patient remains intubated per anesthesia plan and Patient placed on Ventilator (see vital sign flow sheet for setting)  Post-op Assessment: Report given to RN and Post -op Vital signs reviewed and stable  Post vital signs: Reviewed and stable  Last Vitals:  Vitals Value Taken Time  BP    Temp    Pulse    Resp    SpO2      Last Pain:  Vitals:   09/26/19 0128  TempSrc:   PainSc: 0-No pain         Complications: No apparent anesthesia complications. Elevated B/P with stimulation (turning, suctioning).  ICU started Cleviprex.

## 2019-09-26 NOTE — Sedation Documentation (Signed)
Groin and pulses assessed bedside upon arrival to 4N23. No change, see flowsheet.

## 2019-09-26 NOTE — Progress Notes (Signed)
NeuroInterventional Radiology  Pre-Procedure Note  History: 69 yo male presenting with left sided weakness, last seen normal at 3pm.  Code Stroke patient, with work-up revealing right MCA territory ELVO.   Discussed emergently by telephone with Dr. Leonel Ramsay, via [place].   NIHSS:  8  CT ASPECTS: 10 CTA:   Right M2 occlusion CTP:   8cc core, 36cc penumbra, mismatch volume 28   Given the patient's symptoms, imaging findings, baseline function, I believe they are an appropriate candidate for mechanical thrombectomy, as he is functional at baseline, and clearly he would have deficits if the stroke goes to completion at this volume.    The risks and benefits of the procedure were discussed with the patient directly, who demonstrated understanding of the situation, with specific risks including: bleeding, infection, arterial injury/dissection, contrast reaction, kidney injury, need for further procedure/surgery, neurologic deficit, 10-15% risk of intracranial hemorrhage, cardiopulmonary collapse, death. All questions were answered.  The patient would like to proceed with attempt at thrombectomy.   2-physician verbal consent was signed before intubation was performed.   Plan for cerebral angiogram and attempt at mechanical thrombectomy.   Signed,   Dulcy Fanny. Earleen Newport, DO

## 2019-09-26 NOTE — Anesthesia Procedure Notes (Signed)
Arterial Line Insertion Start/End5/06/2019 2:18 AM, 09/26/2019 2:18 AM Performed by: Suzy Bouchard, CRNA, CRNA  Patient location: OOR procedure area. Emergency situation radial was placed Catheter size: 20 G Hand hygiene performed  and Seldinger technique used  Attempts: 1 Procedure performed without using ultrasound guided technique. Following insertion, Biopatch and dressing applied. Post procedure assessment: normal  Patient tolerated the procedure well with no immediate complications.

## 2019-09-26 NOTE — Anesthesia Preprocedure Evaluation (Signed)
Anesthesia Evaluation  Patient identified by MRN, date of birth, ID band Patient awake    Reviewed: Allergy & PrecautionsPreop documentation limited or incomplete due to emergent nature of procedure.  Airway Mallampati: II  TM Distance: >3 FB     Dental   Pulmonary Current Smoker and Patient abstained from smoking.,    breath sounds clear to auscultation       Cardiovascular hypertension,  Rhythm:Regular Rate:Normal     Neuro/Psych    GI/Hepatic PUD, GERD  ,  Endo/Other    Renal/GU      Musculoskeletal   Abdominal   Peds  Hematology   Anesthesia Other Findings   Reproductive/Obstetrics                             Anesthesia Physical Anesthesia Plan  ASA: III  Anesthesia Plan: General   Post-op Pain Management:    Induction: Intravenous, Rapid sequence and Cricoid pressure planned  PONV Risk Score and Plan: 1 and Ondansetron and Treatment may vary due to age or medical condition  Airway Management Planned: Oral ETT  Additional Equipment:   Intra-op Plan:   Post-operative Plan: Possible Post-op intubation/ventilation  Informed Consent:   Plan Discussed with: Anesthesiologist and CRNA  Anesthesia Plan Comments:         Anesthesia Quick Evaluation

## 2019-09-26 NOTE — Progress Notes (Signed)
eLink Physician-Brief Progress Note Patient Name: Gregory Perry DOB: 15-Jul-1950 MRN: [id number]   Date of Service  09/26/2019  HPI/Events of Note  Pt with sub-optimal sedation.  eICU Interventions  Propofol ceiling increased to 80 mcg, Pt has PRN fentanyl orders.        [name] [name] 09/26/2019, 5:04 AM

## 2019-09-26 NOTE — Sedation Documentation (Signed)
Procedure complete, however, COVID panel results not resulted. Roselyn Reef, RN, 4N Charge contacted and SBAR discussed. Will call me back with a plan.

## 2019-09-26 NOTE — ED Notes (Signed)
[phone] pts sister Tarry Kos would like to be called when pt gets a room so she can come

## 2019-09-26 NOTE — Progress Notes (Signed)
PCCM Progress note  Patient is a 69 year old male with extensive past medical history who presented with left-sided neglect and was found to have a left MCA ELVO requiring mechanical thrombectomy.  Patient was left intubated post procedure due to unknown COVID-19 test result.  Acute right MCA territory due to emergent large vessel occlusion Plan: Management per neurology  Maintain neuro protective measures; goal for eurothermia, euglycemia, eunatermia, normoxia, and PCO2 goal of 35-40 Nutrition and bowel regiment  Seizure precautions  AEDs per neurology  Continue Cleviprex drip for SBP goal 120-1 40  Respiratory insufficiency secondary to acute stroke Plan: Continue ventilator support with lung protective strategies  SAT/SBT this morning, mentation preclude extubation if successful will plan for extubation Wean PEEP and FiO2 for sats greater than 90%. Head of bed elevated 30 degrees. Plateau pressures less than 30 cm H20.  Ensure adequate pulmonary hygiene  Follow cultures  VAP bundle in place  PAD protocol   Please see early morning consult note for further details    CRITICAL CARE Performed by: Johnsie Cancel  Total critical care time: 35 minutes  Critical care time was exclusive of separately billable procedures and treating other patients.  Critical care was necessary to treat or prevent imminent or life-threatening deterioration.  Critical care was time spent personally by me on the following activities: development of treatment plan with patient and/or surrogate as well as nursing, discussions with consultants, evaluation of patient's response to treatment, examination of patient, obtaining history from patient or surrogate, ordering and performing treatments and interventions, ordering and review of laboratory studies, ordering and review of radiographic studies, pulse oximetry and re-evaluation of patient's condition.  Johnsie Cancel, NP-C [facility] Contact / Pager information can be found on Am[facility]  09/26/2019, 7:46 AM

## 2019-09-26 NOTE — H&P (Signed)
Neurology H&P  CC: Left hand weakness  History is obtained from: Patient  HPI: Gregory Perry is a 69 y.o. male with a history of cardiomyopathy, stroke, atrial fibrillation who presents with left-sided deficits that started around 3 PM.  He states that he noticed that his left hand was simply not working as well.  He thinks that he is having a stroke.  Of note, he has been noncompliant with his medications including his Eliquis for about 3 months.  On arrival to the emergency department a code stroke was activated after LVO signs were noted.  He was taken for emergent CT which was negative, CTA revealed a distal M2 occlusion, and CT perfusion revealed about 28 cc of penumbra.  I discussed the risks and benefits both with the patient as well as with his sister, they both agreed with proceeding.  LKW: 3 PM tpa given?: No, out of window IR Thrombectomy?  Yes Modified Rankin Scale: 1-No significant post stroke disability and can perform usual duties with stroke symptoms NIHSS: 8   ROS: A complete ROS was performed and is negative except as noted in the HPI.   Past Medical History:  Diagnosis Date  . Alcohol abuse   . Candida esophagitis ([place])   . Cardiomyopathy ([place])   . CVA (cerebral infarction)   . Duodenal ulcer   . Dysphagia   . Headache(784.0)    history of  . Hypertension   . Prostate cancer ([place]) 12/2018  . Severe protein-calorie malnutrition ([place])   . Stroke [facility])      Family History  Problem Relation Age of Onset  . Heart attack Maternal Grandfather   . Colon cancer Neg Hx   . Breast cancer Neg Hx   . Prostate cancer Neg Hx      Social History:  reports that he has been smoking cigarettes. He has a 15.00 pack-year smoking history. He has never used smokeless tobacco. He reports current drug use. Drug: Marijuana. He reports that he does not drink alcohol.   Prior to Admission medications   Medication Sig Start Date End Date Taking? Authorizing Provider   amLODipine (NORVASC) 2.5 MG tablet Take 1 tablet (2.5 mg total) by mouth daily. 03/15/19   Shirley Friar, PA-C  apixaban (ELIQUIS) 5 MG TABS tablet Take 1 tablet (5 mg total) by mouth 2 (two) times daily. 03/15/19   Shirley Friar, PA-C  atorvastatin (LIPITOR) 40 MG tablet TAKE 1 TABLET BY MOUTH DAILY AT 6 PM. 03/15/19   Tillery, Satira Mccallum, PA-C  Blood Pressure Monitoring (BLOOD PRESSURE CUFF) MISC Check blood pressure daily between 8 pm -9 pm 09/13/16   [name], FNP  ferrous sulfate 325 (65 FE) MG tablet Take 1 tablet (325 mg total) by mouth daily. 08/09/18   Azzie Glatter, FNP  lisinopril (ZESTRIL) 40 MG tablet Take 1 tablet (40 mg total) by mouth daily. 03/15/19   Shirley Friar, PA-C  megestrol (MEGACE) 40 MG tablet TAKE 1 TABLET BY MOUTH DAILY. 12/07/18   Azzie Glatter, FNP  metoprolol tartrate (LOPRESSOR) 25 MG tablet Take 1 tablet (25 mg total) by mouth 2 (two) times daily. 03/15/19   Shirley Friar, PA-C  pantoprazole (PROTONIX) 40 MG tablet Take 1 tablet (40 mg total) by mouth 2 (two) times daily. 03/15/19   Shirley Friar, PA-C  potassium chloride SA (KLOR-CON) 20 MEQ tablet Take 2 tablets (40 mEq total) by mouth daily. 03/15/19   Shirley Friar, PA-C  spironolactone (ALDACTONE) 25 MG tablet Take 1 tablet (25 mg total) by mouth daily. 03/15/19   Shirley Friar, PA-C     Exam: Current vital signs: BP (!) 168/117 (BP Location: Right Arm)   Pulse 90   Temp 97.6 F (36.4 C) (Oral)   Resp 20   Ht 5\' 11"  (1.803 m)   Wt 70.5 kg   SpO2 100%   BMI 21.68 kg/m    Physical Exam  Constitutional: Appears well-developed and well-nourished.  Psych: Affect appropriate to situation Eyes: No scleral injection HENT: No OP obstrucion Head: Normocephalic.  Cardiovascular: Normal rate and regular rhythm.  Respiratory: Effort normal and breath sounds normal to anterior ascultation GI: Soft.  No distension.  There is no tenderness.  Skin: WDI  Neuro: Mental Status: Patient is awake, alert, oriented to person, place, month, age No signs of aphasia, significant neglect Cranial Nerves: II: LEFT hemianopia. Pupils are equal, round, and reactive to light.   III,IV, VI: R gaze prefrence V: Facial sensation is diminished on the left.  VII: Facial movement with mild decreased NL fold on the right VIII: hearing is intact to voice X: Uvula elevates symmetrically XI: Shoulder shrug is symmetric. XII: tongue is midline without atrophy or fasciculations.  Motor: He has left hand weakness, but no drift Sensory: Sensation is markedly diminished, absent in left arm, diminished in left leg, he does extinguish in the left leg.  Cerebellar: FNF intact bilaterally   I have reviewed labs in epic and the results pertinent to this consultation are: Cr 0.9   I have reviewed the images obtained:CT head- significant old diseaseincluding L PCA infarct, but no acute findings.   Primary Diagnosis:  Cerebral infarction due to embolism of  right middle cerebral artery.   Secondary Diagnosis: Chronic systolic (congestive) heart failure and Paroxysmal atrial fibrillation   Impression: 69 yo with cardiomyopathy non-compliant with any medications who has likely embolic stroke in the setting of anti-coagulation non-compliance.  At this time he has undergone thrombectomy.  Unfortunately Covid results were not back at the time that the procedure was over and therefore he is returned to the floor intubated.   Plan: - HgbA1c, fasting lipid panel - MRI, MRA  of the brain without contrast - Frequent neuro checks - Echocardiogram - Carotid dopplers - Prophylactic therapy-Antiplatelet med: Aspirin - dose 325mg  PO or 300mg  PR, likely will need anticoagulation in the long-term - Risk factor modification - Telemetry monitoring - PT consult, OT consult, Speech consult -Hold home antihypertensives. - Stroke team to  follow     This patient is critically ill and at significant risk of neurological worsening, death and care requires constant monitoring of vital signs, hemodynamics,respiratory and cardiac monitoring, neurological assessment, discussion with family, other specialists and medical decision making of high complexity. I spent 50 minutes of neurocritical care time  in the care of  this patient. This was time spent independent of any time provided by nurse practitioner or PA.  Roland Rack, MD [facility] [phone]  If 7pm- 7am, please Gregory neurology on call as listed in [place].

## 2019-09-26 NOTE — Progress Notes (Signed)
STROKE TEAM PROGRESS NOTE   INTERVAL HISTORY His RN is at the bedside.  Pt was extubated not long ago, doing well, awake alert and conversing well. He admitted that he still smokes, not follow up with cardiology or neurology for some time and not compliant with medication. He still has right hemianopia which is chronic. However, he stated that he is driving. He is left handed and felt that his left hand is not 100% yet but improving.   OBJECTIVE Vitals:   09/26/19 0500 09/26/19 0530 09/26/19 0551 09/26/19 0600  BP: 104/75 122/77  128/81  Pulse: (!) 52 (!) 47  (!) 55  Resp: 14 14  14   Temp:      TempSrc:      SpO2: 100% 100% 100% 99%  Weight:      Height:        CBC:  Recent Labs  Lab 09/26/19 0053 09/26/19 0053 09/26/19 0110 09/26/19 0549  WBC 6.8  --   --   --   HGB 10.9*   < > 12.6* 11.2*  HCT 36.6*   < > 37.0* 33.0*  MCV 77.7*  --   --   --   PLT 343  --   --   --    < > = values in this interval not displayed.    Basic Metabolic Panel:  Recent Labs  Lab 09/26/19 0053 09/26/19 0053 09/26/19 0110 09/26/19 0549  NA 139   < > 142 142  K 3.4*   < > 3.6 4.0  CL 104  --  103  --   CO2 25  --   --   --   GLUCOSE 100*  --  93  --   BUN 13  --  14  --   CREATININE 0.92  --  0.90  --   CALCIUM 9.6  --   --   --    < > = values in this interval not displayed.    Lipid Panel:     Component Value Date/Time   CHOL 154 08/03/2018 0853   TRIG 48 08/03/2018 0853   HDL 63 08/03/2018 0853   CHOLHDL 2.4 08/03/2018 0853   CHOLHDL 3.7 09/13/2016 0935   VLDL 16 09/13/2016 0935   LDLCALC 81 08/03/2018 0853   HgbA1c:  Lab Results  Component Value Date   HGBA1C 5.0 09/13/2016   Urine Drug Screen:     Component Value Date/Time   LABOPIA NONE DETECTED 11/16/2015 2330   COCAINSCRNUR NONE DETECTED 11/16/2015 2330   LABBENZ NONE DETECTED 11/16/2015 2330   AMPHETMU NONE DETECTED 11/16/2015 2330   THCU POSITIVE (A) 11/16/2015 2330   LABBARB NONE DETECTED 11/16/2015 2330     Alcohol Level     Component Value Date/Time   ETH <10 09/26/2019 0056    IMAGING  CT Code Stroke CTA Head W/WO contrast CT Code Stroke CTA Neck W/WO contrast CT Code Stroke Cerebral Perfusion with contrast 09/26/2019 IMPRESSION:  1. Occlusion of the right MCA distal M2 segment, corresponding to the region of ischemia in the posterior right MCA territory.  2. Chronic left M2 occlusion.  3. 36 mL area at risk ischemic brain according to perfusion analysis. The calculated core infarct is artifactual and relates to a remote right frontal lobe infarct.   [place] 09/26/2019 IMPRESSION:  Status post ultrasound guided access right common femoral artery for cervical and cerebral angiogram and mechanical thrombectomy of right MCA/M2 occlusion, with single pass, restoration of  TICI-3 flow and deployment of Angio-Seal.  PLAN: The patient will remain intubated, as he remains COVID status unknown ICU status Target systolic blood pressure of 120-140 Right hip straight time 6 hours Frequent neurovascular checks Repeat neurologic imaging with CT and/MRI at the discretion of neurology team.   DG CHEST PORT 1 VIEW 09/26/2019 IMPRESSION:  1. Endotracheal tube tip is 4.7 cm above the carina.  2. Mild left basilar atelectasis.   CT HEAD CODE STROKE WO CONTRAST 09/26/2019 IMPRESSION:  1. No acute intracranial abnormality.  2. ASPECTS is 10.  3. Multiple old infarcts and chronic ischemic microangiopathy.   Transthoracic Echocardiogram  Pending  ECG - SR rate 89 BPM. (See cardiology reading for complete details)   PHYSICAL EXAM  Temp:  [94.5 F (34.7 C)-97.6 F (36.4 C)] 97 F (36.1 C) (05/02 0800) Pulse Rate:  [43-106] 83 (05/02 0846) Resp:  [14-25] 16 (05/02 0846) BP: (104-175)/(55-121) 114/72 (05/02 0806) SpO2:  [98 %-100 %] 100 % (05/02 0846) Arterial Line BP: (114-193)/(56-108) 119/61 (05/02 0800) FiO2 (%):  [30 %-60 %] 30 % (05/02 0806) Weight:  [70.5 kg-75.1 kg] 75.1 kg (05/02  0400)  General - Well nourished, well developed, in no apparent distress.  Ophthalmologic - fundi not visualized due to noncooperation.  Cardiovascular - Regular rhythm and rate, not in afib.  Neuro - awake alert, no aphasia, normal fluent speech but mild dysarthria. Following commands well. Chronic right hemianopia. No gaze deviation. PERRL. EOMI. No facial asymmetry. Tongue midline. BUEs strength 5/5 equally. LLE 5-/5 and RLE 5/5. LUE decreased light touch sensation comparing with right. DTR 1+ and no babinski. Intact FTN bilaterally. Gait not tested.   ASSESSMENT/PLAN Mr. DESMOND MCEWAN is a 69 y.o. male with history of cardiomyopathy, prostate cancer, malnutrition, tobacco use, ETOH abuse, marijuana use, stroke, s/p loop implant, atrial fibrillation (noncompliant with his medications including his Eliquis for about 3 months) who presents with left-sided deficits that started around 3 PM. He did not receive IV t-PA due to late presentation (>4.5 hours from time of onset).   Stroke: Rt MCA territory stroke due to right M2 occlusion s/p IR with TICI3 - embolic due to PAF not on [facility]  Code Stroke CT Head - No acute intracranial abnormality. ASPECTS is 10. Multiple old infarcts and chronic ischemic microangiopathy.   CTA H&N - Occlusion of the right MCA distal M2 segment, corresponding to the region of ischemia in the posterior right MCA territory. Chronic left M2 occlusion.   CT Perfusion - 36 mL area at risk ischemic brain according to perfusion analysis.  IR - right MCA/M2 occlusion with TICI-3 flow  MRI head - pending  MRA head - pending  2D Echo - pending  Sars Corona Virus 2 - negative  LDL - 127  HgbA1c 5.5  UDS  + for THC  VTE prophylaxis - SCDs  No antithrombotics prior to admission, now on aspirin 300 mg suppository daily -> switch to eliquis if MRI no large infarct or hemorrhagic conversion  Patient counseled to be compliant with his antithrombotic  medications  Ongoing aggressive stroke risk factor management  Therapy recommendations:  pending  Disposition:  Pending  PAF  Loop recorder detected paroxysmal A. Fib  Follow with cardiology Dr. Lovena Le  On Eliquis, however patient noncompliant  Rate controlled  Resume Eliquis if MRI no large infarct or hemorrhagic conversion.  History of stroke  10/2015 admitted for left acute PCA infarcts.  MRI also showed chronic right parietal small infarcts.  MRA  showed left PCA severe stenosis, right PCA moderate stenosis.  CTA neck negative.  EF 35 to 40%.  Loop recorder placed.  LDL 136 and A1c 4.9.  Patient discharged on aspirin and Lipitor  Laser loop recorder showed paroxysmal A. fib, patient put on Eliquis  However patient noncompliant with medication and also lost follow-up.  Hypertension  Home BP meds: Zestril, Norvasc, Spironolactone, metoprolol  Current BP meds: none   BP goal 120-140 post IR . On cleviprex  . Long-term BP goal normotensive  Hyperlipidemia  Home Lipid lowering medication: None  LDL 127, goal < 70  Current lipid lowering medication: Lipitor 40  Continue statin at discharge  Tobacco abuse  Current smoker  Smoking cessation counseling provided  Pt is willing to quit  Other Stroke Risk Factors  Advanced age  Hx of ETOH abuse  Substance Abuse - marijuana -see seizure education provided  Other Active Problems  Code status - Full code  Mild anemia - Hb - 11.2  Bradycardia - 22's - 40's  Hospital day # 0  This patient is critically ill due to right MCA stroke, right M2 occlusion, status post IR, paroxysmal A. fib, hypertensive urgency and at significant risk of neurological worsening, death form recurrent stroke, hemorrhagic conversion, heart failure, seizure. This patient's care requires constant monitoring of vital signs, hemodynamics, respiratory and cardiac monitoring, review of multiple databases, neurological assessment, discussion  with family, other specialists and medical decision making of high complexity. I spent 35 minutes of neurocritical care time in the care of this patient.  Rosalin Hawking, MD PhD Stroke Neurology 09/26/2019 7:31 PM   To contact Stroke Continuity provider, please refer to [email or web address] After hours, contact General Neurology

## 2019-09-26 NOTE — Progress Notes (Signed)
Patient extubated by MD at [phone].

## 2019-09-26 NOTE — Progress Notes (Signed)
PT Cancellation Note  Patient Details Name: Gregory Perry MRN: [id number] DOB: [birth year]   Cancelled Treatment:    Reason Eval/Treat Not Completed: Patient not medically ready   Duncan Dull 09/26/2019, 8:20 AM Alben Deeds, PT DPT  Board Certified Neurologic Specialist Acute Rehabilitation Services

## 2019-09-26 NOTE — Sedation Documentation (Signed)
Pt to bed admitted to 4N23. Transport lead contacted to bring bed to IR, RT contacted and requested to bring ventilator for tranport.

## 2019-09-26 NOTE — Anesthesia Procedure Notes (Signed)
Procedure Name: Intubation Date/Time: 09/26/2019 2:07 AM Performed by: Suzy Bouchard, CRNA Pre-anesthesia Checklist: Patient identified, Suction available, Timeout performed, Patient being monitored and Emergency Drugs available Patient Re-evaluated:Patient Re-evaluated prior to induction Oxygen Delivery Method: Ambu bag Preoxygenation: Pre-oxygenation with 100% oxygen Induction Type: IV induction, Rapid sequence and Cricoid Pressure applied Laryngoscope Size: Glidescope and 4 Grade View: Grade I Tube type: Oral Tube size: 7.5 mm Number of attempts: 1 Airway Equipment and Method: Stylet and Video-laryngoscopy Placement Confirmation: positive ETCO2,  CO2 detector,  breath sounds checked- equal and bilateral and ETT inserted through vocal cords under direct vision Secured at: 24 cm Tube secured with: Tape Dental Injury: Teeth and Oropharynx as per pre-operative assessment

## 2019-09-26 NOTE — ED Triage Notes (Signed)
Pt states he is been feeling very fatigue and nauseated since 5 pm stated he was completely normal at lunch time, pt denies any visual changes, he is feeling dizzy, pt is AO x 4, no neuro deficit noticed on triage.

## 2019-09-26 NOTE — Consult Note (Signed)
NAME:  Gregory Perry, MRN:  [id number], DOB:  04/05/1951, LOS: 0 ADMISSION DATE:  09/25/2019, CONSULTATION DATE:  09/26/19 REFERRING MD:  Gregory Perry, CHIEF COMPLAINT:  dizziness   Brief History   69 y.o. M who presented with dizziness and L sided neglect with R MCA LVO s/p thrombectomy.  Pt remained intubated due to Covid-19 results not being available, so PCCM consulted for vent management.   History of present illness   Gregory Perry is a 69 y.o. M with PMH significant for CVA, afib supposed to be taking Eliquis, ETOH abuse, prostate Ca and cardiomyopathy who has not been taking his medications for three months who presented to the ED with dizziness. Last known well was 3pm 5/1.  Noted to have L-sided neglect and code stroke was called, initial NIH 8.  Ct head without acute abnormalities, CTA with  R MCA LVO.  Initial SBP 160's with no major lab abnormalities.    He was taken emergently mechanical thrombectomy, post-procedure he was left intubated because covid-19 results were not available. Pt was also started on a cleviprex gtt. PCCM consulted for vent management.   Past Medical History   has a past medical history of Alcohol abuse, Candida esophagitis ([place]), Cardiomyopathy ([place]), CVA (cerebral infarction), Duodenal ulcer, Dysphagia, Headache(784.0), Hypertension, Prostate cancer ([place]) (12/2018), Severe protein-calorie malnutrition ([place]), and Stroke ([place]).   Significant Hospital Events   5/2 Admit to neurology, PCCM consulted  Consults:  PCCM  Procedures:  5/2 Mechanical thrombectomy, R MCA LVO  Significant Diagnostic Tests:  5/2 CT head>>Multiple old infarcts and chronic ischemic microangiopathy. 5/2 CTA head and neck>> right MCA distal M2 segment occlusion  Micro Data:  5/2 RVP>>negative  Antimicrobials:   Ancef 5/2-  Interim history/subjective:  Pt transported to intensive care unit intubated in stable condition  Objective   Blood pressure (!) 175/121, pulse (!) 106,  temperature 97.6 F (36.4 C), temperature source Oral, resp. rate (!) 25, height 5\' 11"  (1.803 m), weight 70.5 kg, SpO2 98 %.    Vent Mode: PRVC FiO2 (%):  [60 %] 60 % Set Rate:  [14 bmp] 14 bmp Vt Set:  [600 mL] 600 mL PEEP:  [5 cmH20] 5 cmH20 Plateau Pressure:  [16 cmH20] 16 cmH20   Intake/Output Summary (Last 24 hours) at 09/26/2019 0402 Last data filed at 09/26/2019 0400 Gross per 24 hour  Intake 800.47 ml  Output 1015 ml  Net -214.53 ml   Filed Weights   09/26/19 0026  Weight: 70.5 kg    General:  Elderly, thin M intubated and sedated HEENT: MM pink/moist, ETT in place Neuro: no purposeful movement on propofol, pupils 72mm and reactive bilaterally CV: s1s2 rrr, no m/r/g PULM:  Lungs CTAB GI: soft, bsx4 active  Extremities: warm/dry, no edema  Skin: no rashes or lesions   Resolved Hospital Problem list     Assessment & Plan:    Acute R MCA LVO and CVA -likely secondary to atrial fibrillation and not taking Eliquis, s/p thrombectomy -intubated for procedure and  Transported to ICU intubated  P: -Covid-19 is negative, pt can be extubated in the AM -SBP goal 120-140 -Plan per neurology is MRI/MRA, Echo and carotid dopplers, [place], PT/OT consult --Maintain full vent support with SAT/SBT as tolerated -titrate Vent setting to maintain SpO2 greater than or equal to 90%. -HOB elevated 30 degrees. -Plateau pressures less than 30 cm H20.  -Follow chest x-ray, ABG prn.   -Bronchial hygiene and RT/bronchodilator protocol.   HTN, Atrial fibrillation -Non-compliant with  Eliquis, Metoprolol, Lisinopril P: -hold anti-coagulation post-procedure, will need to resume when cleared by neurology -On cleviprex, wean as able and resume home anti-hypertensives    Best practice:  Diet: N.p.o.  Pain/Anxiety/Delirium protocol (if indicated): Propofol VAP protocol (if indicated): HOB 30 degrees, daily SBT DVT prophylaxis: SCD GI prophylaxis: Protonix Glucose control: SSI Mobility:  Bedrest Code Status: Full code Family Communication: Per primary Disposition: ICU  Labs   CBC: Recent Labs  Lab 09/26/19 0053 09/26/19 0110  WBC 6.8  --   HGB 10.9* 12.6*  HCT 36.6* 37.0*  MCV 77.7*  --   PLT 343  --     Basic Metabolic Panel: Recent Labs  Lab 09/26/19 0053 09/26/19 0110  NA 139 142  K 3.4* 3.6  CL 104 103  CO2 25  --   GLUCOSE 100* 93  BUN 13 14  CREATININE 0.92 0.90  CALCIUM 9.6  --    GFR: Estimated Creatinine Clearance: 80.5 mL/min (by C-G formula based on SCr of 0.9 mg/dL). Recent Labs  Lab 09/26/19 0053  WBC 6.8    Liver Function Tests: No results for input(s): AST, ALT, ALKPHOS, BILITOT, PROT, ALBUMIN in the last 168 hours. No results for input(s): LIPASE, AMYLASE in the last 168 hours. No results for input(s): AMMONIA in the last 168 hours.  ABG    Component Value Date/Time   TCO2 29 09/26/2019 0110     Coagulation Profile: Recent Labs  Lab 09/26/19 0056  INR 1.0    Cardiac Enzymes: No results for input(s): CKTOTAL, CKMB, CKMBINDEX, TROPONINI in the last 168 hours.  HbA1C: Hgb A1c MFr Bld  Date/Time Value Ref Range Status  09/13/2016 09:35 AM 5.0 <5.7 % Final    Comment:      For the purpose of screening for the presence of diabetes:   <5.7%       Consistent with the absence of diabetes 5.7-6.4 %   Consistent with increased risk for diabetes (prediabetes) >=6.5 %     Consistent with diabetes   This assay result is consistent with a decreased risk of diabetes.   Currently, no consensus exists regarding use of hemoglobin A1c for diagnosis of diabetes in children.   According to American Diabetes Association (ADA) guidelines, hemoglobin A1c <7.0% represents optimal control in non-pregnant diabetic patients. Different metrics may apply to specific patient populations. Standards of Medical Care in Diabetes (ADA).     11/17/2015 04:26 AM 4.9 4.8 - 5.6 % Final    Comment:    (NOTE)         Pre-diabetes: 5.7 -  6.4         Diabetes: >6.4         Glycemic control for adults with diabetes: <7.0     CBG: Recent Labs  Lab 09/26/19 0137  GLUCAP 90    Review of Systems:   Unable to perform as patient is intubated  Past Medical History  He,  has a past medical history of Alcohol abuse, Candida esophagitis ([place]), Cardiomyopathy ([place]), CVA (cerebral infarction), Duodenal ulcer, Dysphagia, Headache(784.0), Hypertension, Prostate cancer ([place]) (12/2018), Severe protein-calorie malnutrition ([place]), and Stroke ([place]).   Surgical History    Past Surgical History:  Procedure Laterality Date  . BALLOON DILATION N/A 11/22/2015   Procedure: BALLOON DILATION;  Surgeon: Mauri Pole, MD;  Location: [facility] ENDOSCOPY;  Service: Endoscopy;  Laterality: N/A;  . CARDIAC CATHETERIZATION N/A 01/17/2016   Procedure: Left Heart Cath and Coronary Angiography;  Surgeon: Sherren Mocha, MD;  Location: [facility] CV LAB;  Service: Cardiovascular;  Laterality: N/A;  . DIRECT LARYNGOSCOPY  05/07/2011   Procedure: DIRECT LARYNGOSCOPY;  Surgeon: Rozetta Nunnery, MD;  Location: [facility];  Service: ENT;  Laterality: N/A;  ESOPHAGOSCOPY  WITH DILATION  . EP IMPLANTABLE DEVICE N/A 11/17/2015   Procedure: Loop Recorder Insertion;  Surgeon: Evans Lance, MD;  Location: [facility] CV LAB;  Service: Cardiovascular;  Laterality: N/A;  . ESOPHAGOGASTRODUODENOSCOPY  04/29/2011   Procedure: ESOPHAGOGASTRODUODENOSCOPY (EGD);  Surgeon: Winfield Cunas., MD;  Location: [facility];  Service: Endoscopy;  Laterality: N/A;  . ESOPHAGOGASTRODUODENOSCOPY N/A 11/22/2015   Procedure: ESOPHAGOGASTRODUODENOSCOPY (EGD);  Surgeon: Mauri Pole, MD;  Location: [facility] ENDOSCOPY;  Service: Endoscopy;  Laterality: N/A;  . LOOP RECORDER IMPLANT       Social History   reports that he has been smoking cigarettes. He has a 15.00 pack-year smoking history. He has never used smokeless tobacco. He reports current drug use. Drug: Marijuana. He reports  that he does not drink alcohol.   Family History   His family history includes Heart attack in his maternal grandfather. There is no history of Colon cancer, Breast cancer, or Prostate cancer.   Allergies No Known Allergies   Home Medications  Prior to Admission medications   Medication Sig Start Date End Date Taking? Authorizing Provider  amLODipine (NORVASC) 2.5 MG tablet Take 1 tablet (2.5 mg total) by mouth daily. 03/15/19   Shirley Friar, PA-C  apixaban (ELIQUIS) 5 MG TABS tablet Take 1 tablet (5 mg total) by mouth 2 (two) times daily. 03/15/19   Shirley Friar, PA-C  atorvastatin (LIPITOR) 40 MG tablet TAKE 1 TABLET BY MOUTH DAILY AT 6 PM. 03/15/19   Tillery, Satira Mccallum, PA-C  Blood Pressure Monitoring (BLOOD PRESSURE CUFF) MISC Check blood pressure daily between 8 pm -9 pm 09/13/16   [name], FNP  ferrous sulfate 325 (65 FE) MG tablet Take 1 tablet (325 mg total) by mouth daily. 08/09/18   Azzie Glatter, FNP  lisinopril (ZESTRIL) 40 MG tablet Take 1 tablet (40 mg total) by mouth daily. 03/15/19   Shirley Friar, PA-C  megestrol (MEGACE) 40 MG tablet TAKE 1 TABLET BY MOUTH DAILY. 12/07/18   Azzie Glatter, FNP  metoprolol tartrate (LOPRESSOR) 25 MG tablet Take 1 tablet (25 mg total) by mouth 2 (two) times daily. 03/15/19   Shirley Friar, PA-C  pantoprazole (PROTONIX) 40 MG tablet Take 1 tablet (40 mg total) by mouth 2 (two) times daily. 03/15/19   Shirley Friar, PA-C  potassium chloride SA (KLOR-CON) 20 MEQ tablet Take 2 tablets (40 mEq total) by mouth daily. 03/15/19   Shirley Friar, PA-C  spironolactone (ALDACTONE) 25 MG tablet Take 1 tablet (25 mg total) by mouth daily. 03/15/19   Shirley Friar, PA-C     Critical care time: 50 minutes       CRITICAL CARE Performed by: Otilio Carpen Shanti Agresti   Total critical care time: 50 minutes  Critical care time was exclusive of separately billable  procedures and treating other patients.  Critical care was necessary to treat or prevent imminent or life-threatening deterioration.  Critical care was time spent personally by me on the following activities: development of treatment plan with patient and/or surrogate as well as nursing, discussions with consultants, evaluation of patient's response to treatment, examination of patient, obtaining history from patient or surrogate, ordering and performing treatments and interventions, ordering and review of laboratory  studies, ordering and review of radiographic studies, pulse oximetry and re-evaluation of patient's condition.   Otilio Carpen Livier Hendel, PA-C

## 2019-09-27 ENCOUNTER — Inpatient Hospital Stay (HOSPITAL_COMMUNITY): Payer: Medicare Other

## 2019-09-27 DIAGNOSIS — I63511 Cerebral infarction due to unspecified occlusion or stenosis of right middle cerebral artery: Secondary | ICD-10-CM | POA: Diagnosis not present

## 2019-09-27 DIAGNOSIS — I6389 Other cerebral infarction: Secondary | ICD-10-CM

## 2019-09-27 DIAGNOSIS — Z8673 Personal history of transient ischemic attack (TIA), and cerebral infarction without residual deficits: Secondary | ICD-10-CM | POA: Diagnosis not present

## 2019-09-27 DIAGNOSIS — I63411 Cerebral infarction due to embolism of right middle cerebral artery: Secondary | ICD-10-CM | POA: Diagnosis not present

## 2019-09-27 DIAGNOSIS — I10 Essential (primary) hypertension: Secondary | ICD-10-CM

## 2019-09-27 DIAGNOSIS — I480 Paroxysmal atrial fibrillation: Secondary | ICD-10-CM | POA: Diagnosis not present

## 2019-09-27 LAB — GLUCOSE, CAPILLARY
Glucose-Capillary: 90 mg/dL (ref 70–99)
Glucose-Capillary: 107 mg/dL — ABNORMAL HIGH (ref 70–99)
Glucose-Capillary: 83 mg/dL (ref 70–99)
Glucose-Capillary: 85 mg/dL (ref 70–99)
Glucose-Capillary: 89 mg/dL (ref 70–99)

## 2019-09-27 LAB — ECHOCARDIOGRAM COMPLETE
Height: 71 in
Weight: 2649.05 oz

## 2019-09-27 LAB — FERRITIN: Ferritin: 9 ng/mL — ABNORMAL LOW (ref 24–336)

## 2019-09-27 LAB — BASIC METABOLIC PANEL
Anion gap: 11 (ref 5–15)
BUN: 6 mg/dL — ABNORMAL LOW (ref 8–23)
CO2: 23 mmol/L (ref 22–32)
Calcium: 8.6 mg/dL — ABNORMAL LOW (ref 8.9–10.3)
Chloride: 103 mmol/L (ref 98–111)
Creatinine, Ser: 0.78 mg/dL (ref 0.61–1.24)
GFR calc Af Amer: >60 mL/min (ref >60)
GFR calc non Af Amer: >60 mL/min (ref >60)
Glucose, Bld: 87 mg/dL (ref 70–99)
Potassium: 3.4 mmol/L — ABNORMAL LOW (ref 3.5–5.1)
Sodium: 137 mmol/L (ref 135–145)

## 2019-09-27 LAB — CBC
HCT: 32.6 % — ABNORMAL LOW (ref 39.0–52.0)
Hemoglobin: 10.2 g/dL — ABNORMAL LOW (ref 13.0–17.0)
MCH: 23.5 pg — ABNORMAL LOW (ref 26.0–34.0)
MCHC: 31.3 g/dL (ref 30.0–36.0)
MCV: 75.1 fL — ABNORMAL LOW (ref 80.0–100.0)
Platelets: 239 10*3/uL (ref 150–400)
RBC: 4.34 MIL/uL (ref 4.22–5.81)
RDW: 20.5 % — ABNORMAL HIGH (ref 11.5–15.5)
WBC: 7.1 10*3/uL (ref 4.0–10.5)
nRBC: 0 % (ref 0.0–0.2)

## 2019-09-27 LAB — IRON AND TIBC
Iron: 22 ug/dL — ABNORMAL LOW (ref 45–182)
Saturation Ratios: 5 % — ABNORMAL LOW (ref 17.9–39.5)
TIBC: 477 ug/dL — ABNORMAL HIGH (ref 250–450)
UIBC: 455 ug/dL

## 2019-09-27 MED ORDER — ENSURE ENLIVE PO LIQD
237.0000 mL | Freq: Two times a day (BID) | ORAL | Status: DC
  Administered 2019-09-28: 237 mL via ORAL

## 2019-09-27 MED ORDER — AMLODIPINE BESYLATE 10 MG PO TABS: 10.0000 mg | ORAL_TABLET | Freq: Every day | ORAL | Status: DC

## 2019-09-27 MED ORDER — LISINOPRIL 20 MG PO TABS
40.0000 mg | ORAL_TABLET | Freq: Every day | ORAL | Status: DC
  Administered 2019-09-27 – 2019-09-28 (×2): 40 mg via ORAL
  Filled 2019-09-27 (×2): qty 2

## 2019-09-27 MED ORDER — PNEUMOCOCCAL VAC POLYVALENT 25 MCG/0.5ML IJ INJ
0.5000 mL | INJECTION | INTRAMUSCULAR | Status: AC
  Administered 2019-09-28: 0.5 mL via INTRAMUSCULAR
  Filled 2019-09-27: qty 0.5

## 2019-09-27 NOTE — Evaluation (Signed)
Physical Therapy Evaluation Patient Details Name: Gregory Perry MRN: [id number] DOB: May 15, 1951 Today's Date: 09/27/2019   History of Present Illness  Gregory Perry is a 69 y.o. male with a history of cardiomyopathy, stroke, atrial fibrillation who presents with left-sided deficits that started around 3 PM. Pt found to have occluded R MCA M2 and underwent emergent thrombectomy va cerebral arteriogram. PMH: ETOH abuse, prostate cancer, stroke.    Clinical Impression  Pt admitted with above. Pt functioning at min guard level with noted L UE impaired sensation, co-ordination, and weakness. Pt with noted difficulty with navigation of hallway back to room during ambulation. Pt amb with min guard and was unsteady but no overt loss of balance. Pt reports his family will be home or someone can be there to provide 24/7 assist. Pt to greatly benefit from HHPT to then transition to outpatient PT for balance and strengthening to decrease falls risk. Acute PT to cont to follow.    Follow Up Recommendations Home health PT;Supervision/Assistance - 24 hour(must have 24/7 assist)    Equipment Recommendations  None recommended by PT    Recommendations for Other Services       Precautions / Restrictions Precautions Precautions: Fall Precaution Comments: R hemiopnopsia Restrictions Weight Bearing Restrictions: No      Mobility  Bed Mobility Overal bed mobility: Needs Assistance Bed Mobility: Supine to Sit     Supine to sit: Min assist     General bed mobility comments: minA for safety, HOB elevated  Transfers Overall transfer level: Needs assistance Equipment used: None Transfers: Sit to/from Stand Sit to Stand: Min assist         General transfer comment: minA for safety due to slighlty impulsive and for safety, mildly unsteady  Ambulation/Gait Ambulation/Gait assistance: Min assist;+2 safety/equipment(for lines) Gait Distance (Feet): 200 Feet Assistive device: None Gait  Pattern/deviations: Step-through pattern;Decreased stride length;Wide base of support Gait velocity: slow Gait velocity interpretation: 1.31 - 2.62 ft/sec, indicative of limited community ambulator General Gait Details: pt with lateral L and R swaying, vearing more R than left and brushing up against things on the R, pt with LOB with turning and doesn't pay attention to his surroundings  Stairs            Wheelchair Mobility    Modified Rankin (Stroke Patients Only) Modified Rankin (Stroke Patients Only) Pre-Morbid Rankin Score: Slight disability Modified Rankin: Moderate disability     Balance Overall balance assessment: Needs assistance Sitting-balance support: Feet supported;No upper extremity supported Sitting balance-Leahy Scale: Good     Standing balance support: No upper extremity supported Standing balance-Leahy Scale: Fair Standing balance comment: pt able to stand at sink and work with OT at sink without LOB but min guard for safety due to quick to move                             Pertinent Vitals/Pain Pain Assessment: No/denies pain    Home Living Family/patient expects to be discharged to:: Private residence Living Arrangements: (brother and sister) Available Help at Discharge: Family;Available 24 hours/day Type of Home: House Home Access: Stairs to enter Entrance Stairs-Rails: None Entrance Stairs-Number of Steps: 4 Home Layout: One level Home Equipment: None      Prior Function Level of Independence: Independent         Comments: retired Administrator, still Manufacturing engineer Dominance   Dominant Hand: Left    Extremity/Trunk Assessment  Upper Extremity Assessment Upper Extremity Assessment: Defer to OT evaluation    Lower Extremity Assessment Lower Extremity Assessment: LLE deficits/detail LLE Deficits / Details: pt with good overall strength however demo's impaired gross co-ordination LLE Coordination: decreased gross  motor    Cervical / Trunk Assessment Cervical / Trunk Assessment: Normal  Communication   Communication: No difficulties  Cognition Arousal/Alertness: Awake/alert Behavior During Therapy: WFL for tasks assessed/performed(mildly impulsive but also had to go to the bathroom) Overall Cognitive Status: Impaired/Different from baseline Area of Impairment: Problem solving;Safety/judgement                         Safety/Judgement: Decreased awareness of safety;Decreased awareness of deficits   Problem Solving: Difficulty sequencing;Requires verbal cues;Requires tactile cues General Comments: pt had difficulty navigating hallway and finding room      General Comments General comments (skin integrity, edema, etc.): VSS    Exercises     Assessment/Plan    PT Assessment Patient needs continued PT services  PT Problem List Decreased strength;Decreased range of motion;Decreased activity tolerance;Decreased balance;Decreased mobility;Decreased coordination;Decreased cognition;Decreased knowledge of use of DME       PT Treatment Interventions DME instruction;Gait training;Stair training;Functional mobility training;Therapeutic activities;Therapeutic exercise;Balance training;Neuromuscular re-education    PT Goals (Current goals can be found in the Care Plan section)  Acute Rehab PT Goals PT Goal Formulation: With patient Time For Goal Achievement: 10/11/19 Potential to Achieve Goals: Good Additional Goals Additional Goal #1: Pt to score >19 on DGI to demonstrate minimal fall risk.    Frequency Min 4X/week   Barriers to discharge        Co-evaluation PT/OT/SLP Co-Evaluation/Treatment: Yes Reason for Co-Treatment: Complexity of the patient's impairments (multi-system involvement) PT goals addressed during session: Mobility/safety with mobility         AM-PAC PT "6 Clicks" Mobility  Outcome Measure   Help needed moving from lying on your back to sitting on the side  of a flat bed without using bedrails?: A Little Help needed moving to and from a bed to a chair (including a wheelchair)?: A Little Help needed standing up from a chair using your arms (e.g., wheelchair or bedside chair)?: A Little Help needed to walk in hospital room?: A Little Help needed climbing 3-5 steps with a railing? : A Little 6 Click Score: 15    End of Session Equipment Utilized During Treatment: Gait belt Activity Tolerance: Patient tolerated treatment well Patient left: in chair;with call bell/phone within reach;with chair alarm set Nurse Communication: Mobility status PT Visit Diagnosis: Muscle weakness (generalized) (M62.81);Difficulty in walking, not elsewhere classified (R26.2)    Time: [id number] PT Time Calculation (min) (ACUTE ONLY): 28 min   Charges:   PT Evaluation $PT Eval Moderate Complexity: 1 Mod          Kittie Plater, PT, DPT Acute Rehabilitation Services Pager #: [phone] Office #: [phone]   Berline Lopes 09/27/2019, 3:34 PM

## 2019-09-27 NOTE — Progress Notes (Signed)
  Echocardiogram 2D Echocardiogram has been performed.  Gregory Perry 09/27/2019, 9:32 AM

## 2019-09-27 NOTE — Progress Notes (Signed)
STROKE TEAM PROGRESS NOTE   INTERVAL HISTORY Pt lying in bed, RN at bedside. He has been doing well. Off cleviprex now. On eliquis, tolerating well. PT/OT recommend HH.    OBJECTIVE Vitals:   09/27/19 0959 09/27/19 1000 09/27/19 1100 09/27/19 1200  BP: 135/63 123/79 130/68 126/87  Pulse: 67 (!) 56 (!) 56 (!) 55  Resp:  15 18 18   Temp:    98.5 F (36.9 C)  TempSrc:    Oral  SpO2:  99% 98% 97%  Weight:      Height:        CBC:  Recent Labs  Lab 09/26/19 0053 09/26/19 0110 09/26/19 0549 09/27/19 0615  WBC 6.8  --   --  7.1  HGB 10.9*   < > 11.2* 10.2*  HCT 36.6*   < > 33.0* 32.6*  MCV 77.7*  --   --  75.1*  PLT 343  --   --  239   < > = values in this interval not displayed.    Basic Metabolic Panel:  Recent Labs  Lab 09/26/19 0053 09/26/19 0053 09/26/19 0110 09/26/19 0110 09/26/19 0549 09/27/19 0615  NA 139   < > 142   < > 142 137  K 3.4*   < > 3.6   < > 4.0 3.4*  CL 104   < > 103  --   --  103  CO2 25  --   --   --   --  23  GLUCOSE 100*   < > 93  --   --  87  BUN 13   < > 14  --   --  6*  CREATININE 0.92   < > 0.90  --   --  0.78  CALCIUM 9.6  --   --   --   --  8.6*   < > = values in this interval not displayed.   Lipid Panel:     Component Value Date/Time   CHOL 192 09/26/2019 0624   CHOL 154 08/03/2018 0853   TRIG 53 09/26/2019 0624   TRIG 52 09/26/2019 0624   HDL 54 09/26/2019 0624   HDL 63 08/03/2018 0853   CHOLHDL 3.6 09/26/2019 0624   VLDL 11 09/26/2019 0624   LDLCALC 127 (H) 09/26/2019 0624   LDLCALC 81 08/03/2018 0853   HgbA1c:  Lab Results  Component Value Date   HGBA1C 5.5 09/26/2019   Urine Drug Screen:     Component Value Date/Time   LABOPIA NONE DETECTED 09/26/2019 1325   COCAINSCRNUR NONE DETECTED 09/26/2019 1325   LABBENZ NONE DETECTED 09/26/2019 1325   AMPHETMU NONE DETECTED 09/26/2019 1325   THCU POSITIVE (A) 09/26/2019 1325   LABBARB NONE DETECTED 09/26/2019 1325    Alcohol Level     Component Value Date/Time    ETH <10 09/26/2019 0056    IMAGING past 24h MR ANGIO HEAD WO CONTRAST  Result Date: 09/26/2019 CLINICAL DATA:  Stroke.  Right M2 thrombectomy 09/26/2019 EXAM: MRI HEAD WITHOUT CONTRAST MRA HEAD WITHOUT CONTRAST TECHNIQUE: Multiplanar, multiecho pulse sequences of the brain and surrounding structures were obtained without intravenous contrast. Angiographic images of the head were obtained using MRA technique without contrast. COMPARISON:  CTA and CT perfusion 09/26/2019 FINDINGS: MRI HEAD FINDINGS Brain: Small subcentimeter areas of acute infarct involving the right frontal white matter, right posterior insula, and right posterior parietal lobe. Chronic infarct right frontal parietal lobe. Chronic hemorrhagic infarct left occipital lobe. Moderate chronic microvascular ischemic changes in  the white matter. Chronic ischemic change in the thalamus and cerebellum bilaterally. Negative for mass lesion. Mild atrophy.  Negative for hydrocephalus. Vascular: Normal arterial flow voids Skull and upper cervical spine: No focal skeletal lesion. Sinuses/Orbits: Mild mucosal edema paranasal sinuses. Bilateral cataract extraction Other: None MRA HEAD FINDINGS Both vertebral arteries patent to the basilar. PICA not visualized. Large AICA patent bilaterally. Basilar patent with irregularity distally which is felt to be motion related. Right posterior cerebral artery patent with artifact in the proximal segment. Stenosis versus artifact proximal left PCA. Occlusion distal left PCA corresponding to chronic infarction. Atherosclerotic irregularity in the cavernous carotid bilaterally without significant stenosis. Mild stenosis proximal A1 segment bilaterally. Mild to moderate stenosis A2 segment bilaterally. Right M1 segment is widely patent. Previously noted occlusion of the right M2 branch is now patent. There is irregularity at the site of previous occlusion which may be due to plaque or residual clot. There is good distal flow.  Left middle cerebral artery patent without significant stenosis. Negative for aneurysm. IMPRESSION: 1. Small subcentimeter areas of acute infarct in the right frontal white matter, right posterior insula, and right posterior parietal lobe. 2. Chronic infarct right frontal parietal lobe. Chronic hemorrhagic left PCA infarct. 3. Right M2 segment which was previously occluded is now patent with mild residual irregularity at the previous occlusion site. Electronically Signed   By: Franchot Gallo M.D.   On: 09/26/2019 18:31   MR BRAIN WO CONTRAST  Result Date: 09/26/2019 CLINICAL DATA:  Stroke.  Right M2 thrombectomy 09/26/2019 EXAM: MRI HEAD WITHOUT CONTRAST MRA HEAD WITHOUT CONTRAST TECHNIQUE: Multiplanar, multiecho pulse sequences of the brain and surrounding structures were obtained without intravenous contrast. Angiographic images of the head were obtained using MRA technique without contrast. COMPARISON:  CTA and CT perfusion 09/26/2019 FINDINGS: MRI HEAD FINDINGS Brain: Small subcentimeter areas of acute infarct involving the right frontal white matter, right posterior insula, and right posterior parietal lobe. Chronic infarct right frontal parietal lobe. Chronic hemorrhagic infarct left occipital lobe. Moderate chronic microvascular ischemic changes in the white matter. Chronic ischemic change in the thalamus and cerebellum bilaterally. Negative for mass lesion. Mild atrophy.  Negative for hydrocephalus. Vascular: Normal arterial flow voids Skull and upper cervical spine: No focal skeletal lesion. Sinuses/Orbits: Mild mucosal edema paranasal sinuses. Bilateral cataract extraction Other: None MRA HEAD FINDINGS Both vertebral arteries patent to the basilar. PICA not visualized. Large AICA patent bilaterally. Basilar patent with irregularity distally which is felt to be motion related. Right posterior cerebral artery patent with artifact in the proximal segment. Stenosis versus artifact proximal left PCA.  Occlusion distal left PCA corresponding to chronic infarction. Atherosclerotic irregularity in the cavernous carotid bilaterally without significant stenosis. Mild stenosis proximal A1 segment bilaterally. Mild to moderate stenosis A2 segment bilaterally. Right M1 segment is widely patent. Previously noted occlusion of the right M2 branch is now patent. There is irregularity at the site of previous occlusion which may be due to plaque or residual clot. There is good distal flow. Left middle cerebral artery patent without significant stenosis. Negative for aneurysm. IMPRESSION: 1. Small subcentimeter areas of acute infarct in the right frontal white matter, right posterior insula, and right posterior parietal lobe. 2. Chronic infarct right frontal parietal lobe. Chronic hemorrhagic left PCA infarct. 3. Right M2 segment which was previously occluded is now patent with mild residual irregularity at the previous occlusion site. Electronically Signed   By: Franchot Gallo M.D.   On: 09/26/2019 18:31   ECHOCARDIOGRAM COMPLETE  Result  Date: 09/27/2019    ECHOCARDIOGRAM REPORT   Patient Name:   JEANCARLO TOURAY Date of Exam: 09/27/2019 Medical Rec #:  [id number]         Height:       71.0 in Accession #:    [id number]        Weight:       165.6 lb Date of Birth:  09-10-1950          BSA:          1.946 m Patient Age:    8 years          BP:           114/69 mmHg Patient Gender: M                 HR:           61 bpm. Exam Location:  Inpatient Procedure: 2D Echo Indications:    stroke 434.91  History:        Patient has prior history of Echocardiogram examinations, most                 recent 05/15/2017.  Sonographer:    Johny Chess Referring Phys: [email or web address].Osler MCNEILL P [place]  1. Left ventricular ejection fraction, by estimation, is 40 to 45%. The left ventricle has mildly decreased function. The left ventricle demonstrates global hypokinesis. There is mild left ventricular hypertrophy. Left ventricular  diastolic parameters are consistent with Grade II diastolic dysfunction (pseudonormalization).  2. Right ventricular systolic function is normal. The right ventricular size is normal. There is normal pulmonary artery systolic pressure.  3. Left atrial size was mildly dilated.  4. The mitral valve is normal in structure. Trivial mitral valve regurgitation. No evidence of mitral stenosis.  5. The aortic valve is normal in structure. Aortic valve regurgitation is not visualized. No aortic stenosis is present.  6. The inferior vena cava is normal in size with greater than 50% respiratory variability, suggesting right atrial pressure of 3 mmHg. Comparison(s): The left ventricular hypertrophy has improved. Conclusion(s)/Recommendation(s): No intracardiac source of embolism detected on this transthoracic study. A transesophageal echocardiogram is recommended to exclude cardiac source of embolism if clinically indicated. FINDINGS  Left Ventricle: Left ventricular ejection fraction, by estimation, is 40 to 45%. The left ventricle has mildly decreased function. The left ventricle demonstrates global hypokinesis. The left ventricular internal cavity size was normal in size. There is  mild left ventricular hypertrophy. Left ventricular diastolic parameters are consistent with Grade II diastolic dysfunction (pseudonormalization). Right Ventricle: The right ventricular size is normal. No increase in right ventricular wall thickness. Right ventricular systolic function is normal. There is normal pulmonary artery systolic pressure. The tricuspid regurgitant velocity is 2.29 m/s, and  with an assumed right atrial pressure of 3 mmHg, the estimated right ventricular systolic pressure is 0000000 mmHg. Left Atrium: Left atrial size was mildly dilated. Right Atrium: Right atrial size was normal in size. Pericardium: There is no evidence of pericardial effusion. Mitral Valve: The mitral valve is normal in structure. Normal mobility of the  mitral valve leaflets. Trivial mitral valve regurgitation. No evidence of mitral valve stenosis. Tricuspid Valve: The tricuspid valve is normal in structure. Tricuspid valve regurgitation is trivial. No evidence of tricuspid stenosis. Aortic Valve: The aortic valve is normal in structure. Aortic valve regurgitation is not visualized. No aortic stenosis is present. Pulmonic Valve: The pulmonic valve was normal in structure. Pulmonic valve regurgitation is trivial. No evidence of pulmonic stenosis.  Aorta: The aortic root is normal in size and structure. Venous: The inferior vena cava is normal in size with greater than 50% respiratory variability, suggesting right atrial pressure of 3 mmHg. IAS/Shunts: No atrial level shunt detected by color flow Doppler.  LEFT VENTRICLE PLAX 2D LVIDd:         5.80 cm  Diastology LVIDs:         4.30 cm  LV e' lateral:   8.27 cm/s LV PW:         1.20 cm  LV E/e' lateral: 13.1 LV IVS:        1.10 cm  LV e' medial:    6.53 cm/s LVOT diam:     2.10 cm  LV E/e' medial:  16.5 LV SV:         94 LV SV Index:   48 LVOT Area:     3.46 cm  RIGHT VENTRICLE             IVC RV S prime:     12.70 cm/s  IVC diam: 1.90 cm TAPSE (M-mode): 2.5 cm LEFT ATRIUM             Index       RIGHT ATRIUM           Index LA diam:        4.10 cm 2.11 cm/m  RA Area:     16.80 cm LA Vol (A2C):   78.5 ml 40.34 ml/m RA Volume:   42.70 ml  21.94 ml/m LA Vol (A4C):   76.0 ml 39.06 ml/m LA Biplane Vol: 83.3 ml 42.81 ml/m  AORTIC VALVE LVOT Vmax:   133.00 cm/s LVOT Vmean:  94.500 cm/s LVOT VTI:    0.272 m  AORTA Ao Root diam: 3.30 cm MITRAL VALVE                TRICUSPID VALVE MV Area (PHT): 3.65 cm     TR Peak grad:   21.0 mmHg MV Decel Time: 208 msec     TR Vmax:        229.00 cm/s MV E velocity: 108.00 cm/s MV A velocity: 91.80 cm/s   SHUNTS MV E/A ratio:  1.18         Systemic VTI:  0.27 m                             Systemic Diam: 2.10 cm Candee Furbish MD Electronically signed by Candee Furbish MD Signature  Date/Time: 09/27/2019/11:11:12 AM    Final      PHYSICAL EXAM  Temp:  [98.2 F (36.8 C)-98.6 F (37 C)] 98.5 F (36.9 C) (05/03 1200) Pulse Rate:  [49-108] 55 (05/03 1200) Resp:  [11-23] 18 (05/03 1200) BP: (100-138)/(56-87) 126/87 (05/03 1200) SpO2:  [90 %-99 %] 97 % (05/03 1200) Arterial Line BP: (87-157)/(49-81) 147/81 (05/03 1200)  General - Well nourished, well developed, in no apparent distress.  Ophthalmologic - fundi not visualized due to noncooperation.  Cardiovascular - Regular rhythm and rate, not in afib.  Neuro - awake alert, no aphasia, normal fluent speech but mild dysarthria. Following commands well. Chronic right hemianopia. No gaze deviation. PERRL. EOMI. No facial asymmetry. Tongue midline. BUEs strength 5/5 equally. LLE 5-/5 and RLE 5/5. LUE decreased light touch sensation comparing with right. DTR 1+ and no babinski. Intact FTN bilaterally. Gait not tested.   ASSESSMENT/PLAN Gregory Perry is a 69 y.o. male with  history of cardiomyopathy, prostate cancer, malnutrition, tobacco use, ETOH abuse, marijuana use, stroke, s/p loop implant, atrial fibrillation (noncompliant with his medications including his Eliquis for about 3 months) who presents with left-sided deficits that started around 3 PM. He did not receive IV t-PA due to late presentation (>4.5 hours from time of onset).   Stroke: Rt MCA territory stroke due to right M2 occlusion s/p IR with TICI3 - embolic due to PAF not on [facility]  Code Stroke CT Head - No acute intracranial abnormality. ASPECTS is 10. Multiple old infarcts and chronic ischemic microangiopathy.   CTA H&N - Occlusion of the right MCA distal M2 segment, corresponding to the region of ischemia in the posterior right MCA territory. Chronic left M2 occlusion.   CT Perfusion - 36 mL area at risk ischemic brain according to perfusion analysis.  IR - right MCA/M2 occlusion with TICI-3 flow  MRI / MRA head - small R frontal lobe white matter, R  posterior insula, R posterior parietal lobe tiny infarcts. Old R frontoparietal infarct. Chronic hemorrhagic L PCA infarct. R M2 patent (previous occlusion w/ residual irregularity)  2D Echo - EF 40-45%. No source of embolus   [employer] Virus 2 - negative  LDL - 127  HgbA1c 5.5  UDS + for THC  VTE prophylaxis - SCDs  No antithrombotics prior to admission, treated w/  aspirin 300 mg suppository daily -> resumed eliquis as MRI no large infarct or hemorrhagic conversion.   Therapy recommendations:  HH PT    Disposition:  Pending  PAF  Loop recorder detected paroxysmal A. Fib  Follow with cardiology Dr. Lovena Le  On Eliquis, however patient noncompliant  Rate controlled  Resumed Eliquis as MRI no large infarct or hemorrhagic conversion.  History of stroke  10/2015 admitted for left acute PCA infarcts.  MRI also showed chronic right parietal small infarcts.  MRA showed left PCA severe stenosis, right PCA moderate stenosis.  CTA neck negative.  EF 35 to 40%.  Loop recorder placed.  LDL 136 and A1c 4.9.  Patient discharged on aspirin and Lipitor  Laser loop recorder showed paroxysmal A. fib, patient put on Eliquis  However patient noncompliant with medication and also lost follow-up.  Hypertension  Home BP meds: Zestril, Norvasc, Spironolactone, metoprolol  Current BP meds: zestril, spironolactone, metoprolol     BP goal 120-140 post IR . Treated with cleviprex, now off . BP goal < 180 now, stable . Long-term BP goal normotensive  Hyperlipidemia  Home Lipid lowering medication: None  LDL 127, goal < 70  Current lipid lowering medication: Lipitor 40  Continue statin at discharge  Tobacco abuse  Current smoker  Smoking cessation counseling provided  Pt is willing to quit  Iron deficiency anemia  Hb -12.6->11.2->10.2  Iron 22, ferritin 9  On iron pills  Other Stroke Risk Factors  Advanced age  Hx of ETOH abuse  Substance Abuse - marijuana -  sessation education provided  Other Active Problems  Code status - Full code  Bradycardia - 50s-60s  Hospital day # 1  This patient is critically ill due to right MCA stroke, right M2 occlusion, status post IR, paroxysmal A. fib, hypertensive urgency and at significant risk of neurological worsening, death form recurrent stroke, hemorrhagic conversion, heart failure, seizure. This patient's care requires constant monitoring of vital signs, hemodynamics, respiratory and cardiac monitoring, review of multiple databases, neurological assessment, discussion with family, other specialists and medical decision making of high complexity. I spent 35 minutes of  neurocritical care time in the care of this patient.  Rosalin Hawking, MD PhD Stroke Neurology 09/27/2019 3:43 PM   To contact Stroke Continuity provider, please refer to [email or web address] After hours, contact General Neurology

## 2019-09-27 NOTE — Progress Notes (Signed)
Occupational Therapy Evaluation Patient Details Name: Gregory Perry MRN: [id number] DOB: 1951/05/12 Today's Date: 09/27/2019    History of Present Illness Gregory Perry is a 69 y.o. male with a history of cardiomyopathy, stroke, atrial fibrillation who presents with left-sided deficits that started around 3 PM. Pt found to have occluded R MCA M2 and underwent emergent thrombectomy va cerebral arteriogram. PMH: ETOH abuse, prostate cancer, stroke.   Clinical Impression   PTA, pt independent with ADL and mobility and was driving, although he has a chronic R hemianopia. Pt able to complete ADL tasks with min-guard A and S for safety due to deficits listed below. Using L hand functionally, although clumsy, dropping items and demonstrates decreased sensation and poor in-hand manipulation skills. VSS. Recommend DC home with HHOT and initial 24/7 S and S with all medication management. (Pt states he had stopped taking all medications PTA.) will follow acutely.     Follow Up Recommendations  Home health OT;Supervision/Assistance - 24 hour(initially; refrain from driving)    Equipment Recommendations  3 in 1 bedside commode(to use as shower seat)    Recommendations for Other Services       Precautions / Restrictions Precautions Precautions: Fall Precaution Comments: R hemiopnopsia Restrictions Weight Bearing Restrictions: No      Mobility Bed Mobility Overal bed mobility: Needs Assistance Bed Mobility: Supine to Sit     Supine to sit: Supervision        Transfers Overall transfer level: Needs assistance Equipment used: None Transfers: Sit to/from Stand Sit to Stand: Min guard              Balance Overall balance assessment: Needs assistance Sitting-balance support: Feet supported;No upper extremity supported Sitting balance-Leahy Scale: Good     Standing balance support: No upper extremity supported Standing balance-Leahy Scale: Fair Standing balance comment:  pt able to stand at sink and work with OT at sink without LOB but min guard for safety due to quick to move                           ADL either performed or assessed with clinical judgement   ADL Overall ADL's : Needs assistance/impaired Eating/Feeding: Modified independent   Grooming: Wash/dry hands;Wash/dry face;Oral care;Min guard;Standing   Upper Body Bathing: Min guard;Standing   Lower Body Bathing: Min guard;Sit to/from stand   Upper Body Dressing : Set up;Supervision/safety;Sitting   Lower Body Dressing: Min guard;Sit to/from stand   Toilet Transfer: Minimal assistance;Ambulation   Toileting- Clothing Manipulation and Hygiene: Min guard;Sit to/from stand       Functional mobility during ADLs: Min guard;Cueing for safety       Vision   Additional Comments: R hemianopia at baseline; unable to detect movement in R field both eyes until in central visual field     Perception Perception Perception Tested?: (appears intact)   Praxis Praxis Praxis tested?: Within functional limits    Pertinent Vitals/Pain Pain Assessment: No/denies pain     Hand Dominance Left   Extremity/Trunk Assessment Upper Extremity Assessment Upper Extremity Assessment: LUE deficits/detail LUE Deficits / Details: using hand functionally but demonstrates L "clumsy hand" - dropping items; decreased sensation; poor stereognosis;; decreased in-hand manipulation skills LUE Sensation: decreased light touch LUE Coordination: decreased fine motor   Lower Extremity Assessment Lower Extremity Assessment: Defer to PT evaluation   Cervical / Trunk Assessment Cervical / Trunk Assessment: Normal   Communication Communication Communication: No difficulties   Cognition Arousal/Alertness:  Awake/alert Behavior During Therapy: WFL for tasks assessed/performed(mildly impulsive but also had to go to the bathroom) Overall Cognitive Status: Impaired/Different from baseline Area of Impairment:  Problem solving;Safety/judgement;Attention;Awareness                   Current Attention Level: Selective     Safety/Judgement: Decreased awareness of safety;Decreased awareness of deficits Awareness: Emergent Problem Solving: Difficulty sequencing;Requires verbal cues;Requires tactile cues General Comments: pt had difficulty navigating hallway and finding room  Most likely baseline cognitive deficits as he was driving with a dense R field cut and quit taking all meds   General Comments  VSS    Exercises Exercises: Other exercises Other Exercises Other Exercises: in-hand manipulation activities Other Exercises: cup stacking Other Exercises: fine motor activities - picking up pennies and pushing through slot in cup   Shoulder Instructions      Home Living Family/patient expects to be discharged to:: Private residence Living Arrangements: (brother and sister) Available Help at Discharge: Family;Available 24 hours/day Type of Home: House Home Access: Stairs to enter [employer] of Steps: 4 Entrance Stairs-Rails: None Home Layout: One level     Bathroom Shower/Tub: Occupational psychologist: Standard Bathroom Accessibility: Yes How Accessible: Accessible via walker Home Equipment: None          Prior Functioning/Environment Level of Independence: Independent        Comments: retired Administrator, still drives        OT Problem List: Decreased strength;Impaired balance (sitting and/or standing);Impaired vision/perception;Decreased coordination;Decreased safety awareness;Decreased knowledge of use of DME or AE;Impaired sensation;Impaired UE functional use      OT Treatment/Interventions: Self-care/ADL training;Therapeutic exercise;Neuromuscular education;DME and/or AE instruction;Therapeutic activities;Cognitive remediation/compensation;Visual/perceptual remediation/compensation;Patient/family education;Balance training    OT Goals(Current  goals can be found in the care plan section) Acute Rehab OT Goals Patient Stated Goal: to go home OT Goal Formulation: With patient Time For Goal Achievement: 10/11/19 Potential to Achieve Goals: Good  OT Frequency: Min 2X/week   Barriers to D/C:            Co-evaluation PT/OT/SLP Co-Evaluation/Treatment: Yes Reason for Co-Treatment: Complexity of the patient's impairments (multi-system involvement);For patient/therapist safety   OT goals addressed during session: ADL's and self-care      AM-PAC OT "6 Clicks" Daily Activity     Outcome Measure Help from another person eating meals?: None Help from another person taking care of personal grooming?: A Little Help from another person toileting, which includes using toliet, bedpan, or urinal?: A Little Help from another person bathing (including washing, rinsing, drying)?: A Little Help from another person to put on and taking off regular upper body clothing?: A Little Help from another person to put on and taking off regular lower body clothing?: A Little 6 Click Score: 19   End of Session Equipment Utilized During Treatment: Gait belt Nurse Communication: Mobility status  Activity Tolerance: Patient tolerated treatment well Patient left: in chair;with call bell/phone within reach;with chair alarm set  OT Visit Diagnosis: Unsteadiness on feet (R26.81);Muscle weakness (generalized) (M62.81);Other symptoms and signs involving cognitive function                Time: [id number] OT Time Calculation (min): 43 min Charges:  OT General Charges $OT Visit: 1 Visit OT Evaluation $OT Eval Moderate Complexity: 1 Mod OT Treatments $Self Care/Home Management : 8-22 mins  Maurie Boettcher, OT/L   Acute OT Clinical Specialist Acute Rehabilitation Services Pager [phone] Office [phone]   [facility] 09/27/2019, 7:03  PM

## 2019-09-27 NOTE — Progress Notes (Addendum)
Patient arrived to 3W17 from ICU. Alert and oriented x 4. Plan of care provided to patient. Call bell within reach.

## 2019-09-27 NOTE — Progress Notes (Signed)
Referring Physician(s): Code Stroke- Greta Doom  Supervising Physician: Corrie Mckusick  Patient Status:  [facility] - In-pt  Chief Complaint: None  Subjective:  History of acute CVA s/p cerebral arteriogram with emergent mechanical thrombectomy of right MCA M2 segment occlusion achieving a TICI 3 revascularization 09/26/2019 by Dr. Earleen Newport. Patient awake and alert laying in bed with no complaints at this time. Follows simple commands. Moving all 4s, Right groin incision c/d/i.   Allergies: Patient has no known allergies.  Medications: Prior to Admission medications   Medication Sig Start Date End Date Taking? Authorizing Provider  amLODipine (NORVASC) 2.5 MG tablet Take 1 tablet (2.5 mg total) by mouth daily. Patient not taking: Reported on 09/26/2019 03/15/19   Shirley Friar, PA-C  apixaban (ELIQUIS) 5 MG TABS tablet Take 1 tablet (5 mg total) by mouth 2 (two) times daily. Patient not taking: Reported on 09/26/2019 03/15/19   Shirley Friar, PA-C  atorvastatin (LIPITOR) 40 MG tablet TAKE 1 TABLET BY MOUTH DAILY AT 6 PM. Patient not taking: Reported on 09/26/2019 03/15/19   Shirley Friar, PA-C  Blood Pressure Monitoring (BLOOD PRESSURE CUFF) MISC Check blood pressure daily between 8 pm -9 pm Patient not taking: Reported on 09/26/2019 09/13/16   [name], FNP  ferrous sulfate 325 (65 FE) MG tablet Take 1 tablet (325 mg total) by mouth daily. Patient not taking: Reported on 09/26/2019 08/09/18   Azzie Glatter, FNP  lisinopril (ZESTRIL) 40 MG tablet Take 1 tablet (40 mg total) by mouth daily. Patient not taking: Reported on 09/26/2019 03/15/19   Shirley Friar, PA-C  megestrol (MEGACE) 40 MG tablet TAKE 1 TABLET BY MOUTH DAILY. Patient not taking: Reported on 09/26/2019 12/07/18   Azzie Glatter, FNP  metoprolol tartrate (LOPRESSOR) 25 MG tablet Take 1 tablet (25 mg total) by mouth 2 (two) times daily. Patient not taking: Reported on  09/26/2019 03/15/19   Shirley Friar, PA-C  pantoprazole (PROTONIX) 40 MG tablet Take 1 tablet (40 mg total) by mouth 2 (two) times daily. Patient not taking: Reported on 09/26/2019 03/15/19   Shirley Friar, PA-C  potassium chloride SA (KLOR-CON) 20 MEQ tablet Take 2 tablets (40 mEq total) by mouth daily. Patient not taking: Reported on 09/26/2019 03/15/19   Shirley Friar, PA-C  spironolactone (ALDACTONE) 25 MG tablet Take 1 tablet (25 mg total) by mouth daily. Patient not taking: Reported on 09/26/2019 03/15/19   Shirley Friar, PA-C     Vital Signs: BP 123/79    Pulse (!) 56    Temp 98.2 F (36.8 C) (Oral)    Resp 15    Ht 5\' 11"  (1.803 m)    Wt 165 lb 9.1 oz (75.1 kg)    SpO2 99%    BMI 23.09 kg/m   Physical Exam Vitals and nursing note reviewed.  Constitutional:      General: He is not in acute distress.    Appearance: Normal appearance.  Pulmonary:     Effort: Pulmonary effort is normal. No respiratory distress.  Skin:    General: Skin is warm and dry.     Comments: Right groin incision soft without active bleeding or hematoma.  Neurological:     Mental Status: He is alert.     Comments: Alert, awake, and oriented x3. Speech and comprehension intact. PERRL bilaterally. No facial asymmetry. Tongue midline. Can spontaneously move all extremities. No pronator drift. Fine motor and coordination intact and symmetric. Distal pulses (DPs)  2+ bilaterally.     Imaging: CT Code Stroke CTA Head W/WO contrast  Result Date: 09/26/2019 CLINICAL DATA:  Right-sided gaze and neglect. EXAM: CT ANGIOGRAPHY HEAD AND NECK CT PERFUSION BRAIN TECHNIQUE: Multidetector CT imaging of the head and neck was performed using the standard protocol during bolus administration of intravenous contrast. Multiplanar CT image reconstructions and MIPs were obtained to evaluate the vascular anatomy. Carotid stenosis measurements (when applicable) are obtained utilizing NASCET  criteria, using the distal internal carotid diameter as the denominator. Multiphase CT imaging of the brain was performed following IV bolus contrast injection. Subsequent parametric perfusion maps were calculated using RAPID software. CONTRAST:  14mL OMNIPAQUE IOHEXOL 350 MG/ML SOLN COMPARISON:  Head CT 09/26/2019 CTA neck 11/17/2015 FINDINGS: CTA NECK FINDINGS SKELETON: There is no bony spinal canal stenosis. No lytic or blastic lesion. OTHER NECK: Normal pharynx, larynx and major salivary glands. No cervical lymphadenopathy. Unremarkable thyroid gland. UPPER CHEST: No pneumothorax or pleural effusion. No nodules or masses. AORTIC ARCH: There is no calcific atherosclerosis of the aortic arch. There is no aneurysm, dissection or hemodynamically significant stenosis of the visualized portion of the aorta. Conventional 3 vessel aortic branching pattern. The visualized proximal subclavian arteries are widely patent. RIGHT CAROTID SYSTEM: No dissection, occlusion or aneurysm. Mild atherosclerotic calcification at the carotid bifurcation without hemodynamically significant stenosis. LEFT CAROTID SYSTEM: No dissection, occlusion or aneurysm. Mild atherosclerotic calcification at the carotid bifurcation without hemodynamically significant stenosis. VERTEBRAL ARTERIES: Left dominant configuration. Both origins are clearly patent. There is no dissection, occlusion or flow-limiting stenosis to the skull base (V1-V3 segments). CTA HEAD FINDINGS POSTERIOR CIRCULATION: --Vertebral arteries: Normal V4 segments. --Posterior inferior cerebellar arteries (PICA): Patent origins from the vertebral arteries. --Anterior inferior cerebellar arteries (AICA): Patent origins from the basilar artery. --Basilar artery: Normal. --Superior cerebellar arteries: Normal. --Posterior cerebral arteries: Normal. Both originate from the basilar artery. Posterior communicating arteries (p-comm) are diminutive or absent. ANTERIOR CIRCULATION:  --Intracranial internal carotid arteries: Normal. --Anterior cerebral arteries (ACA): Normal. Both A1 segments are present. Patent anterior communicating artery (a-comm). --Middle cerebral arteries (MCA): Occlusion of the right MCA distal M2 segment corresponding to the region of ischemia identified below. Left M2 occlusion is favored to be chronic. VENOUS SINUSES: As permitted by contrast timing, patent. ANATOMIC VARIANTS: None Review of the MIP images confirms the above findings. CT Brain Perfusion Findings: ASPECTS: 10 CBF (<30%) Volume: 14mL Perfusion (Tmax>6.0s) volume: 52mL Mismatch Volume: 62mL Infarction Location:The identified region of core infarct corresponds to a chronic infarct in the right frontal lobe. IMPRESSION: 1. Occlusion of the right MCA distal M2 segment, corresponding to the region of ischemia in the posterior right MCA territory. 2. Chronic left M2 occlusion. 3. 36 mL area at risk ischemic brain according to perfusion analysis. The calculated core infarct is artifactual and relates to a remote right frontal lobe infarct. Critical Value/emergent results were called by telephone at the time of interpretation on 09/26/2019 at 1:46 am to provider MCNEILL [facility] , who verbally acknowledged these results. Electronically Signed   By: Ulyses Jarred M.D.   On: 09/26/2019 01:46   CT Code Stroke CTA Neck W/WO contrast  Result Date: 09/26/2019 CLINICAL DATA:  Right-sided gaze and neglect. EXAM: CT ANGIOGRAPHY HEAD AND NECK CT PERFUSION BRAIN TECHNIQUE: Multidetector CT imaging of the head and neck was performed using the standard protocol during bolus administration of intravenous contrast. Multiplanar CT image reconstructions and MIPs were obtained to evaluate the vascular anatomy. Carotid stenosis measurements (when applicable) are obtained utilizing  NASCET criteria, using the distal internal carotid diameter as the denominator. Multiphase CT imaging of the brain was performed following IV bolus  contrast injection. Subsequent parametric perfusion maps were calculated using RAPID software. CONTRAST:  152mL OMNIPAQUE IOHEXOL 350 MG/ML SOLN COMPARISON:  Head CT 09/26/2019 CTA neck 11/17/2015 FINDINGS: CTA NECK FINDINGS SKELETON: There is no bony spinal canal stenosis. No lytic or blastic lesion. OTHER NECK: Normal pharynx, larynx and major salivary glands. No cervical lymphadenopathy. Unremarkable thyroid gland. UPPER CHEST: No pneumothorax or pleural effusion. No nodules or masses. AORTIC ARCH: There is no calcific atherosclerosis of the aortic arch. There is no aneurysm, dissection or hemodynamically significant stenosis of the visualized portion of the aorta. Conventional 3 vessel aortic branching pattern. The visualized proximal subclavian arteries are widely patent. RIGHT CAROTID SYSTEM: No dissection, occlusion or aneurysm. Mild atherosclerotic calcification at the carotid bifurcation without hemodynamically significant stenosis. LEFT CAROTID SYSTEM: No dissection, occlusion or aneurysm. Mild atherosclerotic calcification at the carotid bifurcation without hemodynamically significant stenosis. VERTEBRAL ARTERIES: Left dominant configuration. Both origins are clearly patent. There is no dissection, occlusion or flow-limiting stenosis to the skull base (V1-V3 segments). CTA HEAD FINDINGS POSTERIOR CIRCULATION: --Vertebral arteries: Normal V4 segments. --Posterior inferior cerebellar arteries (PICA): Patent origins from the vertebral arteries. --Anterior inferior cerebellar arteries (AICA): Patent origins from the basilar artery. --Basilar artery: Normal. --Superior cerebellar arteries: Normal. --Posterior cerebral arteries: Normal. Both originate from the basilar artery. Posterior communicating arteries (p-comm) are diminutive or absent. ANTERIOR CIRCULATION: --Intracranial internal carotid arteries: Normal. --Anterior cerebral arteries (ACA): Normal. Both A1 segments are present. Patent anterior  communicating artery (a-comm). --Middle cerebral arteries (MCA): Occlusion of the right MCA distal M2 segment corresponding to the region of ischemia identified below. Left M2 occlusion is favored to be chronic. VENOUS SINUSES: As permitted by contrast timing, patent. ANATOMIC VARIANTS: None Review of the MIP images confirms the above findings. CT Brain Perfusion Findings: ASPECTS: 10 CBF (<30%) Volume: 67mL Perfusion (Tmax>6.0s) volume: 78mL Mismatch Volume: 74mL Infarction Location:The identified region of core infarct corresponds to a chronic infarct in the right frontal lobe. IMPRESSION: 1. Occlusion of the right MCA distal M2 segment, corresponding to the region of ischemia in the posterior right MCA territory. 2. Chronic left M2 occlusion. 3. 36 mL area at risk ischemic brain according to perfusion analysis. The calculated core infarct is artifactual and relates to a remote right frontal lobe infarct. Critical Value/emergent results were called by telephone at the time of interpretation on 09/26/2019 at 1:46 am to provider MCNEILL [facility] , who verbally acknowledged these results. Electronically Signed   By: Ulyses Jarred M.D.   On: 09/26/2019 01:46   MR ANGIO HEAD WO CONTRAST  Result Date: 09/26/2019 CLINICAL DATA:  Stroke.  Right M2 thrombectomy 09/26/2019 EXAM: MRI HEAD WITHOUT CONTRAST MRA HEAD WITHOUT CONTRAST TECHNIQUE: Multiplanar, multiecho pulse sequences of the brain and surrounding structures were obtained without intravenous contrast. Angiographic images of the head were obtained using MRA technique without contrast. COMPARISON:  CTA and CT perfusion 09/26/2019 FINDINGS: MRI HEAD FINDINGS Brain: Small subcentimeter areas of acute infarct involving the right frontal white matter, right posterior insula, and right posterior parietal lobe. Chronic infarct right frontal parietal lobe. Chronic hemorrhagic infarct left occipital lobe. Moderate chronic microvascular ischemic changes in the white  matter. Chronic ischemic change in the thalamus and cerebellum bilaterally. Negative for mass lesion. Mild atrophy.  Negative for hydrocephalus. Vascular: Normal arterial flow voids Skull and upper cervical spine: No focal skeletal lesion. Sinuses/Orbits: Mild mucosal  edema paranasal sinuses. Bilateral cataract extraction Other: None MRA HEAD FINDINGS Both vertebral arteries patent to the basilar. PICA not visualized. Large AICA patent bilaterally. Basilar patent with irregularity distally which is felt to be motion related. Right posterior cerebral artery patent with artifact in the proximal segment. Stenosis versus artifact proximal left PCA. Occlusion distal left PCA corresponding to chronic infarction. Atherosclerotic irregularity in the cavernous carotid bilaterally without significant stenosis. Mild stenosis proximal A1 segment bilaterally. Mild to moderate stenosis A2 segment bilaterally. Right M1 segment is widely patent. Previously noted occlusion of the right M2 branch is now patent. There is irregularity at the site of previous occlusion which may be due to plaque or residual clot. There is good distal flow. Left middle cerebral artery patent without significant stenosis. Negative for aneurysm. IMPRESSION: 1. Small subcentimeter areas of acute infarct in the right frontal white matter, right posterior insula, and right posterior parietal lobe. 2. Chronic infarct right frontal parietal lobe. Chronic hemorrhagic left PCA infarct. 3. Right M2 segment which was previously occluded is now patent with mild residual irregularity at the previous occlusion site. Electronically Signed   By: Franchot Gallo M.D.   On: 09/26/2019 18:31   MR BRAIN WO CONTRAST  Result Date: 09/26/2019 CLINICAL DATA:  Stroke.  Right M2 thrombectomy 09/26/2019 EXAM: MRI HEAD WITHOUT CONTRAST MRA HEAD WITHOUT CONTRAST TECHNIQUE: Multiplanar, multiecho pulse sequences of the brain and surrounding structures were obtained without  intravenous contrast. Angiographic images of the head were obtained using MRA technique without contrast. COMPARISON:  CTA and CT perfusion 09/26/2019 FINDINGS: MRI HEAD FINDINGS Brain: Small subcentimeter areas of acute infarct involving the right frontal white matter, right posterior insula, and right posterior parietal lobe. Chronic infarct right frontal parietal lobe. Chronic hemorrhagic infarct left occipital lobe. Moderate chronic microvascular ischemic changes in the white matter. Chronic ischemic change in the thalamus and cerebellum bilaterally. Negative for mass lesion. Mild atrophy.  Negative for hydrocephalus. Vascular: Normal arterial flow voids Skull and upper cervical spine: No focal skeletal lesion. Sinuses/Orbits: Mild mucosal edema paranasal sinuses. Bilateral cataract extraction Other: None MRA HEAD FINDINGS Both vertebral arteries patent to the basilar. PICA not visualized. Large AICA patent bilaterally. Basilar patent with irregularity distally which is felt to be motion related. Right posterior cerebral artery patent with artifact in the proximal segment. Stenosis versus artifact proximal left PCA. Occlusion distal left PCA corresponding to chronic infarction. Atherosclerotic irregularity in the cavernous carotid bilaterally without significant stenosis. Mild stenosis proximal A1 segment bilaterally. Mild to moderate stenosis A2 segment bilaterally. Right M1 segment is widely patent. Previously noted occlusion of the right M2 branch is now patent. There is irregularity at the site of previous occlusion which may be due to plaque or residual clot. There is good distal flow. Left middle cerebral artery patent without significant stenosis. Negative for aneurysm. IMPRESSION: 1. Small subcentimeter areas of acute infarct in the right frontal white matter, right posterior insula, and right posterior parietal lobe. 2. Chronic infarct right frontal parietal lobe. Chronic hemorrhagic left PCA infarct.  3. Right M2 segment which was previously occluded is now patent with mild residual irregularity at the previous occlusion site. Electronically Signed   By: Franchot Gallo M.D.   On: 09/26/2019 18:31   IR CT Head Ltd  Result Date: 09/26/2019 INDICATION: 69 year old male presenting with acute right hemispheric ischemic stroke, right M2 occlusion, presenting for cerebral angiogram and attempt for thrombectomy EXAM: ULTRASOUND GUIDED ACCESS RIGHT COMMON FEMORAL ARTERY CERVICAL AND CEREBRAL ANGIOGRAM MECHANICAL THROMBECTOMY  OF RIGHT MCA TERRITORY FLAT PANEL CT DEPLOYMENT OF ANGIO-SEAL COMPARISON:  CT IMAGING OF THE SAME DAY MEDICATIONS: 2 g Ancef. The antibiotic was administered within 1 hour of the procedure ANESTHESIA/SEDATION: General endotracheal tube anesthesia CONTRAST:  60 cc Omnipaque 300 FLUOROSCOPY TIME:  Fluoroscopy Time: 8 minutes 18 seconds (1675 mGy). COMPLICATIONS: None TECHNIQUE: Informed written consent was obtained from the patient's family after a thorough discussion of the procedural risks, benefits and alternatives. Specific risks discussed include: Bleeding, infection, contrast reaction, kidney injury/failure, need for further procedure/surgery, arterial injury or dissection, embolization to new territory, intracranial hemorrhage (10-15% risk), neurologic deterioration, cardiopulmonary collapse, death. All questions were addressed. Maximal Sterile Barrier Technique was utilized including during the procedure including caps, mask, sterile gowns, sterile gloves, sterile drape, hand hygiene and skin antiseptic. A timeout was performed prior to the initiation of the procedure. FINDINGS: Initial Findings: Right common carotid artery:  Normal course caliber and contour. Right external carotid artery: Patent with antegrade flow. Right internal carotid artery: Normal course caliber and contour of the cervical portion. Vertical and petrous segment patent with normal course caliber contour. Cavernous  segment patent. Clinoid segment patent. Antegrade flow of the ophthalmic artery. Ophthalmic segment patent. Terminus patent. Right MCA: M1 segment patent. Early temporal branch from the proximal MCA. Insular segments patent. Initial angiogram demonstrates angiographic cut off of superior division into the parietal territory, within the insular/operculum transition. TICI 0 flow beyond the occlusion. Right ACA: A 1 segment patent. A 2 segment perfuses the right territory. Patent anterior communicating artery. Completion Findings: Right MCA: After single pass thrombectomy, there is complete restoration of flow through the occluded segment to the superior segment. TICI 3 flow restored Flat panel CT: No contrast staining or hemorrhage identified on the postcontrast CT. Redemonstration of remote infarcts of bilateral hemispheres, left PCA/inferior division territory, and on the right the likely remote infarction of the perirolandic parietal region. PROCEDURE: The anesthesia team was present to provide general endotracheal tube anesthesia and for patient monitoring during the procedure. Intubation was performed in negative pressure [place] in neuro IR holding. Interventional neuro radiology nursing staff was also present. Ultrasound survey of the right inguinal region was performed with images stored and sent to PACs. 11 blade scalpel was used to make a small incision. Blunt dissection was performed with US guidance. A micropuncture needle was used access the right common femoral artery under ultrasound. With excellent arterial blood flow returned, an .018 micro wire was passed through the needle, observed to enter the abdominal aorta under fluoroscopy. The needle was removed, and a micropuncture sheath was placed over the wire. The inner dilator and wire were removed, and an 035 wire was advanced under fluoroscopy into the abdominal aorta. The sheath was removed and a 25cm 56F straight vascular sheath was placed. The dilator  was removed and the sheath was flushed. Sheath was attached to pressurized and heparinized saline bag for constant forward flow. A coaxial system was then advanced over the 035 wire. This included a 95cm 087 "Walrus" balloon guide with coaxial 125cm Berenstein diagnostic catheter. This was advanced to the proximal descending thoracic aorta. Wire was then removed. Double flush of the catheter was performed. Catheter was then used to select the innominate artery. Angiogram was performed. Using roadmap technique, the catheter was advanced over a standard glide wire into right cervical ICA, with distal position achieved of the balloon guide. The diagnostic catheter and the wire were removed. Formal angiogram was performed. Road map function was used once  the occluded vessel was identified. Copious back flush was performed and the balloon catheter was attached to heparinized and pressurized saline bag for forward flow. A second coaxial system was then advanced through the balloon catheter, which included the selected intermediate catheter, microcatheter, and microwire. In this scenario, the set up included a 115cm CAT-5 intermediate catheter, a Trevo Provue18 microcatheter, and 014 synchro soft wire. This system was advanced through the balloon guide catheter under the road-map function, with adequate back-flush at the rotating hemostatic valve at that back end of the balloon guide. Once the wire and microcatheter were into the laceral segment, we attach heparinized saline pressured flush to the guide. Microcatheter and the intermediate catheter system were advanced through the terminal ICA and MCA to the level of the occlusion. The micro wire was then carefully advanced through the occluded segment. Microcatheter was then manipulated through the occluded segment and the wire was removed with saline drip at the hub. Blood was then aspirated through the hub of the microcatheter, and a gentle contrast injection was  performed confirming intraluminal position. A rotating hemostatic valve was then attached to the back end of the microcatheter, and a pressurized and heparinized saline bag was attached to the catheter. 3 x 20 solitaire device was then selected. Back flush was achieved at the rotating hemostatic valve, and then the device was gently advanced through the microcatheter to the distal end. The retriever was then unsheathed by withdrawing the microcatheter under fluoroscopy. Once the retriever was completely unsheathed, control angiogram was performed from the balloon catheter. A 4 minute time interval was observed. The balloon at the balloon guide catheter was then inflated under fluoroscopy for proximal flow arrest. Constant aspiration was then performed at the CAT-5 intermediate catheter, as the retriever was gently and slowly withdrawn with fluoroscopic observation. Once the retriever was entirely within the tip of the intermediate catheter, both were removed from the system. Free aspiration was confirmed at the hub of the balloon guide catheter, with free blood return confirmed. The balloon was then deflated, and a control angiogram was performed. Restoration of flow was confirmed. The skin at the puncture site was then cleaned with Hibiclens. The 8 French sheath was removed and an 47F angioseal was deployed. Flat panel CT was performed. Patient tolerated the procedure well and remained hemodynamically stable throughout. No complications were encountered and no significant blood loss encountered. IMPRESSION: Status post ultrasound guided access right common femoral artery for cervical and cerebral angiogram and mechanical thrombectomy of right MCA/M2 occlusion, with single pass, restoration of TICI-3 flow and deployment of Angio-Seal. Signed, Dulcy Fanny. Dellia Nims, RPVI [facility] Radiology PLAN: The patient will remain intubated, as he remains COVID status unknown ICU  status Target systolic blood pressure of 120-140 Right hip straight time 6 hours Frequent neurovascular checks Repeat neurologic imaging with CT and/MRI at the discretion of neurology team Electronically Signed   By: Corrie Mckusick D.O.   On: 09/26/2019 04:31   IR US Guide Vasc Access Right  Result Date: 09/26/2019 INDICATION: 69 year old male presenting with acute right hemispheric ischemic stroke, right M2 occlusion, presenting for cerebral angiogram and attempt for thrombectomy EXAM: ULTRASOUND GUIDED ACCESS RIGHT COMMON FEMORAL ARTERY CERVICAL AND CEREBRAL ANGIOGRAM MECHANICAL THROMBECTOMY OF RIGHT MCA TERRITORY FLAT PANEL CT DEPLOYMENT OF ANGIO-SEAL COMPARISON:  CT IMAGING OF THE SAME DAY MEDICATIONS: 2 g Ancef. The antibiotic was administered within 1 hour of the procedure ANESTHESIA/SEDATION: General endotracheal tube anesthesia CONTRAST:  60 cc  Omnipaque 300 FLUOROSCOPY TIME:  Fluoroscopy Time: 8 minutes 18 seconds (1675 mGy). COMPLICATIONS: None TECHNIQUE: Informed written consent was obtained from the patient's family after a thorough discussion of the procedural risks, benefits and alternatives. Specific risks discussed include: Bleeding, infection, contrast reaction, kidney injury/failure, need for further procedure/surgery, arterial injury or dissection, embolization to new territory, intracranial hemorrhage (10-15% risk), neurologic deterioration, cardiopulmonary collapse, death. All questions were addressed. Maximal Sterile Barrier Technique was utilized including during the procedure including caps, mask, sterile gowns, sterile gloves, sterile drape, hand hygiene and skin antiseptic. A timeout was performed prior to the initiation of the procedure. FINDINGS: Initial Findings: Right common carotid artery:  Normal course caliber and contour. Right external carotid artery: Patent with antegrade flow. Right internal carotid artery: Normal course caliber and contour of the cervical portion. Vertical and  petrous segment patent with normal course caliber contour. Cavernous segment patent. Clinoid segment patent. Antegrade flow of the ophthalmic artery. Ophthalmic segment patent. Terminus patent. Right MCA: M1 segment patent. Early temporal branch from the proximal MCA. Insular segments patent. Initial angiogram demonstrates angiographic cut off of superior division into the parietal territory, within the insular/operculum transition. TICI 0 flow beyond the occlusion. Right ACA: A 1 segment patent. A 2 segment perfuses the right territory. Patent anterior communicating artery. Completion Findings: Right MCA: After single pass thrombectomy, there is complete restoration of flow through the occluded segment to the superior segment. TICI 3 flow restored Flat panel CT: No contrast staining or hemorrhage identified on the postcontrast CT. Redemonstration of remote infarcts of bilateral hemispheres, left PCA/inferior division territory, and on the right the likely remote infarction of the perirolandic parietal region. PROCEDURE: The anesthesia team was present to provide general endotracheal tube anesthesia and for patient monitoring during the procedure. Intubation was performed in negative pressure [place] in neuro IR holding. Interventional neuro radiology nursing staff was also present. Ultrasound survey of the right inguinal region was performed with images stored and sent to PACs. 11 blade scalpel was used to make a small incision. Blunt dissection was performed with US guidance. A micropuncture needle was used access the right common femoral artery under ultrasound. With excellent arterial blood flow returned, an .018 micro wire was passed through the needle, observed to enter the abdominal aorta under fluoroscopy. The needle was removed, and a micropuncture sheath was placed over the wire. The inner dilator and wire were removed, and an 035 wire was advanced under fluoroscopy into the abdominal aorta. The sheath was  removed and a 25cm 68F straight vascular sheath was placed. The dilator was removed and the sheath was flushed. Sheath was attached to pressurized and heparinized saline bag for constant forward flow. A coaxial system was then advanced over the 035 wire. This included a 95cm 087 "Walrus" balloon guide with coaxial 125cm Berenstein diagnostic catheter. This was advanced to the proximal descending thoracic aorta. Wire was then removed. Double flush of the catheter was performed. Catheter was then used to select the innominate artery. Angiogram was performed. Using roadmap technique, the catheter was advanced over a standard glide wire into right cervical ICA, with distal position achieved of the balloon guide. The diagnostic catheter and the wire were removed. Formal angiogram was performed. Road map function was used once the occluded vessel was identified. Copious back flush was performed and the balloon catheter was attached to heparinized and pressurized saline bag for forward flow. A second coaxial system was then advanced through the balloon catheter, which included the selected intermediate  catheter, microcatheter, and microwire. In this scenario, the set up included a 115cm CAT-5 intermediate catheter, a Trevo Provue18 microcatheter, and 014 synchro soft wire. This system was advanced through the balloon guide catheter under the road-map function, with adequate back-flush at the rotating hemostatic valve at that back end of the balloon guide. Once the wire and microcatheter were into the laceral segment, we attach heparinized saline pressured flush to the guide. Microcatheter and the intermediate catheter system were advanced through the terminal ICA and MCA to the level of the occlusion. The micro wire was then carefully advanced through the occluded segment. Microcatheter was then manipulated through the occluded segment and the wire was removed with saline drip at the hub. Blood was then aspirated through  the hub of the microcatheter, and a gentle contrast injection was performed confirming intraluminal position. A rotating hemostatic valve was then attached to the back end of the microcatheter, and a pressurized and heparinized saline bag was attached to the catheter. 3 x 20 solitaire device was then selected. Back flush was achieved at the rotating hemostatic valve, and then the device was gently advanced through the microcatheter to the distal end. The retriever was then unsheathed by withdrawing the microcatheter under fluoroscopy. Once the retriever was completely unsheathed, control angiogram was performed from the balloon catheter. A 4 minute time interval was observed. The balloon at the balloon guide catheter was then inflated under fluoroscopy for proximal flow arrest. Constant aspiration was then performed at the CAT-5 intermediate catheter, as the retriever was gently and slowly withdrawn with fluoroscopic observation. Once the retriever was entirely within the tip of the intermediate catheter, both were removed from the system. Free aspiration was confirmed at the hub of the balloon guide catheter, with free blood return confirmed. The balloon was then deflated, and a control angiogram was performed. Restoration of flow was confirmed. The skin at the puncture site was then cleaned with Hibiclens. The 8 French sheath was removed and an 90F angioseal was deployed. Flat panel CT was performed. Patient tolerated the procedure well and remained hemodynamically stable throughout. No complications were encountered and no significant blood loss encountered. IMPRESSION: Status post ultrasound guided access right common femoral artery for cervical and cerebral angiogram and mechanical thrombectomy of right MCA/M2 occlusion, with single pass, restoration of TICI-3 flow and deployment of Angio-Seal. Signed, Dulcy Fanny. Dellia Nims, RPVI [facility] Radiology PLAN: The  patient will remain intubated, as he remains COVID status unknown ICU status Target systolic blood pressure of 120-140 Right hip straight time 6 hours Frequent neurovascular checks Repeat neurologic imaging with CT and/MRI at the discretion of neurology team Electronically Signed   By: Corrie Mckusick D.O.   On: 09/26/2019 04:31   CT Code Stroke Cerebral Perfusion with contrast  Result Date: 09/26/2019 CLINICAL DATA:  Right-sided gaze and neglect. EXAM: CT ANGIOGRAPHY HEAD AND NECK CT PERFUSION BRAIN TECHNIQUE: Multidetector CT imaging of the head and neck was performed using the standard protocol during bolus administration of intravenous contrast. Multiplanar CT image reconstructions and MIPs were obtained to evaluate the vascular anatomy. Carotid stenosis measurements (when applicable) are obtained utilizing NASCET criteria, using the distal internal carotid diameter as the denominator. Multiphase CT imaging of the brain was performed following IV bolus contrast injection. Subsequent parametric perfusion maps were calculated using RAPID software. CONTRAST:  179mL OMNIPAQUE IOHEXOL 350 MG/ML SOLN COMPARISON:  Head CT 09/26/2019 CTA neck 11/17/2015 FINDINGS: CTA NECK FINDINGS SKELETON: There is no bony  spinal canal stenosis. No lytic or blastic lesion. OTHER NECK: Normal pharynx, larynx and major salivary glands. No cervical lymphadenopathy. Unremarkable thyroid gland. UPPER CHEST: No pneumothorax or pleural effusion. No nodules or masses. AORTIC ARCH: There is no calcific atherosclerosis of the aortic arch. There is no aneurysm, dissection or hemodynamically significant stenosis of the visualized portion of the aorta. Conventional 3 vessel aortic branching pattern. The visualized proximal subclavian arteries are widely patent. RIGHT CAROTID SYSTEM: No dissection, occlusion or aneurysm. Mild atherosclerotic calcification at the carotid bifurcation without hemodynamically significant stenosis. LEFT CAROTID SYSTEM:  No dissection, occlusion or aneurysm. Mild atherosclerotic calcification at the carotid bifurcation without hemodynamically significant stenosis. VERTEBRAL ARTERIES: Left dominant configuration. Both origins are clearly patent. There is no dissection, occlusion or flow-limiting stenosis to the skull base (V1-V3 segments). CTA HEAD FINDINGS POSTERIOR CIRCULATION: --Vertebral arteries: Normal V4 segments. --Posterior inferior cerebellar arteries (PICA): Patent origins from the vertebral arteries. --Anterior inferior cerebellar arteries (AICA): Patent origins from the basilar artery. --Basilar artery: Normal. --Superior cerebellar arteries: Normal. --Posterior cerebral arteries: Normal. Both originate from the basilar artery. Posterior communicating arteries (p-comm) are diminutive or absent. ANTERIOR CIRCULATION: --Intracranial internal carotid arteries: Normal. --Anterior cerebral arteries (ACA): Normal. Both A1 segments are present. Patent anterior communicating artery (a-comm). --Middle cerebral arteries (MCA): Occlusion of the right MCA distal M2 segment corresponding to the region of ischemia identified below. Left M2 occlusion is favored to be chronic. VENOUS SINUSES: As permitted by contrast timing, patent. ANATOMIC VARIANTS: None Review of the MIP images confirms the above findings. CT Brain Perfusion Findings: ASPECTS: 10 CBF (<30%) Volume: 39mL Perfusion (Tmax>6.0s) volume: 13mL Mismatch Volume: 80mL Infarction Location:The identified region of core infarct corresponds to a chronic infarct in the right frontal lobe. IMPRESSION: 1. Occlusion of the right MCA distal M2 segment, corresponding to the region of ischemia in the posterior right MCA territory. 2. Chronic left M2 occlusion. 3. 36 mL area at risk ischemic brain according to perfusion analysis. The calculated core infarct is artifactual and relates to a remote right frontal lobe infarct. Critical Value/emergent results were called by telephone at the  time of interpretation on 09/26/2019 at 1:46 am to provider MCNE[facility] , who verbally acknowledged these results. Electronically Signed   By: Ulyses Jarred M.D.   On: 09/26/2019 01:46   DG CHEST PORT 1 VIEW  Result Date: 09/26/2019 CLINICAL DATA:  CVA EXAM: PORTABLE CHEST 1 VIEW COMPARISON:  08/04/2009 chest radiograph. FINDINGS: Endotracheal tube tip is 4.7 cm above the carina. Loop recorder device overlies the left heart. Stable cardiomediastinal silhouette with top-normal heart size. No pneumothorax. No pleural effusion. No overt pulmonary edema. Mild left basilar atelectasis. IMPRESSION: 1. Endotracheal tube tip is 4.7 cm above the carina. 2. Mild left basilar atelectasis. Electronically Signed   By: Ilona Sorrel M.D.   On: 09/26/2019 04:32   IR PERCUTANEOUS ART THROMBECTOMY/INFUSION INTRACRANIAL INC DIAG ANGIO  Result Date: 09/26/2019 INDICATION: 69 year old male presenting with acute right hemispheric ischemic stroke, right M2 occlusion, presenting for cerebral angiogram and attempt for thrombectomy EXAM: ULTRASOUND GUIDED ACCESS RIGHT COMMON FEMORAL ARTERY CERVICAL AND CEREBRAL ANGIOGRAM MECHANICAL THROMBECTOMY OF RIGHT MCA TERRITORY FLAT PANEL CT DEPLOYMENT OF ANGIO-SEAL COMPARISON:  CT IMAGING OF THE SAME DAY MEDICATIONS: 2 g Ancef. The antibiotic was administered within 1 hour of the procedure ANESTHESIA/SEDATION: General endotracheal tube anesthesia CONTRAST:  60 cc Omnipaque 300 FLUOROSCOPY TIME:  Fluoroscopy Time: 8 minutes 18 seconds (1675 mGy). COMPLICATIONS: None TECHNIQUE: Informed written consent was obtained from the  patient's family after a thorough discussion of the procedural risks, benefits and alternatives. Specific risks discussed include: Bleeding, infection, contrast reaction, kidney injury/failure, need for further procedure/surgery, arterial injury or dissection, embolization to new territory, intracranial hemorrhage (10-15% risk), neurologic deterioration, cardiopulmonary  collapse, death. All questions were addressed. Maximal Sterile Barrier Technique was utilized including during the procedure including caps, mask, sterile gowns, sterile gloves, sterile drape, hand hygiene and skin antiseptic. A timeout was performed prior to the initiation of the procedure. FINDINGS: Initial Findings: Right common carotid artery:  Normal course caliber and contour. Right external carotid artery: Patent with antegrade flow. Right internal carotid artery: Normal course caliber and contour of the cervical portion. Vertical and petrous segment patent with normal course caliber contour. Cavernous segment patent. Clinoid segment patent. Antegrade flow of the ophthalmic artery. Ophthalmic segment patent. Terminus patent. Right MCA: M1 segment patent. Early temporal branch from the proximal MCA. Insular segments patent. Initial angiogram demonstrates angiographic cut off of superior division into the parietal territory, within the insular/operculum transition. TICI 0 flow beyond the occlusion. Right ACA: A 1 segment patent. A 2 segment perfuses the right territory. Patent anterior communicating artery. Completion Findings: Right MCA: After single pass thrombectomy, there is complete restoration of flow through the occluded segment to the superior segment. TICI 3 flow restored Flat panel CT: No contrast staining or hemorrhage identified on the postcontrast CT. Redemonstration of remote infarcts of bilateral hemispheres, left PCA/inferior division territory, and on the right the likely remote infarction of the perirolandic parietal region. PROCEDURE: The anesthesia team was present to provide general endotracheal tube anesthesia and for patient monitoring during the procedure. Intubation was performed in negative pressure [place] in neuro IR holding. Interventional neuro radiology nursing staff was also present. Ultrasound survey of the right inguinal region was performed with images stored and sent to PACs. 11  blade scalpel was used to make a small incision. Blunt dissection was performed with US guidance. A micropuncture needle was used access the right common femoral artery under ultrasound. With excellent arterial blood flow returned, an .018 micro wire was passed through the needle, observed to enter the abdominal aorta under fluoroscopy. The needle was removed, and a micropuncture sheath was placed over the wire. The inner dilator and wire were removed, and an 035 wire was advanced under fluoroscopy into the abdominal aorta. The sheath was removed and a 25cm 103F straight vascular sheath was placed. The dilator was removed and the sheath was flushed. Sheath was attached to pressurized and heparinized saline bag for constant forward flow. A coaxial system was then advanced over the 035 wire. This included a 95cm 087 "Walrus" balloon guide with coaxial 125cm Berenstein diagnostic catheter. This was advanced to the proximal descending thoracic aorta. Wire was then removed. Double flush of the catheter was performed. Catheter was then used to select the innominate artery. Angiogram was performed. Using roadmap technique, the catheter was advanced over a standard glide wire into right cervical ICA, with distal position achieved of the balloon guide. The diagnostic catheter and the wire were removed. Formal angiogram was performed. Road map function was used once the occluded vessel was identified. Copious back flush was performed and the balloon catheter was attached to heparinized and pressurized saline bag for forward flow. A second coaxial system was then advanced through the balloon catheter, which included the selected intermediate catheter, microcatheter, and microwire. In this scenario, the set up included a 115cm CAT-5 intermediate catheter, a Trevo Provue18 microcatheter, and 014 synchro  soft wire. This system was advanced through the balloon guide catheter under the road-map function, with adequate back-flush at  the rotating hemostatic valve at that back end of the balloon guide. Once the wire and microcatheter were into the laceral segment, we attach heparinized saline pressured flush to the guide. Microcatheter and the intermediate catheter system were advanced through the terminal ICA and MCA to the level of the occlusion. The micro wire was then carefully advanced through the occluded segment. Microcatheter was then manipulated through the occluded segment and the wire was removed with saline drip at the hub. Blood was then aspirated through the hub of the microcatheter, and a gentle contrast injection was performed confirming intraluminal position. A rotating hemostatic valve was then attached to the back end of the microcatheter, and a pressurized and heparinized saline bag was attached to the catheter. 3 x 20 solitaire device was then selected. Back flush was achieved at the rotating hemostatic valve, and then the device was gently advanced through the microcatheter to the distal end. The retriever was then unsheathed by withdrawing the microcatheter under fluoroscopy. Once the retriever was completely unsheathed, control angiogram was performed from the balloon catheter. A 4 minute time interval was observed. The balloon at the balloon guide catheter was then inflated under fluoroscopy for proximal flow arrest. Constant aspiration was then performed at the CAT-5 intermediate catheter, as the retriever was gently and slowly withdrawn with fluoroscopic observation. Once the retriever was entirely within the tip of the intermediate catheter, both were removed from the system. Free aspiration was confirmed at the hub of the balloon guide catheter, with free blood return confirmed. The balloon was then deflated, and a control angiogram was performed. Restoration of flow was confirmed. The skin at the puncture site was then cleaned with Hibiclens. The 8 French sheath was removed and an 73F angioseal was deployed. Flat  panel CT was performed. Patient tolerated the procedure well and remained hemodynamically stable throughout. No complications were encountered and no significant blood loss encountered. IMPRESSION: Status post ultrasound guided access right common femoral artery for cervical and cerebral angiogram and mechanical thrombectomy of right MCA/M2 occlusion, with single pass, restoration of TICI-3 flow and deployment of Angio-Seal. Signed, Dulcy Fanny. Dellia Nims, RPVI [facility]iology Special[facility] Radiology PLAN: The patient will remain intubated, as he remains COVID status unknown ICU status Target systolic blood pressure of 120-140 Right hip straight time 6 hours Frequent neurovascular checks Repeat neurologic imaging with CT and/MRI at the discretion of neurology team Electronically Signed   By: Corrie Mckusick D.O.   On: 09/26/2019 04:31   CT HEAD CODE STROKE WO CONTRAST  Result Date: 09/26/2019 CLINICAL DATA:  Code stroke.  Right-sided gaze EXAM: CT HEAD WITHOUT CONTRAST TECHNIQUE: Contiguous axial images were obtained from the base of the skull through the vertex without intravenous contrast. COMPARISON:  11/17/2015 FINDINGS: Brain: There is no mass, hemorrhage or extra-axial collection. There is generalized atrophy without lobar predilection. There is hypoattenuation of the periventricular white matter, most commonly indicating chronic ischemic microangiopathy. There are multiple old infarcts, the largest of which is in the left PCA territory. Vascular: No abnormal hyperdensity of the major intracranial arteries or dural venous sinuses. No intracranial atherosclerosis. Skull: The visualized skull base, calvarium and extracranial soft tissues are normal. Sinuses/Orbits: No fluid levels or advanced mucosal thickening of the visualized paranasal sinuses. No mastoid or middle ear effusion. The orbits are normal. ASPECTS [facility] Stroke Program Early CT Score) - Ganglionic  level infarction  (caudate, lentiform nuclei, internal capsule, insula, M1-M3 cortex): 7 - Supraganglionic infarction (M4-M6 cortex): 3 Total score (0-10 with 10 being normal): 10 IMPRESSION: 1. No acute intracranial abnormality. 2. ASPECTS is 10. 3. Multiple old infarcts and chronic ischemic microangiopathy. These results were communicated to Dr. Roland Rack at 1:11 am on 09/26/2019 by text page via the [facility] messaging system. * Electronically Signed   By: Ulyses Jarred M.D.   On: 09/26/2019 01:12    Labs:  CBC: Recent Labs    03/15/19 1203 09/26/19 0053 09/26/19 0110 09/26/19 0549 09/27/19 0615  WBC 6.1 6.8  --   --  7.1  HGB 10.8* 10.9* 12.6* 11.2* 10.2*  HCT 34.7* 36.6* 37.0* 33.0* 32.6*  PLT 407 343  --   --  239    COAGS: Recent Labs    09/26/19 0056  INR 1.0  APTT 33    BMP: Recent Labs    03/15/19 1203 03/15/19 1203 09/26/19 0053 09/26/19 0110 09/26/19 0549 09/27/19 0615  NA 140  --  139 142 142 137  K 4.3   < > 3.4* 3.6 4.0 3.4*  CL 104  --  104 103  --  103  CO2 24  --  25  --   --  23  GLUCOSE 76  --  100* 93  --  87  BUN 13  --  13 14  --  6*  CALCIUM 9.5  --  9.6  --   --  8.6*  CREATININE 0.82  --  0.92 0.90  --  0.78  GFRNONAA 92  --  >60  --   --  >60  GFRAA 107  --  >60  --   --  >60   < > = values in this interval not displayed.     Assessment and Plan:  History of acute CVA s/p cerebral arteriogram with emergent mechanical thrombectomy of right MCA M2 segment occlusion achieving a TICI 3 revascularization 09/26/2019 by Dr. Earleen Newport. Patient's condition improving- no focal neurologic symptoms noted, follows simple commands, can spontaneously move all extremities. Right groin incision stable, distal pulses (DPs) 2+ bilaterally. Further plans per neurology- appreciate and agree with management. Please call NIR with questions/concerns.   Electronically Signed: Earley Abide, PA-C 09/27/2019, 10:33 AM   I spent a total of 25 Minutes at the the patient's  bedside AND on the patient's hospital floor or unit, greater than 50% of which was counseling/coordinating care for CVA s/p revascularization.

## 2019-09-28 DIAGNOSIS — E785 Hyperlipidemia, unspecified: Secondary | ICD-10-CM

## 2019-09-28 DIAGNOSIS — D509 Iron deficiency anemia, unspecified: Secondary | ICD-10-CM

## 2019-09-28 DIAGNOSIS — I255 Ischemic cardiomyopathy: Secondary | ICD-10-CM

## 2019-09-28 DIAGNOSIS — D62 Acute posthemorrhagic anemia: Secondary | ICD-10-CM

## 2019-09-28 DIAGNOSIS — I63511 Cerebral infarction due to unspecified occlusion or stenosis of right middle cerebral artery: Secondary | ICD-10-CM | POA: Diagnosis not present

## 2019-09-28 DIAGNOSIS — I10 Essential (primary) hypertension: Secondary | ICD-10-CM | POA: Diagnosis not present

## 2019-09-28 LAB — BASIC METABOLIC PANEL
Anion gap: 8 (ref 5–15)
BUN: 12 mg/dL (ref 8–23)
CO2: 25 mmol/L (ref 22–32)
Calcium: 8.9 mg/dL (ref 8.9–10.3)
Chloride: 105 mmol/L (ref 98–111)
Creatinine, Ser: 1.06 mg/dL (ref 0.61–1.24)
GFR calc Af Amer: >60 mL/min (ref >60)
GFR calc non Af Amer: >60 mL/min (ref >60)
Glucose, Bld: 89 mg/dL (ref 70–99)
Potassium: 3.4 mmol/L — ABNORMAL LOW (ref 3.5–5.1)
Sodium: 138 mmol/L (ref 135–145)

## 2019-09-28 LAB — GLUCOSE, CAPILLARY
Glucose-Capillary: 103 mg/dL — ABNORMAL HIGH (ref 70–99)
Glucose-Capillary: 89 mg/dL (ref 70–99)
Glucose-Capillary: 88 mg/dL (ref 70–99)
Glucose-Capillary: 105 mg/dL — ABNORMAL HIGH (ref 70–99)

## 2019-09-28 LAB — CBC
HCT: 31.5 % — ABNORMAL LOW (ref 39.0–52.0)
Hemoglobin: 9.7 g/dL — ABNORMAL LOW (ref 13.0–17.0)
MCH: 23.3 pg — ABNORMAL LOW (ref 26.0–34.0)
MCHC: 30.8 g/dL (ref 30.0–36.0)
MCV: 75.5 fL — ABNORMAL LOW (ref 80.0–100.0)
Platelets: 291 10*3/uL (ref 150–400)
RBC: 4.17 MIL/uL — ABNORMAL LOW (ref 4.22–5.81)
RDW: 20.3 % — ABNORMAL HIGH (ref 11.5–15.5)
WBC: 5.8 10*3/uL (ref 4.0–10.5)
nRBC: 0 % (ref 0.0–0.2)

## 2019-09-28 MED ORDER — ADULT MULTIVITAMIN W/MINERALS CH: 1.0000 | ORAL_TABLET | Freq: Every day | ORAL | Status: DC

## 2019-09-28 MED ORDER — SPIRONOLACTONE 25 MG PO TABS: 25.0000 mg | ORAL_TABLET | Freq: Every day | ORAL | 2 refills | Status: DC

## 2019-09-28 MED ORDER — ENSURE ENLIVE PO LIQD: 237.0000 mL | Freq: Three times a day (TID) | ORAL | Status: DC

## 2019-09-28 MED ORDER — FERROUS SULFATE 325 (65 FE) MG PO TABS: 325.0000 mg | ORAL_TABLET | Freq: Three times a day (TID) | ORAL | 3 refills | Status: DC

## 2019-09-28 MED ORDER — PANTOPRAZOLE SODIUM 40 MG PO TBEC: 40.0000 mg | DELAYED_RELEASE_TABLET | Freq: Every day | ORAL | 2 refills | Status: DC

## 2019-09-28 MED ORDER — DOCUSATE SODIUM 100 MG PO CAPS: 100.0000 mg | ORAL_CAPSULE | Freq: Two times a day (BID) | ORAL | 0 refills | Status: DC | PRN

## 2019-09-28 MED ORDER — APIXABAN 5 MG PO TABS: 5.0000 mg | ORAL_TABLET | Freq: Two times a day (BID) | ORAL | 2 refills | Status: DC

## 2019-09-28 MED ORDER — POTASSIUM CHLORIDE 20 MEQ PO PACK
40.0000 meq | PACK | Freq: Once | ORAL | Status: AC
  Administered 2019-09-28: 40 meq via ORAL
  Filled 2019-09-28: qty 2

## 2019-09-28 MED ORDER — METOPROLOL TARTRATE 25 MG PO TABS: 25.0000 mg | ORAL_TABLET | Freq: Two times a day (BID) | ORAL | 2 refills | Status: DC

## 2019-09-28 MED ORDER — ATORVASTATIN CALCIUM 40 MG PO TABS: 40.0000 mg | ORAL_TABLET | Freq: Every day | ORAL | 2 refills | Status: DC

## 2019-09-28 MED ORDER — POLYETHYLENE GLYCOL 3350 17 G PO PACK: 17.0000 g | PACK | Freq: Every day | ORAL | 0 refills | Status: DC | PRN

## 2019-09-28 MED ORDER — LISINOPRIL 40 MG PO TABS: 40.0000 mg | ORAL_TABLET | Freq: Every day | ORAL | 2 refills | Status: DC

## 2019-09-28 MED ORDER — POTASSIUM CHLORIDE 10 MEQ/100ML IV SOLN
10.0000 meq | INTRAVENOUS | Status: AC
  Administered 2019-09-28 (×4): 10 meq via INTRAVENOUS
  Filled 2019-09-28 (×4): qty 100

## 2019-09-28 MED ORDER — ENSURE ENLIVE PO LIQD: 237.0000 mL | Freq: Two times a day (BID) | ORAL | 12 refills | Status: DC

## 2019-09-28 MED FILL — LISINOPRIL 40 MG TABLET: 40 | 90 days supply | Qty: 90 | Fill #0

## 2019-09-28 MED FILL — PANTOPRAZOLE SOD DR 40 MG T: 40 | 90 days supply | Qty: 90 | Fill #0

## 2019-09-28 MED FILL — METOPROLOL TARTRATE 25 MG T: 25 | 90 days supply | Qty: 180 | Fill #0

## 2019-09-28 MED FILL — SPIRONOLACTONE 25 MG TABLET: 25 | 90 days supply | Qty: 90 | Fill #0

## 2019-09-28 MED FILL — ATORVASTATIN CALCIUM 40 MG: 40 | 90 days supply | Qty: 90 | Fill #0

## 2019-09-28 NOTE — Plan of Care (Signed)
Called by RN - AM labs show K 3.4  Orders - replace IV and PO - orders placed in chart   -- Amie Portland, MD [facility]

## 2019-09-28 NOTE — Progress Notes (Signed)
Initial Nutrition Assessment  DOCUMENTATION CODES:   Non-severe (moderate) malnutrition in context of social or environmental circumstances  INTERVENTION:  Ensure Enlive po TID, each supplement provides 350 kcal and 20 grams of protein  MVI daily  NUTRITION DIAGNOSIS:   Moderate Malnutrition related to social / environmental circumstances as evidenced by energy intake < 75% for > or equal to 3 months, mild muscle depletion, mild fat depletion.   GOAL:   Patient will meet greater than or equal to 90% of their needs   MONITOR:   PO intake, Supplement acceptance, Weight trends, Labs, I & O's  REASON FOR ASSESSMENT:   Malnutrition Screening Tool    ASSESSMENT:   Pt with a PMH significant for HTN, EtOH abuse, CVA, and Afib presented with weakness. Pt found to have left MCA ELVO now s/p mechanical thrombectomy.  Discussed pt with RN.   Pt reports appetite is poor and has been poor for a couple of years. Pt states "I eat what I want when I feel like it." Per pt, this is typically just 1 meal a day with snacks on occasion.   PO Intake: 50-80% x 3 recorded meals (68% average meal intake)  Pt currently receiving Ensure Enlive BID.  UOP: 869ml x24 hours I/O: -1,846.76ml since admit  Labs: K+ 3.4 (L), CBGs 89-88-105 Medications reviewed and include: colace, ferrous sulfate, novolog, aldactone    NUTRITION - FOCUSED PHYSICAL EXAM:    Most Recent Value  Orbital Region  No depletion  Upper Arm Region  Mild depletion  Thoracic and Lumbar Region  Mild depletion  Buccal Region  Mild depletion  Temple Region  Mild depletion  Clavicle Bone Region  No depletion  Clavicle and Acromion Bone Region  No depletion  Scapular Bone Region  No depletion  Dorsal Hand  No depletion  Patellar Region  Mild depletion  Anterior Thigh Region  Mild depletion  Posterior Calf Region  Mild depletion  Edema (RD Assessment)  None  Hair  Reviewed  Eyes  Reviewed  Mouth  Reviewed  Skin   Reviewed  Nails  Reviewed       Diet Order:   Diet Order            Diet Heart Room service appropriate? Yes with Assist; Fluid consistency: Thin  Diet effective now              EDUCATION NEEDS:   No education needs have been identified at this time  Skin:  Skin Assessment: Reviewed RN Assessment  Last BM:  4/29  Height:   Ht Readings from Last 1 Encounters:  09/26/19 5\' 11"  (1.803 m)    Weight:   Wt Readings from Last 10 Encounters:  09/28/19 75.1 kg  03/15/19 70.5 kg  01/19/19 63.5 kg  11/16/18 68.9 kg  10/14/18 71.7 kg  09/14/18 71.2 kg  08/03/18 69.9 kg  04/22/18 71.2 kg  11/05/17 76.2 kg  08/05/17 78.9 kg    BMI:  Body mass index is 23.09 kg/m.  Estimated Nutritional Needs:   Kcal:  2100-2300  Protein:  105-115 grams  Fluid:  >2L/d    Larkin Ina, MS, RD, LDN RD pager number and weekend/on-call pager number located in [place].

## 2019-09-28 NOTE — Progress Notes (Signed)
Potassium 3.4, Arora informed.

## 2019-09-28 NOTE — Progress Notes (Signed)
Physical Therapy Treatment Patient Details Name: Gregory Perry MRN: [id number] DOB: 02-19-1951 Today's Date: 09/28/2019    History of Present Illness Gregory Perry is a 69 y.o. male with a history of cardiomyopathy, stroke, atrial fibrillation who presents with left-sided deficits that started around 3 PM. Pt found to have occluded R MCA M2 and underwent emergent thrombectomy va cerebral arteriogram. PMH: ETOH abuse, prostate cancer, stroke.    PT Comments    Pt making good progress.  Demonstrates improved awareness of surroundings and ability to avoid objects when walking.  Pt was able to identify objects on R today.  Pt scored a 21 on DGI indicating low fall risk.  Cont POC.    Follow Up Recommendations  Home health PT;Supervision - Intermittent     Equipment Recommendations  None recommended by PT    Recommendations for Other Services       Precautions / Restrictions Precautions Precautions: Fall Precaution Comments: R hemiopnopsia Restrictions Weight Bearing Restrictions: No    Mobility  Bed Mobility Overal bed mobility: Needs Assistance Bed Mobility: Supine to Sit;Sit to Supine     Supine to sit: Supervision Sit to supine: Supervision      Transfers Overall transfer level: Needs assistance Equipment used: None Transfers: Sit to/from Stand Sit to Stand: Supervision         General transfer comment: steady, no LOB; able to perform toielting ADLs independently  Ambulation/Gait Ambulation/Gait assistance: Supervision Gait Distance (Feet): 300 Feet Assistive device: None Gait Pattern/deviations: Step-through pattern;WFL(Within Functional Limits)     General Gait Details: Pt was able to navigate in room and in narrow areas without running into items.  He was able to identify obstacles on his right (and left) and avoid.  Steady, no LOB   Stairs Stairs: Yes Stairs assistance: Min guard Stair Management: No rails;Alternating pattern Number of Stairs:  5     Wheelchair Mobility    Modified Rankin (Stroke Patients Only) Modified Rankin (Stroke Patients Only) Pre-Morbid Rankin Score: Slight disability Modified Rankin: Moderate disability     Balance Overall balance assessment: Needs assistance Sitting-balance support: Feet supported Sitting balance-Leahy Scale: Normal     Standing balance support: During functional activity;No upper extremity supported Standing balance-Leahy Scale: Good                   Standardized Balance Assessment Standardized Balance Assessment : Dynamic Gait Index   Dynamic Gait Index Level Surface: Normal Change in Gait Speed: Normal Gait with Horizontal Head Turns: Normal Gait with Vertical Head Turns: Normal Gait and Pivot Turn: Mild Impairment Step Over Obstacle: Mild Impairment Step Around Obstacles: Mild Impairment Steps: Normal Total Score: 21      Cognition Arousal/Alertness: Awake/alert Behavior During Therapy: WFL for tasks assessed/performed Overall Cognitive Status: Within Functional Limits for tasks assessed                                        Exercises      General Comments General comments (skin integrity, edema, etc.): VSS;  Pt able to complete EOEM and identify items on R side.  He does report his R peripheral vision does seem decreased, but was able to avoid objects and find objects on his right today.      Pertinent Vitals/Pain Pain Assessment: No/denies pain    Home Living  Prior Function            PT Goals (current goals can now be found in the care plan section) Acute Rehab PT Goals Patient Stated Goal: to go home PT Goal Formulation: With patient Time For Goal Achievement: 10/11/19 Potential to Achieve Goals: Good Progress towards PT goals: Progressing toward goals    Frequency    Min 4X/week      PT Plan Current plan remains appropriate    Co-evaluation              AM-PAC PT  "6 Clicks" Mobility   Outcome Measure  Help needed turning from your back to your side while in a flat bed without using bedrails?: None Help needed moving from lying on your back to sitting on the side of a flat bed without using bedrails?: None Help needed moving to and from a bed to a chair (including a wheelchair)?: None Help needed standing up from a chair using your arms (e.g., wheelchair or bedside chair)?: None Help needed to walk in hospital room?: None Help needed climbing 3-5 steps with a railing? : None 6 Click Score: 24    End of Session Equipment Utilized During Treatment: Gait belt Activity Tolerance: Patient tolerated treatment well Patient left: in bed;with call bell/phone within reach;with bed alarm set(sitting EOB) Nurse Communication: Mobility status PT Visit Diagnosis: Muscle weakness (generalized) (M62.81);Difficulty in walking, not elsewhere classified (R26.2)     Time: 1040-1105 PT Time Calculation (min) (ACUTE ONLY): 25 min  Charges:  $Gait Training: 8-22 mins $Therapeutic Activity: 8-22 mins                     Maggie Font, PT Acute Rehab Services Pager [phone] [facility] [phone]    Karlton Lemon 09/28/2019, 11:39 AM

## 2019-09-28 NOTE — TOC Initial Note (Signed)
Transition of Care [facility]) - Initial/Assessment Note    Patient Details  Name: Gregory Perry MRN: [id number] Date of Birth: 1951-04-24  Transition of Care [facility]) CM/SW Contact:    Pollie Friar, RN Phone Number: 09/28/2019, 11:54 AM  Clinical Narrative:                 Pt lives at home with brother: Duffy and sister: Estill Bamberg. Pt states his brother would be able to provide 24 hour supervision at home. CM reached out to Duffy [phone]) and he agrees with the supervision.  Pt verbalizes he has been noncompliant with his medications at home and states he will definitely do better.  3 in 1 to be delivered to the room per [facility].  Pt provided choice for [facility] and [facility] selected. Cassie with [facility] accepted the referral.  CM provided pt SCAT documents/app. He usually does the driving and wont be able to after d/c.  Pt has transportation home when medically ready.   Expected Discharge Plan: [place] Barriers to Discharge: Continued Medical Work up   Patient Goals and CMS Choice   CMS Medicare.gov Compare Post Acute Care list provided to:: Patient Choice offered to / list presented to : Patient  Expected Discharge Plan and Services Expected Discharge Plan: [place]   Discharge Planning Services: CM Consult Post Acute Care Choice: Home Health, Durable Medical Equipment Living arrangements for the past 2 months: Apartment                 DME Arranged: 3-N-1 DME Agency: [facility] Date DME Agency Contacted: 09/28/19   Representative spoke with at DME Agency: [place]: [facility] [place] Date [place]: 09/28/19   Representative spoke with at [place]: Cassie  Prior Living Arrangements/Services Living arrangements for the past 2 months: [place] with:: Siblings Patient language and need for interpreter reviewed:: Yes Do you feel safe going back to the place where you live?: Yes      Need for Family  Participation in Patient Care: Yes (Comment) Care giver support system in place?: Yes (comment)(brother is able to provide 24 hour supervision)   Criminal Activity/Legal Involvement Pertinent to Current Situation/Hospitalization: No - Comment as needed  Activities of Daily Living Home Assistive Devices/Equipment: None ADL Screening (condition at time of admission) Patient's cognitive ability adequate to safely complete daily activities?: Yes Is the patient deaf or have difficulty hearing?: No Does the patient have difficulty seeing, even when wearing glasses/contacts?: Yes Does the patient have difficulty concentrating, remembering, or making decisions?: No Patient able to express need for assistance with ADLs?: Yes Does the patient have difficulty dressing or bathing?: No Independently performs ADLs?: No Communication: Independent Dressing (OT): Independent Grooming: Independent Feeding: Independent Bathing: Independent Toileting: Independent In/Out Bed: Needs assistance Is this a change from baseline?: Change from baseline, expected to last <3 days Walks in Home: Independent Does the patient have difficulty walking or climbing stairs?: No Weakness of Legs: Left Weakness of Arms/Hands: Left  Permission Sought/Granted                  Emotional Assessment Appearance:: Appears stated age Attitude/Demeanor/Rapport: Engaged Affect (typically observed): Accepting, Pleasant Orientation: : Oriented to Place, Oriented to  Time, Oriented to Situation   Psych Involvement: No (comment)  Admission diagnosis:  Stroke [facility]) [I63.9] Stroke (cerebrum) [facility]) [I63.9] Patient Active Problem List   Diagnosis Date Noted  . Acute ischemic right MCA stroke (  [place]) 09/26/2019  . Malignant neoplasm of prostate ([place]) 01/19/2019  . Decreased appetite 10/15/2018  . Weight loss, unintentional 10/15/2018  . Generalized joint pain 10/15/2018  . Age-related cataract of both eyes 09/17/2018  .  Hyperlipidemia 08/05/2018  . Elevated PSA 08/05/2018  . Vision changes 08/05/2018  . Encounter for vitamin deficiency screening 08/05/2018  . Paroxysmal atrial fibrillation ([place]) 11/14/2017  . Abnormal nuclear stress test 01/17/2016  . Candida esophagitis ([place])   . Pre-operative cardiovascular examination, LVEF < 35% 11/20/2015  . Cryptogenic stroke ([place]) 11/17/2015  . Essential hypertension 11/17/2015  . GERD (gastroesophageal reflux disease) 11/17/2015  . Loss of weight 11/17/2015  . Tobacco abuse 11/17/2015  . Dysphagia 11/17/2015  . Protein-calorie malnutrition, severe 11/17/2015  . Cerebral infarction due to thrombosis of right posterior cerebral artery ([place])   . History of stroke   . Cardiomyopathy, ischemic   . Esophageal stricture   . Dysphasia 05/07/2011   PCP:  Azzie Glatter, FNP Pharmacy:   [facility] [place], [place] - [address] [place] [address] Phone: [phone] Fax: [phone]  [facility] [phone] - [place], [place] - [address] [place] [address] Phone: [phone] Fax: [place], [address] [place] [address] Phone: [phone] Fax: [phone]     Social Determinants of Health (SDOH) Interventions    Readmission Risk Interventions No flowsheet data found.

## 2019-09-28 NOTE — Plan of Care (Signed)

## 2019-09-28 NOTE — Progress Notes (Signed)
Pt states that he voided after foley cath taken out. No clear documentation of whether pt voided or foley emptied with out volume/occurence. Pt voided during this shift w/o issue.

## 2019-09-28 NOTE — Social Work (Signed)
CSW met with pt at bedside. CSW introduced self and explained her role. Pts brother was in the room, pt stated he was fine with brother being present for questions. CSW completed sbirt with pt.  Pt scored a 0 on the sbirt scale. Pt denied alcohol use. CSW inquired about substance use. Pt stated that he does not use substances. CSW informed pt that he was positive for Marijuana. Pt admitted that he does have marijuana occasionally but is around it often. CSW and pt discussed the effects of marijuana and his health. CSW offered pt resources to stop or quit smoking marijuana and pt declined.   Emeterio Reeve, Latanya Presser, [place] Social Worker [phone]

## 2019-09-28 NOTE — Discharge Summary (Addendum)
Stroke Discharge Summary  Patient ID: Gregory Perry   MRN: [id number]      DOB: Nov 05, 1950  Date of Admission: 09/25/2019 Date of Discharge: 09/28/2019  Attending Physician:  Rosalin Hawking, MD, Stroke MD Consultant(s):   Jacques Earthly MD pulmonary/intensive care Patient's PCP:  Azzie Glatter, FNP  DISCHARGE DIAGNOSIS:  Principal Problem:   Acute ischemic right MCA stroke [facility]) s/p mechanical thrombectomy Active Problems:   Essential hypertension   Tobacco abuse   History of stroke   Cardiomyopathy, ischemic   Hyperlipidemia   Anemia, iron deficiency   Acute blood loss anemia  Allergies as of 09/28/2019   No Known Allergies     Medication List    STOP taking these medications   amLODipine 2.5 MG tablet Commonly known as: NORVASC   megestrol 40 MG tablet Commonly known as: MEGACE   potassium chloride SA 20 MEQ tablet Commonly known as: KLOR-CON     TAKE these medications   apixaban 5 MG Tabs tablet Commonly known as: ELIQUIS Take 1 tablet (5 mg total) by mouth 2 (two) times daily.   atorvastatin 40 MG tablet Commonly known as: LIPITOR Take 1 tablet (40 mg total) by mouth daily. Start taking on: Sep 29, 2019 What changed:   how much to take  how to take this  when to take this  additional instructions   Blood Pressure Cuff Misc Check blood pressure daily between 8 pm -9 pm   docusate sodium 100 MG capsule Commonly known as: COLACE Take 1 capsule (100 mg total) by mouth 2 (two) times daily as needed for mild constipation.   feeding supplement (ENSURE ENLIVE) Liqd Take 237 mLs by mouth 2 (two) times daily between meals.   ferrous sulfate 325 (65 FE) MG tablet Take 1 tablet (325 mg total) by mouth 3 (three) times daily with meals. What changed: when to take this   lisinopril 40 MG tablet Commonly known as: ZESTRIL Take 1 tablet (40 mg total) by mouth daily.   metoprolol tartrate 25 MG tablet Commonly known as: LOPRESSOR Take 1 tablet (25 mg  total) by mouth 2 (two) times daily.   pantoprazole 40 MG tablet Commonly known as: PROTONIX Take 1 tablet (40 mg total) by mouth daily. Start taking on: Sep 29, 2019 What changed: when to take this   polyethylene glycol 17 g packet Commonly known as: MIRALAX / GLYCOLAX Take 17 g by mouth daily as needed for moderate constipation.   spironolactone 25 MG tablet Commonly known as: ALDACTONE Take 1 tablet (25 mg total) by mouth daily. Start taking on: Sep 29, 2019            Durable Medical Equipment  (From admission, onward)         Start     Ordered   09/28/19 1048  For home use only DME 3 n 1  Once     09/28/19 1047          LABORATORY STUDIES CBC    Component Value Date/Time   WBC 5.8 09/28/2019 0314   RBC 4.17 (L) 09/28/2019 0314   HGB 9.7 (L) 09/28/2019 0314   HGB 10.8 (L) 03/15/2019 1203   HCT 31.5 (L) 09/28/2019 0314   HCT 34.7 (L) 03/15/2019 1203   PLT 291 09/28/2019 0314   PLT 407 03/15/2019 1203   MCV 75.5 (L) 09/28/2019 0314   MCV 85 03/15/2019 1203   MCH 23.3 (L) 09/28/2019 0314   MCHC 30.8 09/28/2019  0314   RDW 20.3 (H) 09/28/2019 0314   RDW 15.0 03/15/2019 1203   LYMPHSABS 1.7 08/03/2018 0853   MONOABS 416 09/13/2016 0935   EOSABS 0.2 08/03/2018 0853   BASOSABS 0.0 08/03/2018 0853   CMP    Component Value Date/Time   NA 138 09/28/2019 0314   NA 140 03/15/2019 1203   K 3.4 (L) 09/28/2019 0314   CL 105 09/28/2019 0314   CO2 25 09/28/2019 0314   GLUCOSE 89 09/28/2019 0314   BUN 12 09/28/2019 0314   BUN 13 03/15/2019 1203   CREATININE 1.06 09/28/2019 0314   CREATININE 1.05 09/13/2016 0935   CALCIUM 8.9 09/28/2019 0314   PROT 7.8 08/03/2018 0853   ALBUMIN 4.2 08/03/2018 0853   AST 24 08/03/2018 0853   ALT 10 08/03/2018 0853   ALKPHOS 81 08/03/2018 0853   BILITOT 0.6 08/03/2018 0853   GFRNONAA >60 09/28/2019 0314   GFRNONAA 75 09/13/2016 0935   GFRAA >60 09/28/2019 0314   GFRAA 87 09/13/2016 0935   COAGS Lab Results  Component  Value Date   INR 1.0 09/26/2019   INR 1.06 01/17/2016   Lipid Panel    Component Value Date/Time   CHOL 192 09/26/2019 0624   CHOL 154 08/03/2018 0853   TRIG 53 09/26/2019 0624   TRIG 52 09/26/2019 0624   HDL 54 09/26/2019 0624   HDL 63 08/03/2018 0853   CHOLHDL 3.6 09/26/2019 0624   VLDL 11 09/26/2019 0624   LDLCALC 127 (H) 09/26/2019 0624   LDLCALC 81 08/03/2018 0853   HgbA1C  Lab Results  Component Value Date   HGBA1C 5.5 09/26/2019   Urinalysis    Component Value Date/Time   COLORURINE YELLOW 09/26/2019 0028   APPEARANCEUR CLEAR 09/26/2019 0028   LABSPEC 1.010 09/26/2019 0028   PHURINE 6.0 09/26/2019 0028   GLUCOSEU NEGATIVE 09/26/2019 0028   HGBUR NEGATIVE 09/26/2019 0028   BILIRUBINUR NEGATIVE 09/26/2019 0028   BILIRUBINUR negative 11/16/2018 0817   KETONESUR NEGATIVE 09/26/2019 0028   PROTEINUR NEGATIVE 09/26/2019 0028   UROBILINOGEN 0.2 11/16/2018 0817   UROBILINOGEN 1.0 08/05/2017 0821   NITRITE NEGATIVE 09/26/2019 0028   LEUKOCYTESUR NEGATIVE 09/26/2019 0028   Urine Drug Screen     Component Value Date/Time   LABOPIA NONE DETECTED 09/26/2019 1325   COCAINSCRNUR NONE DETECTED 09/26/2019 1325   LABBENZ NONE DETECTED 09/26/2019 1325   AMPHETMU NONE DETECTED 09/26/2019 1325   THCU POSITIVE (A) 09/26/2019 1325   LABBARB NONE DETECTED 09/26/2019 1325    Alcohol Level    Component Value Date/Time   ETH <10 09/26/2019 0056    SIGNIFICANT DIAGNOSTIC STUDIES CT Code Stroke CTA Head W/WO contrast  Result Date: 09/26/2019 CLINICAL DATA:  Right-sided gaze and neglect. EXAM: CT ANGIOGRAPHY HEAD AND NECK CT PERFUSION BRAIN TECHNIQUE: Multidetector CT imaging of the head and neck was performed using the standard protocol during bolus administration of intravenous contrast. Multiplanar CT image reconstructions and MIPs were obtained to evaluate the vascular anatomy. Carotid stenosis measurements (when applicable) are obtained utilizing NASCET criteria, using the  distal internal carotid diameter as the denominator. Multiphase CT imaging of the brain was performed following IV bolus contrast injection. Subsequent parametric perfusion maps were calculated using RAPID software. CONTRAST:  111mL OMNIPAQUE IOHEXOL 350 MG/ML SOLN COMPARISON:  Head CT 09/26/2019 CTA neck 11/17/2015 FINDINGS: CTA NECK FINDINGS SKELETON: There is no bony spinal canal stenosis. No lytic or blastic lesion. OTHER NECK: Normal pharynx, larynx and major salivary glands. No cervical lymphadenopathy. Unremarkable thyroid  gland. UPPER CHEST: No pneumothorax or pleural effusion. No nodules or masses. AORTIC ARCH: There is no calcific atherosclerosis of the aortic arch. There is no aneurysm, dissection or hemodynamically significant stenosis of the visualized portion of the aorta. Conventional 3 vessel aortic branching pattern. The visualized proximal subclavian arteries are widely patent. RIGHT CAROTID SYSTEM: No dissection, occlusion or aneurysm. Mild atherosclerotic calcification at the carotid bifurcation without hemodynamically significant stenosis. LEFT CAROTID SYSTEM: No dissection, occlusion or aneurysm. Mild atherosclerotic calcification at the carotid bifurcation without hemodynamically significant stenosis. VERTEBRAL ARTERIES: Left dominant configuration. Both origins are clearly patent. There is no dissection, occlusion or flow-limiting stenosis to the skull base (V1-V3 segments). CTA HEAD FINDINGS POSTERIOR CIRCULATION: --Vertebral arteries: Normal V4 segments. --Posterior inferior cerebellar arteries (PICA): Patent origins from the vertebral arteries. --Anterior inferior cerebellar arteries (AICA): Patent origins from the basilar artery. --Basilar artery: Normal. --Superior cerebellar arteries: Normal. --Posterior cerebral arteries: Normal. Both originate from the basilar artery. Posterior communicating arteries (p-comm) are diminutive or absent. ANTERIOR CIRCULATION: --Intracranial internal  carotid arteries: Normal. --Anterior cerebral arteries (ACA): Normal. Both A1 segments are present. Patent anterior communicating artery (a-comm). --Middle cerebral arteries (MCA): Occlusion of the right MCA distal M2 segment corresponding to the region of ischemia identified below. Left M2 occlusion is favored to be chronic. VENOUS SINUSES: As permitted by contrast timing, patent. ANATOMIC VARIANTS: None Review of the MIP images confirms the above findings. CT Brain Perfusion Findings: ASPECTS: 10 CBF (<30%) Volume: 48mL Perfusion (Tmax>6.0s) volume: 40mL Mismatch Volume: 14mL Infarction Location:The identified region of core infarct corresponds to a chronic infarct in the right frontal lobe. IMPRESSION: 1. Occlusion of the right MCA distal M2 segment, corresponding to the region of ischemia in the posterior right MCA territory. 2. Chronic left M2 occlusion. 3. 36 mL area at risk ischemic brain according to perfusion analysis. The calculated core infarct is artifactual and relates to a remote right frontal lobe infarct. Critical Value/emergent results were called by telephone at the time of interpretation on 09/26/2019 at 1:46 am to provider MCNEILL [facility] , who verbally acknowledged these results. Electronically Signed   By: Ulyses Jarred M.D.   On: 09/26/2019 01:46   CT Code Stroke CTA Neck W/WO contrast  Result Date: 09/26/2019 CLINICAL DATA:  Right-sided gaze and neglect. EXAM: CT ANGIOGRAPHY HEAD AND NECK CT PERFUSION BRAIN TECHNIQUE: Multidetector CT imaging of the head and neck was performed using the standard protocol during bolus administration of intravenous contrast. Multiplanar CT image reconstructions and MIPs were obtained to evaluate the vascular anatomy. Carotid stenosis measurements (when applicable) are obtained utilizing NASCET criteria, using the distal internal carotid diameter as the denominator. Multiphase CT imaging of the brain was performed following IV bolus contrast injection.  Subsequent parametric perfusion maps were calculated using RAPID software. CONTRAST:  129mL OMNIPAQUE IOHEXOL 350 MG/ML SOLN COMPARISON:  Head CT 09/26/2019 CTA neck 11/17/2015 FINDINGS: CTA NECK FINDINGS SKELETON: There is no bony spinal canal stenosis. No lytic or blastic lesion. OTHER NECK: Normal pharynx, larynx and major salivary glands. No cervical lymphadenopathy. Unremarkable thyroid gland. UPPER CHEST: No pneumothorax or pleural effusion. No nodules or masses. AORTIC ARCH: There is no calcific atherosclerosis of the aortic arch. There is no aneurysm, dissection or hemodynamically significant stenosis of the visualized portion of the aorta. Conventional 3 vessel aortic branching pattern. The visualized proximal subclavian arteries are widely patent. RIGHT CAROTID SYSTEM: No dissection, occlusion or aneurysm. Mild atherosclerotic calcification at the carotid bifurcation without hemodynamically significant stenosis. LEFT CAROTID SYSTEM: No  dissection, occlusion or aneurysm. Mild atherosclerotic calcification at the carotid bifurcation without hemodynamically significant stenosis. VERTEBRAL ARTERIES: Left dominant configuration. Both origins are clearly patent. There is no dissection, occlusion or flow-limiting stenosis to the skull base (V1-V3 segments). CTA HEAD FINDINGS POSTERIOR CIRCULATION: --Vertebral arteries: Normal V4 segments. --Posterior inferior cerebellar arteries (PICA): Patent origins from the vertebral arteries. --Anterior inferior cerebellar arteries (AICA): Patent origins from the basilar artery. --Basilar artery: Normal. --Superior cerebellar arteries: Normal. --Posterior cerebral arteries: Normal. Both originate from the basilar artery. Posterior communicating arteries (p-comm) are diminutive or absent. ANTERIOR CIRCULATION: --Intracranial internal carotid arteries: Normal. --Anterior cerebral arteries (ACA): Normal. Both A1 segments are present. Patent anterior communicating artery  (a-comm). --Middle cerebral arteries (MCA): Occlusion of the right MCA distal M2 segment corresponding to the region of ischemia identified below. Left M2 occlusion is favored to be chronic. VENOUS SINUSES: As permitted by contrast timing, patent. ANATOMIC VARIANTS: None Review of the MIP images confirms the above findings. CT Brain Perfusion Findings: ASPECTS: 10 CBF (<30%) Volume: 54mL Perfusion (Tmax>6.0s) volume: 43mL Mismatch Volume: 41mL Infarction Location:The identified region of core infarct corresponds to a chronic infarct in the right frontal lobe. IMPRESSION: 1. Occlusion of the right MCA distal M2 segment, corresponding to the region of ischemia in the posterior right MCA territory. 2. Chronic left M2 occlusion. 3. 36 mL area at risk ischemic brain according to perfusion analysis. The calculated core infarct is artifactual and relates to a remote right frontal lobe infarct. Critical Value/emergent results were called by telephone at the time of interpretation on 09/26/2019 at 1:46 am to provider MCNEILL [facility] , who verbally acknowledged these results. Electronically Signed   By: Ulyses Jarred M.D.   On: 09/26/2019 01:46   MR ANGIO HEAD WO CONTRAST  Result Date: 09/26/2019 CLINICAL DATA:  Stroke.  Right M2 thrombectomy 09/26/2019 EXAM: MRI HEAD WITHOUT CONTRAST MRA HEAD WITHOUT CONTRAST TECHNIQUE: Multiplanar, multiecho pulse sequences of the brain and surrounding structures were obtained without intravenous contrast. Angiographic images of the head were obtained using MRA technique without contrast. COMPARISON:  CTA and CT perfusion 09/26/2019 FINDINGS: MRI HEAD FINDINGS Brain: Small subcentimeter areas of acute infarct involving the right frontal white matter, right posterior insula, and right posterior parietal lobe. Chronic infarct right frontal parietal lobe. Chronic hemorrhagic infarct left occipital lobe. Moderate chronic microvascular ischemic changes in the white matter. Chronic ischemic  change in the thalamus and cerebellum bilaterally. Negative for mass lesion. Mild atrophy.  Negative for hydrocephalus. Vascular: Normal arterial flow voids Skull and upper cervical spine: No focal skeletal lesion. Sinuses/Orbits: Mild mucosal edema paranasal sinuses. Bilateral cataract extraction Other: None MRA HEAD FINDINGS Both vertebral arteries patent to the basilar. PICA not visualized. Large AICA patent bilaterally. Basilar patent with irregularity distally which is felt to be motion related. Right posterior cerebral artery patent with artifact in the proximal segment. Stenosis versus artifact proximal left PCA. Occlusion distal left PCA corresponding to chronic infarction. Atherosclerotic irregularity in the cavernous carotid bilaterally without significant stenosis. Mild stenosis proximal A1 segment bilaterally. Mild to moderate stenosis A2 segment bilaterally. Right M1 segment is widely patent. Previously noted occlusion of the right M2 branch is now patent. There is irregularity at the site of previous occlusion which may be due to plaque or residual clot. There is good distal flow. Left middle cerebral artery patent without significant stenosis. Negative for aneurysm. IMPRESSION: 1. Small subcentimeter areas of acute infarct in the right frontal white matter, right posterior insula, and right posterior parietal  lobe. 2. Chronic infarct right frontal parietal lobe. Chronic hemorrhagic left PCA infarct. 3. Right M2 segment which was previously occluded is now patent with mild residual irregularity at the previous occlusion site. Electronically Signed   By: Franchot Gallo M.D.   On: 09/26/2019 18:31   MR BRAIN WO CONTRAST  Result Date: 09/26/2019 CLINICAL DATA:  Stroke.  Right M2 thrombectomy 09/26/2019 EXAM: MRI HEAD WITHOUT CONTRAST MRA HEAD WITHOUT CONTRAST TECHNIQUE: Multiplanar, multiecho pulse sequences of the brain and surrounding structures were obtained without intravenous contrast.  Angiographic images of the head were obtained using MRA technique without contrast. COMPARISON:  CTA and CT perfusion 09/26/2019 FINDINGS: MRI HEAD FINDINGS Brain: Small subcentimeter areas of acute infarct involving the right frontal white matter, right posterior insula, and right posterior parietal lobe. Chronic infarct right frontal parietal lobe. Chronic hemorrhagic infarct left occipital lobe. Moderate chronic microvascular ischemic changes in the white matter. Chronic ischemic change in the thalamus and cerebellum bilaterally. Negative for mass lesion. Mild atrophy.  Negative for hydrocephalus. Vascular: Normal arterial flow voids Skull and upper cervical spine: No focal skeletal lesion. Sinuses/Orbits: Mild mucosal edema paranasal sinuses. Bilateral cataract extraction Other: None MRA HEAD FINDINGS Both vertebral arteries patent to the basilar. PICA not visualized. Large AICA patent bilaterally. Basilar patent with irregularity distally which is felt to be motion related. Right posterior cerebral artery patent with artifact in the proximal segment. Stenosis versus artifact proximal left PCA. Occlusion distal left PCA corresponding to chronic infarction. Atherosclerotic irregularity in the cavernous carotid bilaterally without significant stenosis. Mild stenosis proximal A1 segment bilaterally. Mild to moderate stenosis A2 segment bilaterally. Right M1 segment is widely patent. Previously noted occlusion of the right M2 branch is now patent. There is irregularity at the site of previous occlusion which may be due to plaque or residual clot. There is good distal flow. Left middle cerebral artery patent without significant stenosis. Negative for aneurysm. IMPRESSION: 1. Small subcentimeter areas of acute infarct in the right frontal white matter, right posterior insula, and right posterior parietal lobe. 2. Chronic infarct right frontal parietal lobe. Chronic hemorrhagic left PCA infarct. 3. Right M2 segment  which was previously occluded is now patent with mild residual irregularity at the previous occlusion site. Electronically Signed   By: Franchot Gallo M.D.   On: 09/26/2019 18:31   IR CT Head Ltd  Result Date: 09/26/2019 INDICATION: 69 year old male presenting with acute right hemispheric ischemic stroke, right M2 occlusion, presenting for cerebral angiogram and attempt for thrombectomy EXAM: ULTRASOUND GUIDED ACCESS RIGHT COMMON FEMORAL ARTERY CERVICAL AND CEREBRAL ANGIOGRAM MECHANICAL THROMBECTOMY OF RIGHT MCA TERRITORY FLAT PANEL CT DEPLOYMENT OF ANGIO-SEAL COMPARISON:  CT IMAGING OF THE SAME DAY MEDICATIONS: 2 g Ancef. The antibiotic was administered within 1 hour of the procedure ANESTHESIA/SEDATION: General endotracheal tube anesthesia CONTRAST:  60 cc Omnipaque 300 FLUOROSCOPY TIME:  Fluoroscopy Time: 8 minutes 18 seconds (1675 mGy). COMPLICATIONS: None TECHNIQUE: Informed written consent was obtained from the patient's family after a thorough discussion of the procedural risks, benefits and alternatives. Specific risks discussed include: Bleeding, infection, contrast reaction, kidney injury/failure, need for further procedure/surgery, arterial injury or dissection, embolization to new territory, intracranial hemorrhage (10-15% risk), neurologic deterioration, cardiopulmonary collapse, death. All questions were addressed. Maximal Sterile Barrier Technique was utilized including during the procedure including caps, mask, sterile gowns, sterile gloves, sterile drape, hand hygiene and skin antiseptic. A timeout was performed prior to the initiation of the procedure. FINDINGS: Initial Findings: Right common carotid artery:  Normal course  caliber and contour. Right external carotid artery: Patent with antegrade flow. Right internal carotid artery: Normal course caliber and contour of the cervical portion. Vertical and petrous segment patent with normal course caliber contour. Cavernous segment patent. Clinoid  segment patent. Antegrade flow of the ophthalmic artery. Ophthalmic segment patent. Terminus patent. Right MCA: M1 segment patent. Early temporal branch from the proximal MCA. Insular segments patent. Initial angiogram demonstrates angiographic cut off of superior division into the parietal territory, within the insular/operculum transition. TICI 0 flow beyond the occlusion. Right ACA: A 1 segment patent. A 2 segment perfuses the right territory. Patent anterior communicating artery. Completion Findings: Right MCA: After single pass thrombectomy, there is complete restoration of flow through the occluded segment to the superior segment. TICI 3 flow restored Flat panel CT: No contrast staining or hemorrhage identified on the postcontrast CT. Redemonstration of remote infarcts of bilateral hemispheres, left PCA/inferior division territory, and on the right the likely remote infarction of the perirolandic parietal region. PROCEDURE: The anesthesia team was present to provide general endotracheal tube anesthesia and for patient monitoring during the procedure. Intubation was performed in negative pressure [place] in neuro IR holding. Interventional neuro radiology nursing staff was also present. Ultrasound survey of the right inguinal region was performed with images stored and sent to PACs. 11 blade scalpel was used to make a small incision. Blunt dissection was performed with US guidance. A micropuncture needle was used access the right common femoral artery under ultrasound. With excellent arterial blood flow returned, an .018 micro wire was passed through the needle, observed to enter the abdominal aorta under fluoroscopy. The needle was removed, and a micropuncture sheath was placed over the wire. The inner dilator and wire were removed, and an 035 wire was advanced under fluoroscopy into the abdominal aorta. The sheath was removed and a 25cm 83F straight vascular sheath was placed. The dilator was removed and the  sheath was flushed. Sheath was attached to pressurized and heparinized saline bag for constant forward flow. A coaxial system was then advanced over the 035 wire. This included a 95cm 087 "Walrus" balloon guide with coaxial 125cm Berenstein diagnostic catheter. This was advanced to the proximal descending thoracic aorta. Wire was then removed. Double flush of the catheter was performed. Catheter was then used to select the innominate artery. Angiogram was performed. Using roadmap technique, the catheter was advanced over a standard glide wire into right cervical ICA, with distal position achieved of the balloon guide. The diagnostic catheter and the wire were removed. Formal angiogram was performed. Road map function was used once the occluded vessel was identified. Copious back flush was performed and the balloon catheter was attached to heparinized and pressurized saline bag for forward flow. A second coaxial system was then advanced through the balloon catheter, which included the selected intermediate catheter, microcatheter, and microwire. In this scenario, the set up included a 115cm CAT-5 intermediate catheter, a Trevo Provue18 microcatheter, and 014 synchro soft wire. This system was advanced through the balloon guide catheter under the road-map function, with adequate back-flush at the rotating hemostatic valve at that back end of the balloon guide. Once the wire and microcatheter were into the laceral segment, we attach heparinized saline pressured flush to the guide. Microcatheter and the intermediate catheter system were advanced through the terminal ICA and MCA to the level of the occlusion. The micro wire was then carefully advanced through the occluded segment. Microcatheter was then manipulated through the occluded segment and the wire was  removed with saline drip at the hub. Blood was then aspirated through the hub of the microcatheter, and a gentle contrast injection was performed confirming  intraluminal position. A rotating hemostatic valve was then attached to the back end of the microcatheter, and a pressurized and heparinized saline bag was attached to the catheter. 3 x 20 solitaire device was then selected. Back flush was achieved at the rotating hemostatic valve, and then the device was gently advanced through the microcatheter to the distal end. The retriever was then unsheathed by withdrawing the microcatheter under fluoroscopy. Once the retriever was completely unsheathed, control angiogram was performed from the balloon catheter. A 4 minute time interval was observed. The balloon at the balloon guide catheter was then inflated under fluoroscopy for proximal flow arrest. Constant aspiration was then performed at the CAT-5 intermediate catheter, as the retriever was gently and slowly withdrawn with fluoroscopic observation. Once the retriever was entirely within the tip of the intermediate catheter, both were removed from the system. Free aspiration was confirmed at the hub of the balloon guide catheter, with free blood return confirmed. The balloon was then deflated, and a control angiogram was performed. Restoration of flow was confirmed. The skin at the puncture site was then cleaned with Hibiclens. The 8 French sheath was removed and an 1F angioseal was deployed. Flat panel CT was performed. Patient tolerated the procedure well and remained hemodynamically stable throughout. No complications were encountered and no significant blood loss encountered. IMPRESSION: Status post ultrasound guided access right common femoral artery for cervical and cerebral angiogram and mechanical thrombectomy of right MCA/M2 occlusion, with single pass, restoration of TICI-3 flow and deployment of Angio-Seal. Signed, Dulcy Fanny. Dellia Nims, RPVI [facility] Inter[facility] Radiology Special[facility] Radiology PLAN: The patient will remain intubated, as he remains COVID status unknown ICU status Target  systolic blood pressure of 120-140 Right hip straight time 6 hours Frequent neurovascular checks Repeat neurologic imaging with CT and/MRI at the discretion of neurology team Electronically Signed   By: Corrie Mckusick D.O.   On: 09/26/2019 04:31   IR US Guide Vasc Access Right  Result Date: 09/26/2019 INDICATION: 69 year old male presenting with acute right hemispheric ischemic stroke, right M2 occlusion, presenting for cerebral angiogram and attempt for thrombectomy EXAM: ULTRASOUND GUIDED ACCESS RIGHT COMMON FEMORAL ARTERY CERVICAL AND CEREBRAL ANGIOGRAM MECHANICAL THROMBECTOMY OF RIGHT MCA TERRITORY FLAT PANEL CT DEPLOYMENT OF ANGIO-SEAL COMPARISON:  CT IMAGING OF THE SAME DAY MEDICATIONS: 2 g Ancef. The antibiotic was administered within 1 hour of the procedure ANESTHESIA/SEDATION: General endotracheal tube anesthesia CONTRAST:  60 cc Omnipaque 300 FLUOROSCOPY TIME:  Fluoroscopy Time: 8 minutes 18 seconds (1675 mGy). COMPLICATIONS: None TECHNIQUE: Informed written consent was obtained from the patient's family after a thorough discussion of the procedural risks, benefits and alternatives. Specific risks discussed include: Bleeding, infection, contrast reaction, kidney injury/failure, need for further procedure/surgery, arterial injury or dissection, embolization to new territory, intracranial hemorrhage (10-15% risk), neurologic deterioration, cardiopulmonary collapse, death. All questions were addressed. Maximal Sterile Barrier Technique was utilized including during the procedure including caps, mask, sterile gowns, sterile gloves, sterile drape, hand hygiene and skin antiseptic. A timeout was performed prior to the initiation of the procedure. FINDINGS: Initial Findings: Right common carotid artery:  Normal course caliber and contour. Right external carotid artery: Patent with antegrade flow. Right internal carotid artery: Normal course caliber and contour of the cervical portion. Vertical and petrous  segment patent with normal course caliber contour. Cavernous segment patent. Clinoid segment patent.  Antegrade flow of the ophthalmic artery. Ophthalmic segment patent. Terminus patent. Right MCA: M1 segment patent. Early temporal branch from the proximal MCA. Insular segments patent. Initial angiogram demonstrates angiographic cut off of superior division into the parietal territory, within the insular/operculum transition. TICI 0 flow beyond the occlusion. Right ACA: A 1 segment patent. A 2 segment perfuses the right territory. Patent anterior communicating artery. Completion Findings: Right MCA: After single pass thrombectomy, there is complete restoration of flow through the occluded segment to the superior segment. TICI 3 flow restored Flat panel CT: No contrast staining or hemorrhage identified on the postcontrast CT. Redemonstration of remote infarcts of bilateral hemispheres, left PCA/inferior division territory, and on the right the likely remote infarction of the perirolandic parietal region. PROCEDURE: The anesthesia team was present to provide general endotracheal tube anesthesia and for patient monitoring during the procedure. Intubation was performed in negative pressure [place] in neuro IR holding. Interventional neuro radiology nursing staff was also present. Ultrasound survey of the right inguinal region was performed with images stored and sent to PACs. 11 blade scalpel was used to make a small incision. Blunt dissection was performed with US guidance. A micropuncture needle was used access the right common femoral artery under ultrasound. With excellent arterial blood flow returned, an .018 micro wire was passed through the needle, observed to enter the abdominal aorta under fluoroscopy. The needle was removed, and a micropuncture sheath was placed over the wire. The inner dilator and wire were removed, and an 035 wire was advanced under fluoroscopy into the abdominal aorta. The sheath was removed and  a 25cm 55F straight vascular sheath was placed. The dilator was removed and the sheath was flushed. Sheath was attached to pressurized and heparinized saline bag for constant forward flow. A coaxial system was then advanced over the 035 wire. This included a 95cm 087 "Walrus" balloon guide with coaxial 125cm Berenstein diagnostic catheter. This was advanced to the proximal descending thoracic aorta. Wire was then removed. Double flush of the catheter was performed. Catheter was then used to select the innominate artery. Angiogram was performed. Using roadmap technique, the catheter was advanced over a standard glide wire into right cervical ICA, with distal position achieved of the balloon guide. The diagnostic catheter and the wire were removed. Formal angiogram was performed. Road map function was used once the occluded vessel was identified. Copious back flush was performed and the balloon catheter was attached to heparinized and pressurized saline bag for forward flow. A second coaxial system was then advanced through the balloon catheter, which included the selected intermediate catheter, microcatheter, and microwire. In this scenario, the set up included a 115cm CAT-5 intermediate catheter, a Trevo Provue18 microcatheter, and 014 synchro soft wire. This system was advanced through the balloon guide catheter under the road-map function, with adequate back-flush at the rotating hemostatic valve at that back end of the balloon guide. Once the wire and microcatheter were into the laceral segment, we attach heparinized saline pressured flush to the guide. Microcatheter and the intermediate catheter system were advanced through the terminal ICA and MCA to the level of the occlusion. The micro wire was then carefully advanced through the occluded segment. Microcatheter was then manipulated through the occluded segment and the wire was removed with saline drip at the hub. Blood was then aspirated through the hub of the  microcatheter, and a gentle contrast injection was performed confirming intraluminal position. A rotating hemostatic valve was then attached to the back end of the  microcatheter, and a pressurized and heparinized saline bag was attached to the catheter. 3 x 20 solitaire device was then selected. Back flush was achieved at the rotating hemostatic valve, and then the device was gently advanced through the microcatheter to the distal end. The retriever was then unsheathed by withdrawing the microcatheter under fluoroscopy. Once the retriever was completely unsheathed, control angiogram was performed from the balloon catheter. A 4 minute time interval was observed. The balloon at the balloon guide catheter was then inflated under fluoroscopy for proximal flow arrest. Constant aspiration was then performed at the CAT-5 intermediate catheter, as the retriever was gently and slowly withdrawn with fluoroscopic observation. Once the retriever was entirely within the tip of the intermediate catheter, both were removed from the system. Free aspiration was confirmed at the hub of the balloon guide catheter, with free blood return confirmed. The balloon was then deflated, and a control angiogram was performed. Restoration of flow was confirmed. The skin at the puncture site was then cleaned with Hibiclens. The 8 French sheath was removed and an 35F angioseal was deployed. Flat panel CT was performed. Patient tolerated the procedure well and remained hemodynamically stable throughout. No complications were encountered and no significant blood loss encountered. IMPRESSION: Status post ultrasound guided access right common femoral artery for cervical and cerebral angiogram and mechanical thrombectomy of right MCA/M2 occlusion, with single pass, restoration of TICI-3 flow and deployment of Angio-Seal. Signed, Dulcy Fanny. Dellia Nims, RPVI Vascular and Interventional Radiology Specialists [facility] Radiology PLAN: The patient will remain  intubated, as he remains COVID status unknown ICU status Target systolic blood pressure of 120-140 Right hip straight time 6 hours Frequent neurovascular checks Repeat neurologic imaging with CT and/MRI at the discretion of neurology team Electronically Signed   By: Corrie Mckusick D.O.   On: 09/26/2019 04:31   CT Code Stroke Cerebral Perfusion with contrast  Result Date: 09/26/2019 CLINICAL DATA:  Right-sided gaze and neglect. EXAM: CT ANGIOGRAPHY HEAD AND NECK CT PERFUSION BRAIN TECHNIQUE: Multidetector CT imaging of the head and neck was performed using the standard protocol during bolus administration of intravenous contrast. Multiplanar CT image reconstructions and MIPs were obtained to evaluate the vascular anatomy. Carotid stenosis measurements (when applicable) are obtained utilizing NASCET criteria, using the distal internal carotid diameter as the denominator. Multiphase CT imaging of the brain was performed following IV bolus contrast injection. Subsequent parametric perfusion maps were calculated using RAPID software. CONTRAST:  138mL OMNIPAQUE IOHEXOL 350 MG/ML SOLN COMPARISON:  Head CT 09/26/2019 CTA neck 11/17/2015 FINDINGS: CTA NECK FINDINGS SKELETON: There is no bony spinal canal stenosis. No lytic or blastic lesion. OTHER NECK: Normal pharynx, larynx and major salivary glands. No cervical lymphadenopathy. Unremarkable thyroid gland. UPPER CHEST: No pneumothorax or pleural effusion. No nodules or masses. AORTIC ARCH: There is no calcific atherosclerosis of the aortic arch. There is no aneurysm, dissection or hemodynamically significant stenosis of the visualized portion of the aorta. Conventional 3 vessel aortic branching pattern. The visualized proximal subclavian arteries are widely patent. RIGHT CAROTID SYSTEM: No dissection, occlusion or aneurysm. Mild atherosclerotic calcification at the carotid bifurcation without hemodynamically significant stenosis. LEFT CAROTID SYSTEM: No dissection,  occlusion or aneurysm. Mild atherosclerotic calcification at the carotid bifurcation without hemodynamically significant stenosis. VERTEBRAL ARTERIES: Left dominant configuration. Both origins are clearly patent. There is no dissection, occlusion or flow-limiting stenosis to the skull base (V1-V3 segments). CTA HEAD FINDINGS POSTERIOR CIRCULATION: --Vertebral arteries: Normal V4 segments. --Posterior inferior cerebellar arteries (PICA): Patent origins from the  vertebral arteries. --Anterior inferior cerebellar arteries (AICA): Patent origins from the basilar artery. --Basilar artery: Normal. --Superior cerebellar arteries: Normal. --Posterior cerebral arteries: Normal. Both originate from the basilar artery. Posterior communicating arteries (p-comm) are diminutive or absent. ANTERIOR CIRCULATION: --Intracranial internal carotid arteries: Normal. --Anterior cerebral arteries (ACA): Normal. Both A1 segments are present. Patent anterior communicating artery (a-comm). --Middle cerebral arteries (MCA): Occlusion of the right MCA distal M2 segment corresponding to the region of ischemia identified below. Left M2 occlusion is favored to be chronic. VENOUS SINUSES: As permitted by contrast timing, patent. ANATOMIC VARIANTS: None Review of the MIP images confirms the above findings. CT Brain Perfusion Findings: ASPECTS: 10 CBF (<30%) Volume: 86mL Perfusion (Tmax>6.0s) volume: 57mL Mismatch Volume: 103mL Infarction Location:The identified region of core infarct corresponds to a chronic infarct in the right frontal lobe. IMPRESSION: 1. Occlusion of the right MCA distal M2 segment, corresponding to the region of ischemia in the posterior right MCA territory. 2. Chronic left M2 occlusion. 3. 36 mL area at risk ischemic brain according to perfusion analysis. The calculated core infarct is artifactual and relates to a remote right frontal lobe infarct. Critical Value/emergent results were called by telephone at the time of  interpretation on 09/26/2019 at 1:46 am to provider [facility] , who verbally acknowledged these results. Electronically Signed   By: Ulyses Jarred M.D.   On: 09/26/2019 01:46   DG CHEST PORT 1 VIEW  Result Date: 09/26/2019 CLINICAL DATA:  CVA EXAM: PORTABLE CHEST 1 VIEW COMPARISON:  08/04/2009 chest radiograph. FINDINGS: Endotracheal tube tip is 4.7 cm above the carina. Loop recorder device overlies the left heart. Stable cardiomediastinal silhouette with top-normal heart size. No pneumothorax. No pleural effusion. No overt pulmonary edema. Mild left basilar atelectasis. IMPRESSION: 1. Endotracheal tube tip is 4.7 cm above the carina. 2. Mild left basilar atelectasis. Electronically Signed   By: Ilona Sorrel M.D.   On: 09/26/2019 04:32   ECHOCARDIOGRAM COMPLETE  Result Date: 09/27/2019    ECHOCARDIOGRAM REPORT   Patient Name:   Gregory Perry Date of Exam: 09/27/2019 Medical Rec #:  [id number]         Height:       71.0 in Accession #:    [id number]        Weight:       165.6 lb Date of Birth:  10-May-1951          BSA:          1.946 m Patient Age:    40 years          BP:           114/69 mmHg Patient Gender: M                 HR:           61 bpm. Exam Location:  Inpatient Procedure: 2D Echo Indications:    stroke 434.91  History:        Patient has prior history of Echocardiogram examinations, most                 recent 05/15/2017.  Sonographer:    Johny Chess Referring Phys: Merlín.Osler MCNEILL P [place]  1. Left ventricular ejection fraction, by estimation, is 40 to 45%. The left ventricle has mildly decreased function. The left ventricle demonstrates global hypokinesis. There is mild left ventricular hypertrophy. Left ventricular diastolic parameters are consistent with Grade II diastolic dysfunction (pseudonormalization).  2. Right ventricular systolic function is normal.  The right ventricular size is normal. There is normal pulmonary artery systolic pressure.  3. Left atrial size  was mildly dilated.  4. The mitral valve is normal in structure. Trivial mitral valve regurgitation. No evidence of mitral stenosis.  5. The aortic valve is normal in structure. Aortic valve regurgitation is not visualized. No aortic stenosis is present.  6. The inferior vena cava is normal in size with greater than 50% respiratory variability, suggesting right atrial pressure of 3 mmHg. Comparison(s): The left ventricular hypertrophy has improved. Conclusion(s)/Recommendation(s): No intracardiac source of embolism detected on this transthoracic study. A transesophageal echocardiogram is recommended to exclude cardiac source of embolism if clinically indicated. FINDINGS  Left Ventricle: Left ventricular ejection fraction, by estimation, is 40 to 45%. The left ventricle has mildly decreased function. The left ventricle demonstrates global hypokinesis. The left ventricular internal cavity size was normal in size. There is  mild left ventricular hypertrophy. Left ventricular diastolic parameters are consistent with Grade II diastolic dysfunction (pseudonormalization). Right Ventricle: The right ventricular size is normal. No increase in right ventricular wall thickness. Right ventricular systolic function is normal. There is normal pulmonary artery systolic pressure. The tricuspid regurgitant velocity is 2.29 m/s, and  with an assumed right atrial pressure of 3 mmHg, the estimated right ventricular systolic pressure is 0000000 mmHg. Left Atrium: Left atrial size was mildly dilated. Right Atrium: Right atrial size was normal in size. Pericardium: There is no evidence of pericardial effusion. Mitral Valve: The mitral valve is normal in structure. Normal mobility of the mitral valve leaflets. Trivial mitral valve regurgitation. No evidence of mitral valve stenosis. Tricuspid Valve: The tricuspid valve is normal in structure. Tricuspid valve regurgitation is trivial. No evidence of tricuspid stenosis. Aortic Valve: The aortic  valve is normal in structure. Aortic valve regurgitation is not visualized. No aortic stenosis is present. Pulmonic Valve: The pulmonic valve was normal in structure. Pulmonic valve regurgitation is trivial. No evidence of pulmonic stenosis. Aorta: The aortic root is normal in size and structure. Venous: The inferior vena cava is normal in size with greater than 50% respiratory variability, suggesting right atrial pressure of 3 mmHg. IAS/Shunts: No atrial level shunt detected by color flow Doppler.  LEFT VENTRICLE PLAX 2D LVIDd:         5.80 cm  Diastology LVIDs:         4.30 cm  LV e' lateral:   8.27 cm/s LV PW:         1.20 cm  LV E/e' lateral: 13.1 LV IVS:        1.10 cm  LV e' medial:    6.53 cm/s LVOT diam:     2.10 cm  LV E/e' medial:  16.5 LV SV:         94 LV SV Index:   48 LVOT Area:     3.46 cm  RIGHT VENTRICLE             IVC RV S prime:     12.70 cm/s  IVC diam: 1.90 cm TAPSE (M-mode): 2.5 cm LEFT ATRIUM             Index       RIGHT ATRIUM           Index LA diam:        4.10 cm 2.11 cm/m  RA Area:     16.80 cm LA Vol (A2C):   78.5 ml 40.34 ml/m RA Volume:   42.70 ml  21.94 ml/m LA  Vol (A4C):   76.0 ml 39.06 ml/m LA Biplane Vol: 83.3 ml 42.81 ml/m  AORTIC VALVE LVOT Vmax:   133.00 cm/s LVOT Vmean:  94.500 cm/s LVOT VTI:    0.272 m  AORTA Ao Root diam: 3.30 cm MITRAL VALVE                TRICUSPID VALVE MV Area (PHT): 3.65 cm     TR Peak grad:   21.0 mmHg MV Decel Time: 208 msec     TR Vmax:        229.00 cm/s MV E velocity: 108.00 cm/s MV A velocity: 91.80 cm/s   SHUNTS MV E/A ratio:  1.18         Systemic VTI:  0.27 m                             Systemic Diam: 2.10 cm Candee Furbish MD Electronically signed by Candee Furbish MD Signature Date/Time: 09/27/2019/11:11:12 AM    Final    IR PERCUTANEOUS ART THROMBECTOMY/INFUSION INTRACRANIAL INC DIAG ANGIO  Result Date: 09/26/2019 INDICATION: 69 year old male presenting with acute right hemispheric ischemic stroke, right M2 occlusion, presenting for  cerebral angiogram and attempt for thrombectomy EXAM: ULTRASOUND GUIDED ACCESS RIGHT COMMON FEMORAL ARTERY CERVICAL AND CEREBRAL ANGIOGRAM MECHANICAL THROMBECTOMY OF RIGHT MCA TERRITORY FLAT PANEL CT DEPLOYMENT OF ANGIO-SEAL COMPARISON:  CT IMAGING OF THE SAME DAY MEDICATIONS: 2 g Ancef. The antibiotic was administered within 1 hour of the procedure ANESTHESIA/SEDATION: General endotracheal tube anesthesia CONTRAST:  60 cc Omnipaque 300 FLUOROSCOPY TIME:  Fluoroscopy Time: 8 minutes 18 seconds (1675 mGy). COMPLICATIONS: None TECHNIQUE: Informed written consent was obtained from the patient's family after a thorough discussion of the procedural risks, benefits and alternatives. Specific risks discussed include: Bleeding, infection, contrast reaction, kidney injury/failure, need for further procedure/surgery, arterial injury or dissection, embolization to new territory, intracranial hemorrhage (10-15% risk), neurologic deterioration, cardiopulmonary collapse, death. All questions were addressed. Maximal Sterile Barrier Technique was utilized including during the procedure including caps, mask, sterile gowns, sterile gloves, sterile drape, hand hygiene and skin antiseptic. A timeout was performed prior to the initiation of the procedure. FINDINGS: Initial Findings: Right common carotid artery:  Normal course caliber and contour. Right external carotid artery: Patent with antegrade flow. Right internal carotid artery: Normal course caliber and contour of the cervical portion. Vertical and petrous segment patent with normal course caliber contour. Cavernous segment patent. Clinoid segment patent. Antegrade flow of the ophthalmic artery. Ophthalmic segment patent. Terminus patent. Right MCA: M1 segment patent. Early temporal branch from the proximal MCA. Insular segments patent. Initial angiogram demonstrates angiographic cut off of superior division into the parietal territory, within the insular/operculum transition.  TICI 0 flow beyond the occlusion. Right ACA: A 1 segment patent. A 2 segment perfuses the right territory. Patent anterior communicating artery. Completion Findings: Right MCA: After single pass thrombectomy, there is complete restoration of flow through the occluded segment to the superior segment. TICI 3 flow restored Flat panel CT: No contrast staining or hemorrhage identified on the postcontrast CT. Redemonstration of remote infarcts of bilateral hemispheres, left PCA/inferior division territory, and on the right the likely remote infarction of the perirolandic parietal region. PROCEDURE: The anesthesia team was present to provide general endotracheal tube anesthesia and for patient monitoring during the procedure. Intubation was performed in negative pressure [place] in neuro IR holding. Interventional neuro radiology nursing staff was also present. Ultrasound survey of the right inguinal  region was performed with images stored and sent to PACs. 11 blade scalpel was used to make a small incision. Blunt dissection was performed with US guidance. A micropuncture needle was used access the right common femoral artery under ultrasound. With excellent arterial blood flow returned, an .018 micro wire was passed through the needle, observed to enter the abdominal aorta under fluoroscopy. The needle was removed, and a micropuncture sheath was placed over the wire. The inner dilator and wire were removed, and an 035 wire was advanced under fluoroscopy into the abdominal aorta. The sheath was removed and a 25cm 67F straight vascular sheath was placed. The dilator was removed and the sheath was flushed. Sheath was attached to pressurized and heparinized saline bag for constant forward flow. A coaxial system was then advanced over the 035 wire. This included a 95cm 087 "Walrus" balloon guide with coaxial 125cm Berenstein diagnostic catheter. This was advanced to the proximal descending thoracic aorta. Wire was then removed.  Double flush of the catheter was performed. Catheter was then used to select the innominate artery. Angiogram was performed. Using roadmap technique, the catheter was advanced over a standard glide wire into right cervical ICA, with distal position achieved of the balloon guide. The diagnostic catheter and the wire were removed. Formal angiogram was performed. Road map function was used once the occluded vessel was identified. Copious back flush was performed and the balloon catheter was attached to heparinized and pressurized saline bag for forward flow. A second coaxial system was then advanced through the balloon catheter, which included the selected intermediate catheter, microcatheter, and microwire. In this scenario, the set up included a 115cm CAT-5 intermediate catheter, a Trevo Provue18 microcatheter, and 014 synchro soft wire. This system was advanced through the balloon guide catheter under the road-map function, with adequate back-flush at the rotating hemostatic valve at that back end of the balloon guide. Once the wire and microcatheter were into the laceral segment, we attach heparinized saline pressured flush to the guide. Microcatheter and the intermediate catheter system were advanced through the terminal ICA and MCA to the level of the occlusion. The micro wire was then carefully advanced through the occluded segment. Microcatheter was then manipulated through the occluded segment and the wire was removed with saline drip at the hub. Blood was then aspirated through the hub of the microcatheter, and a gentle contrast injection was performed confirming intraluminal position. A rotating hemostatic valve was then attached to the back end of the microcatheter, and a pressurized and heparinized saline bag was attached to the catheter. 3 x 20 solitaire device was then selected. Back flush was achieved at the rotating hemostatic valve, and then the device was gently advanced through the microcatheter to  the distal end. The retriever was then unsheathed by withdrawing the microcatheter under fluoroscopy. Once the retriever was completely unsheathed, control angiogram was performed from the balloon catheter. A 4 minute time interval was observed. The balloon at the balloon guide catheter was then inflated under fluoroscopy for proximal flow arrest. Constant aspiration was then performed at the CAT-5 intermediate catheter, as the retriever was gently and slowly withdrawn with fluoroscopic observation. Once the retriever was entirely within the tip of the intermediate catheter, both were removed from the system. Free aspiration was confirmed at the hub of the balloon guide catheter, with free blood return confirmed. The balloon was then deflated, and a control angiogram was performed. Restoration of flow was confirmed. The skin at the puncture site was then cleaned  with Hibiclens. The 8 French sheath was removed and an 49F angioseal was deployed. Flat panel CT was performed. Patient tolerated the procedure well and remained hemodynamically stable throughout. No complications were encountered and no significant blood loss encountered. IMPRESSION: Status post ultrasound guided access right common femoral artery for cervical and cerebral angiogram and mechanical thrombectomy of right MCA/M2 occlusion, with single pass, restoration of TICI-3 flow and deployment of Angio-Seal. Signed, Dulcy Fanny. Dellia Nims, RPVI [facility] Radiology PLAN: The patient will remain intubated, as he remains COVID status unknown ICU status Target systolic blood pressure of 120-140 Right hip straight time 6 hours Frequent neurovascular checks Repeat neurologic imaging with CT and/MRI at the discretion of neurology team Electronically Signed   By: Corrie Mckusick D.O.   On: 09/26/2019 04:31   CT HEAD CODE STROKE WO CONTRAST  Result Date: 09/26/2019 CLINICAL DATA:  Code stroke.  Right-sided gaze EXAM:  CT HEAD WITHOUT CONTRAST TECHNIQUE: Contiguous axial images were obtained from the base of the skull through the vertex without intravenous contrast. COMPARISON:  11/17/2015 FINDINGS: Brain: There is no mass, hemorrhage or extra-axial collection. There is generalized atrophy without lobar predilection. There is hypoattenuation of the periventricular white matter, most commonly indicating chronic ischemic microangiopathy. There are multiple old infarcts, the largest of which is in the left PCA territory. Vascular: No abnormal hyperdensity of the major intracranial arteries or dural venous sinuses. No intracranial atherosclerosis. Skull: The visualized skull base, calvarium and extracranial soft tissues are normal. Sinuses/Orbits: No fluid levels or advanced mucosal thickening of the visualized paranasal sinuses. No mastoid or middle ear effusion. The orbits are normal. ASPECTS [facility] Stroke Program Early CT Score) - Ganglionic level infarction (caudate, lentiform nuclei, internal capsule, insula, M1-M3 cortex): 7 - Supraganglionic infarction (M4-M6 cortex): 3 Total score (0-10 with 10 being normal): 10 IMPRESSION: 1. No acute intracranial abnormality. 2. ASPECTS is 10. 3. Multiple old infarcts and chronic ischemic microangiopathy. These results were communicated to Dr. Roland Rack at 1:11 am on 09/26/2019 by text page via the [facility] messaging system. * Electronically Signed   By: Ulyses Jarred M.D.   On: 09/26/2019 01:12      HISTORY OF PRESENT ILLNESS SEYON CORDONE is a 69 y.o. male with a history of cardiomyopathy, stroke, atrial fibrillation not on [facility] who presents with left-sided deficits that started around 3 PM on 09/25/2019 (LKW).  He states that he noticed that his left hand was simply not working as well.  He thinks that he is having a stroke.  Of note, he has been noncompliant with his medications including his Eliquis for about 3 months. On arrival to the emergency department, a code stroke was  activated after LVO signs were noted.  He was taken for emergent CT which was negative, CTA revealed a distal M2 occlusion, and CT perfusion revealed about 28 cc of penumbra. Dr. Leonel Ramsay discussed the risks and benefits both with the patient as well as with his sister, and they both agreed with proceeding with thrombectomy. He did not receive tPA as he was out of the window. Modified Rankin Scale: 1. NIHSS: 8.  HOSPITAL COURSE Mr. IZHAR TESFAYE is a 69 y.o. male with history of cardiomyopathy, prostate cancer, malnutrition, tobacco use, ETOH abuse, marijuana use, stroke, s/p loop implant, atrial fibrillation (noncompliant with his medications including his Eliquis for about 3 months) who presents with left-sided deficits. He did not receive IV t-PA due to late presentation. CTA showed a  distal R M2 occlusion. He was taken for mechanical thrombectomy.   Stroke: Rt MCA territory stroke due to right M2 occlusion s/p IR with TICI3 revascularization - infarct embolic due to PAF not on [facility]  Code Stroke CT Head - No acute intracranial abnormality. ASPECTS is 10. Multiple old infarcts and chronic ischemic microangiopathy.   CTA H&N - Occlusion of the right MCA distal M2 segment, corresponding to the region of ischemia in the posterior right MCA territory. Chronic left M2 occlusion.   CT Perfusion - 36 mL area at risk ischemic brain according to perfusion analysis.  IR - right MCA/M2 occlusion with TICI-3 flow  MRI / MRA head - small R frontal lobe white matter, R posterior insula, R posterior parietal lobe tiny infarcts. Old R frontoparietal infarct. Chronic hemorrhagic L PCA infarct. R M2 patent (previous occlusion w/ residual irregularity)  2D Echo - EF 40-45%. No source of embolus   [employer] Virus 2 - negative  LDL - 127  HgbA1c 5.5  UDS + for THC  No antithrombotics prior to admission, treated w/  aspirin 300 mg suppository daily -> resumed eliquis as MRI with no large infarct or  hemorrhagic conversion.   Therapy recommendations:  HH PT, OT, SLP and RN   Disposition:  return home  PAF  Loop recorder detected paroxysmal A. Fib  Follow with cardiology Dr. Lovena Le  On Eliquis, however patient noncompliant  Rate controlled  Resumed Eliquis as MRI with no large infarct or hemorrhagic conversion.  History of stroke  10/2015 admitted for left acute PCA infarcts.  MRI also showed chronic right parietal small infarcts.  MRA showed left PCA severe stenosis, right PCA moderate stenosis.  CTA neck negative.  EF 35 to 40%.  Loop recorder placed.  LDL 136 and A1c 4.9.  Patient discharged on aspirin and Lipitor  Later loop recorder showed paroxysmal A. fib, patient put on Eliquis  However patient noncompliant with medication and also lost follow-up.  Hypertension  Home BP meds: Zestril, Norvasc, Spironolactone, metoprolol  Current BP meds: zestril, spironolactone, metoprolol     BP goal 120-140 post IR x 24h  Treated with cleviprex, now off  BP goal < 180 now, stable  Long-term BP goal normotensive  Hyperlipidemia  Home Lipid lowering medication: None  LDL 127, goal < 70  Current lipid lowering medication: Lipitor 40  Continue statin at discharge  Tobacco abuse  Current smoker  Smoking cessation counseling provided  Pt is willing to quit  Iron deficiency anemia and Acute blood loss anemia  Hb -12.6->11.2->10.2  Iron 22, ferritin 9  On iron pills  Other Stroke Risk Factors  Advanced age  Hx of ETOH abuse  Substance Abuse - marijuana - cessation education provided  Cardiomyopathy   Other Active Problems  Bradycardia - 50s-60s   DISCHARGE EXAM Blood pressure (!) 140/94, pulse (!) 54, temperature (!) 97.5 F (36.4 C), temperature source Oral, resp. rate 20, height 5\' 11"  (1.803 m), weight 75.1 kg, SpO2 100 %. General - Well nourished, well developed, in no apparent distress.  Ophthalmologic - fundi not visualized due  to noncooperation.  Cardiovascular - Regular rhythm and rate, not in afib.  Neuro - awake alert, no aphasia, normal fluent speech but mild dysarthria. Following commands well. Chronic right hemianopia. No gaze deviation. PERRL. EOMI. No facial asymmetry. Tongue midline. BUEs strength 5/5 equally. LLE 5-/5 and RLE 5/5. LUE decreased light touch sensation comparing with right. DTR 1+ and no babinski. Intact FTN bilaterally. Gait  not tested.  Discharge Diet   Heart healthy thin liquids  DISCHARGE PLAN  Disposition:  Return home  Home health PT, OT, SLP, RN for meds and education  Eliquis (apixaban) daily for secondary stroke prevention  Ongoing stroke risk factor control by Primary Care Physician at time of discharge  Follow-up PCP Azzie Glatter, FNP in 2 weeks.  Follow-up in [facility] in 4 weeks, office to schedule an appointment.   40 minutes were spent preparing discharge.  Rosalin Hawking, MD PhD Stroke Neurology 09/28/2019 4:04 PM

## 2019-09-28 NOTE — Evaluation (Signed)
Speech Language Pathology Evaluation Patient Details Name: [name] MRN: [id number] DOB: 09/24/1950 Today's Date: 09/28/2019 Time: [id number] SLP Time Calculation (min) (ACUTE ONLY): 24 min  Problem List:  Patient Active Problem List   Diagnosis Date Noted  . Acute ischemic right MCA stroke ([place]) 09/26/2019  . Malignant neoplasm of prostate ([place]) 01/19/2019  . Decreased appetite 10/15/2018  . Weight loss, unintentional 10/15/2018  . Generalized joint pain 10/15/2018  . Age-related cataract of both eyes 09/17/2018  . Hyperlipidemia 08/05/2018  . Elevated PSA 08/05/2018  . Vision changes 08/05/2018  . Encounter for vitamin deficiency screening 08/05/2018  . Paroxysmal atrial fibrillation ([place]) 11/14/2017  . Abnormal nuclear stress test 01/17/2016  . Candida esophagitis ([place])   . Pre-operative cardiovascular examination, LVEF < 35% 11/20/2015  . Cryptogenic stroke ([place]) 11/17/2015  . Essential hypertension 11/17/2015  . GERD (gastroesophageal reflux disease) 11/17/2015  . Loss of weight 11/17/2015  . Tobacco abuse 11/17/2015  . Dysphagia 11/17/2015  . Protein-calorie malnutrition, severe 11/17/2015  . Cerebral infarction due to thrombosis of right posterior cerebral artery ([place])   . History of stroke   . Cardiomyopathy, ischemic   . Esophageal stricture   . Dysphasia 05/07/2011   Past Medical History:  Past Medical History:  Diagnosis Date  . Alcohol abuse   . Candida esophagitis ([place])   . Cardiomyopathy ([place])   . CVA (cerebral infarction)   . Duodenal ulcer   . Dysphagia   . Headache(784.0)    history of  . Hypertension   . Prostate cancer ([place]) 12/2018  . Severe protein-calorie malnutrition ([place])   . Stroke [facility])    Past Surgical History:  Past Surgical History:  Procedure Laterality Date  . BALLOON DILATION N/A 11/22/2015   Procedure: BALLOON DILATION;  Surgeon: Mauri Pole, MD;  Location: [place] [facility];  Service: Endoscopy;  Laterality: N/A;  .  CARDIAC CATHETERIZATION N/A 01/17/2016   Procedure: Left Heart Cath and Coronary Angiography;  Surgeon: Sherren Mocha, MD;  Location: [place] CV LAB;  Service: Cardiovascular;  Laterality: N/A;  . DIRECT LARYNGOSCOPY  05/07/2011   Procedure: DIRECT LARYNGOSCOPY;  Surgeon: Rozetta Nunnery, MD;  Location: [place];  Service: ENT;  Laterality: N/A;  ESOPHAGOSCOPY  WITH DILATION  . EP IMPLANTABLE DEVICE N/A 11/17/2015   Procedure: Loop Recorder Insertion;  Surgeon: Evans Lance, MD;  Location: [place] CV LAB;  Service: Cardiovascular;  Laterality: N/A;  . ESOPHAGOGASTRODUODENOSCOPY  04/29/2011   Procedure: ESOPHAGOGASTRODUODENOSCOPY (EGD);  Surgeon: Winfield Cunas., MD;  Location: [facility];  Service: Endoscopy;  Laterality: N/A;  . ESOPHAGOGASTRODUODENOSCOPY N/A 11/22/2015   Procedure: ESOPHAGOGASTRODUODENOSCOPY (EGD);  Surgeon: Mauri Pole, MD;  Location[facility]COPY;  Service: Endoscopy;  Laterality: N/A;  . IR CT HEAD LTD  09/26/2019  . IR PERCUTANEOUS ART THROMBECTOMY/INFUSION INTRACRANIAL INC DIAG ANGIO  09/26/2019  . IR US GUIDE VASC ACCESS RIGHT  09/26/2019  . LOOP RECORDER IMPLANT    . RADIOLOGY WITH ANESTHESIA N/A 09/26/2019   Procedure: IR WITH ANESTHESIA;  Surgeon: Luanne Bras, MD;  Location: [place];  Service: Radiology;  Laterality: N/A;   HPI:  Gregory Perry is a 69 y.o. male with a history of cardiomyopathy, stroke, atrial fibrillation who presents with left-sided deficits that started around 3 PM. Pt found to have occluded R MCA M2 and underwent emergent thrombectomy va cerebral arteriogram. PMH: ETOH abuse, prostate cancer, stroke.   Assessment / Plan / Recommendation Clinical Impression   Pt scored a 13 out  of 30 on the SLUMS, indicative of cognitive impairment. Baseline level of function is not clear, but he does self-report that this is a decline from his typical level of function. He has trouble with delayed recall and selective attention to details.  Min-Mod cues were provided for anticipatory awareness. Pt would benefit from [facility] SLP f/u and supervision upon initial return home. He believes he has family support to accommodate this.    SLP Assessment  SLP Recommendation/Assessment: Patient needs continued Speech Lanaguage Pathology Services SLP Visit Diagnosis: Cognitive communication deficit (R41.841)    Follow Up Recommendations  Home health SLP;24 hour supervision/assistance(supervision upon initial return home)    Frequency and Duration min 2x/week  2 weeks      SLP Evaluation Cognition  Overall Cognitive Status: Impaired/Different from baseline Arousal/Alertness: Awake/alert Orientation Level: Oriented X4 Attention: Sustained;Selective Sustained Attention: Appears intact Selective Attention: Impaired Selective Attention Impairment: Verbal complex Memory: Impaired Memory Impairment: Decreased recall of new information Awareness: Appears intact Problem Solving: Impaired Problem Solving Impairment: Verbal complex Safety/Judgment: Appears intact       Comprehension  Auditory Comprehension Overall Auditory Comprehension: Appears within functional limits for tasks assessed    Expression Expression Primary Mode of Expression: Verbal Verbal Expression Overall Verbal Expression: Appears within functional limits for tasks assessed   Oral / Motor  Motor Speech Overall Motor Speech: Appears within functional limits for tasks assessed   GO                     [name]., M.A. [facility] Pager [phone] Office [phone]  09/28/2019, 12:48 PM

## 2019-09-28 NOTE — Progress Notes (Signed)
Patient was given AVS papers and information related was taught. The IV was removed as well as the electrodes on the patient. Patient was sent with all belongings and in good spirits.

## 2019-09-29 NOTE — Anesthesia Postprocedure Evaluation (Signed)
Anesthesia Post Note  Patient: Gregory Perry  Procedure(s) Performed: IR WITH ANESTHESIA (N/A )     Patient location during evaluation: ICU Anesthesia Type: General Level of consciousness: patient remains intubated per anesthesia plan Pain management: pain level controlled Vital Signs Assessment: post-procedure vital signs reviewed and stable Respiratory status: patient remains intubated per anesthesia plan Cardiovascular status: stable Postop Assessment: no apparent nausea or vomiting Anesthetic complications: no    Last Vitals:  Vitals:   09/28/19 0746 09/28/19 1132  BP: 129/82 (!) 140/94  Pulse: (!) 57 (!) 54  Resp: 18 20  Temp: 36.7 C (!) 36.4 C  SpO2: 100% 100%    Last Pain:  Vitals:   09/28/19 1132  TempSrc: Oral  PainSc:                  Cheyla Duchemin

## 2019-10-08 ENCOUNTER — Encounter: Payer: Self-pay | Admitting: Medical Oncology

## 2019-10-08 ENCOUNTER — Encounter: Payer: Self-pay | Admitting: Family Medicine

## 2019-10-08 ENCOUNTER — Other Ambulatory Visit: Payer: Self-pay

## 2019-10-08 ENCOUNTER — Ambulatory Visit (INDEPENDENT_AMBULATORY_CARE_PROVIDER_SITE_OTHER): Payer: Medicare Other | Admitting: Family Medicine

## 2019-10-08 VITALS — BP 147/84 | HR 64 | Temp 97.7°F | Ht 71.0 in | Wt 168.4 lb

## 2019-10-08 DIAGNOSIS — Z09 Encounter for follow-up examination after completed treatment for conditions other than malignant neoplasm: Secondary | ICD-10-CM

## 2019-10-08 DIAGNOSIS — E538 Deficiency of other specified B group vitamins: Secondary | ICD-10-CM | POA: Diagnosis not present

## 2019-10-08 DIAGNOSIS — C61 Malignant neoplasm of prostate: Secondary | ICD-10-CM

## 2019-10-08 DIAGNOSIS — R630 Anorexia: Secondary | ICD-10-CM | POA: Diagnosis not present

## 2019-10-08 DIAGNOSIS — H539 Unspecified visual disturbance: Secondary | ICD-10-CM | POA: Diagnosis not present

## 2019-10-08 DIAGNOSIS — I480 Paroxysmal atrial fibrillation: Secondary | ICD-10-CM

## 2019-10-08 DIAGNOSIS — R634 Abnormal weight loss: Secondary | ICD-10-CM

## 2019-10-08 DIAGNOSIS — M2550 Pain in unspecified joint: Secondary | ICD-10-CM

## 2019-10-08 DIAGNOSIS — I10 Essential (primary) hypertension: Secondary | ICD-10-CM

## 2019-10-08 DIAGNOSIS — H259 Unspecified age-related cataract: Secondary | ICD-10-CM | POA: Diagnosis not present

## 2019-10-08 DIAGNOSIS — E559 Vitamin D deficiency, unspecified: Secondary | ICD-10-CM | POA: Diagnosis not present

## 2019-10-08 DIAGNOSIS — R972 Elevated prostate specific antigen [PSA]: Secondary | ICD-10-CM

## 2019-10-08 LAB — POCT URINALYSIS DIPSTICK
Bilirubin, UA: NEGATIVE
Blood, UA: NEGATIVE
Glucose, UA: NEGATIVE
Ketones, UA: NEGATIVE
Leukocytes, UA: NEGATIVE
Nitrite, UA: NEGATIVE
Protein, UA: NEGATIVE
Spec Grav, UA: 1.02 (ref 1.010–1.025)
Urobilinogen, UA: 0.2 E.U./dL
pH, UA: 7 (ref 5.0–8.0)

## 2019-10-08 MED ORDER — METOPROLOL TARTRATE 25 MG PO TABS: 25.0000 mg | ORAL_TABLET | Freq: Two times a day (BID) | ORAL | 6 refills | Status: DC

## 2019-10-08 MED ORDER — FERROUS SULFATE 325 (65 FE) MG PO TABS: 325.0000 mg | ORAL_TABLET | Freq: Three times a day (TID) | ORAL | 6 refills | Status: DC

## 2019-10-08 MED ORDER — ENSURE ENLIVE PO LIQD: 237.0000 mL | Freq: Two times a day (BID) | ORAL | 12 refills | Status: DC

## 2019-10-08 MED ORDER — APIXABAN 5 MG PO TABS: 5.0000 mg | ORAL_TABLET | Freq: Two times a day (BID) | ORAL | 6 refills | Status: DC

## 2019-10-08 MED ORDER — SPIRONOLACTONE 25 MG PO TABS: 25.0000 mg | ORAL_TABLET | Freq: Every day | ORAL | 6 refills | Status: DC

## 2019-10-08 MED ORDER — DOCUSATE SODIUM 100 MG PO CAPS: 100.0000 mg | ORAL_CAPSULE | Freq: Two times a day (BID) | ORAL | 6 refills | Status: DC | PRN

## 2019-10-08 MED ORDER — POLYETHYLENE GLYCOL 3350 17 G PO PACK: 17.0000 g | PACK | Freq: Every day | ORAL | 6 refills | Status: DC | PRN

## 2019-10-08 MED ORDER — ATORVASTATIN CALCIUM 40 MG PO TABS: 40.0000 mg | ORAL_TABLET | Freq: Every day | ORAL | 6 refills | Status: DC

## 2019-10-08 NOTE — Progress Notes (Signed)
Patient [facility] Follow Up  Subjective:  Patient ID: Gregory Perry, male    DOB: [birth year]  Age: 69 y.o. MRN: [id number]  CC:  Chief Complaint  Patient presents with  . Hospitalization Follow-up    Stroke 09/25/2019    HPI Gregory Perry is a 69 year old male who presents for Hospital Follow Up today.   Past Medical History:  Diagnosis Date  . Alcohol abuse   . Candida esophagitis ([place])   . Cardiomyopathy ([place])   . CVA (cerebral infarction)   . Duodenal ulcer   . Dysphagia   . Headache(784.0)    history of  . Hypertension   . Prostate cancer ([place]) 12/2018  . Severe protein-calorie malnutrition ([place])   . Stroke [facility]) 09/25/2019   Current Status: Since his last office visit, he has been lost to follow up. He recently had a Hospital Admission on 51/2021-09/28/2019 for Stroke. He denies extremity weakness. Today, he is doing well with no complaints. He denies visual changes, chest pain, cough, shortness of breath, heart palpitations, and falls. He has occasional headaches and dizziness with position changes. Denies severe headaches, confusion, seizures, double vision, and blurred vision, nausea and vomiting. He denies fevers, chills, fatigue, recent infections, weight loss, and night sweats. Denies GI problems such as diarrhea, and constipation. He has no reports of blood in stools, dysuria and hematuria. No depression or anxiety reported today. He denies suicidal ideations, homicidal ideations, or auditory hallucinations. He is taking all medications as prescribed. He denies pain today.    Past Surgical History:  Procedure Laterality Date  . BALLOON DILATION N/A 11/22/2015   Procedure: BALLOON DILATION;  Surgeon: Mauri Pole, MD;  Location: [facility] ENDOSCOPY;  Service: Endoscopy;  Laterality: N/A;  . CARDIAC CATHETERIZATION N/A 01/17/2016   Procedure: Left Heart Cath and Coronary Angiography;  Surgeon: Sherren Mocha,  MD;  Location: [place] CV LAB;  Service: Cardiovascular;  Laterality: N/A;  . DIRECT LARYNGOSCOPY  05/07/2011   Procedure: DIRECT LARYNGOSCOPY;  Surgeon: Rozetta Nunnery, MD;  Location: [facility];  Service: ENT;  Laterality: N/A;  ESOPHAGOSCOPY  WITH DILATION  . EP IMPLANTABLE DEVICE N/A 11/17/2015   Procedure: Loop Recorder Insertion;  Surgeon: Evans Lance, MD;  Location: [place] CV LAB;  Service: Cardiovascular;  Laterality: N/A;  . ESOPHAGOGASTRODUODENOSCOPY  04/29/2011   Procedure: ESOPHAGOGASTRODUODENOSCOPY (EGD);  Surgeon: Winfield Cunas., MD;  Location: [facility];  Service: Endoscopy;  Laterality: N/A;  . ESOPHAGOGASTRODUODENOSCOPY N/A 11/22/2015   Procedure: ESOPHAGOGASTRODUODENOSCOPY (EGD);  Surgeon: Mauri Pole, MD;  Location[facility];  Service: Endoscopy;  Laterality: N/A;  . IR CT HEAD LTD  09/26/2019  . IR PERCUTANEOUS ART THROMBECTOMY/INFUSION INTRACRANIAL INC DIAG ANGIO  09/26/2019  . IR US GUIDE VASC ACCESS RIGHT  09/26/2019  . LOOP RECORDER IMPLANT    . RADIOLOGY WITH ANESTHESIA N/A 09/26/2019   Procedure: IR WITH ANESTHESIA;  Surgeon: Luanne Bras, MD;  Location: [facility];  Service: Radiology;  Laterality: N/A;    Family History  Problem Relation Age of Onset  . Heart attack Maternal Grandfather   . Colon cancer Neg Hx   . Breast cancer Neg Hx   . Prostate cancer Neg Hx     Social History   Socioeconomic History  . Marital status: Legally Separated    Spouse name: Not on file  . Number of children: 1  . Years of education: Not on file  .  Highest education level: Not on file  Occupational History    Comment: retired  Tobacco Use  . Smoking status: Current Every Day Smoker    Packs/day: 0.50    Years: 30.00    Pack years: 15.00    Types: Cigarettes  . Smokeless tobacco: Never Used  . Tobacco comment: Pt smokes 6 to 7 a day trying to quit  Substance and Sexual Activity  . Alcohol use: No    Alcohol/week: 3.0 standard drinks    Types:  3 Shots of liquor per week    Comment: quit in 10/2015   . Drug use: Yes    Types: Marijuana    Comment: occ  . Sexual activity: Not Currently  Other Topics Concern  . Not on file  Social History Narrative  . Not on file   Social Determinants of Health   Financial Resource Strain:   . Difficulty of Paying Living Expenses:   Food Insecurity:   . Worried About Charity fundraiser in the Last Year:   . Arboriculturist in the Last Year:   Transportation Needs:   . Film/video editor (Medical):   Marland Kitchen Lack of Transportation (Non-Medical):   Physical Activity:   . Days of Exercise per Week:   . Minutes of Exercise per Session:   Stress:   . Feeling of Stress :   Social Connections:   . Frequency of Communication with Friends and Family:   . Frequency of Social Gatherings with Friends and Family:   . Attends Religious Services:   . Active Member of Clubs or Organizations:   . Attends Archivist Meetings:   Marland Kitchen Marital Status:   Intimate Partner Violence:   . Fear of Current or Ex-Partner:   . Emotionally Abused:   Marland Kitchen Physically Abused:   . Sexually Abused:     Outpatient Medications Prior to Visit  Medication Sig Dispense Refill  . pantoprazole (PROTONIX) 40 MG tablet Take 1 tablet (40 mg total) by mouth daily. 30 tablet 2  . apixaban (ELIQUIS) 5 MG TABS tablet Take 1 tablet (5 mg total) by mouth 2 (two) times daily. 60 tablet 2  . atorvastatin (LIPITOR) 40 MG tablet Take 1 tablet (40 mg total) by mouth daily. 30 tablet 2  . docusate sodium (COLACE) 100 MG capsule Take 1 capsule (100 mg total) by mouth 2 (two) times daily as needed for mild constipation. 10 capsule 0  . feeding supplement, ENSURE ENLIVE, (ENSURE ENLIVE) LIQD Take 237 mLs by mouth 2 (two) times daily between meals. 237 mL 12  . ferrous sulfate 325 (65 FE) MG tablet Take 1 tablet (325 mg total) by mouth 3 (three) times daily with meals. 90 tablet 3  . lisinopril (ZESTRIL) 40 MG tablet Take 1 tablet (40  mg total) by mouth daily. 30 tablet 2  . metoprolol tartrate (LOPRESSOR) 25 MG tablet Take 1 tablet (25 mg total) by mouth 2 (two) times daily. 60 tablet 2  . polyethylene glycol (MIRALAX / GLYCOLAX) 17 g packet Take 17 g by mouth daily as needed for moderate constipation. 14 each 0  . spironolactone (ALDACTONE) 25 MG tablet Take 1 tablet (25 mg total) by mouth daily. 30 tablet 2  . Blood Pressure Monitoring (BLOOD PRESSURE CUFF) MISC Check blood pressure daily between 8 pm -9 pm (Patient not taking: Reported on 09/26/2019) 1 each 0   No facility-administered medications prior to visit.    No Known Allergies  ROS Review of  Systems  Constitutional: Negative.   HENT: Negative.   Eyes: Negative.   Respiratory: Positive for shortness of breath (occasional ).   Cardiovascular: Negative.   Gastrointestinal: Negative.   Endocrine: Negative.   Musculoskeletal: Negative.   Skin: Negative.   Allergic/Immunologic: Negative.   Neurological: Positive for dizziness (occasional ) and headaches (occasional ).  Hematological: Negative.   Psychiatric/Behavioral: Negative.       Objective:    Physical Exam  Constitutional: He is oriented to person, place, and time. He appears well-developed and well-nourished.  HENT:  Head: Normocephalic and atraumatic.  Eyes: Conjunctivae are normal.  Cardiovascular: Normal rate, regular rhythm, normal heart sounds and intact distal pulses.  Abdominal: Soft. Bowel sounds are normal.  Musculoskeletal:        General: Normal range of motion.     Cervical back: Normal range of motion and neck supple.  Neurological: He is alert and oriented to person, place, and time. He has normal reflexes.  Skin: Skin is warm and dry.  Psychiatric: He has a normal mood and affect. His behavior is normal. Judgment and thought content normal.  Nursing note and vitals reviewed.   BP (!) 147/84   Pulse 64   Temp 97.7 F (36.5 C)   Ht 5\' 11"  (1.803 m)   Wt 168 lb 6.4 oz  (76.4 kg)   SpO2 100%   BMI 23.49 kg/m  Wt Readings from Last 3 Encounters:  10/08/19 168 lb 6.4 oz (76.4 kg)  09/28/19 165 lb 9.1 oz (75.1 kg)  03/15/19 155 lb 6.4 oz (70.5 kg)     Health Maintenance Due  Topic Date Due  . COVID-19 Vaccine (1) Never done  . COLONOSCOPY  Never done    There are no preventive care reminders to display for this patient.  Lab Results  Component Value Date   TSH 0.460 10/08/2019   Lab Results  Component Value Date   WBC 6.3 10/08/2019   HGB 10.7 (L) 10/08/2019   HCT 35.1 (L) 10/08/2019   MCV 78 (L) 10/08/2019   PLT 359 10/08/2019   Lab Results  Component Value Date   NA 138 09/28/2019   K 3.4 (L) 09/28/2019   CO2 25 09/28/2019   GLUCOSE 89 09/28/2019   BUN 12 09/28/2019   CREATININE 1.06 09/28/2019   BILITOT 0.6 08/03/2018   ALKPHOS 81 08/03/2018   AST 24 08/03/2018   ALT 10 08/03/2018   PROT 7.8 08/03/2018   ALBUMIN 4.2 08/03/2018   CALCIUM 8.9 09/28/2019   ANIONGAP 8 09/28/2019   Lab Results  Component Value Date   CHOL 139 10/08/2019   Lab Results  Component Value Date   HDL 57 10/08/2019   Lab Results  Component Value Date   LDLCALC 71 10/08/2019   Lab Results  Component Value Date   TRIG 49 10/08/2019   Lab Results  Component Value Date   CHOLHDL 2.4 10/08/2019   Lab Results  Component Value Date   HGBA1C 5.5 09/26/2019      Assessment & Plan:   1. Hospital discharge follow-up  2. Essential hypertension The current medical regimen is effective; blood pressure is stable at 147/84 today; continue present plan and medications as prescribed. He will continue to take medications as prescribed, to decrease high sodium intake, excessive alcohol intake, increase potassium intake, smoking cessation, and increase physical activity of at least 30 minutes of cardio activity daily. He  will continue to follow Heart Healthy or DASH diet. - POCT urinalysis  dipstick - CBC with Differential - Comprehensive metabolic  panel; Future - Lipid Panel - TSH  3. Elevated PSA, between 10 and less than 20 ng/ml Last PSA level at 10.5 on 10/08/2019.   4. Decreased appetite Increased.  5. Weight loss, unintentional He has had a 11 lb weight gain in 7 months.   6. Vision changes Stable.   7. Age-related cataract of both eyes, unspecified age-related cataract type He will follow up with Optometry asap.   8. Generalized joint pain  9. Malignant neoplasm of prostate [facility]) He is scheduled to follow up with Rad Onc on 10/14/2019.   10. Paroxysmal atrial fibrillation (HCC) Stable today.  - apixaban (ELIQUIS) 5 MG TABS tablet; Take 1 tablet (5 mg total) by mouth 2 (two) times daily.  Dispense: 60 tablet; Refill: 6  11. Elevated PSA  12. Prostate cancer (H[facility]) - PSA  13. Follow up He will follow up in 1 month.  Meds ordered this encounter  Medications  . apixaban (ELIQUIS) 5 MG TABS tablet    Sig: Take 1 tablet (5 mg total) by mouth 2 (two) times daily.    Dispense:  60 tablet    Refill:  6    Patient will call when he needs this filled.  Marland Kitchen atorvastatin (LIPITOR) 40 MG tablet    Sig: Take 1 tablet (40 mg total) by mouth daily.    Dispense:  30 tablet    Refill:  6  . docusate sodium (COLACE) 100 MG capsule    Sig: Take 1 capsule (100 mg total) by mouth 2 (two) times daily as needed for mild constipation.    Dispense:  30 capsule    Refill:  6  . feeding supplement, ENSURE ENLIVE, (ENSURE ENLIVE) LIQD    Sig: Take 237 mLs by mouth 2 (two) times daily between meals.    Dispense:  237 mL    Refill:  12  . ferrous sulfate 325 (65 FE) MG tablet    Sig: Take 1 tablet (325 mg total) by mouth 3 (three) times daily with meals.    Dispense:  90 tablet    Refill:  6  . metoprolol tartrate (LOPRESSOR) 25 MG tablet    Sig: Take 1 tablet (25 mg total) by mouth 2 (two) times daily.    Dispense:  60 tablet    Refill:  6  . polyethylene glycol (MIRALAX / GLYCOLAX) 17 g packet    Sig: Take 17 g by mouth  daily as needed for moderate constipation.    Dispense:  14 each    Refill:  6  . spironolactone (ALDACTONE) 25 MG tablet    Sig: Take 1 tablet (25 mg total) by mouth daily.    Dispense:  30 tablet    Refill:  6    Orders Placed This Encounter  Procedures  . CBC with Differential  . Comprehensive metabolic panel  . Lipid Panel  . TSH  . PSA  . Vitamin B12  . Vitamin D, 25-hydroxy  . POCT urinalysis dipstick    Referral Orders  No referral(s) requested today   Kathe Becton,  MSN, FNP-BC [place] [address] [place], [place] [address] [phone] fax   Problem List Items Addressed This Visit      Cardiovascular and Mediastinum   Essential hypertension - Primary   Relevant Medications   apixaban (ELIQUIS) 5 MG TABS tablet   atorvastatin (LIPITOR) 40 MG  tablet   metoprolol tartrate (LOPRESSOR) 25 MG tablet   spironolactone (ALDACTONE) 25 MG tablet   Other Relevant Orders   POCT urinalysis dipstick (Completed)   CBC with Differential (Completed)   Comprehensive metabolic panel   Lipid Panel (Completed)   TSH (Completed)   Paroxysmal atrial fibrillation (HCC)   Relevant Medications   apixaban (ELIQUIS) 5 MG TABS tablet   atorvastatin (LIPITOR) 40 MG tablet   metoprolol tartrate (LOPRESSOR) 25 MG tablet   spironolactone (ALDACTONE) 25 MG tablet     Genitourinary   Malignant neoplasm of prostate (HCC)     Other   Age-related cataract of both eyes   Decreased appetite   Elevated PSA   Generalized joint pain   Vision changes   Weight loss, unintentional    Other Visit Diagnoses    Hospital discharge follow-up       Elevated PSA, between 10 and less than 20 ng/ml       Follow up       Prostate cancer ([place])       Relevant Orders   PSA (Completed)   Vitamin D deficiency       Relevant Orders   Vitamin D, 25-hydroxy (Completed)   Vitamin B12 deficiency        Relevant Orders   Vitamin B12 (Completed)      Meds ordered this encounter  Medications  . apixaban (ELIQUIS) 5 MG TABS tablet    Sig: Take 1 tablet (5 mg total) by mouth 2 (two) times daily.    Dispense:  60 tablet    Refill:  6    Patient will call when he needs this filled.  Marland Kitchen atorvastatin (LIPITOR) 40 MG tablet    Sig: Take 1 tablet (40 mg total) by mouth daily.    Dispense:  30 tablet    Refill:  6  . docusate sodium (COLACE) 100 MG capsule    Sig: Take 1 capsule (100 mg total) by mouth 2 (two) times daily as needed for mild constipation.    Dispense:  30 capsule    Refill:  6  . feeding supplement, ENSURE ENLIVE, (ENSURE ENLIVE) LIQD    Sig: Take 237 mLs by mouth 2 (two) times daily between meals.    Dispense:  237 mL    Refill:  12  . ferrous sulfate 325 (65 FE) MG tablet    Sig: Take 1 tablet (325 mg total) by mouth 3 (three) times daily with meals.    Dispense:  90 tablet    Refill:  6  . metoprolol tartrate (LOPRESSOR) 25 MG tablet    Sig: Take 1 tablet (25 mg total) by mouth 2 (two) times daily.    Dispense:  60 tablet    Refill:  6  . polyethylene glycol (MIRALAX / GLYCOLAX) 17 g packet    Sig: Take 17 g by mouth daily as needed for moderate constipation.    Dispense:  14 each    Refill:  6  . spironolactone (ALDACTONE) 25 MG tablet    Sig: Take 1 tablet (25 mg total) by mouth daily.    Dispense:  30 tablet    Refill:  6    Follow-up: Return in about 1 month (around 11/08/2019).    Azzie Glatter, FNP

## 2019-10-09 LAB — CBC WITH DIFFERENTIAL/PLATELET
Basophils Absolute: 0 10*3/uL (ref 0.0–0.2)
Basos: 0 %
EOS (ABSOLUTE): 0.2 10*3/uL (ref 0.0–0.4)
Eos: 3 %
Hematocrit: 35.1 % — ABNORMAL LOW (ref 37.5–51.0)
Hemoglobin: 10.7 g/dL — ABNORMAL LOW (ref 13.0–17.7)
Immature Grans (Abs): 0 10*3/uL (ref 0.0–0.1)
Immature Granulocytes: 0 %
Lymphocytes Absolute: 2.6 10*3/uL (ref 0.7–3.1)
Lymphs: 41 %
MCH: 23.9 pg — ABNORMAL LOW (ref 26.6–33.0)
MCHC: 30.5 g/dL — ABNORMAL LOW (ref 31.5–35.7)
MCV: 78 fL — ABNORMAL LOW (ref 79–97)
Monocytes Absolute: 0.6 10*3/uL (ref 0.1–0.9)
Monocytes: 9 %
Neutrophils Absolute: 2.9 10*3/uL (ref 1.4–7.0)
Neutrophils: 47 %
Platelets: 359 10*3/uL (ref 150–450)
RBC: 4.48 x10E6/uL (ref 4.14–5.80)
RDW: 19.9 % — ABNORMAL HIGH (ref 11.6–15.4)
WBC: 6.3 10*3/uL (ref 3.4–10.8)

## 2019-10-09 LAB — VITAMIN B12: Vitamin B-12: 511 pg/mL (ref 232–1245)

## 2019-10-09 LAB — PSA: Prostate Specific Ag, Serum: 10.5 ng/mL — ABNORMAL HIGH (ref 0.0–4.0)

## 2019-10-09 LAB — LIPID PANEL
Chol/HDL Ratio: 2.4 ratio (ref 0.0–5.0)
Cholesterol, Total: 139 mg/dL (ref 100–199)
HDL: 57 mg/dL (ref >39)
LDL Chol Calc (NIH): 71 mg/dL (ref 0–99)
Triglycerides: 49 mg/dL (ref 0–149)
VLDL Cholesterol Cal: 11 mg/dL (ref 5–40)

## 2019-10-09 LAB — TSH: TSH: 0.46 u[IU]/mL (ref 0.450–4.500)

## 2019-10-09 LAB — VITAMIN D 25 HYDROXY (VIT D DEFICIENCY, FRACTURES): Vit D, 25-Hydroxy: 10.1 ng/mL — ABNORMAL LOW (ref 30.0–100.0)

## 2019-10-11 ENCOUNTER — Telehealth: Payer: Self-pay

## 2019-10-11 ENCOUNTER — Other Ambulatory Visit: Payer: Self-pay | Admitting: Family Medicine

## 2019-10-11 ENCOUNTER — Encounter: Payer: Self-pay | Admitting: Family Medicine

## 2019-10-11 DIAGNOSIS — E559 Vitamin D deficiency, unspecified: Secondary | ICD-10-CM

## 2019-10-11 MED ORDER — VITAMIN D (ERGOCALCIFEROL) 1.25 MG (50000 UNIT) PO CAPS: 50000.0000 [IU] | ORAL_CAPSULE | ORAL | 6 refills | Status: DC

## 2019-10-11 MED FILL — VIT D2 1.25 MG (50,000 UNIT: 1.25 MG | 35 days supply | Qty: 5 | Fill #0

## 2019-10-11 NOTE — Telephone Encounter (Signed)
-----   Message from Azzie Glatter, [place] sent at 10/11/2019 10:57 AM EDT ----- Vitamin D level is extremely low. Rx for Vitamin D supplement sent to pharmacy today. He should include foods that are high in Vitamin D. These include: Salmon, Cod Liver Oil, Mushrooms, Canned Fish, Milk, and Egg Yolks.   PSA level is stable today. Please remind patient contact our office for updated phone number to be reached by Radiation Oncology for his upcoming 'TeleVisit' on 10/14/2019. Thank you.     All other labs are stable. Keep follow up appointment with our office. Please inform patient.

## 2019-10-11 NOTE — Telephone Encounter (Signed)
Message left for call back 

## 2019-10-11 NOTE — Telephone Encounter (Signed)
Sister Estill Bamberg [phone] given results and CC phone number for follow up appointment.

## 2019-10-11 NOTE — Telephone Encounter (Signed)
-----   Message from Azzie Glatter, [place] sent at 10/11/2019 10:57 AM EDT ----- Vitamin D level is extremely low. Rx for Vitamin D supplement sent to pharmacy today. He should include foods that are high in Vitamin D. These include: Salmon, Cod Liver Oil, Mushrooms, Canned Fish, Milk, and Egg Yolks.   PSA level is stable today. Please remind patient contact our office for updated phone number to be reached by Radiation Oncology for his upcoming 'TeleVisit' on 10/14/2019. Thank you.     All other labs are stable. Keep follow up appointment with our office. Please inform patient.

## 2019-10-12 ENCOUNTER — Telehealth: Payer: Self-pay | Admitting: Family Medicine

## 2019-10-12 NOTE — Telephone Encounter (Signed)
[facility] called needing a prescription request for the pt. Did not specify what med. You can contact them at [phone]. Also, their fax number is [phone]

## 2019-10-13 ENCOUNTER — Encounter: Payer: Self-pay | Admitting: Urology

## 2019-10-13 ENCOUNTER — Telehealth: Payer: Self-pay

## 2019-10-13 ENCOUNTER — Other Ambulatory Visit: Payer: Self-pay

## 2019-10-13 NOTE — Telephone Encounter (Signed)
Called to speak with patient about follow up telephone encounter. Reached brother he stated patient does not have a cell phone we can contact sister Latricia Heft [phone] for appointments and any information pertaining to patient.

## 2019-10-14 ENCOUNTER — Telehealth (HOSPITAL_COMMUNITY): Payer: Self-pay | Admitting: Hematology

## 2019-10-14 ENCOUNTER — Other Ambulatory Visit: Payer: Self-pay

## 2019-10-14 ENCOUNTER — Ambulatory Visit
Admission: RE | Admit: 2019-10-14 | Discharge: 2019-10-14 | Disposition: A | Discharge status: Home / Self Care | Payer: Medicare Other | Source: Ambulatory Visit | Attending: Urology | Admitting: Urology

## 2019-10-14 ENCOUNTER — Telehealth: Payer: Self-pay

## 2019-10-14 VITALS — BP 160/102 | HR 72 | Temp 97.7°F | Wt 166.6 lb

## 2019-10-14 DIAGNOSIS — C61 Malignant neoplasm of prostate: Secondary | ICD-10-CM

## 2019-10-14 NOTE — Telephone Encounter (Signed)
ERROR

## 2019-10-14 NOTE — Telephone Encounter (Signed)
Permission to start physical therapy granted. PT name is  Amayra ph [phone].

## 2019-10-14 NOTE — Telephone Encounter (Signed)
Myra would like a verbal order to see patient starting this week for 2 week 1, and 1 week 2 / Please call Myra back and you can leave a voicemail if needed.

## 2019-10-14 NOTE — Progress Notes (Signed)
Patient in office to speak with provider about starting treatment for prostate. Denies dysuria hematuria no loose stools or blood in stool. No follow up appointment at this time for urologist.

## 2019-10-14 NOTE — Telephone Encounter (Signed)
PT has will be started for Mr. Gregory Perry

## 2019-10-18 ENCOUNTER — Telehealth: Payer: Self-pay | Admitting: Family Medicine

## 2019-10-18 NOTE — Telephone Encounter (Signed)
[facility] called to make sure we received refill request for lucinapril and pantoprazole

## 2019-10-21 ENCOUNTER — Other Ambulatory Visit: Payer: Self-pay

## 2019-10-21 ENCOUNTER — Ambulatory Visit
Admission: RE | Admit: 2019-10-21 | Discharge: 2019-10-21 | Disposition: A | Discharge status: Home / Self Care | Payer: Medicare Other | Source: Ambulatory Visit | Attending: Radiation Oncology | Admitting: Radiation Oncology

## 2019-10-21 ENCOUNTER — Encounter: Payer: Self-pay | Admitting: Medical Oncology

## 2019-10-21 ENCOUNTER — Ambulatory Visit
Admission: RE | Admit: 2019-10-21 | Discharge: 2019-10-21 | Disposition: A | Discharge status: Home / Self Care | Payer: Medicare Other | Source: Ambulatory Visit | Attending: Urology | Admitting: Urology

## 2019-10-21 ENCOUNTER — Encounter: Payer: Self-pay | Admitting: Radiation Oncology

## 2019-10-21 VITALS — BP 163/89 | HR 86 | Temp 97.6°F | Ht 71.0 in | Wt 162.8 lb

## 2019-10-21 DIAGNOSIS — C61 Malignant neoplasm of prostate: Secondary | ICD-10-CM | POA: Diagnosis not present

## 2019-10-21 DIAGNOSIS — R972 Elevated prostate specific antigen [PSA]: Secondary | ICD-10-CM | POA: Diagnosis not present

## 2019-10-21 NOTE — Progress Notes (Signed)
[place]         [phone] ________________________________  Outpatient Re-Consultation   Name: Gregory Perry  Date: 10/21/2019  DOB: [birth year]  [id number], Gregory Lunch, FNP  Festus Aloe, MD   REFERRING PHYSICIAN: Festus Aloe, MD  DIAGNOSIS: 69 y.o. gentleman with Stage T1c adenocarcinoma of the prostate with Gleason score of 4+3, and PSA of 10.5.    ICD-10-CM   1. Malignant neoplasm of prostate ([place])  C61     HISTORY OF PRESENT ILLNESS: Gregory Perry is a 69 y.o. male with a diagnosis of prostate cancer. He was noted to have an elevated PSA of 11.9 by his primary care provider, Kathe Becton, NP, on 11/10/2017. Accordingly, he was referred for evaluation in urology by Dr. Junious Silk on 03/16/2018,  digital rectal examination was performed at that time was normal. A repeat PSA at that time was decreased to 8.5 but remained elevated nonetheless. He was to be scheduled for prostate biopsy at that time, but he did not follow up.  Unfortunately, his PSA rose to 13.1 in 07/2018, and remained elevated at 10.4 when checked most recently in 09/2018. He was referred back to Dr. Junious Silk on 11/02/2018. The patient proceeded to transrectal ultrasound with 12 biopsies of the prostate on 12/22/2018.  The prostate volume measured 50 cc.  Out of 12 core biopsies, 6 were positive.  The maximum Gleason score was 4+3, and this was seen in left apex lateral. Gleason 3+4 was seen in left mid lateral, left mid, and left apex. Gleason 3+3 was seen in right apex and right mid lateral.  For disease staging, he underwent CT A/P on 01/08/2019, which was negative for lymph node involvement or other evidence of visceral metastatic disease.  Bone scan performed 02/08/19 was without evidence of osseous metastatic disease.  At the time of our initial consultation in 01/2019, he was interested in moving forward with 5.5 weeks of external beam therapy without ADT. His CT  SIM/planning was postponed for personal medical reasons and he had second thoughts regarding returning for radiation. However, he returns today for further discussion of potential radiation treatment options and is ready to move forward.  He had a recent PSA with his PCP on 10/08/19 which was stable elevated at 10.5.  PREVIOUS RADIATION THERAPY: No  PAST MEDICAL HISTORY:  Past Medical History:  Diagnosis Date  . Alcohol abuse   . Candida esophagitis ([place])   . Cardiomyopathy ([place])   . CVA (cerebral infarction)   . Duodenal ulcer   . Dysphagia   . Headache(784.0)    history of  . Hypertension   . Prostate cancer ([place]) 12/2018  . Severe protein-calorie malnutrition ([place])   . Stroke ([place]) 09/25/2019  . Vitamin D deficiency 09/2019      PAST SURGICAL HISTORY: Past Surgical History:  Procedure Laterality Date  . BALLOON DILATION N/A 11/22/2015   Procedure: BALLOON DILATION;  Surgeon: Mauri Pole, MD;  Location: [facility];  Service: Endoscopy;  Laterality: N/A;  . CARDIAC CATHETERIZATION N/A 01/17/2016   Procedure: Left Heart Cath and Coronary Angiography;  Surgeon: Sherren Mocha, MD;  Location: [facility];  Service: Cardiovascular;  Laterality: N/A;  . DIRECT LARYNGOSCOPY  05/07/2011   Procedure: DIRECT LARYNGOSCOPY;  Surgeon: Rozetta Nunnery, MD;  Location: [facility];  Service: ENT;  Laterality: N/A;  ESOPHAGOSCOPY  WITH DILATION  . EP IMPLANTABLE DEVICE N/A 11/17/2015   Procedure: Loop Recorder Insertion;  Surgeon: Evans Lance,  MD;  Location: [facility];  Service: Cardiovascular;  Laterality: N/A;  . ESOPHAGOGASTRODUODENOSCOPY  04/29/2011   Procedure: ESOPHAGOGASTRODUODENOSCOPY (EGD);  Surgeon: Winfield Cunas., MD;  Location: [facility];  Service: Endoscopy;  Laterality: N/A;  . ESOPHAGOGASTRODUODENOSCOPY N/A 11/22/2015   Procedure: ESOPHAGOGASTRODUODENOSCOPY (EGD);  Surgeon: Mauri Pole, MD;  Location: [facility];  Service: Endoscopy;   Laterality: N/A;  . IR CT HEAD LTD  09/26/2019  . IR PERCUTANEOUS ART THROMBECTOMY/INFUSION INTRACRANIAL INC DIAG ANGIO  09/26/2019  . IR US GUIDE VASC ACCESS RIGHT  09/26/2019  . LOOP RECORDER IMPLANT    . RADIOLOGY WITH ANESTHESIA N/A 09/26/2019   Procedure: IR WITH ANESTHESIA;  Surgeon: Luanne Bras, MD;  Location: [facility];  Service: Radiology;  Laterality: N/A;    FAMILY HISTORY:  Family History  Problem Relation Age of Onset  . Heart attack Maternal Grandfather   . Colon cancer Neg Hx   . Breast cancer Neg Hx   . Prostate cancer Neg Hx     SOCIAL HISTORY:  Social History   Socioeconomic History  . Marital status: Legally Separated    Spouse name: Not on file  . Number of children: 1  . Years of education: Not on file  . Highest education level: Not on file  Occupational History    Comment: retired  Tobacco Use  . Smoking status: Current Every Day Smoker    Packs/day: 0.50    Years: 30.00    Pack years: 15.00    Types: Cigarettes  . Smokeless tobacco: Never Used  . Tobacco comment: Pt smokes 6 to 7 a day trying to quit  Substance and Sexual Activity  . Alcohol use: No    Alcohol/week: 3.0 standard drinks    Types: 3 Shots of liquor per week    Comment: quit in 10/2015   . Drug use: Yes    Types: Marijuana    Comment: occ  . Sexual activity: Not Currently  Other Topics Concern  . Not on file  Social History Narrative  . Not on file   Social Determinants of Health   Financial Resource Strain:   . Difficulty of Paying Living Expenses:   Food Insecurity:   . Worried About Charity fundraiser in the Last Year:   . Arboriculturist in the Last Year:   Transportation Needs:   . Film/video editor (Medical):   Marland Kitchen Lack of Transportation (Non-Medical):   Physical Activity:   . Days of Exercise per Week:   . Minutes of Exercise per Session:   Stress:   . Feeling of Stress :   Social Connections:   . Frequency of Communication with Friends and Family:   .  Frequency of Social Gatherings with Friends and Family:   . Attends Religious Services:   . Active Member of Clubs or Organizations:   . Attends Archivist Meetings:   Marland Kitchen Marital Status:   Intimate Partner Violence:   . Fear of Current or Ex-Partner:   . Emotionally Abused:   Marland Kitchen Physically Abused:   . Sexually Abused:     ALLERGIES: Patient has no known allergies.  MEDICATIONS:  Current Outpatient Medications  Medication Sig Dispense Refill  . apixaban (ELIQUIS) 5 MG TABS tablet Take 1 tablet (5 mg total) by mouth 2 (two) times daily. 60 tablet 6  . atorvastatin (LIPITOR) 40 MG tablet Take 1 tablet (40 mg total) by mouth daily. 30 tablet 6  . Blood Pressure  Monitoring (BLOOD PRESSURE CUFF) MISC Check blood pressure daily between 8 pm -9 pm 1 each 0  . docusate sodium (COLACE) 100 MG capsule Take 1 capsule (100 mg total) by mouth 2 (two) times daily as needed for mild constipation. 30 capsule 6  . feeding supplement, ENSURE ENLIVE, (ENSURE ENLIVE) LIQD Take 237 mLs by mouth 2 (two) times daily between meals. 237 mL 12  . ferrous sulfate 325 (65 FE) MG tablet Take 1 tablet (325 mg total) by mouth 3 (three) times daily with meals. 90 tablet 6  . metoprolol tartrate (LOPRESSOR) 25 MG tablet Take 1 tablet (25 mg total) by mouth 2 (two) times daily. 60 tablet 6  . pantoprazole (PROTONIX) 40 MG tablet Take 1 tablet (40 mg total) by mouth daily. 30 tablet 2  . polyethylene glycol (MIRALAX / GLYCOLAX) 17 g packet Take 17 g by mouth daily as needed for moderate constipation. 14 each 6  . spironolactone (ALDACTONE) 25 MG tablet Take 1 tablet (25 mg total) by mouth daily. 30 tablet 6  . Vitamin D, Ergocalciferol, (DRISDOL) 1.25 MG (50000 UNIT) CAPS capsule Take 1 capsule (50,000 Units total) by mouth every 7 (seven) days. 5 capsule 6   No current facility-administered medications for this encounter.    REVIEW OF SYSTEMS:  On review of systems, the patient reports that he is doing well  overall. He denies any chest pain, shortness of breath, cough, fevers, chills, night sweats. He denies any bowel disturbances, and denies abdominal pain, nausea or vomiting. He denies any new musculoskeletal or joint aches or pains. He reports an unintentional weight loss of 10+ pounds in less than a month related to decreased appetite. His IPSS was 6, indicating mild urinary symptoms. He is not currently on any medications for BPH and denies obstructive urinary symptoms. His SHIM was 1, indicating he has severe erectile dysfunction. A complete review of systems is obtained and is otherwise negative.  PHYSICAL EXAM:  Wt Readings from Last 3 Encounters:  10/14/19 166 lb 9.6 oz (75.6 kg)  10/08/19 168 lb 6.4 oz (76.4 kg)  09/28/19 165 lb 9.1 oz (75.1 kg)   Temp Readings from Last 3 Encounters:  10/14/19 97.7 F (36.5 C)  10/08/19 97.7 F (36.5 C)  09/28/19 (!) 97.5 F (36.4 C) (Oral)   BP Readings from Last 3 Encounters:  10/14/19 (!) 160/102  10/08/19 (!) 147/84  09/28/19 (!) 140/94   Pulse Readings from Last 3 Encounters:  10/14/19 72  10/08/19 64  09/28/19 (!) 54    /10  In general this is a well appearing African [place] male in no acute distress. He's alert and oriented x4 and appropriate throughout the examination. Cardiopulmonary assessment is negative for acute distress and he exhibits normal effort.    KPS = 90  100 - Normal; no complaints; no evidence of disease. 90   - Able to carry on normal activity; minor signs or symptoms of disease. 80   - Normal activity with effort; some signs or symptoms of disease. 76   - Cares for self; unable to carry on normal activity or to do active work. 60   - Requires occasional assistance, but is able to care for most of his personal needs. 50   - Requires considerable assistance and frequent medical care. 51   - Disabled; requires special care and assistance. 74   - Severely disabled; hospital admission is indicated although death  not imminent. 50   - Very sick; hospital admission necessary;  active supportive treatment necessary. 10   - Moribund; fatal processes progressing rapidly. 0     - Dead  Karnofsky DA, Abelmann [place], Craver LS and Burchenal [facility] [phone]) The use of the nitrogen mustards in the palliative treatment of carcinoma: with particular reference to bronchogenic carcinoma Cancer 1 634-56  LABORATORY DATA:  Lab Results  Component Value Date   WBC 6.3 10/08/2019   HGB 10.7 (L) 10/08/2019   HCT 35.1 (L) 10/08/2019   MCV 78 (L) 10/08/2019   PLT 359 10/08/2019   Lab Results  Component Value Date   NA 138 09/28/2019   K 3.4 (L) 09/28/2019   CL 105 09/28/2019   CO2 25 09/28/2019   Lab Results  Component Value Date   ALT 10 08/03/2018   AST 24 08/03/2018   ALKPHOS 81 08/03/2018   BILITOT 0.6 08/03/2018     RADIOGRAPHY: CT Code Stroke CTA Head W/WO contrast  Result Date: 09/26/2019 CLINICAL DATA:  Right-sided gaze and neglect. EXAM: CT ANGIOGRAPHY HEAD AND NECK CT PERFUSION BRAIN TECHNIQUE: Multidetector CT imaging of the head and neck was performed using the standard protocol during bolus administration of intravenous contrast. Multiplanar CT image reconstructions and MIPs were obtained to evaluate the vascular anatomy. Carotid stenosis measurements (when applicable) are obtained utilizing NASCET criteria, using the distal internal carotid diameter as the denominator. Multiphase CT imaging of the brain was performed following IV bolus contrast injection. Subsequent parametric perfusion maps were calculated using RAPID software. CONTRAST:  137mL OMNIPAQUE IOHEXOL 350 MG/ML SOLN COMPARISON:  Head CT 09/26/2019 CTA neck 11/17/2015 FINDINGS: CTA NECK FINDINGS SKELETON: There is no bony spinal canal stenosis. No lytic or blastic lesion. OTHER NECK: Normal pharynx, larynx and major salivary glands. No cervical lymphadenopathy. Unremarkable thyroid gland. UPPER CHEST: No pneumothorax or pleural effusion. No nodules  or masses. AORTIC ARCH: There is no calcific atherosclerosis of the aortic arch. There is no aneurysm, dissection or hemodynamically significant stenosis of the visualized portion of the aorta. Conventional 3 vessel aortic branching pattern. The visualized proximal subclavian arteries are widely patent. RIGHT CAROTID SYSTEM: No dissection, occlusion or aneurysm. Mild atherosclerotic calcification at the carotid bifurcation without hemodynamically significant stenosis. LEFT CAROTID SYSTEM: No dissection, occlusion or aneurysm. Mild atherosclerotic calcification at the carotid bifurcation without hemodynamically significant stenosis. VERTEBRAL ARTERIES: Left dominant configuration. Both origins are clearly patent. There is no dissection, occlusion or flow-limiting stenosis to the skull base (V1-V3 segments). CTA HEAD FINDINGS POSTERIOR CIRCULATION: --Vertebral arteries: Normal V4 segments. --Posterior inferior cerebellar arteries (PICA): Patent origins from the vertebral arteries. --Anterior inferior cerebellar arteries (AICA): Patent origins from the basilar artery. --Basilar artery: Normal. --Superior cerebellar arteries: Normal. --Posterior cerebral arteries: Normal. Both originate from the basilar artery. Posterior communicating arteries (p-comm) are diminutive or absent. ANTERIOR CIRCULATION: --Intracranial internal carotid arteries: Normal. --Anterior cerebral arteries (ACA): Normal. Both A1 segments are present. Patent anterior communicating artery (a-comm). --Middle cerebral arteries (MCA): Occlusion of the right MCA distal M2 segment corresponding to the region of ischemia identified below. Left M2 occlusion is favored to be chronic. VENOUS SINUSES: As permitted by contrast timing, patent. ANATOMIC VARIANTS: None Review of the MIP images confirms the above findings. CT Brain Perfusion Findings: ASPECTS: 10 CBF (<30%) Volume: 34mL Perfusion (Tmax>6.0s) volume: 82mL Mismatch Volume: 41mL Infarction Location:The  identified region of core infarct corresponds to a chronic infarct in the right frontal lobe. IMPRESSION: 1. Occlusion of the right MCA distal M2 segment, corresponding to the region of ischemia in the posterior right MCA  territory. 2. Chronic left M2 occlusion. 3. 36 mL area at risk ischemic brain according to perfusion analysis. The calculated core infarct is artifactual and relates to a remote right frontal lobe infarct. Critical Value/emergent results were called by telephone at the time of interpretation on 09/26/2019 at 1:46 am to provider MCNEILL [facility] , who verbally acknowledged these results. Electronically Signed   By: Ulyses Jarred M.D.   On: 09/26/2019 01:46   CT Code Stroke CTA Neck W/WO contrast  Result Date: 09/26/2019 CLINICAL DATA:  Right-sided gaze and neglect. EXAM: CT ANGIOGRAPHY HEAD AND NECK CT PERFUSION BRAIN TECHNIQUE: Multidetector CT imaging of the head and neck was performed using the standard protocol during bolus administration of intravenous contrast. Multiplanar CT image reconstructions and MIPs were obtained to evaluate the vascular anatomy. Carotid stenosis measurements (when applicable) are obtained utilizing NASCET criteria, using the distal internal carotid diameter as the denominator. Multiphase CT imaging of the brain was performed following IV bolus contrast injection. Subsequent parametric perfusion maps were calculated using RAPID software. CONTRAST:  163mL OMNIPAQUE IOHEXOL 350 MG/ML SOLN COMPARISON:  Head CT 09/26/2019 CTA neck 11/17/2015 FINDINGS: CTA NECK FINDINGS SKELETON: There is no bony spinal canal stenosis. No lytic or blastic lesion. OTHER NECK: Normal pharynx, larynx and major salivary glands. No cervical lymphadenopathy. Unremarkable thyroid gland. UPPER CHEST: No pneumothorax or pleural effusion. No nodules or masses. AORTIC ARCH: There is no calcific atherosclerosis of the aortic arch. There is no aneurysm, dissection or hemodynamically significant  stenosis of the visualized portion of the aorta. Conventional 3 vessel aortic branching pattern. The visualized proximal subclavian arteries are widely patent. RIGHT CAROTID SYSTEM: No dissection, occlusion or aneurysm. Mild atherosclerotic calcification at the carotid bifurcation without hemodynamically significant stenosis. LEFT CAROTID SYSTEM: No dissection, occlusion or aneurysm. Mild atherosclerotic calcification at the carotid bifurcation without hemodynamically significant stenosis. VERTEBRAL ARTERIES: Left dominant configuration. Both origins are clearly patent. There is no dissection, occlusion or flow-limiting stenosis to the skull base (V1-V3 segments). CTA HEAD FINDINGS POSTERIOR CIRCULATION: --Vertebral arteries: Normal V4 segments. --Posterior inferior cerebellar arteries (PICA): Patent origins from the vertebral arteries. --Anterior inferior cerebellar arteries (AICA): Patent origins from the basilar artery. --Basilar artery: Normal. --Superior cerebellar arteries: Normal. --Posterior cerebral arteries: Normal. Both originate from the basilar artery. Posterior communicating arteries (p-comm) are diminutive or absent. ANTERIOR CIRCULATION: --Intracranial internal carotid arteries: Normal. --Anterior cerebral arteries (ACA): Normal. Both A1 segments are present. Patent anterior communicating artery (a-comm). --Middle cerebral arteries (MCA): Occlusion of the right MCA distal M2 segment corresponding to the region of ischemia identified below. Left M2 occlusion is favored to be chronic. VENOUS SINUSES: As permitted by contrast timing, patent. ANATOMIC VARIANTS: None Review of the MIP images confirms the above findings. CT Brain Perfusion Findings: ASPECTS: 10 CBF (<30%) Volume: 60mL Perfusion (Tmax>6.0s) volume: 86mL Mismatch Volume: 73mL Infarction Location:The identified region of core infarct corresponds to a chronic infarct in the right frontal lobe. IMPRESSION: 1. Occlusion of the right MCA distal M2  segment, corresponding to the region of ischemia in the posterior right MCA territory. 2. Chronic left M2 occlusion. 3. 36 mL area at risk ischemic brain according to perfusion analysis. The calculated core infarct is artifactual and relates to a remote right frontal lobe infarct. Critical Value/emergent results were called by telephone at the time of interpretation on 09/26/2019 at 1:46 am to provider MCNEILL [facility] , who verbally acknowledged these results. Electronically Signed   By: Ulyses Jarred M.D.   On: 09/26/2019 01:46  MR ANGIO HEAD WO CONTRAST  Result Date: 09/26/2019 CLINICAL DATA:  Stroke.  Right M2 thrombectomy 09/26/2019 EXAM: MRI HEAD WITHOUT CONTRAST MRA HEAD WITHOUT CONTRAST TECHNIQUE: Multiplanar, multiecho pulse sequences of the brain and surrounding structures were obtained without intravenous contrast. Angiographic images of the head were obtained using MRA technique without contrast. COMPARISON:  CTA and CT perfusion 09/26/2019 FINDINGS: MRI HEAD FINDINGS Brain: Small subcentimeter areas of acute infarct involving the right frontal white matter, right posterior insula, and right posterior parietal lobe. Chronic infarct right frontal parietal lobe. Chronic hemorrhagic infarct left occipital lobe. Moderate chronic microvascular ischemic changes in the white matter. Chronic ischemic change in the thalamus and cerebellum bilaterally. Negative for mass lesion. Mild atrophy.  Negative for hydrocephalus. Vascular: Normal arterial flow voids Skull and upper cervical spine: No focal skeletal lesion. Sinuses/Orbits: Mild mucosal edema paranasal sinuses. Bilateral cataract extraction Other: None MRA HEAD FINDINGS Both vertebral arteries patent to the basilar. PICA not visualized. Large AICA patent bilaterally. Basilar patent with irregularity distally which is felt to be motion related. Right posterior cerebral artery patent with artifact in the proximal segment. Stenosis versus artifact proximal  left PCA. Occlusion distal left PCA corresponding to chronic infarction. Atherosclerotic irregularity in the cavernous carotid bilaterally without significant stenosis. Mild stenosis proximal A1 segment bilaterally. Mild to moderate stenosis A2 segment bilaterally. Right M1 segment is widely patent. Previously noted occlusion of the right M2 branch is now patent. There is irregularity at the site of previous occlusion which may be due to plaque or residual clot. There is good distal flow. Left middle cerebral artery patent without significant stenosis. Negative for aneurysm. IMPRESSION: 1. Small subcentimeter areas of acute infarct in the right frontal white matter, right posterior insula, and right posterior parietal lobe. 2. Chronic infarct right frontal parietal lobe. Chronic hemorrhagic left PCA infarct. 3. Right M2 segment which was previously occluded is now patent with mild residual irregularity at the previous occlusion site. Electronically Signed   By: Franchot Gallo M.D.   On: 09/26/2019 18:31   MR BRAIN WO CONTRAST  Result Date: 09/26/2019 CLINICAL DATA:  Stroke.  Right M2 thrombectomy 09/26/2019 EXAM: MRI HEAD WITHOUT CONTRAST MRA HEAD WITHOUT CONTRAST TECHNIQUE: Multiplanar, multiecho pulse sequences of the brain and surrounding structures were obtained without intravenous contrast. Angiographic images of the head were obtained using MRA technique without contrast. COMPARISON:  CTA and CT perfusion 09/26/2019 FINDINGS: MRI HEAD FINDINGS Brain: Small subcentimeter areas of acute infarct involving the right frontal white matter, right posterior insula, and right posterior parietal lobe. Chronic infarct right frontal parietal lobe. Chronic hemorrhagic infarct left occipital lobe. Moderate chronic microvascular ischemic changes in the white matter. Chronic ischemic change in the thalamus and cerebellum bilaterally. Negative for mass lesion. Mild atrophy.  Negative for hydrocephalus. Vascular: Normal  arterial flow voids Skull and upper cervical spine: No focal skeletal lesion. Sinuses/Orbits: Mild mucosal edema paranasal sinuses. Bilateral cataract extraction Other: None MRA HEAD FINDINGS Both vertebral arteries patent to the basilar. PICA not visualized. Large AICA patent bilaterally. Basilar patent with irregularity distally which is felt to be motion related. Right posterior cerebral artery patent with artifact in the proximal segment. Stenosis versus artifact proximal left PCA. Occlusion distal left PCA corresponding to chronic infarction. Atherosclerotic irregularity in the cavernous carotid bilaterally without significant stenosis. Mild stenosis proximal A1 segment bilaterally. Mild to moderate stenosis A2 segment bilaterally. Right M1 segment is widely patent. Previously noted occlusion of the right M2 branch is now patent. There is irregularity  at the site of previous occlusion which may be due to plaque or residual clot. There is good distal flow. Left middle cerebral artery patent without significant stenosis. Negative for aneurysm. IMPRESSION: 1. Small subcentimeter areas of acute infarct in the right frontal white matter, right posterior insula, and right posterior parietal lobe. 2. Chronic infarct right frontal parietal lobe. Chronic hemorrhagic left PCA infarct. 3. Right M2 segment which was previously occluded is now patent with mild residual irregularity at the previous occlusion site. Electronically Signed   By: Franchot Gallo M.D.   On: 09/26/2019 18:31   IR CT Head Ltd  Result Date: 09/26/2019 INDICATION: 69 year old male presenting with acute right hemispheric ischemic stroke, right M2 occlusion, presenting for cerebral angiogram and attempt for thrombectomy EXAM: ULTRASOUND GUIDED ACCESS RIGHT COMMON FEMORAL ARTERY CERVICAL AND CEREBRAL ANGIOGRAM MECHANICAL THROMBECTOMY OF RIGHT MCA TERRITORY FLAT PANEL CT DEPLOYMENT OF ANGIO-SEAL COMPARISON:  CT IMAGING OF THE SAME DAY MEDICATIONS: 2 g  Ancef. The antibiotic was administered within 1 hour of the procedure ANESTHESIA/SEDATION: General endotracheal tube anesthesia CONTRAST:  60 cc Omnipaque 300 FLUOROSCOPY TIME:  Fluoroscopy Time: 8 minutes 18 seconds (1675 mGy). COMPLICATIONS: None TECHNIQUE: Informed written consent was obtained from the patient's family after a thorough discussion of the procedural risks, benefits and alternatives. Specific risks discussed include: Bleeding, infection, contrast reaction, kidney injury/failure, need for further procedure/surgery, arterial injury or dissection, embolization to new territory, intracranial hemorrhage (10-15% risk), neurologic deterioration, cardiopulmonary collapse, death. All questions were addressed. Maximal Sterile Barrier Technique was utilized including during the procedure including caps, mask, sterile gowns, sterile gloves, sterile drape, hand hygiene and skin antiseptic. A timeout was performed prior to the initiation of the procedure. FINDINGS: Initial Findings: Right common carotid artery:  Normal course caliber and contour. Right external carotid artery: Patent with antegrade flow. Right internal carotid artery: Normal course caliber and contour of the cervical portion. Vertical and petrous segment patent with normal course caliber contour. Cavernous segment patent. Clinoid segment patent. Antegrade flow of the ophthalmic artery. Ophthalmic segment patent. Terminus patent. Right MCA: M1 segment patent. Early temporal branch from the proximal MCA. Insular segments patent. Initial angiogram demonstrates angiographic cut off of superior division into the parietal territory, within the insular/operculum transition. TICI 0 flow beyond the occlusion. Right ACA: A 1 segment patent. A 2 segment perfuses the right territory. Patent anterior communicating artery. Completion Findings: Right MCA: After single pass thrombectomy, there is complete restoration of flow through the occluded segment to the  superior segment. TICI 3 flow restored Flat panel CT: No contrast staining or hemorrhage identified on the postcontrast CT. Redemonstration of remote infarcts of bilateral hemispheres, left PCA/inferior division territory, and on the right the likely remote infarction of the perirolandic parietal region. PROCEDURE: The anesthesia team was present to provide general endotracheal tube anesthesia and for patient monitoring during the procedure. Intubation was performed in negative pressure [place] in neuro IR holding. Interventional neuro radiology nursing staff was also present. Ultrasound survey of the right inguinal region was performed with images stored and sent to PACs. 11 blade scalpel was used to make a small incision. Blunt dissection was performed with US guidance. A micropuncture needle was used access the right common femoral artery under ultrasound. With excellent arterial blood flow returned, an .018 micro wire was passed through the needle, observed to enter the abdominal aorta under fluoroscopy. The needle was removed, and a micropuncture sheath was placed over the wire. The inner dilator and wire were removed, and  an 035 wire was advanced under fluoroscopy into the abdominal aorta. The sheath was removed and a 25cm 64F straight vascular sheath was placed. The dilator was removed and the sheath was flushed. Sheath was attached to pressurized and heparinized saline bag for constant forward flow. A coaxial system was then advanced over the 035 wire. This included a 95cm 087 "Walrus" balloon guide with coaxial 125cm Berenstein diagnostic catheter. This was advanced to the proximal descending thoracic aorta. Wire was then removed. Double flush of the catheter was performed. Catheter was then used to select the innominate artery. Angiogram was performed. Using roadmap technique, the catheter was advanced over a standard glide wire into right cervical ICA, with distal position achieved of the balloon guide. The  diagnostic catheter and the wire were removed. Formal angiogram was performed. Road map function was used once the occluded vessel was identified. Copious back flush was performed and the balloon catheter was attached to heparinized and pressurized saline bag for forward flow. A second coaxial system was then advanced through the balloon catheter, which included the selected intermediate catheter, microcatheter, and microwire. In this scenario, the set up included a 115cm CAT-5 intermediate catheter, a Trevo Provue18 microcatheter, and 014 synchro soft wire. This system was advanced through the balloon guide catheter under the road-map function, with adequate back-flush at the rotating hemostatic valve at that back end of the balloon guide. Once the wire and microcatheter were into the laceral segment, we attach heparinized saline pressured flush to the guide. Microcatheter and the intermediate catheter system were advanced through the terminal ICA and MCA to the level of the occlusion. The micro wire was then carefully advanced through the occluded segment. Microcatheter was then manipulated through the occluded segment and the wire was removed with saline drip at the hub. Blood was then aspirated through the hub of the microcatheter, and a gentle contrast injection was performed confirming intraluminal position. A rotating hemostatic valve was then attached to the back end of the microcatheter, and a pressurized and heparinized saline bag was attached to the catheter. 3 x 20 solitaire device was then selected. Back flush was achieved at the rotating hemostatic valve, and then the device was gently advanced through the microcatheter to the distal end. The retriever was then unsheathed by withdrawing the microcatheter under fluoroscopy. Once the retriever was completely unsheathed, control angiogram was performed from the balloon catheter. A 4 minute time interval was observed. The balloon at the balloon guide  catheter was then inflated under fluoroscopy for proximal flow arrest. Constant aspiration was then performed at the CAT-5 intermediate catheter, as the retriever was gently and slowly withdrawn with fluoroscopic observation. Once the retriever was entirely within the tip of the intermediate catheter, both were removed from the system. Free aspiration was confirmed at the hub of the balloon guide catheter, with free blood return confirmed. The balloon was then deflated, and a control angiogram was performed. Restoration of flow was confirmed. The skin at the puncture site was then cleaned with Hibiclens. The 8 French sheath was removed and an 64F angioseal was deployed. Flat panel CT was performed. Patient tolerated the procedure well and remained hemodynamically stable throughout. No complications were encountered and no significant blood loss encountered. IMPRESSION: Status post ultrasound guided access right common femoral artery for cervical and cerebral angiogram and mechanical thrombectomy of right MCA/M2 occlusion, with single pass, restoration of TICI-3 flow and deployment of Angio-Seal. Signed, Dulcy Fanny. Dellia Nims, [place]  [facility] PLAN: The patient will remain intubated, as he remains COVID status unknown ICU status Target systolic blood pressure of 120-140 Right hip straight time 6 hours Frequent neurovascular checks Repeat neurologic imaging with CT and/MRI at the discretion of neurology team Electronically Signed   By: Corrie Mckusick D.O.   On: 09/26/2019 04:31   IR US Guide Vasc Access Right  Result Date: 09/26/2019 INDICATION: 69 year old male presenting with acute right hemispheric ischemic stroke, right M2 occlusion, presenting for cerebral angiogram and attempt for thrombectomy EXAM: ULTRASOUND GUIDED ACCESS RIGHT COMMON FEMORAL ARTERY CERVICAL AND CEREBRAL ANGIOGRAM MECHANICAL THROMBECTOMY OF RIGHT MCA TERRITORY FLAT PANEL CT DEPLOYMENT  OF ANGIO-SEAL COMPARISON:  CT IMAGING OF THE SAME DAY MEDICATIONS: 2 g Ancef. The antibiotic was administered within 1 hour of the procedure ANESTHESIA/SEDATION: General endotracheal tube anesthesia CONTRAST:  60 cc Omnipaque 300 FLUOROSCOPY TIME:  Fluoroscopy Time: 8 minutes 18 seconds (1675 mGy). COMPLICATIONS: None TECHNIQUE: Informed written consent was obtained from the patient's family after a thorough discussion of the procedural risks, benefits and alternatives. Specific risks discussed include: Bleeding, infection, contrast reaction, kidney injury/failure, need for further procedure/surgery, arterial injury or dissection, embolization to new territory, intracranial hemorrhage (10-15% risk), neurologic deterioration, cardiopulmonary collapse, death. All questions were addressed. Maximal Sterile Barrier Technique was utilized including during the procedure including caps, mask, sterile gowns, sterile gloves, sterile drape, hand hygiene and skin antiseptic. A timeout was performed prior to the initiation of the procedure. FINDINGS: Initial Findings: Right common carotid artery:  Normal course caliber and contour. Right external carotid artery: Patent with antegrade flow. Right internal carotid artery: Normal course caliber and contour of the cervical portion. Vertical and petrous segment patent with normal course caliber contour. Cavernous segment patent. Clinoid segment patent. Antegrade flow of the ophthalmic artery. Ophthalmic segment patent. Terminus patent. Right MCA: M1 segment patent. Early temporal branch from the proximal MCA. Insular segments patent. Initial angiogram demonstrates angiographic cut off of superior division into the parietal territory, within the insular/operculum transition. TICI 0 flow beyond the occlusion. Right ACA: A 1 segment patent. A 2 segment perfuses the right territory. Patent anterior communicating artery. Completion Findings: Right MCA: After single pass thrombectomy,  there is complete restoration of flow through the occluded segment to the superior segment. TICI 3 flow restored Flat panel CT: No contrast staining or hemorrhage identified on the postcontrast CT. Redemonstration of remote infarcts of bilateral hemispheres, left PCA/inferior division territory, and on the right the likely remote infarction of the perirolandic parietal region. PROCEDURE: The anesthesia team was present to provide general endotracheal tube anesthesia and for patient monitoring during the procedure. Intubation was performed in negative pressure [place] in neuro IR holding. Interventional neuro radiology nursing staff was also present. Ultrasound survey of the right inguinal region was performed with images stored and sent to PACs. 11 blade scalpel was used to make a small incision. Blunt dissection was performed with US guidance. A micropuncture needle was used access the right common femoral artery under ultrasound. With excellent arterial blood flow returned, an .018 micro wire was passed through the needle, observed to enter the abdominal aorta under fluoroscopy. The needle was removed, and a micropuncture sheath was placed over the wire. The inner dilator and wire were removed, and an 035 wire was advanced under fluoroscopy into the abdominal aorta. The sheath was removed and a 25cm 53F straight vascular sheath was placed. The dilator was removed and the sheath was flushed. Sheath was attached to pressurized and heparinized saline  bag for constant forward flow. A coaxial system was then advanced over the 035 wire. This included a 95cm 087 "Walrus" balloon guide with coaxial 125cm Berenstein diagnostic catheter. This was advanced to the proximal descending thoracic aorta. Wire was then removed. Double flush of the catheter was performed. Catheter was then used to select the innominate artery. Angiogram was performed. Using roadmap technique, the catheter was advanced over a standard glide wire into  right cervical ICA, with distal position achieved of the balloon guide. The diagnostic catheter and the wire were removed. Formal angiogram was performed. Road map function was used once the occluded vessel was identified. Copious back flush was performed and the balloon catheter was attached to heparinized and pressurized saline bag for forward flow. A second coaxial system was then advanced through the balloon catheter, which included the selected intermediate catheter, microcatheter, and microwire. In this scenario, the set up included a 115cm CAT-5 intermediate catheter, a Trevo Provue18 microcatheter, and 014 synchro soft wire. This system was advanced through the balloon guide catheter under the road-map function, with adequate back-flush at the rotating hemostatic valve at that back end of the balloon guide. Once the wire and microcatheter were into the laceral segment, we attach heparinized saline pressured flush to the guide. Microcatheter and the intermediate catheter system were advanced through the terminal ICA and MCA to the level of the occlusion. The micro wire was then carefully advanced through the occluded segment. Microcatheter was then manipulated through the occluded segment and the wire was removed with saline drip at the hub. Blood was then aspirated through the hub of the microcatheter, and a gentle contrast injection was performed confirming intraluminal position. A rotating hemostatic valve was then attached to the back end of the microcatheter, and a pressurized and heparinized saline bag was attached to the catheter. 3 x 20 solitaire device was then selected. Back flush was achieved at the rotating hemostatic valve, and then the device was gently advanced through the microcatheter to the distal end. The retriever was then unsheathed by withdrawing the microcatheter under fluoroscopy. Once the retriever was completely unsheathed, control angiogram was performed from the balloon catheter. A  4 minute time interval was observed. The balloon at the balloon guide catheter was then inflated under fluoroscopy for proximal flow arrest. Constant aspiration was then performed at the CAT-5 intermediate catheter, as the retriever was gently and slowly withdrawn with fluoroscopic observation. Once the retriever was entirely within the tip of the intermediate catheter, both were removed from the system. Free aspiration was confirmed at the hub of the balloon guide catheter, with free blood return confirmed. The balloon was then deflated, and a control angiogram was performed. Restoration of flow was confirmed. The skin at the puncture site was then cleaned with Hibiclens. The 8 French sheath was removed and an 56F angioseal was deployed. Flat panel CT was performed. Patient tolerated the procedure well and remained hemodynamically stable throughout. No complications were encountered and no significant blood loss encountered. IMPRESSION: Status post ultrasound guided access right common femoral artery for cervical and cerebral angiogram and mechanical thrombectomy of right MCA/M2 occlusion, with single pass, restoration of TICI-3 flow and deployment of Angio-Seal. Signed, Dulcy Fanny. Dellia Nims, [facility] Radiology PLAN: The patient will remain intubated, as he remains COVID status unknown ICU status Target systolic blood pressure of 120-140 Right hip straight time 6 hours Frequent neurovascular checks Repeat neurologic imaging with CT and/MRI at the discretion of neurology  team Electronically Signed   By: Corrie Mckusick D.O.   On: 09/26/2019 04:31   CT Code Stroke Cerebral Perfusion with contrast  Result Date: 09/26/2019 CLINICAL DATA:  Right-sided gaze and neglect. EXAM: CT ANGIOGRAPHY HEAD AND NECK CT PERFUSION BRAIN TECHNIQUE: Multidetector CT imaging of the head and neck was performed using the standard protocol during bolus administration of intravenous  contrast. Multiplanar CT image reconstructions and MIPs were obtained to evaluate the vascular anatomy. Carotid stenosis measurements (when applicable) are obtained utilizing NASCET criteria, using the distal internal carotid diameter as the denominator. Multiphase CT imaging of the brain was performed following IV bolus contrast injection. Subsequent parametric perfusion maps were calculated using RAPID software. CONTRAST:  132mL OMNIPAQUE IOHEXOL 350 MG/ML SOLN COMPARISON:  Head CT 09/26/2019 CTA neck 11/17/2015 FINDINGS: CTA NECK FINDINGS SKELETON: There is no bony spinal canal stenosis. No lytic or blastic lesion. OTHER NECK: Normal pharynx, larynx and major salivary glands. No cervical lymphadenopathy. Unremarkable thyroid gland. UPPER CHEST: No pneumothorax or pleural effusion. No nodules or masses. AORTIC ARCH: There is no calcific atherosclerosis of the aortic arch. There is no aneurysm, dissection or hemodynamically significant stenosis of the visualized portion of the aorta. Conventional 3 vessel aortic branching pattern. The visualized proximal subclavian arteries are widely patent. RIGHT CAROTID SYSTEM: No dissection, occlusion or aneurysm. Mild atherosclerotic calcification at the carotid bifurcation without hemodynamically significant stenosis. LEFT CAROTID SYSTEM: No dissection, occlusion or aneurysm. Mild atherosclerotic calcification at the carotid bifurcation without hemodynamically significant stenosis. VERTEBRAL ARTERIES: Left dominant configuration. Both origins are clearly patent. There is no dissection, occlusion or flow-limiting stenosis to the skull base (V1-V3 segments). CTA HEAD FINDINGS POSTERIOR CIRCULATION: --Vertebral arteries: Normal V4 segments. --Posterior inferior cerebellar arteries (PICA): Patent origins from the vertebral arteries. --Anterior inferior cerebellar arteries (AICA): Patent origins from the basilar artery. --Basilar artery: Normal. --Superior cerebellar arteries:  Normal. --Posterior cerebral arteries: Normal. Both originate from the basilar artery. Posterior communicating arteries (p-comm) are diminutive or absent. ANTERIOR CIRCULATION: --Intracranial internal carotid arteries: Normal. --Anterior cerebral arteries (ACA): Normal. Both A1 segments are present. Patent anterior communicating artery (a-comm). --Middle cerebral arteries (MCA): Occlusion of the right MCA distal M2 segment corresponding to the region of ischemia identified below. Left M2 occlusion is favored to be chronic. VENOUS SINUSES: As permitted by contrast timing, patent. ANATOMIC VARIANTS: None Review of the MIP images confirms the above findings. CT Brain Perfusion Findings: ASPECTS: 10 CBF (<30%) Volume: 48mL Perfusion (Tmax>6.0s) volume: 66mL Mismatch Volume: 26mL Infarction Location:The identified region of core infarct corresponds to a chronic infarct in the right frontal lobe. IMPRESSION: 1. Occlusion of the right MCA distal M2 segment, corresponding to the region of ischemia in the posterior right MCA territory. 2. Chronic left M2 occlusion. 3. 36 mL area at risk ischemic brain according to perfusion analysis. The calculated core infarct is artifactual and relates to a remote right frontal lobe infarct. Critical Value/emergent results were called by telephone at the time of interpretation on 09/26/2019 at 1:46 am to provider MCNEILL [facility] , who verbally acknowledged these results. Electronically Signed   By: Ulyses Jarred M.D.   On: 09/26/2019 01:46   DG CHEST PORT 1 VIEW  Result Date: 09/26/2019 CLINICAL DATA:  CVA EXAM: PORTABLE CHEST 1 VIEW COMPARISON:  08/04/2009 chest radiograph. FINDINGS: Endotracheal tube tip is 4.7 cm above the carina. Loop recorder device overlies the left heart. Stable cardiomediastinal silhouette with top-normal heart size. No pneumothorax. No pleural effusion. No overt pulmonary edema. Mild left basilar  atelectasis. IMPRESSION: 1. Endotracheal tube tip is 4.7 cm  above the carina. 2. Mild left basilar atelectasis. Electronically Signed   By: Ilona Sorrel M.D.   On: 09/26/2019 04:32   ECHOCARDIOGRAM COMPLETE  Result Date: 09/27/2019    ECHOCARDIOGRAM REPORT   Patient Name:   ESDRAS MARCOE Date of Exam: 09/27/2019 Medical Rec #:  [id number]         Height:       71.0 in Accession #:    [id number]        Weight:       165.6 lb Date of Birth:  [birth year]          BSA:          1.946 m Patient Age:    26 years          BP:           114/69 mmHg Patient Gender: M                 HR:           61 bpm. Exam Location:  Inpatient Procedure: 2D Echo Indications:    stroke 434.91  History:        Patient has prior history of Echocardiogram examinations, most                 recent 05/15/2017.  Sonographer:    Johny Chess Referring Phys: [email or web address].Osler MCNEILL P Ed[place]  1. Left ventricular ejection fraction, by estimation, is 40 to 45%. The left ventricle has mildly decreased function. The left ventricle demonstrates global hypokinesis. There is mild left ventricular hypertrophy. Left ventricular diastolic parameters are consistent with Grade II diastolic dysfunction (pseudonormalization).  2. Right ventricular systolic function is normal. The right ventricular size is normal. There is normal pulmonary artery systolic pressure.  3. Left atrial size was mildly dilated.  4. The mitral valve is normal in structure. Trivial mitral valve regurgitation. No evidence of mitral stenosis.  5. The aortic valve is normal in structure. Aortic valve regurgitation is not visualized. No aortic stenosis is present.  6. The inferior vena cava is normal in size with greater than 50% respiratory variability, suggesting right atrial pressure of 3 mmHg. Comparison(s): The left ventricular hypertrophy has improved. Conclusion(s)/Recommendation(s): No intracardiac source of embolism detected on this transthoracic study. A transesophageal echocardiogram is recommended to exclude cardiac source of  embolism if clinically indicated. FINDINGS  Left Ventricle: Left ventricular ejection fraction, by estimation, is 40 to 45%. The left ventricle has mildly decreased function. The left ventricle demonstrates global hypokinesis. The left ventricular internal cavity size was normal in size. There is  mild left ventricular hypertrophy. Left ventricular diastolic parameters are consistent with Grade II diastolic dysfunction (pseudonormalization). Right Ventricle: The right ventricular size is normal. No increase in right ventricular wall thickness. Right ventricular systolic function is normal. There is normal pulmonary artery systolic pressure. The tricuspid regurgitant velocity is 2.29 m/s, and  with an assumed right atrial pressure of 3 mmHg, the estimated right ventricular systolic pressure is 0000000 mmHg. Left Atrium: Left atrial size was mildly dilated. Right Atrium: Right atrial size was normal in size. Pericardium: There is no evidence of pericardial effusion. Mitral Valve: The mitral valve is normal in structure. Normal mobility of the mitral valve leaflets. Trivial mitral valve regurgitation. No evidence of mitral valve stenosis. Tricuspid Valve: The tricuspid valve is normal in structure. Tricuspid valve regurgitation is trivial. No evidence of tricuspid stenosis. Aortic Valve:  The aortic valve is normal in structure. Aortic valve regurgitation is not visualized. No aortic stenosis is present. Pulmonic Valve: The pulmonic valve was normal in structure. Pulmonic valve regurgitation is trivial. No evidence of pulmonic stenosis. Aorta: The aortic root is normal in size and structure. Venous: The inferior vena cava is normal in size with greater than 50% respiratory variability, suggesting right atrial pressure of 3 mmHg. IAS/Shunts: No atrial level shunt detected by color flow Doppler.  LEFT VENTRICLE PLAX 2D LVIDd:         5.80 cm  Diastology LVIDs:         4.30 cm  LV e' lateral:   8.27 cm/s LV PW:         1.20  cm  LV E/e' lateral: 13.1 LV IVS:        1.10 cm  LV e' medial:    6.53 cm/s LVOT diam:     2.10 cm  LV E/e' medial:  16.5 LV SV:         94 LV SV Index:   48 LVOT Area:     3.46 cm  RIGHT VENTRICLE             IVC RV S prime:     12.70 cm/s  IVC diam: 1.90 cm TAPSE (M-mode): 2.5 cm LEFT ATRIUM             Index       RIGHT ATRIUM           Index LA diam:        4.10 cm 2.11 cm/m  RA Area:     16.80 cm LA Vol (A2C):   78.5 ml 40.34 ml/m RA Volume:   42.70 ml  21.94 ml/m LA Vol (A4C):   76.0 ml 39.06 ml/m LA Biplane Vol: 83.3 ml 42.81 ml/m  AORTIC VALVE LVOT Vmax:   133.00 cm/s LVOT Vmean:  94.500 cm/s LVOT VTI:    0.272 m  AORTA Ao Root diam: 3.30 cm MITRAL VALVE                TRICUSPID VALVE MV Area (PHT): 3.65 cm     TR Peak grad:   21.0 mmHg MV Decel Time: 208 msec     TR Vmax:        229.00 cm/s MV E velocity: 108.00 cm/s MV A velocity: 91.80 cm/s   SHUNTS MV E/A ratio:  1.18         Systemic VTI:  0.27 m                             Systemic Diam: 2.10 cm Candee Furbish MD Electronically signed by Candee Furbish MD Signature Date/Time: 09/27/2019/11:11:12 AM    Final    IR PERCUTANEOUS ART THROMBECTOMY/INFUSION INTRACRANIAL INC DIAG ANGIO  Result Date: 09/26/2019 INDICATION: 69 year old male presenting with acute right hemispheric ischemic stroke, right M2 occlusion, presenting for cerebral angiogram and attempt for thrombectomy EXAM: ULTRASOUND GUIDED ACCESS RIGHT COMMON FEMORAL ARTERY CERVICAL AND CEREBRAL ANGIOGRAM MECHANICAL THROMBECTOMY OF RIGHT MCA TERRITORY FLAT PANEL CT DEPLOYMENT OF ANGIO-SEAL COMPARISON:  CT IMAGING OF THE SAME DAY MEDICATIONS: 2 g Ancef. The antibiotic was administered within 1 hour of the procedure ANESTHESIA/SEDATION: General endotracheal tube anesthesia CONTRAST:  60 cc Omnipaque 300 FLUOROSCOPY TIME:  Fluoroscopy Time: 8 minutes 18 seconds (1675 mGy). COMPLICATIONS: None TECHNIQUE: Informed written consent was obtained from the patient's family after a thorough discussion  of  the procedural risks, benefits and alternatives. Specific risks discussed include: Bleeding, infection, contrast reaction, kidney injury/failure, need for further procedure/surgery, arterial injury or dissection, embolization to new territory, intracranial hemorrhage (10-15% risk), neurologic deterioration, cardiopulmonary collapse, death. All questions were addressed. Maximal Sterile Barrier Technique was utilized including during the procedure including caps, mask, sterile gowns, sterile gloves, sterile drape, hand hygiene and skin antiseptic. A timeout was performed prior to the initiation of the procedure. FINDINGS: Initial Findings: Right common carotid artery:  Normal course caliber and contour. Right external carotid artery: Patent with antegrade flow. Right internal carotid artery: Normal course caliber and contour of the cervical portion. Vertical and petrous segment patent with normal course caliber contour. Cavernous segment patent. Clinoid segment patent. Antegrade flow of the ophthalmic artery. Ophthalmic segment patent. Terminus patent. Right MCA: M1 segment patent. Early temporal branch from the proximal MCA. Insular segments patent. Initial angiogram demonstrates angiographic cut off of superior division into the parietal territory, within the insular/operculum transition. TICI 0 flow beyond the occlusion. Right ACA: A 1 segment patent. A 2 segment perfuses the right territory. Patent anterior communicating artery. Completion Findings: Right MCA: After single pass thrombectomy, there is complete restoration of flow through the occluded segment to the superior segment. TICI 3 flow restored Flat panel CT: No contrast staining or hemorrhage identified on the postcontrast CT. Redemonstration of remote infarcts of bilateral hemispheres, left PCA/inferior division territory, and on the right the likely remote infarction of the perirolandic parietal region. PROCEDURE: The anesthesia team was present to  provide general endotracheal tube anesthesia and for patient monitoring during the procedure. Intubation was performed in negative pressure [place] in neuro IR holding. Interventional neuro radiology nursing staff was also present. Ultrasound survey of the right inguinal region was performed with images stored and sent to PACs. 11 blade scalpel was used to make a small incision. Blunt dissection was performed with US guidance. A micropuncture needle was used access the right common femoral artery under ultrasound. With excellent arterial blood flow returned, an .018 micro wire was passed through the needle, observed to enter the abdominal aorta under fluoroscopy. The needle was removed, and a micropuncture sheath was placed over the wire. The inner dilator and wire were removed, and an 035 wire was advanced under fluoroscopy into the abdominal aorta. The sheath was removed and a 25cm 66F straight vascular sheath was placed. The dilator was removed and the sheath was flushed. Sheath was attached to pressurized and heparinized saline bag for constant forward flow. A coaxial system was then advanced over the 035 wire. This included a 95cm 087 "Walrus" balloon guide with coaxial 125cm Berenstein diagnostic catheter. This was advanced to the proximal descending thoracic aorta. Wire was then removed. Double flush of the catheter was performed. Catheter was then used to select the innominate artery. Angiogram was performed. Using roadmap technique, the catheter was advanced over a standard glide wire into right cervical ICA, with distal position achieved of the balloon guide. The diagnostic catheter and the wire were removed. Formal angiogram was performed. Road map function was used once the occluded vessel was identified. Copious back flush was performed and the balloon catheter was attached to heparinized and pressurized saline bag for forward flow. A second coaxial system was then advanced through the balloon catheter,  which included the selected intermediate catheter, microcatheter, and microwire. In this scenario, the set up included a 115cm CAT-5 intermediate catheter, a Trevo Provue18 microcatheter, and 014 synchro soft wire. This system was  advanced through the balloon guide catheter under the road-map function, with adequate back-flush at the rotating hemostatic valve at that back end of the balloon guide. Once the wire and microcatheter were into the laceral segment, we attach heparinized saline pressured flush to the guide. Microcatheter and the intermediate catheter system were advanced through the terminal ICA and MCA to the level of the occlusion. The micro wire was then carefully advanced through the occluded segment. Microcatheter was then manipulated through the occluded segment and the wire was removed with saline drip at the hub. Blood was then aspirated through the hub of the microcatheter, and a gentle contrast injection was performed confirming intraluminal position. A rotating hemostatic valve was then attached to the back end of the microcatheter, and a pressurized and heparinized saline bag was attached to the catheter. 3 x 20 solitaire device was then selected. Back flush was achieved at the rotating hemostatic valve, and then the device was gently advanced through the microcatheter to the distal end. The retriever was then unsheathed by withdrawing the microcatheter under fluoroscopy. Once the retriever was completely unsheathed, control angiogram was performed from the balloon catheter. A 4 minute time interval was observed. The balloon at the balloon guide catheter was then inflated under fluoroscopy for proximal flow arrest. Constant aspiration was then performed at the CAT-5 intermediate catheter, as the retriever was gently and slowly withdrawn with fluoroscopic observation. Once the retriever was entirely within the tip of the intermediate catheter, both were removed from the system. Free aspiration  was confirmed at the hub of the balloon guide catheter, with free blood return confirmed. The balloon was then deflated, and a control angiogram was performed. Restoration of flow was confirmed. The skin at the puncture site was then cleaned with Hibiclens. The 8 French sheath was removed and an 83F angioseal was deployed. Flat panel CT was performed. Patient tolerated the procedure well and remained hemodynamically stable throughout. No complications were encountered and no significant blood loss encountered. IMPRESSION: Status post ultrasound guided access right common femoral artery for cervical and cerebral angiogram and mechanical thrombectomy of right MCA/M2 occlusion, with single pass, restoration of TICI-3 flow and deployment of Angio-Seal. Signed, Dulcy Fanny. Dellia Nims, RPVI [facility] Radiology PLAN: The patient will remain intubated, as he remains COVID status unknown ICU status Target systolic blood pressure of 120-140 Right hip straight time 6 hours Frequent neurovascular checks Repeat neurologic imaging with CT and/MRI at the discretion of neurology team Electronically Signed   By: Corrie Mckusick D.O.   On: 09/26/2019 04:31   CT HEAD CODE STROKE WO CONTRAST  Result Date: 09/26/2019 CLINICAL DATA:  Code stroke.  Right-sided gaze EXAM: CT HEAD WITHOUT CONTRAST TECHNIQUE: Contiguous axial images were obtained from the base of the skull through the vertex without intravenous contrast. COMPARISON:  11/17/2015 FINDINGS: Brain: There is no mass, hemorrhage or extra-axial collection. There is generalized atrophy without lobar predilection. There is hypoattenuation of the periventricular white matter, most commonly indicating chronic ischemic microangiopathy. There are multiple old infarcts, the largest of which is in the left PCA territory. Vascular: No abnormal hyperdensity of the major intracranial arteries or dural venous sinuses. No intracranial  atherosclerosis. Skull: The visualized skull base, calvarium and extracranial soft tissues are normal. Sinuses/Orbits: No fluid levels or advanced mucosal thickening of the visualized paranasal sinuses. No mastoid or middle ear effusion. The orbits are normal. ASPECTS [facility] Stroke Program Early CT Score) - Ganglionic level infarction (caudate, lentiform nuclei,  internal capsule, insula, M1-M3 cortex): 7 - Supraganglionic infarction (M4-M6 cortex): 3 Total score (0-10 with 10 being normal): 10 IMPRESSION: 1. No acute intracranial abnormality. 2. ASPECTS is 10. 3. Multiple old infarcts and chronic ischemic microangiopathy. These results were communicated to Dr. Roland Rack at 1:11 am on 09/26/2019 by text page via the [facility] messaging system. * Electronically Signed   By: Ulyses Jarred M.D.   On: 09/26/2019 01:12      IMPRESSION/PLAN: 1. 69 y.o. gentleman with Stage T1c adenocarcinoma of the prostate with Gleason Score of 4+3, and PSA of 10.5.  We discussed the patient's workup and outlined the nature of prostate cancer in this setting. The patient's T stage, Gleason's score, and PSA put him into the unfavorable intermediate risk group. Accordingly, he is eligible for a variety of potential treatment options including brachytherapy, 5.5 weeks of external radiation or prostatectomy. We discussed the available radiation techniques, and focused on the details and logistics and delivery. We discussed and outlined the risks, benefits, short and long-term effects associated with radiotherapy and compared and contrasted these with prostatectomy. We discussed the role of SpaceOAR in reducing the rectal toxicity associated with radiotherapy. We also detailed the role of ADT in the treatment of unfavorable intermediate risk prostate cancer and outlined the associated side effects that could be expected with this therapy.  This is not strongly recommended and may carry more potential harm than benefit given his  heart disease.  He is also felt to be a high risk surgical candidate given his multiple medical co-morbidities and need for chronic anticoagulation. He was encouraged to ask questions that were answered to his stated satisfaction.  At the end of the conversation, the patient is interested in moving forward with 5.5 weeks of external beam therapy without ADT. He has had fiducial markers and SpaceOAR gel placement back in Jan. 2021, to reduce rectal toxicity from radiotherapy. MRI was performed on 05/31/2019 as well and we will use this for planning purposes understanding the limitation of SpaceOAR gel dissipation in the interval of time between placement and readiness for treatment.  He will have his CT simulation for treatment planning following our visit today, in anticipation of beginning his daily treatments in the near future.  We will share our discussion with Dr. Junious Silk and move forward with treatment planning accordingly.    Nicholos Johns, PA-C    Tyler Pita, MD  [facility] Direct Dial: [phone]  Fax: [phone] [email or web address]  Skype  LinkedIn

## 2019-10-21 NOTE — Progress Notes (Signed)
See progress note under physician encounter. 

## 2019-10-21 NOTE — Progress Notes (Signed)
Patient has CT simulation for prostate radiation 1/5 and then he decided against radiaiton. After several attempts to speak with him, I reached out to his primary care office and spoke with Kathe Becton, NP. I explained the situation and she is willing to speak with patient and encourage him to move forward with treatment. I received a call today from [facility] and patient would like to move forward with treatment. I asked Lanelle Bal to call Suanne Marker to get patient rescheduled.

## 2019-10-25 NOTE — Progress Notes (Signed)
  Radiation Oncology         (336) [phone] ________________________________  Name: Gregory Perry MRN: [id number]  Date: 10/21/2019  DOB: December 31, 1950  SIMULATION AND TREATMENT PLANNING NOTE    ICD-10-CM   1. Malignant neoplasm of prostate ([place])  C61     DIAGNOSIS:  69 y.o. gentleman with Stage T1c adenocarcinoma of the prostate with Gleason score of 4+3, and PSA of 10.5.  NARRATIVE:  The patient was brought to the [facility].  Identity was confirmed.  All relevant records and images related to the planned course of therapy were reviewed.  The patient freely provided informed written consent to proceed with treatment after reviewing the details related to the planned course of therapy. The consent form was witnessed and verified by the simulation staff.  Then, the patient was set-up in a stable reproducible supine position for radiation therapy.  A vacuum lock pillow device was custom fabricated to position his legs in a reproducible immobilized position.  Then, I performed a urethrogram under sterile conditions to identify the prostatic apex.  CT images were obtained.  Surface markings were placed.  The CT images were loaded into the planning software.  Then the prostate target and avoidance structures including the rectum, bladder, bowel and hips were contoured.  Treatment planning then occurred.  The radiation prescription was entered and confirmed.  A total of one complex treatment devices was fabricated. I have requested : Intensity Modulated Radiotherapy (IMRT) is medically necessary for this case for the following reason:  Rectal sparing.Marland Kitchen  PLAN:  The patient will receive 70 Gy in 28 fractions.  ________________________________  Sheral Apley Tammi Klippel, M.D.

## 2019-10-26 DIAGNOSIS — C61 Malignant neoplasm of prostate: Secondary | ICD-10-CM | POA: Insufficient documentation

## 2019-10-30 DIAGNOSIS — I69354 Hemiplegia and hemiparesis following cerebral infarction affecting left non-dominant side: Secondary | ICD-10-CM | POA: Diagnosis not present

## 2019-11-02 ENCOUNTER — Ambulatory Visit
Admission: RE | Admit: 2019-11-02 | Discharge: 2019-11-02 | Disposition: A | Discharge status: Home / Self Care | Payer: Medicare Other | Source: Ambulatory Visit | Attending: Radiation Oncology | Admitting: Radiation Oncology

## 2019-11-02 ENCOUNTER — Other Ambulatory Visit: Payer: Self-pay

## 2019-11-02 ENCOUNTER — Encounter: Payer: Self-pay | Admitting: Medical Oncology

## 2019-11-02 DIAGNOSIS — C61 Malignant neoplasm of prostate: Secondary | ICD-10-CM | POA: Diagnosis not present

## 2019-11-03 ENCOUNTER — Ambulatory Visit
Admission: RE | Admit: 2019-11-03 | Discharge: 2019-11-03 | Disposition: A | Discharge status: Home / Self Care | Payer: Medicare Other | Source: Ambulatory Visit | Attending: Radiation Oncology | Admitting: Radiation Oncology

## 2019-11-03 ENCOUNTER — Other Ambulatory Visit: Payer: Self-pay

## 2019-11-03 DIAGNOSIS — C61 Malignant neoplasm of prostate: Secondary | ICD-10-CM | POA: Diagnosis not present

## 2019-11-04 ENCOUNTER — Other Ambulatory Visit: Payer: Self-pay

## 2019-11-04 ENCOUNTER — Ambulatory Visit
Admission: RE | Admit: 2019-11-04 | Discharge: 2019-11-04 | Disposition: A | Discharge status: Home / Self Care | Payer: Medicare Other | Source: Ambulatory Visit | Attending: Radiation Oncology | Admitting: Radiation Oncology

## 2019-11-04 DIAGNOSIS — C61 Malignant neoplasm of prostate: Secondary | ICD-10-CM | POA: Diagnosis not present

## 2019-11-05 ENCOUNTER — Ambulatory Visit
Admission: RE | Admit: 2019-11-05 | Discharge: 2019-11-05 | Disposition: A | Discharge status: Home / Self Care | Payer: Medicare Other | Source: Ambulatory Visit | Attending: Radiation Oncology | Admitting: Radiation Oncology

## 2019-11-05 ENCOUNTER — Other Ambulatory Visit: Payer: Self-pay

## 2019-11-05 DIAGNOSIS — C61 Malignant neoplasm of prostate: Secondary | ICD-10-CM | POA: Diagnosis not present

## 2019-11-08 ENCOUNTER — Other Ambulatory Visit: Payer: Self-pay

## 2019-11-08 ENCOUNTER — Ambulatory Visit: Payer: Medicare Other | Admitting: Family Medicine

## 2019-11-08 ENCOUNTER — Ambulatory Visit
Admission: RE | Admit: 2019-11-08 | Discharge: 2019-11-08 | Disposition: A | Discharge status: Home / Self Care | Payer: Medicare Other | Source: Ambulatory Visit | Attending: Radiation Oncology | Admitting: Radiation Oncology

## 2019-11-08 DIAGNOSIS — C61 Malignant neoplasm of prostate: Secondary | ICD-10-CM | POA: Diagnosis not present

## 2019-11-09 ENCOUNTER — Other Ambulatory Visit: Payer: Self-pay

## 2019-11-09 ENCOUNTER — Ambulatory Visit
Admission: RE | Admit: 2019-11-09 | Discharge: 2019-11-09 | Disposition: A | Discharge status: Home / Self Care | Payer: Medicare Other | Source: Ambulatory Visit | Attending: Radiation Oncology | Admitting: Radiation Oncology

## 2019-11-09 DIAGNOSIS — C61 Malignant neoplasm of prostate: Secondary | ICD-10-CM | POA: Diagnosis not present

## 2019-11-09 MED FILL — VIT D2 1.25 MG (50,000 UNIT: 1.25 MG | 35 days supply | Qty: 5 | Fill #1

## 2019-11-10 ENCOUNTER — Ambulatory Visit
Admission: RE | Admit: 2019-11-10 | Discharge: 2019-11-10 | Disposition: A | Discharge status: Home / Self Care | Payer: Medicare Other | Source: Ambulatory Visit | Attending: Radiation Oncology | Admitting: Radiation Oncology

## 2019-11-10 ENCOUNTER — Other Ambulatory Visit: Payer: Self-pay

## 2019-11-10 DIAGNOSIS — C61 Malignant neoplasm of prostate: Secondary | ICD-10-CM | POA: Diagnosis not present

## 2019-11-11 ENCOUNTER — Other Ambulatory Visit: Payer: Self-pay

## 2019-11-11 ENCOUNTER — Ambulatory Visit
Admission: RE | Admit: 2019-11-11 | Discharge: 2019-11-11 | Disposition: A | Discharge status: Home / Self Care | Payer: Medicare Other | Source: Ambulatory Visit | Attending: Radiation Oncology | Admitting: Radiation Oncology

## 2019-11-11 DIAGNOSIS — C61 Malignant neoplasm of prostate: Secondary | ICD-10-CM | POA: Diagnosis not present

## 2019-11-12 ENCOUNTER — Other Ambulatory Visit: Payer: Self-pay

## 2019-11-12 ENCOUNTER — Ambulatory Visit
Admission: RE | Admit: 2019-11-12 | Discharge: 2019-11-12 | Disposition: A | Discharge status: Home / Self Care | Payer: Medicare Other | Source: Ambulatory Visit | Attending: Radiation Oncology | Admitting: Radiation Oncology

## 2019-11-12 DIAGNOSIS — C61 Malignant neoplasm of prostate: Secondary | ICD-10-CM | POA: Diagnosis not present

## 2019-11-15 ENCOUNTER — Other Ambulatory Visit: Payer: Self-pay

## 2019-11-15 ENCOUNTER — Ambulatory Visit
Admission: RE | Admit: 2019-11-15 | Discharge: 2019-11-15 | Disposition: A | Discharge status: Home / Self Care | Payer: Medicare Other | Source: Ambulatory Visit | Attending: Radiation Oncology | Admitting: Radiation Oncology

## 2019-11-15 DIAGNOSIS — C61 Malignant neoplasm of prostate: Secondary | ICD-10-CM | POA: Diagnosis not present

## 2019-11-16 ENCOUNTER — Other Ambulatory Visit: Payer: Self-pay

## 2019-11-16 ENCOUNTER — Ambulatory Visit
Admission: RE | Admit: 2019-11-16 | Discharge: 2019-11-16 | Disposition: A | Discharge status: Home / Self Care | Payer: Medicare Other | Source: Ambulatory Visit | Attending: Radiation Oncology | Admitting: Radiation Oncology

## 2019-11-16 DIAGNOSIS — C61 Malignant neoplasm of prostate: Secondary | ICD-10-CM | POA: Diagnosis not present

## 2019-11-17 ENCOUNTER — Other Ambulatory Visit: Payer: Self-pay

## 2019-11-17 ENCOUNTER — Ambulatory Visit
Admission: RE | Admit: 2019-11-17 | Discharge: 2019-11-17 | Disposition: A | Discharge status: Home / Self Care | Payer: Medicare Other | Source: Ambulatory Visit | Attending: Radiation Oncology | Admitting: Radiation Oncology

## 2019-11-17 DIAGNOSIS — C61 Malignant neoplasm of prostate: Secondary | ICD-10-CM | POA: Diagnosis not present

## 2019-11-18 ENCOUNTER — Ambulatory Visit
Admission: RE | Admit: 2019-11-18 | Discharge: 2019-11-18 | Disposition: A | Discharge status: Home / Self Care | Payer: Medicare Other | Source: Ambulatory Visit | Attending: Radiation Oncology | Admitting: Radiation Oncology

## 2019-11-18 ENCOUNTER — Other Ambulatory Visit: Payer: Self-pay

## 2019-11-18 DIAGNOSIS — C61 Malignant neoplasm of prostate: Secondary | ICD-10-CM | POA: Diagnosis not present

## 2019-11-19 ENCOUNTER — Other Ambulatory Visit: Payer: Self-pay

## 2019-11-19 ENCOUNTER — Ambulatory Visit
Admission: RE | Admit: 2019-11-19 | Discharge: 2019-11-19 | Disposition: A | Discharge status: Home / Self Care | Payer: Medicare Other | Source: Ambulatory Visit | Attending: Radiation Oncology | Admitting: Radiation Oncology

## 2019-11-19 DIAGNOSIS — C61 Malignant neoplasm of prostate: Secondary | ICD-10-CM | POA: Diagnosis not present

## 2019-11-22 ENCOUNTER — Ambulatory Visit
Admission: RE | Admit: 2019-11-22 | Discharge: 2019-11-22 | Disposition: A | Discharge status: Home / Self Care | Payer: Medicare Other | Source: Ambulatory Visit | Attending: Radiation Oncology | Admitting: Radiation Oncology

## 2019-11-22 ENCOUNTER — Other Ambulatory Visit: Payer: Self-pay

## 2019-11-22 DIAGNOSIS — C61 Malignant neoplasm of prostate: Secondary | ICD-10-CM | POA: Diagnosis not present

## 2019-11-23 ENCOUNTER — Other Ambulatory Visit: Payer: Self-pay

## 2019-11-23 ENCOUNTER — Ambulatory Visit
Admission: RE | Admit: 2019-11-23 | Discharge: 2019-11-23 | Disposition: A | Discharge status: Home / Self Care | Payer: Medicare Other | Source: Ambulatory Visit | Attending: Radiation Oncology | Admitting: Radiation Oncology

## 2019-11-23 DIAGNOSIS — C61 Malignant neoplasm of prostate: Secondary | ICD-10-CM | POA: Diagnosis not present

## 2019-11-24 ENCOUNTER — Other Ambulatory Visit: Payer: Self-pay

## 2019-11-24 ENCOUNTER — Ambulatory Visit
Admission: RE | Admit: 2019-11-24 | Discharge: 2019-11-24 | Disposition: A | Discharge status: Home / Self Care | Payer: Medicare Other | Source: Ambulatory Visit | Attending: Radiation Oncology | Admitting: Radiation Oncology

## 2019-11-24 DIAGNOSIS — C61 Malignant neoplasm of prostate: Secondary | ICD-10-CM | POA: Diagnosis not present

## 2019-11-25 ENCOUNTER — Ambulatory Visit
Admission: RE | Admit: 2019-11-25 | Discharge: 2019-11-25 | Disposition: A | Discharge status: Home / Self Care | Payer: Medicare Other | Source: Ambulatory Visit | Attending: Radiation Oncology | Admitting: Radiation Oncology

## 2019-11-25 DIAGNOSIS — C61 Malignant neoplasm of prostate: Secondary | ICD-10-CM | POA: Diagnosis not present

## 2019-11-26 ENCOUNTER — Ambulatory Visit
Admission: RE | Admit: 2019-11-26 | Discharge: 2019-11-26 | Disposition: A | Discharge status: Home / Self Care | Payer: Medicare Other | Source: Ambulatory Visit | Attending: Radiation Oncology | Admitting: Radiation Oncology

## 2019-11-26 ENCOUNTER — Other Ambulatory Visit: Payer: Self-pay

## 2019-11-26 DIAGNOSIS — C61 Malignant neoplasm of prostate: Secondary | ICD-10-CM | POA: Diagnosis not present

## 2019-11-30 ENCOUNTER — Other Ambulatory Visit: Payer: Self-pay

## 2019-11-30 ENCOUNTER — Ambulatory Visit
Admission: RE | Admit: 2019-11-30 | Discharge: 2019-11-30 | Disposition: A | Discharge status: Home / Self Care | Payer: Medicare Other | Source: Ambulatory Visit | Attending: Radiation Oncology | Admitting: Radiation Oncology

## 2019-11-30 DIAGNOSIS — C61 Malignant neoplasm of prostate: Secondary | ICD-10-CM | POA: Diagnosis not present

## 2019-12-01 ENCOUNTER — Ambulatory Visit
Admission: RE | Admit: 2019-12-01 | Discharge: 2019-12-01 | Disposition: A | Discharge status: Home / Self Care | Payer: Medicare Other | Source: Ambulatory Visit | Attending: Radiation Oncology | Admitting: Radiation Oncology

## 2019-12-01 ENCOUNTER — Other Ambulatory Visit: Payer: Self-pay

## 2019-12-01 DIAGNOSIS — C61 Malignant neoplasm of prostate: Secondary | ICD-10-CM | POA: Diagnosis not present

## 2019-12-02 ENCOUNTER — Ambulatory Visit
Admission: RE | Admit: 2019-12-02 | Discharge: 2019-12-02 | Disposition: A | Discharge status: Home / Self Care | Payer: Medicare Other | Source: Ambulatory Visit | Attending: Radiation Oncology | Admitting: Radiation Oncology

## 2019-12-02 ENCOUNTER — Other Ambulatory Visit: Payer: Self-pay

## 2019-12-02 DIAGNOSIS — C61 Malignant neoplasm of prostate: Secondary | ICD-10-CM | POA: Diagnosis not present

## 2019-12-03 ENCOUNTER — Other Ambulatory Visit: Payer: Self-pay

## 2019-12-03 ENCOUNTER — Ambulatory Visit
Admission: RE | Admit: 2019-12-03 | Discharge: 2019-12-03 | Disposition: A | Discharge status: Home / Self Care | Payer: Medicare Other | Source: Ambulatory Visit | Attending: Radiation Oncology | Admitting: Radiation Oncology

## 2019-12-03 DIAGNOSIS — C61 Malignant neoplasm of prostate: Secondary | ICD-10-CM | POA: Diagnosis not present

## 2019-12-04 ENCOUNTER — Ambulatory Visit: Payer: Medicare Other

## 2019-12-05 ENCOUNTER — Ambulatory Visit: Payer: Medicare Other

## 2019-12-06 ENCOUNTER — Ambulatory Visit
Admission: RE | Admit: 2019-12-06 | Discharge: 2019-12-06 | Disposition: A | Discharge status: Home / Self Care | Payer: Medicare Other | Source: Ambulatory Visit | Attending: Radiation Oncology | Admitting: Radiation Oncology

## 2019-12-06 ENCOUNTER — Other Ambulatory Visit: Payer: Self-pay

## 2019-12-06 DIAGNOSIS — C61 Malignant neoplasm of prostate: Secondary | ICD-10-CM | POA: Diagnosis not present

## 2019-12-07 ENCOUNTER — Other Ambulatory Visit: Payer: Self-pay

## 2019-12-07 ENCOUNTER — Ambulatory Visit
Admission: RE | Admit: 2019-12-07 | Discharge: 2019-12-07 | Disposition: A | Discharge status: Home / Self Care | Payer: Medicare Other | Source: Ambulatory Visit | Attending: Radiation Oncology | Admitting: Radiation Oncology

## 2019-12-07 DIAGNOSIS — C61 Malignant neoplasm of prostate: Secondary | ICD-10-CM | POA: Diagnosis not present

## 2019-12-08 ENCOUNTER — Other Ambulatory Visit: Payer: Self-pay

## 2019-12-08 ENCOUNTER — Ambulatory Visit
Admission: RE | Admit: 2019-12-08 | Discharge: 2019-12-08 | Disposition: A | Discharge status: Home / Self Care | Payer: Medicare Other | Source: Ambulatory Visit | Attending: Radiation Oncology | Admitting: Radiation Oncology

## 2019-12-08 DIAGNOSIS — C61 Malignant neoplasm of prostate: Secondary | ICD-10-CM | POA: Diagnosis not present

## 2019-12-09 ENCOUNTER — Other Ambulatory Visit: Payer: Self-pay

## 2019-12-09 ENCOUNTER — Ambulatory Visit
Admission: RE | Admit: 2019-12-09 | Discharge: 2019-12-09 | Disposition: A | Discharge status: Home / Self Care | Payer: Medicare Other | Source: Ambulatory Visit | Attending: Radiation Oncology | Admitting: Radiation Oncology

## 2019-12-09 DIAGNOSIS — C61 Malignant neoplasm of prostate: Secondary | ICD-10-CM | POA: Diagnosis not present

## 2019-12-10 ENCOUNTER — Encounter: Payer: Self-pay | Admitting: Radiation Oncology

## 2019-12-10 ENCOUNTER — Other Ambulatory Visit: Payer: Self-pay

## 2019-12-10 ENCOUNTER — Encounter: Payer: Self-pay | Admitting: Medical Oncology

## 2019-12-10 ENCOUNTER — Ambulatory Visit
Admission: RE | Admit: 2019-12-10 | Discharge: 2019-12-10 | Disposition: A | Discharge status: Home / Self Care | Payer: Medicare Other | Source: Ambulatory Visit | Attending: Radiation Oncology | Admitting: Radiation Oncology

## 2019-12-10 DIAGNOSIS — C61 Malignant neoplasm of prostate: Secondary | ICD-10-CM | POA: Diagnosis not present

## 2019-12-10 NOTE — Progress Notes (Signed)
Congratulated Gregory Perry on completion of radiation. He states he has done well and happy to be finished. He has a telephone follow up with [place], [place] 8/19. He currently does not have a follow up with Dr.Eskridge. He will need to call to get an appointment. I asked him to call me if I can be of assistance in the future.

## 2019-12-17 DIAGNOSIS — H401222 Low-tension glaucoma, left eye, moderate stage: Secondary | ICD-10-CM | POA: Diagnosis not present

## 2019-12-17 DIAGNOSIS — H401213 Low-tension glaucoma, right eye, severe stage: Secondary | ICD-10-CM | POA: Diagnosis not present

## 2019-12-17 DIAGNOSIS — H53461 Homonymous bilateral field defects, right side: Secondary | ICD-10-CM | POA: Diagnosis not present

## 2019-12-24 ENCOUNTER — Other Ambulatory Visit: Payer: Self-pay

## 2019-12-24 NOTE — Patient Outreach (Signed)
[facility]) Care Management  12/24/2019  Gregory Perry 1951/05/02 [id number]  First telephone outreach attempt to obtain mRs. Patient was unavailable. Left message for returned call with brother [name].  Ina Homes [facility] Management Assistant [phone]

## 2020-01-04 ENCOUNTER — Other Ambulatory Visit: Payer: Self-pay

## 2020-01-04 NOTE — Patient Outreach (Signed)
[facility]) Care Management  01/04/2020  SIVAN QUAST [birth year] [phone]  Telephone outreach to patient to obtain mRS was successfully completed. MRS=1  Ina Homes [facility] Management Assistant [phone]

## 2020-01-11 ENCOUNTER — Telehealth: Payer: Self-pay

## 2020-01-11 NOTE — Telephone Encounter (Signed)
Left voicemail message in regards to telephone appointment with Freeman Caldron PA on 01/13/20 at 2:30pm. Called to review meaningful use, AUA and prostate questions.

## 2020-01-12 ENCOUNTER — Telehealth: Payer: Self-pay | Admitting: *Deleted

## 2020-01-12 NOTE — Telephone Encounter (Signed)
CALLED PATIENT TO ASK ABOUT RESCHEDULING TELEPHONE FU TO 01-14-20 @ 3:30 PM PER ASHLYN BRUNING REQUEST, SPOKE WITH PATIENT AND HE AGREED TO THIS

## 2020-01-13 ENCOUNTER — Ambulatory Visit: Admission: RE | Admit: 2020-01-13 | Payer: Medicare Other | Source: Ambulatory Visit | Admitting: Urology

## 2020-01-13 ENCOUNTER — Encounter: Payer: Self-pay | Admitting: Urology

## 2020-01-13 ENCOUNTER — Other Ambulatory Visit: Payer: Self-pay

## 2020-01-13 NOTE — Progress Notes (Signed)
  Radiation Oncology         (336) [phone] ________________________________  Name: Gregory Perry MRN: [id number]  Date: 12/10/2019  DOB: [birth year]  End of Treatment Note  Diagnosis:   69 y.o. gentleman with Stage T1c adenocarcinoma of the prostate with Gleason score of 4+3, and PSA of 10.5.     Indication for treatment:  Curative, Definitive Radiotherapy       Radiation treatment dates:   11/02/19 - 12/10/19  Site/dose:   The prostate was treated to 70 Gy in 28 fractions of 2.5 Gy  Beams/energy:   The patient was treated with IMRT using volumetric arc therapy delivering 6 MV X-rays to clockwise and counterclockwise circumferential arcs with a 90 degree collimator offset to avoid dose scalloping.  Image guidance was performed with daily cone beam CT prior to each fraction to align to gold markers in the prostate and assure proper bladder and rectal fill volumes.  Immobilization was achieved with BodyFix custom mold.  Narrative: The patient tolerated radiation treatment relatively well with only minor urinary irritation and modest fatigue.  He reported mild dysuria, nocturia 3-4 times per night, intermittency, hesitancy, straining to void and feelings of incomplete emptying.  He also noted occasional diarrhea but did not require medication or treatment.  He denied gross hematuria or incontinence.  Plan: The patient has completed radiation treatment. He will return to radiation oncology clinic for routine followup in one month. I advised him to call or return sooner if he has any questions or concerns related to his recovery or treatment. ________________________________  Sheral Apley. Tammi Klippel, M.D.

## 2020-01-14 ENCOUNTER — Ambulatory Visit
Admission: RE | Admit: 2020-01-14 | Discharge: 2020-01-14 | Disposition: A | Discharge status: Home / Self Care | Payer: Medicare Other | Source: Ambulatory Visit | Attending: Urology | Admitting: Urology

## 2020-01-14 ENCOUNTER — Telehealth: Payer: Self-pay | Admitting: Urology

## 2020-01-14 DIAGNOSIS — C61 Malignant neoplasm of prostate: Secondary | ICD-10-CM

## 2020-01-14 NOTE — Telephone Encounter (Signed)
Attempted to reach patient for 1 month follow up visit but patient not available.  Brother, Trenton Gammon, was going to call patient and ask him to call me back. I will await his return call.  Nicholos Johns, MMS, PA-C [place] [facility] [place]: [phone]  Fax: [phone]

## 2020-09-04 ENCOUNTER — Other Ambulatory Visit: Payer: Self-pay | Admitting: Family Medicine

## 2020-10-11 ENCOUNTER — Telehealth: Payer: Self-pay

## 2020-10-11 ENCOUNTER — Other Ambulatory Visit: Payer: Self-pay

## 2020-10-11 ENCOUNTER — Other Ambulatory Visit: Payer: Self-pay | Admitting: Family Medicine

## 2020-10-11 MED ORDER — LISINOPRIL 40 MG PO TABS
40.0000 mg | ORAL_TABLET | Freq: Every day | ORAL | 5 refills | Status: DC
  Filled 2020-10-11: qty 30, 30d supply, fill #0

## 2020-10-11 MED ORDER — PANTOPRAZOLE SODIUM 40 MG PO TBEC
40.0000 mg | DELAYED_RELEASE_TABLET | Freq: Every day | ORAL | 2 refills | Status: DC
  Filled 2020-10-11: qty 30, 30d supply, fill #0

## 2020-10-11 MED ORDER — METOPROLOL TARTRATE 25 MG PO TABS
25.0000 mg | ORAL_TABLET | Freq: Two times a day (BID) | ORAL | 5 refills | Status: DC
  Filled 2020-10-11: qty 60, 30d supply, fill #0

## 2020-10-11 NOTE — Telephone Encounter (Signed)
Med refill  Metoprolol Lisinopril Pantoprazar  Fax to [phone]

## 2020-10-11 NOTE — Progress Notes (Signed)
Meds ordered this encounter  Medications  . metoprolol tartrate (LOPRESSOR) 25 MG tablet    Sig: Take 1 tablet (25 mg total) by mouth 2 (two) times daily.    Dispense:  60 tablet    Refill:  5    Needs appointment    Order Specific Question:   Supervising Provider    Answer:   Tresa Garter [id number]  . pantoprazole (PROTONIX) 40 MG tablet    Sig: Take 1 tablet (40 mg total) by mouth daily.    Dispense:  30 tablet    Refill:  2    Order Specific Question:   Supervising Provider    Answer:   Tresa Garter [id number]  . lisinopril (ZESTRIL) 40 MG tablet    Sig: Take 1 tablet (40 mg total) by mouth daily.    Dispense:  30 tablet    Refill:  5    Order Specific Question:   Supervising Provider    Answer:   Tresa Garter [[id number]]    Donia Pounds  APRN, MSN, FNP-C [facility] [address] [place], [place] [address] [phone]

## 2020-10-18 ENCOUNTER — Other Ambulatory Visit: Payer: Self-pay

## 2020-11-02 ENCOUNTER — Telehealth: Payer: Self-pay | Admitting: Nurse Practitioner

## 2020-11-02 NOTE — Telephone Encounter (Signed)
Pt was called VM was left and I also left message with Brother

## 2021-01-15 ENCOUNTER — Telehealth: Payer: Self-pay

## 2021-01-15 NOTE — Telephone Encounter (Signed)
BP med refill  [facility] on [address]

## 2021-01-16 MED ORDER — LISINOPRIL 40 MG PO TABS: 40.0000 mg | ORAL_TABLET | Freq: Every day | ORAL | 5 refills | Status: DC

## 2021-01-16 MED ORDER — METOPROLOL TARTRATE 25 MG PO TABS: 25.0000 mg | ORAL_TABLET | Freq: Two times a day (BID) | ORAL | 5 refills | Status: DC

## 2021-01-16 NOTE — Addendum Note (Signed)
Addended by: Thressa Sheller on: 01/16/2021 08:22 AM   Modules accepted: Orders

## 2021-01-16 NOTE — Telephone Encounter (Signed)
Meds sent

## 2021-05-14 ENCOUNTER — Encounter: Payer: Self-pay | Admitting: Nurse Practitioner

## 2021-05-14 ENCOUNTER — Ambulatory Visit (INDEPENDENT_AMBULATORY_CARE_PROVIDER_SITE_OTHER): Payer: Medicare Other | Admitting: Nurse Practitioner

## 2021-05-14 ENCOUNTER — Other Ambulatory Visit: Payer: Self-pay

## 2021-05-14 VITALS — BP 152/98 | HR 68 | Temp 97.1°F | Ht 71.0 in | Wt 178.0 lb

## 2021-05-14 DIAGNOSIS — K5900 Constipation, unspecified: Secondary | ICD-10-CM | POA: Diagnosis not present

## 2021-05-14 DIAGNOSIS — Z0000 Encounter for general adult medical examination without abnormal findings: Secondary | ICD-10-CM | POA: Diagnosis not present

## 2021-05-14 DIAGNOSIS — I10 Essential (primary) hypertension: Secondary | ICD-10-CM | POA: Diagnosis not present

## 2021-05-14 DIAGNOSIS — I480 Paroxysmal atrial fibrillation: Secondary | ICD-10-CM

## 2021-05-14 LAB — POCT GLYCOSYLATED HEMOGLOBIN (HGB A1C)
HbA1c POC (<> result, manual entry): 4.9 % (ref 4.0–5.6)
HbA1c, POC (controlled diabetic range): 4.9 % (ref 0.0–7.0)
HbA1c, POC (prediabetic range): 4.9 % — AB (ref 5.7–6.4)
Hemoglobin A1C: 4.9 % (ref 4.0–5.6)

## 2021-05-14 MED ORDER — AMLODIPINE BESYLATE 10 MG PO TABS: 10.0000 mg | ORAL_TABLET | Freq: Every day | ORAL | 2 refills | Status: DC

## 2021-05-14 MED ORDER — APIXABAN 5 MG PO TABS
5.0000 mg | ORAL_TABLET | Freq: Two times a day (BID) | ORAL | 6 refills | Status: DC
Start: 2021-05-14 — End: 2021-12-20

## 2021-05-14 MED ORDER — POLYETHYLENE GLYCOL 3350 17 G PO PACK: 17.0000 g | PACK | Freq: Every day | ORAL | 6 refills | Status: DC | PRN

## 2021-05-14 NOTE — Patient Instructions (Signed)
You were seen today in the [facility] for reevaluation of chronic illness. Labs were collected, results will be available via MyChart or, if abnormal, you will be contacted by clinic staff. You were prescribed medications, please take as directed. Please follow up in 3 mths for reevaluation of B/P.

## 2021-05-14 NOTE — Progress Notes (Signed)
[address], [place]  [address] Phone:  [phone]   Fax:  [phone] Subjective:   Patient ID: Gregory Perry, male    DOB: [birth year], 70 y.o.   MRN: [id number]  Chief Complaint  Patient presents with   Follow-up    Pt wanted to know the status of his blood pressure no other issues or concerns   HPI Gregory Perry 70 y.o. male  has a past medical history of Alcohol abuse, Candida esophagitis ([place]), Cardiomyopathy ([place]), CVA (cerebral infarction), Duodenal ulcer, Dysphagia, Headache(784.0), Hypertension, Prostate cancer ([place]) (12/2018), Severe protein-calorie malnutrition ([place]), Stroke ([place]) (09/25/2019), and Vitamin D deficiency (09/2019). To the [facility] for annual wellness exam.  Patient states that he has only been taking three medications regularly, the other listed medications in his chart he was unaware that they were prescribed. Wanted to have B/P checked today, concerned that it may be elevated. Denies any other complaints today.  States that he generally eats healthy, but does not exercise regularly. Currently unemployed and resides with siblings. Smokes 0.25 PDD, but hoping to quit in the near future. Denies any alcohol usage, states that last consumption 4 yrs ago. Denies any problems with acid reflux. Denies any acute changes in vision, states that he has chronic blurred vision. Denies any abdominal/ GI concerns, endorses having 1 BM per week.   Denies any fatigue, chest pain, shortness of breath, HA or dizziness. Denies any blurred vision, numbness or tingling.  Past Medical History:  Diagnosis Date   Alcohol abuse    Candida esophagitis (HCC)    Cardiomyopathy ([place])    CVA (cerebral infarction)    Duodenal ulcer    Dysphagia    Headache(784.0)    history of   Hypertension    Prostate cancer ([place]) 12/2018   Severe protein-calorie malnutrition ([place])    Stroke ([place]) 09/25/2019   Vitamin D deficiency 09/2019    Past  Surgical History:  Procedure Laterality Date   BALLOON DILATION N/A 11/22/2015   Procedure: BALLOON DILATION;  Surgeon: Mauri Pole, MD;  Location: [place];  Service: Endoscopy;  Laterality: N/A;   CARDIAC CATHETERIZATION N/A 01/17/2016   Procedure: Left Heart Cath and Coronary Angiography;  Surgeon: Sherren Mocha, MD;  Location: [place] [facility];  Service: Cardiovascular;  Laterality: N/A;   DIRECT LARYNGOSCOPY  05/07/2011   Procedure: DIRECT LARYNGOSCOPY;  Surgeon: Rozetta Nunnery, MD;  Location: [place];  Service: ENT;  Laterality: N/A;  ESOPHAGOSCOPY  WITH DILATION   EP IMPLANTABLE DEVICE N/A 11/17/2015   Procedure: Loop Recorder Insertion;  Surgeon: Evans Lance, MD;  Location: [place] [facility];  Service: Cardiovascular;  Laterality: N/A;   ESOPHAGOGASTRODUODENOSCOPY  04/29/2011   Procedure: ESOPHAGOGASTRODUODENOSCOPY (EGD);  Surgeon: Winfield Cunas., MD;  Location: [facility];  Service: Endoscopy;  Laterality: N/A;   ESOPHAGOGASTRODUODENOSCOPY N/A 11/22/2015   Procedure: ESOPHAGOGASTRODUODENOSCOPY (EGD);  Surgeon: Mauri Pole, MD;  Location: [facility]OSCOPY;  Service: Endoscopy;  Laterality: N/A;   IR CT HEAD LTD  09/26/2019   IR PERCUTANEOUS ART THROMBECTOMY/INFUSION INTRACRANIAL INC DIAG ANGIO  09/26/2019   IR US GUIDE VASC ACCESS RIGHT  09/26/2019   LOOP RECORDER IMPLANT     RADIOLOGY WITH ANESTHESIA N/A 09/26/2019   Procedure: IR WITH ANESTHESIA;  Surgeon: Luanne Bras, MD;  Location: [place];  Service: Radiology;  Laterality: N/A;    Family History  Problem Relation Age of Onset   Heart attack Maternal Grandfather  Colon cancer Neg Hx    Breast cancer Neg Hx    Prostate cancer Neg Hx     Social History   Socioeconomic History   Marital status: Legally Separated    Spouse name: Not on file   Number of children: 1   Years of education: Not on file   Highest education level: Not on file  Occupational History    Comment: retired  Tobacco Use    Smoking status: Every Day    Packs/day: 0.50    Years: 30.00    Pack years: 15.00    Types: Cigarettes   Smokeless tobacco: Never   Tobacco comments:    Pt smokes 6 to 7 a day trying to quit  Vaping Use   Vaping Use: Never used  Substance and Sexual Activity   Alcohol use: No    Alcohol/week: 3.0 standard drinks    Types: 3 Shots of liquor per week    Comment: quit in 10/2015    Drug use: Yes    Types: Marijuana    Comment: occ   Sexual activity: Not Currently  Other Topics Concern   Not on file  Social History Narrative   Not on file   Social Determinants of Health   Financial Resource Strain: Not on file  Food Insecurity: Not on file  Transportation Needs: Not on file  Physical Activity: Not on file  Stress: Not on file  Social Connections: Not on file  Intimate Partner Violence: Not on file    Outpatient Medications Prior to Visit  Medication Sig Dispense Refill   Blood Pressure Monitoring (BLOOD PRESSURE CUFF) MISC Check blood pressure daily between 8 pm -9 pm 1 each 0   ferrous sulfate 325 (65 FE) MG tablet Take 1 tablet (325 mg total) by mouth 3 (three) times daily with meals. 90 tablet 6   latanoprost (XALATAN) 0.005 % ophthalmic solution      lisinopril (ZESTRIL) 40 MG tablet Take 1 tablet (40 mg total) by mouth daily. 30 tablet 5   metoprolol tartrate (LOPRESSOR) 25 MG tablet Take 1 tablet (25 mg total) by mouth 2 (two) times daily. 60 tablet 5   polyethylene glycol (MIRALAX / GLYCOLAX) 17 g packet Take 17 g by mouth daily as needed for moderate constipation. 14 each 6   atorvastatin (LIPITOR) 40 MG tablet Take 1 tablet (40 mg total) by mouth daily. (Patient not taking: Reported on 05/14/2021) 30 tablet 6   docusate sodium (COLACE) 100 MG capsule Take 1 capsule (100 mg total) by mouth 2 (two) times daily as needed for mild constipation. (Patient not taking: Reported on 05/14/2021) 30 capsule 6   feeding supplement, ENSURE ENLIVE, (ENSURE ENLIVE) LIQD Take 237 mLs  by mouth 2 (two) times daily between meals. (Patient not taking: Reported on 01/13/2020) 237 mL 12   pantoprazole (PROTONIX) 40 MG tablet Take 1 tablet (40 mg total) by mouth daily. (Patient not taking: Reported on 05/14/2021) 30 tablet 2   spironolactone (ALDACTONE) 25 MG tablet Take 1 tablet (25 mg total) by mouth daily. (Patient not taking: Reported on 05/14/2021) 30 tablet 6   Vitamin D, Ergocalciferol, (DRISDOL) 1.25 MG (50000 UNIT) CAPS capsule Take 1 capsule (50,000 Units total) by mouth every 7 (seven) days. (Patient not taking: Reported on 05/14/2021) 5 capsule 6   apixaban (ELIQUIS) 5 MG TABS tablet Take 1 tablet (5 mg total) by mouth 2 (two) times daily. (Patient not taking: Reported on 05/14/2021) 60 tablet 6   No facility-administered  medications prior to visit.    No Known Allergies  Review of Systems  Constitutional:  Negative for chills, fever and malaise/fatigue.  HENT: Negative.    Eyes:  Positive for blurred vision.  Respiratory:  Negative for cough and shortness of breath.   Cardiovascular:  Negative for chest pain, palpitations and leg swelling.  Gastrointestinal:  Positive for constipation. Negative for abdominal pain, blood in stool, diarrhea, nausea and vomiting.  Genitourinary: Negative.   Musculoskeletal: Negative.   Skin: Negative.   Neurological: Negative.   Psychiatric/Behavioral:  Negative for depression. The patient is not nervous/anxious.   All other systems reviewed and are negative.     Objective:    Physical Exam Vitals reviewed.  Constitutional:      General: He is not in acute distress.    Appearance: Normal appearance. He is normal weight.  HENT:     Head: Normocephalic.     Right Ear: Tympanic membrane, ear canal and external ear normal.     Left Ear: Tympanic membrane, ear canal and external ear normal.     Nose: Nose normal.     Mouth/Throat:     Mouth: Mucous membranes are moist.     Pharynx: Oropharynx is clear.  Eyes:      Extraocular Movements: Extraocular movements intact.     Conjunctiva/sclera: Conjunctivae normal.     Pupils: Pupils are equal, round, and reactive to light.  Neck:     Vascular: No carotid bruit.  Cardiovascular:     Rate and Rhythm: Normal rate and regular rhythm.     Pulses: Normal pulses.     Heart sounds: Normal heart sounds.     Comments: No obvious peripheral edema Pulmonary:     Effort: Pulmonary effort is normal.     Breath sounds: Normal breath sounds.  Abdominal:     General: Abdomen is flat. Bowel sounds are normal.     Palpations: Abdomen is soft.  Musculoskeletal:        General: No swelling, tenderness, deformity or signs of injury. Normal range of motion.     Cervical back: Normal range of motion and neck supple. No rigidity or tenderness.     Right lower leg: No edema.     Left lower leg: No edema.  Lymphadenopathy:     Cervical: No cervical adenopathy.  Skin:    General: Skin is warm and dry.     Capillary Refill: Capillary refill takes less than 2 seconds.  Neurological:     General: No focal deficit present.     Mental Status: He is alert and oriented to person, place, and time.  Psychiatric:        Mood and Affect: Mood normal.        Behavior: Behavior normal.        Thought Content: Thought content normal.        Judgment: Judgment normal.    BP (!) 152/98    Pulse 68    Temp (!) 97.1 F (36.2 C)    Ht 5\' 11"  (1.803 m)    Wt 178 lb (80.7 kg)    SpO2 100%    BMI 24.83 kg/m  Wt Readings from Last 3 Encounters:  05/14/21 178 lb (80.7 kg)  10/21/19 162 lb 12.8 oz (73.8 kg)  10/14/19 166 lb 9.6 oz (75.6 kg)    Immunization History  Administered Date(s) Administered   Pneumococcal Conjugate-13 11/16/2018   Pneumococcal Polysaccharide-23 09/28/2019   Tdap 11/16/2018    Diabetic  Foot Exam - Simple   No data filed     Lab Results  Component Value Date   TSH 0.460 10/08/2019   Lab Results  Component Value Date   WBC 6.3 10/08/2019   HGB  10.7 (L) 10/08/2019   HCT 35.1 (L) 10/08/2019   MCV 78 (L) 10/08/2019   PLT 359 10/08/2019   Lab Results  Component Value Date   NA 138 09/28/2019   K 3.4 (L) 09/28/2019   CO2 25 09/28/2019   GLUCOSE 89 09/28/2019   BUN 12 09/28/2019   CREATININE 1.06 09/28/2019   BILITOT 0.6 08/03/2018   ALKPHOS 81 08/03/2018   AST 24 08/03/2018   ALT 10 08/03/2018   PROT 7.8 08/03/2018   ALBUMIN 4.2 08/03/2018   CALCIUM 8.9 09/28/2019   ANIONGAP 8 09/28/2019   Lab Results  Component Value Date   CHOL 139 10/08/2019   CHOL 192 09/26/2019   CHOL 154 08/03/2018   Lab Results  Component Value Date   HDL 57 10/08/2019   HDL 54 09/26/2019   HDL 63 08/03/2018   Lab Results  Component Value Date   LDLCALC 71 10/08/2019   LDLCALC 127 (H) 09/26/2019   LDLCALC 81 08/03/2018   Lab Results  Component Value Date   TRIG 49 10/08/2019   TRIG 53 09/26/2019   TRIG 52 09/26/2019   Lab Results  Component Value Date   CHOLHDL 2.4 10/08/2019   CHOLHDL 3.6 09/26/2019   CHOLHDL 2.4 08/03/2018   Lab Results  Component Value Date   HGBA1C 4.9 05/14/2021   HGBA1C 4.9 05/14/2021   HGBA1C 4.9 (A) 05/14/2021   HGBA1C 4.9 05/14/2021       Assessment & Plan:   Problem List Items Addressed This Visit       Cardiovascular and Mediastinum   Essential hypertension   Relevant Medications   amLODipine (NORVASC) 10 MG tablet   apixaban (ELIQUIS) 5 MG TABS tablet   Paroxysmal atrial fibrillation (HCC)   Relevant Medications   amLODipine (NORVASC) 10 MG tablet   apixaban (ELIQUIS) 5 MG TABS tablet   Other Visit Diagnoses     Healthcare maintenance    -  Primary   Relevant Orders   CBC with Differential/Platelet   Comprehensive metabolic panel   Lipid panel   POCT glycosylated hemoglobin (Hb A1C) (Completed) Encouraged continued diet and exercise efforts  Encouraged continued compliance with medication     Constipation, unspecified constipation type       Relevant Medications    polyethylene glycol (MIRALAX / GLYCOLAX) 17 g packet Discussed non pharmacological methods for management of constipation, including diet    Follow up in 3 mths for reevaluation of B/P and constipation, sooner as needed    I am having Yusuf D. Bergerson start on amLODipine. I am also having him maintain his Blood Pressure Cuff, atorvastatin, docusate sodium, feeding supplement, ferrous sulfate, spironolactone, Vitamin D (Ergocalciferol), latanoprost, pantoprazole, lisinopril, metoprolol tartrate, apixaban, and polyethylene glycol.  Meds ordered this encounter  Medications   amLODipine (NORVASC) 10 MG tablet    Sig: Take 1 tablet (10 mg total) by mouth daily.    Dispense:  30 tablet    Refill:  2   apixaban (ELIQUIS) 5 MG TABS tablet    Sig: Take 1 tablet (5 mg total) by mouth 2 (two) times daily.    Dispense:  60 tablet    Refill:  6    Patient will call when he needs this filled.  polyethylene glycol (MIRALAX / GLYCOLAX) 17 g packet    Sig: Take 17 g by mouth daily as needed for moderate constipation.    Dispense:  14 each    Refill:  6     Teena Dunk, NP

## 2021-05-15 ENCOUNTER — Other Ambulatory Visit: Payer: Self-pay | Admitting: Nurse Practitioner

## 2021-05-15 DIAGNOSIS — R17 Unspecified jaundice: Secondary | ICD-10-CM

## 2021-05-15 DIAGNOSIS — R748 Abnormal levels of other serum enzymes: Secondary | ICD-10-CM

## 2021-05-15 DIAGNOSIS — F1010 Alcohol abuse, uncomplicated: Secondary | ICD-10-CM

## 2021-05-15 LAB — COMPREHENSIVE METABOLIC PANEL
ALT: 359 IU/L — ABNORMAL HIGH (ref 0–44)
AST: 254 IU/L — ABNORMAL HIGH (ref 0–40)
Albumin/Globulin Ratio: 1.5 (ref 1.2–2.2)
Albumin: 3.9 g/dL (ref 3.8–4.8)
Alkaline Phosphatase: 127 IU/L — ABNORMAL HIGH (ref 44–121)
BUN/Creatinine Ratio: 19 (ref 10–24)
BUN: 20 mg/dL (ref 8–27)
Bilirubin Total: 1.5 mg/dL — ABNORMAL HIGH (ref 0.0–1.2)
CO2: 22 mmol/L (ref 20–29)
Calcium: 9.7 mg/dL (ref 8.6–10.2)
Chloride: 101 mmol/L (ref 96–106)
Creatinine, Ser: 1.05 mg/dL (ref 0.76–1.27)
Globulin, Total: 2.6 g/dL (ref 1.5–4.5)
Glucose: 100 mg/dL — ABNORMAL HIGH (ref 70–99)
Potassium: 4.5 mmol/L (ref 3.5–5.2)
Sodium: 142 mmol/L (ref 134–144)
Total Protein: 6.5 g/dL (ref 6.0–8.5)
eGFR: 77 mL/min/{1.73_m2} (ref >59)

## 2021-05-15 LAB — CBC WITH DIFFERENTIAL/PLATELET
Basophils Absolute: 0 10*3/uL (ref 0.0–0.2)
Basos: 0 %
EOS (ABSOLUTE): 0 10*3/uL (ref 0.0–0.4)
Eos: 1 %
Hematocrit: 40.8 % (ref 37.5–51.0)
Hemoglobin: 13.4 g/dL (ref 13.0–17.7)
Immature Grans (Abs): 0 10*3/uL (ref 0.0–0.1)
Immature Granulocytes: 1 %
Lymphocytes Absolute: 1 10*3/uL (ref 0.7–3.1)
Lymphs: 16 %
MCH: 31.8 pg (ref 26.6–33.0)
MCHC: 32.8 g/dL (ref 31.5–35.7)
MCV: 97 fL (ref 79–97)
Monocytes Absolute: 0.9 10*3/uL (ref 0.1–0.9)
Monocytes: 13 %
NRBC: 1 % — ABNORMAL HIGH (ref 0–0)
Neutrophils Absolute: 4.6 10*3/uL (ref 1.4–7.0)
Neutrophils: 69 %
Platelets: 237 10*3/uL (ref 150–450)
RBC: 4.21 x10E6/uL (ref 4.14–5.80)
RDW: 13.6 % (ref 11.6–15.4)
WBC: 6.6 10*3/uL (ref 3.4–10.8)

## 2021-05-15 LAB — LIPID PANEL
Chol/HDL Ratio: 3.6 ratio (ref 0.0–5.0)
Cholesterol, Total: 160 mg/dL (ref 100–199)
HDL: 45 mg/dL (ref >39)
LDL Chol Calc (NIH): 102 mg/dL — ABNORMAL HIGH (ref 0–99)
Triglycerides: 63 mg/dL (ref 0–149)
VLDL Cholesterol Cal: 13 mg/dL (ref 5–40)

## 2021-06-06 ENCOUNTER — Other Ambulatory Visit: Payer: Self-pay

## 2021-06-06 ENCOUNTER — Ambulatory Visit (HOSPITAL_COMMUNITY)
Admission: RE | Admit: 2021-06-06 | Discharge: 2021-06-06 | Disposition: A | Discharge status: Home / Self Care | Payer: Medicare Other | Source: Ambulatory Visit | Attending: Nurse Practitioner | Admitting: Nurse Practitioner

## 2021-06-06 DIAGNOSIS — F1010 Alcohol abuse, uncomplicated: Secondary | ICD-10-CM | POA: Diagnosis present

## 2021-06-06 DIAGNOSIS — R17 Unspecified jaundice: Secondary | ICD-10-CM | POA: Diagnosis present

## 2021-06-06 DIAGNOSIS — R748 Abnormal levels of other serum enzymes: Secondary | ICD-10-CM | POA: Insufficient documentation

## 2021-07-19 ENCOUNTER — Other Ambulatory Visit: Payer: Self-pay | Admitting: Family Medicine

## 2021-07-24 ENCOUNTER — Other Ambulatory Visit: Payer: Self-pay

## 2021-07-25 ENCOUNTER — Other Ambulatory Visit: Payer: Self-pay

## 2021-07-25 MED ORDER — DOCUSATE SODIUM 100 MG PO CAPS
100.0000 mg | ORAL_CAPSULE | Freq: Two times a day (BID) | ORAL | 6 refills | Status: DC | PRN
  Filled 2021-07-25: qty 30, 15d supply, fill #0

## 2021-07-25 MED ORDER — FERROUS SULFATE 325 (65 FE) MG PO TABS
325.0000 mg | ORAL_TABLET | Freq: Three times a day (TID) | ORAL | 6 refills | Status: DC
  Filled 2021-07-25: qty 90, 30d supply, fill #0

## 2021-07-25 MED ORDER — METOPROLOL TARTRATE 25 MG PO TABS
25.0000 mg | ORAL_TABLET | Freq: Two times a day (BID) | ORAL | 5 refills | Status: DC
  Filled 2021-07-25: qty 60, 30d supply, fill #0

## 2021-08-01 ENCOUNTER — Other Ambulatory Visit: Payer: Self-pay

## 2021-08-13 ENCOUNTER — Ambulatory Visit: Payer: Medicare Other | Admitting: Nurse Practitioner

## 2021-08-14 ENCOUNTER — Other Ambulatory Visit: Payer: Self-pay | Admitting: Nurse Practitioner

## 2021-08-14 DIAGNOSIS — I10 Essential (primary) hypertension: Secondary | ICD-10-CM

## 2021-09-27 ENCOUNTER — Other Ambulatory Visit: Payer: Self-pay

## 2021-10-26 ENCOUNTER — Other Ambulatory Visit: Payer: Self-pay

## 2021-10-26 MED ORDER — FERROUS SULFATE 325 (65 FE) MG PO TABS
325.0000 mg | ORAL_TABLET | Freq: Three times a day (TID) | ORAL | 6 refills | Status: DC
  Filled 2021-10-26: qty 90, 30d supply, fill #0

## 2021-10-29 ENCOUNTER — Other Ambulatory Visit: Payer: Self-pay | Admitting: Nurse Practitioner

## 2021-10-29 DIAGNOSIS — I10 Essential (primary) hypertension: Secondary | ICD-10-CM

## 2021-11-05 ENCOUNTER — Other Ambulatory Visit: Payer: Self-pay

## 2021-11-05 MED ORDER — FERROUS SULFATE 325 (65 FE) MG PO TABS
325.0000 mg | ORAL_TABLET | Freq: Three times a day (TID) | ORAL | 6 refills | Status: DC
  Filled 2021-11-05: qty 90, 30d supply, fill #0

## 2021-12-20 ENCOUNTER — Other Ambulatory Visit: Payer: Self-pay | Admitting: Nurse Practitioner

## 2021-12-20 DIAGNOSIS — I480 Paroxysmal atrial fibrillation: Secondary | ICD-10-CM

## 2022-03-08 ENCOUNTER — Other Ambulatory Visit: Payer: Self-pay

## 2022-03-12 ENCOUNTER — Other Ambulatory Visit: Payer: Self-pay

## 2022-03-12 MED ORDER — FERROUS SULFATE 325 (65 FE) MG PO TABS: 325.0000 mg | ORAL_TABLET | Freq: Three times a day (TID) | ORAL | 0 refills | Status: DC

## 2022-04-13 ENCOUNTER — Other Ambulatory Visit: Payer: Self-pay | Admitting: Nurse Practitioner

## 2022-04-17 ENCOUNTER — Emergency Department (HOSPITAL_COMMUNITY): Payer: Medicare Other

## 2022-04-17 ENCOUNTER — Emergency Department (HOSPITAL_COMMUNITY)
Admission: EM | Admit: 2022-04-17 | Discharge: 2022-04-17 | Disposition: A | Discharge status: Home / Self Care | Payer: Medicare Other | Attending: Emergency Medicine | Admitting: Emergency Medicine

## 2022-04-17 DIAGNOSIS — I509 Heart failure, unspecified: Secondary | ICD-10-CM | POA: Insufficient documentation

## 2022-04-17 DIAGNOSIS — R0602 Shortness of breath: Secondary | ICD-10-CM | POA: Diagnosis present

## 2022-04-17 DIAGNOSIS — Z79899 Other long term (current) drug therapy: Secondary | ICD-10-CM | POA: Diagnosis not present

## 2022-04-17 DIAGNOSIS — Z7982 Long term (current) use of aspirin: Secondary | ICD-10-CM | POA: Insufficient documentation

## 2022-04-17 DIAGNOSIS — R2243 Localized swelling, mass and lump, lower limb, bilateral: Secondary | ICD-10-CM | POA: Diagnosis not present

## 2022-04-17 DIAGNOSIS — I5023 Acute on chronic systolic (congestive) heart failure: Secondary | ICD-10-CM

## 2022-04-17 LAB — COMPREHENSIVE METABOLIC PANEL
ALT: 298 U/L — ABNORMAL HIGH (ref 0–44)
AST: 234 U/L — ABNORMAL HIGH (ref 15–41)
Albumin: 3.1 g/dL — ABNORMAL LOW (ref 3.5–5.0)
Alkaline Phosphatase: 117 U/L (ref 38–126)
Anion gap: 14 (ref 5–15)
BUN: 29 mg/dL — ABNORMAL HIGH (ref 8–23)
CO2: 21 mmol/L — ABNORMAL LOW (ref 22–32)
Calcium: 8.8 mg/dL — ABNORMAL LOW (ref 8.9–10.3)
Chloride: 102 mmol/L (ref 98–111)
Creatinine, Ser: 1.19 mg/dL (ref 0.61–1.24)
GFR, Estimated: >60 mL/min (ref >60)
Glucose, Bld: 123 mg/dL — ABNORMAL HIGH (ref 70–99)
Potassium: 4 mmol/L (ref 3.5–5.1)
Sodium: 137 mmol/L (ref 135–145)
Total Bilirubin: 2.6 mg/dL — ABNORMAL HIGH (ref 0.3–1.2)
Total Protein: 6.6 g/dL (ref 6.5–8.1)

## 2022-04-17 LAB — CBC WITH DIFFERENTIAL/PLATELET
Abs Immature Granulocytes: 0.03 10*3/uL (ref 0.00–0.07)
Basophils Absolute: 0 10*3/uL (ref 0.0–0.1)
Basophils Relative: 0 %
Eosinophils Absolute: 0 10*3/uL (ref 0.0–0.5)
Eosinophils Relative: 0 %
HCT: 37.6 % — ABNORMAL LOW (ref 39.0–52.0)
Hemoglobin: 12.5 g/dL — ABNORMAL LOW (ref 13.0–17.0)
Immature Granulocytes: 0 %
Lymphocytes Relative: 12 %
Lymphs Abs: 0.9 10*3/uL (ref 0.7–4.0)
MCH: 32.6 pg (ref 26.0–34.0)
MCHC: 33.2 g/dL (ref 30.0–36.0)
MCV: 97.9 fL (ref 80.0–100.0)
Monocytes Absolute: 0.6 10*3/uL (ref 0.1–1.0)
Monocytes Relative: 8 %
Neutro Abs: 5.9 10*3/uL (ref 1.7–7.7)
Neutrophils Relative %: 80 %
Platelets: 229 10*3/uL (ref 150–400)
RBC: 3.84 MIL/uL — ABNORMAL LOW (ref 4.22–5.81)
RDW: 16.1 % — ABNORMAL HIGH (ref 11.5–15.5)
WBC: 7.4 10*3/uL (ref 4.0–10.5)
nRBC: 0.8 % — ABNORMAL HIGH (ref 0.0–0.2)

## 2022-04-17 LAB — BRAIN NATRIURETIC PEPTIDE: B Natriuretic Peptide: 3385.5 pg/mL — ABNORMAL HIGH (ref 0.0–100.0)

## 2022-04-17 MED ORDER — FUROSEMIDE 10 MG/ML IJ SOLN
40.0000 mg | Freq: Once | INTRAMUSCULAR | Status: AC
  Administered 2022-04-17: 40 mg via INTRAVENOUS
  Filled 2022-04-17: qty 4

## 2022-04-17 MED ORDER — FUROSEMIDE 20 MG PO TABS: 20.0000 mg | ORAL_TABLET | Freq: Every day | ORAL | 0 refills | Status: DC

## 2022-04-17 NOTE — ED Triage Notes (Addendum)
Patient BIB [facility] from a family member's home for shortness of breath and tingling in his left hand that started at 1030 today. No focal weakness, patient is alert and in no apparent distress at this time.

## 2022-04-17 NOTE — Discharge Instructions (Addendum)
As discussed, you have been provided a new medication.  It is important that you obtain and take this regularly and follow-up with your cardiology clinic.  They will call you for a follow-up visit next week.  Return here for concerning changes in your condition.

## 2022-04-17 NOTE — ED Notes (Signed)
Discharge instructions reviewed with patient and caregiver. Patient and caregiver denies any questions or concerns at this time, voices understanding for need for follow-up care. Patient out to lobby via wheelchair.

## 2022-04-17 NOTE — ED Provider Notes (Signed)
[facility] EMERGENCY DEPARTMENT Provider Note   CSN: [id number] Arrival date & time: 04/17/22  1324     History  Chief Complaint  Patient presents with   Shortness of Breath    Gregory Perry is a 71 y.o. male.  HPI Patient presents initially via EMS, but is accompanied by his sister eventually.  Patient has some cognitive impairment, history is obtained by EMS, and his sister.  Patient himself complains of dyspnea, denies pain, denies fever.  EMS reports no hemodynamic instability in route, no oxygen requirement.    Home Medications Prior to Admission medications   Medication Sig Start Date End Date Taking? Authorizing Provider  furosemide (LASIX) 20 MG tablet Take 1 tablet (20 mg total) by mouth daily. 04/17/22  Yes Carmin Muskrat, MD  amLODipine (NORVASC) 5 MG tablet TAKE 2 TABLETS BY MOUTH EVERY DAY 10/30/21   Passmore, Jake Church I, NP  atorvastatin (LIPITOR) 40 MG tablet Take 1 tablet (40 mg total) by mouth daily. Patient not taking: Reported on 05/14/2021 10/08/19   Azzie Glatter, FNP  Blood Pressure Monitoring (BLOOD PRESSURE CUFF) MISC Check blood pressure daily between 8 pm -9 pm 09/13/16   [name], FNP  docusate sodium (COLACE) 100 MG capsule Take 1 capsule (100 mg total) by mouth 2 (two) times daily as needed for mild constipation. 07/25/21   Bo Merino I, NP  ELIQUIS 5 MG TABS tablet TAKE 1 TABLET BY MOUTH TWICE A DAY 12/20/21   Fenton Foy, NP  feeding supplement, ENSURE ENLIVE, (ENSURE ENLIVE) LIQD Take 237 mLs by mouth 2 (two) times daily between meals. Patient not taking: Reported on 01/13/2020 10/08/19   Azzie Glatter, FNP  ferrous sulfate 325 (65 FE) MG tablet Take 1 tablet (325 mg total) by mouth 3 (three) times daily with meals. 03/12/22   Fenton Foy, NP  latanoprost (XALATAN) 0.005 % ophthalmic solution  12/17/19   [provider]  lisinopril (ZESTRIL) 40 MG tablet TAKE 1 TABLET(40 MG) BY MOUTH DAILY  07/19/21   Dorena Dew, FNP  metoprolol tartrate (LOPRESSOR) 25 MG tablet Take 1 tablet (25 mg total) by mouth 2 (two) times daily. 07/25/21   Passmore, Jake Church I, NP  pantoprazole (PROTONIX) 40 MG tablet Take 1 tablet (40 mg total) by mouth daily. Patient not taking: Reported on 05/14/2021 10/11/20   Dorena Dew, FNP  polyethylene glycol (MIRALAX / GLYCOLAX) 17 g packet Take 17 g by mouth daily as needed for moderate constipation. 05/14/21   Bo Merino I, NP  spironolactone (ALDACTONE) 25 MG tablet Take 1 tablet (25 mg total) by mouth daily. Patient not taking: Reported on 05/14/2021 10/08/19   Azzie Glatter, FNP  Vitamin D, Ergocalciferol, (DRISDOL) 1.25 MG (50000 UNIT) CAPS capsule Take 1 capsule (50,000 Units total) by mouth every 7 (seven) days. Patient not taking: Reported on 05/14/2021 10/11/19   Azzie Glatter, FNP      Allergies    Patient has no known allergies.    Review of Systems   Review of Systems  All other systems reviewed and are negative.   Physical Exam Updated Vital Signs BP (!) 151/115   Pulse 87   Temp (!) 97.5 F (36.4 C)   Resp 18   SpO2 97%  Physical Exam Vitals and nursing note reviewed.  Constitutional:      General: He is not in acute distress.    Appearance: He is well-developed.     Comments: Thin  elderly male withdrawn, but pleasantly interactive.  HENT:     Head: Normocephalic and atraumatic.  Eyes:     Conjunctiva/sclera: Conjunctivae normal.  Cardiovascular:     Rate and Rhythm: Normal rate and regular rhythm.  Pulmonary:     Effort: Pulmonary effort is normal. No respiratory distress.     Breath sounds: No wheezing.  Abdominal:     General: There is no distension.  Musculoskeletal:     Right lower leg: Edema present.     Left lower leg: Edema present.  Skin:    General: Skin is warm and dry.  Neurological:     Mental Status: He is alert and oriented to person, place, and time.  Psychiatric:        Cognition and  Memory: Cognition is impaired. Memory is impaired.     ED Results / Procedures / Treatments   Labs (all labs ordered are listed, but only abnormal results are displayed) Labs Reviewed  BRAIN NATRIURETIC PEPTIDE - Abnormal; Notable for the following components:      Result Value   B Natriuretic Peptide 3,385.5 (*)    All other components within normal limits  COMPREHENSIVE METABOLIC PANEL - Abnormal; Notable for the following components:   CO2 21 (*)    Glucose, Bld 123 (*)    BUN 29 (*)    Calcium 8.8 (*)    Albumin 3.1 (*)    AST 234 (*)    ALT 298 (*)    Total Bilirubin 2.6 (*)    All other components within normal limits  CBC WITH DIFFERENTIAL/PLATELET - Abnormal; Notable for the following components:   RBC 3.84 (*)    Hemoglobin 12.5 (*)    HCT 37.6 (*)    RDW 16.1 (*)    nRBC 0.8 (*)    All other components within normal limits    EKG EKG Interpretation  Date/Time:  Wednesday April 17 2022 13:37:55 EST Ventricular Rate:  95 PR Interval:  144 QRS Duration: 104 QT Interval:  408 QTC Calculation: 512 R Axis:   151 Text Interpretation: Normal sinus rhythm Possible Left atrial enlargement Incomplete right bundle branch block Biventricular hypertrophy Possible Lateral infarct , age undetermined Prolonged QT Abnormal ECG Confirmed by Carmin Muskrat [id number]) on 04/17/2022 2:00:37 PM  Radiology DG Chest Port 1 View  Result Date: 04/17/2022 CLINICAL DATA:  Shortness of breath EXAM: PORTABLE CHEST 1 VIEW COMPARISON:  5/2/21CXR FINDINGS: Loop recorder in place. No pleural effusion. No pneumothorax. Enlarged cardiac contours. No focal airspace opacity. There are bilateral prominent interstitial opacities with relative area of oligemia at the left lung base/costophrenic angle. No displaced rib fractures. Visualized upper abdomen is unremarkable. IMPRESSION: Cardiomegaly with mild pulmonary edema. Electronically Signed   By: Marin Roberts M.D.   On: 04/17/2022 15:22     Procedures Procedures    Medications Ordered in ED Medications  furosemide (LASIX) injection 40 mg (40 mg Intravenous Given 04/17/22 1552)    ED Course/ Medical Decision Making/ A&P                           Medical Decision Making Adult male with multiple medical issues, without full cognitive capacity presents with dyspnea.  Patient is awake and alert, afebrile.  Given his cognitive impairment differential including medication noncompliance, versus pneumonia versus heart failure exacerbation versus sepsis or bacteremia considered.  X-ray labs ordered from triage.  Amount and/or Complexity of Data Reviewed Independent Historian: caregiver and  EMS    Details: History per HPI. External Data Reviewed: notes.    Details: Cardiology notes with pacemaker evaluation notes reviewed.  Echocardiogram from 2 years reviewed, EF 45/50%. Labs: ordered. Decision-making details documented in ED Course.    Details: BNP greater than 3800 Radiology: ordered. Decision-making details documented in ED Course.    Details: Pulmonary congestion ECG/medicine tests:  Decision-making details documented in ED Course.  Risk Prescription drug management. Decision regarding hospitalization.  On repeat exam the patient is accompanied by his sister.  We discussed all findings, falls, labs thus far.  Patient remains hemodynamically unremarkable, with no oxygen requirement, no increased work of breathing at rest.  He, and she confirmed that he is not currently taking a diuretic though he was previously diagnosed with decreased ejection fraction.  It is unclear why this is the case.  Patient had ambulatory referral to our cardiology clinic, was started on IV Lasix, oral Lasix.  No evidence for decompensated status, no new oxygen requirement, no evidence for concurrent infection, bacteremia, sepsis.  Patient is with family members who will monitor progress.  Hospitalization a consideration, but not required. Final  Clinical Impression(s) / ED Diagnoses Final diagnoses:  Acute on chronic systolic congestive heart failure ([place])    Rx / DC Orders ED Discharge Orders          Ordered    Ambulatory referral to Cardiology       Comments: If you have not heard from the Cardiology office within the next 72 hours please call [phone].   04/17/22 1540    furosemide (LASIX) 20 MG tablet  Daily        04/17/22 1540              Carmin Muskrat, MD 04/17/22 1614

## 2022-04-17 NOTE — ED Provider Triage Note (Signed)
Emergency Medicine Provider Triage Evaluation Note  Gregory Perry , a 71 y.o. male  was evaluated in triage.  Pt complains of dyspnea.  History is obtained by EMS individuals who bring the patient from home.  Reportedly the patient has been dyspneic today.  Reported the patient also has had difficulty caring for himself, lives with family members and there were multiple bedbugs present in the domicile.  Review of Systems  Positive: Per HPI Negative: Focal weakness  Physical Exam  There were no vitals taken for this visit. Gen:   Awake, no distress thin elderly male Resp:  Normal effort no distress MSK:   Moves extremities without difficulty no obvious deformity Medical Decision Making  Medically screening exam initiated at 1:29 PM.  Appropriate orders placed.  Gregory Perry was informed that the remainder of the evaluation will be completed by another provider, this initial triage assessment does not replace that evaluation, and the importance of remaining in the ED until their evaluation is complete.   Carmin Muskrat, MD 04/17/22 1331

## 2022-04-25 ENCOUNTER — Encounter: Payer: Self-pay | Admitting: Cardiology

## 2022-04-25 ENCOUNTER — Ambulatory Visit: Payer: Medicare Other | Attending: Cardiology | Admitting: Cardiology

## 2022-04-25 VITALS — BP 138/80 | HR 74 | Ht 71.0 in | Wt 103.0 lb

## 2022-04-25 DIAGNOSIS — I480 Paroxysmal atrial fibrillation: Secondary | ICD-10-CM | POA: Diagnosis not present

## 2022-04-25 DIAGNOSIS — I5022 Chronic systolic (congestive) heart failure: Secondary | ICD-10-CM | POA: Diagnosis not present

## 2022-04-25 DIAGNOSIS — I10 Essential (primary) hypertension: Secondary | ICD-10-CM

## 2022-04-25 DIAGNOSIS — I5023 Acute on chronic systolic (congestive) heart failure: Secondary | ICD-10-CM

## 2022-04-25 MED ORDER — METOPROLOL SUCCINATE ER 25 MG PO TB24: 25.0000 mg | ORAL_TABLET | Freq: Every day | ORAL | 3 refills | Status: DC

## 2022-04-25 MED ORDER — LOSARTAN POTASSIUM 25 MG PO TABS: 25.0000 mg | ORAL_TABLET | Freq: Every day | ORAL | 3 refills | Status: DC

## 2022-04-25 MED ORDER — ATORVASTATIN CALCIUM 40 MG PO TABS: 40.0000 mg | ORAL_TABLET | Freq: Every day | ORAL | 3 refills | Status: DC

## 2022-04-25 MED ORDER — FUROSEMIDE 20 MG PO TABS: 20.0000 mg | ORAL_TABLET | Freq: Every day | ORAL | 3 refills | Status: DC

## 2022-04-25 NOTE — Progress Notes (Signed)
Cardiology Office Note:    Date:  04/25/2022   ID:  Gregory Perry, DOB 12-Oct-1950, MRN [id number]  PCP:  Teena Dunk, NP   [facility] Cardiologist:  Cristopher Peru, MD     Referring MD: Teena Dunk, NP   Chief Complaint  Patient presents with   New Patient (Initial Visit)   Shortness of Breath   Headache   Chest Pain    Discomfort.   Edema    Hands.    History of Present Illness:    Gregory Perry is a 71 y.o. male seen at the request of Bo Merino NP for evaluation of CHF. He has a history of cryptogenic CVA in 2017 with subsequent ILR showing AFib. Has been on chronic Eliquis. Previously seen by Dr Lovena Le- last in 2019. Had CVA in 2017. Echo showed EF 35-40%. Myoview showed a small inferoapical perfusion defect. Cardiac cath done in August 2017 showing diffuse nonobstructive CAD except severe stenosis in RV marginal branch. Medical therapy recommended. He had recurrent CVA in 2021. Had mechanical thrombectomy for right MCA stroke. Records note history of tobacco and Etoh use. Noncompliance. Patient recently seen in ED for CHF exacerbation. Was given IV lasix and sent home on lasix 20 mg daily. Patient has been on Eliquis but has not been taking any other medication. Patient is a poor historian. Notes increase SOB. Some swelling in right leg. Occasional heartburn.   Past Medical History:  Diagnosis Date   Alcohol abuse    Candida esophagitis (HCC)    Cardiomyopathy (HCC)    CHF (congestive heart failure) (HCC)    Coronary artery disease    CVA (cerebral infarction)    Duodenal ulcer    Dysphagia    Headache(784.0)    history of   Hypertension    Prostate cancer ([place]) 12/2018   Severe protein-calorie malnutrition ([place])    Stroke ([place]) 09/25/2019   Vitamin D deficiency 09/2019    Past Surgical History:  Procedure Laterality Date   BALLOON DILATION N/A 11/22/2015   Procedure: BALLOON DILATION;  Surgeon: Mauri Pole, MD;   Location: [facility];  Service: Endoscopy;  Laterality: N/A;   CARDIAC CATHETERIZATION N/A 01/17/2016   Procedure: Left Heart Cath and Coronary Angiography;  Surgeon: Sherren Mocha, MD;  Location: [facility];  Service: Cardiovascular;  Laterality: N/A;   DIRECT LARYNGOSCOPY  05/07/2011   Procedure: DIRECT LARYNGOSCOPY;  Surgeon: Rozetta Nunnery, MD;  Location: [facility];  Service: ENT;  Laterality: N/A;  ESOPHAGOSCOPY  WITH DILATION   EP IMPLANTABLE DEVICE N/A 11/17/2015   Procedure: Loop Recorder Insertion;  Surgeon: Evans Lance, MD;  Location: [facility];  Service: Cardiovascular;  Laterality: N/A;   ESOPHAGOGASTRODUODENOSCOPY  04/29/2011   Procedure: ESOPHAGOGASTRODUODENOSCOPY (EGD);  Surgeon: Winfield Cunas., MD;  Location: [facility];  Service: Endoscopy;  Laterality: N/A;   ESOPHAGOGASTRODUODENOSCOPY N/A 11/22/2015   Procedure: ESOPHAGOGASTRODUODENOSCOPY (EGD);  Surgeon: Mauri Pole, MD;  Location: [facility] ENDOSCOPY;  Service: Endoscopy;  Laterality: N/A;   IR CT HEAD LTD  09/26/2019   IR PERCUTANEOUS ART THROMBECTOMY/INFUSION INTRACRANIAL INC DIAG ANGIO  09/26/2019   IR US GUIDE VASC ACCESS RIGHT  09/26/2019   LOOP RECORDER IMPLANT     RADIOLOGY WITH ANESTHESIA N/A 09/26/2019   Procedure: IR WITH ANESTHESIA;  Surgeon: Luanne Bras, MD;  Location: [facility];  Service: Radiology;  Laterality: N/A;    Current Medications: Current Meds  Medication Sig   Blood  Pressure Monitoring (BLOOD PRESSURE CUFF) MISC Check blood pressure daily between 8 pm -9 pm   ELIQUIS 5 MG TABS tablet TAKE 1 TABLET BY MOUTH TWICE A DAY   losartan (COZAAR) 25 MG tablet Take 1 tablet (25 mg total) by mouth daily.   metoprolol succinate (TOPROL XL) 25 MG 24 hr tablet Take 1 tablet (25 mg total) by mouth daily.   [DISCONTINUED] amLODipine (NORVASC) 5 MG tablet TAKE 2 TABLETS BY MOUTH EVERY DAY   [DISCONTINUED] atorvastatin (LIPITOR) 40 MG tablet Take 1 tablet (40 mg total) by mouth daily.    [DISCONTINUED] docusate sodium (COLACE) 100 MG capsule Take 1 capsule (100 mg total) by mouth 2 (two) times daily as needed for mild constipation.   [DISCONTINUED] feeding supplement, ENSURE ENLIVE, (ENSURE ENLIVE) LIQD Take 237 mLs by mouth 2 (two) times daily between meals.   [DISCONTINUED] ferrous sulfate 325 (65 FE) MG tablet Take 1 tablet (325 mg total) by mouth 3 (three) times daily with meals.   [DISCONTINUED] furosemide (LASIX) 20 MG tablet Take 1 tablet (20 mg total) by mouth daily.   [DISCONTINUED] latanoprost (XALATAN) 0.005 % ophthalmic solution    [DISCONTINUED] lisinopril (ZESTRIL) 40 MG tablet TAKE 1 TABLET(40 MG) BY MOUTH DAILY   [DISCONTINUED] metoprolol tartrate (LOPRESSOR) 25 MG tablet Take 1 tablet (25 mg total) by mouth 2 (two) times daily.   [DISCONTINUED] pantoprazole (PROTONIX) 40 MG tablet Take 1 tablet (40 mg total) by mouth daily.   [DISCONTINUED] polyethylene glycol (MIRALAX / GLYCOLAX) 17 g packet Take 17 g by mouth daily as needed for moderate constipation.   [DISCONTINUED] spironolactone (ALDACTONE) 25 MG tablet Take 1 tablet (25 mg total) by mouth daily.   [DISCONTINUED] Vitamin D, Ergocalciferol, (DRISDOL) 1.25 MG (50000 UNIT) CAPS capsule Take 1 capsule (50,000 Units total) by mouth every 7 (seven) days.     Allergies:   Patient has no known allergies.   Social History   Socioeconomic History   Marital status: Legally Separated    Spouse name: Not on file   Number of children: 1   Years of education: Not on file   Highest education level: Not on file  Occupational History    Comment: retired  Tobacco Use   Smoking status: Every Day    Packs/day: 0.50    Years: 30.00    Total pack years: 15.00    Types: Cigarettes   Smokeless tobacco: Never   Tobacco comments:    Pt smokes 6 to 7 a day trying to quit  Vaping Use   Vaping Use: Never used  Substance and Sexual Activity   Alcohol use: No    Alcohol/week: 3.0 standard drinks of alcohol    Types: 3  Shots of liquor per week    Comment: quit in 10/2015    Drug use: Yes    Types: Marijuana    Comment: occ   Sexual activity: Not Currently  Other Topics Concern   Not on file  Social History Narrative   Not on file   Social Determinants of Health   Financial Resource Strain: Not on file  Food Insecurity: Not on file  Transportation Needs: Not on file  Physical Activity: Not on file  Stress: Not on file  Social Connections: Not on file     Family History: The patient's family history includes Heart attack in his maternal grandfather. There is no history of Colon cancer, Breast cancer, or Prostate cancer.  ROS:   Please see the history of present illness.  All other systems reviewed and are negative.  EKGs/Labs/Other Studies Reviewed:    The following studies were reviewed today:  Myoview 12/26/15: Study Highlights    Nuclear stress EF: 36%. No T wave inversion was noted during stress. There was no ST segment deviation noted during stress. Defect 1: There is a small defect of moderate severity present in the apical inferior and apex location. Findings consistent with prior myocardial infarction. This is a low risk study.   Small size, moderate intensity fixed inferoapical perfusion defect, which is suggestive of scar. LVEF 36% with global hypokinesis and inferoapical akinesis. This is an intermediate risk study.    Cardiac cath 01/10/22:  Left Heart Cath and Coronary Angiography   Conclusion    Prox RCA to Mid RCA lesion, 50 %stenosed. Mid RCA to Dist RCA lesion, 40 %stenosed. Acute Mrg lesion, 90 %stenosed. Ost 1st Mrg to 1st Mrg lesion, 50 %stenosed. Prox LAD to Mid LAD lesion, 40 %stenosed.   1. Mild to moderate diffuse nonobstructive coronary artery disease with the exception of tight stenosis in an RV marginal branch 2. Known moderate LV dysfunction by echo   Regulation: Continue secondary risk reduction measures. The patient does not require PCI. He  likely has a nonischemic cardiomyopathy. He does have diffuse atherosclerosis and should be treated aggressively with medication.     Echo 09/27/19: 1. Left ventricular ejection fraction, by estimation, is 40 to 45%. The left ventricle has mildly decreased function. The left ventricle demonstrates global hypokinesis. There is mild left ventricular hypertrophy. Left ventricular diastolic parameters are consistent with Grade II diastolic dysfunction (pseudonormalization). 2. Right ventricular systolic function is normal. The right ventricular size is normal. There is normal pulmonary artery systolic pressure. 3. Left atrial size was mildly dilated. 4. The mitral valve is normal in structure. Trivial mitral valve regurgitation. No evidence of mitral stenosis. 5. The aortic valve is normal in structure. Aortic valve regurgitation is not visualized. No aortic stenosis is present. 6. The inferior vena cava is normal in size with greater than 50% respiratory variability, suggesting right atrial pressure of 3 mmHg. Comparison(s): The left ventricular hypertrophy has improved. Conclusion(s)/Recommendation(s): No intracardiac source of embolus  EKG:  EKG is not ordered today.  The ekg ordered today demonstrates NSR biventricular hypertrophy. Limb leads reversed. I have personally reviewed and interpreted this study.   Recent Labs: 04/17/2022: ALT 298; B Natriuretic Peptide 3,385.5; BUN 29; Creatinine, Ser 1.19; Hemoglobin 12.5; Platelets 229; Potassium 4.0; Sodium 137  Recent Lipid Panel    Component Value Date/Time   CHOL 160 05/14/2021 1001   TRIG 63 05/14/2021 1001   HDL 45 05/14/2021 1001   CHOLHDL 3.6 05/14/2021 1001   CHOLHDL 3.6 09/26/2019 0624   VLDL 11 09/26/2019 0624   LDLCALC 102 (H) 05/14/2021 1001     Risk Assessment/Calculations:    CHA2DS2-VASc Score = 5   This indicates a 7.2% annual risk of stroke. The patient's score is based upon: CHF History: 1 HTN History:  1 Diabetes History: 0 Stroke History: 2 Vascular Disease History: 0 Age Score: 1 Gender Score: 0               Physical Exam:    VS:  BP 138/80 (BP Location: Left Arm, Patient Position: Sitting, Cuff Size: Normal)   Pulse 74   Ht '5\' 11"'$  (1.803 m)   Wt 103 lb (46.7 kg)   BMI 14.37 kg/m     Wt Readings from Last 3 Encounters:  04/25/22 103 lb (  46.7 kg)  05/14/21 178 lb (80.7 kg)  10/21/19 162 lb 12.8 oz (73.8 kg)     GEN:  thin malnourished BM in NAD HEENT: poor dentition NECK: No JVD; No carotid bruits LYMPHATICS: No lymphadenopathy CARDIAC: RRR, no murmurs, rubs, gallops RESPIRATORY:  Clear to auscultation without rales, wheezing or rhonchi  ABDOMEN: Soft, non-tender, non-distended MUSCULOSKELETAL:  tr  edema; No deformity  SKIN: Warm and dry. Patient is covered with bedbugs NEUROLOGIC:  Alert and oriented x 3 PSYCHIATRIC:  Normal affect   ASSESSMENT:    1. Chronic systolic heart failure ([place])   2. Essential hypertension   3. Acute on chronic systolic heart failure (HCC)   4. Paroxysmal atrial fibrillation (HCC)    PLAN:    In order of problems listed above:  Acute on chronic systolic CHF. Last EF in 2021 was 40-45%. Recent exacerbation seen in ED. Continue lasix. Will add losartan 25 mg daily and Toprol XL 25 mg daily. Will update Echo. Stressed importance of medicine compliance. Sodium restriction CAD. Predominantly nonobstructive by prior cath. No clear anginal symptoms but does have heartburn HTN Bed bug infestation. Reported to health department for treatment. Paroxysmal AFib. In NSR now. On Eliquis S/p embolic CVA x 2 Tobacco abuse - patient reports he quit last month.    Follow up in one month       Medication Adjustments/Labs and Tests Ordered: Current medicines are reviewed at length with the patient today.  Concerns regarding medicines are outlined above.  Orders Placed This Encounter  Procedures   ECHOCARDIOGRAM COMPLETE   Meds ordered  this encounter  Medications   losartan (COZAAR) 25 MG tablet    Sig: Take 1 tablet (25 mg total) by mouth daily.    Dispense:  90 tablet    Refill:  3   metoprolol succinate (TOPROL XL) 25 MG 24 hr tablet    Sig: Take 1 tablet (25 mg total) by mouth daily.    Dispense:  90 tablet    Refill:  3   atorvastatin (LIPITOR) 40 MG tablet    Sig: Take 1 tablet (40 mg total) by mouth daily.    Dispense:  90 tablet    Refill:  3   furosemide (LASIX) 20 MG tablet    Sig: Take 1 tablet (20 mg total) by mouth daily.    Dispense:  90 tablet    Refill:  3    Patient Instructions  Medication Instructions:  Take Toprol XL 25 mg daily Take Losartan 25 mg daily Take Lasix 20 mg daily Take Eliguis 5 mg twice a day Take Atorvastatin 40 mg daily after dinner *If you need a refill on your cardiac medications before your next appointment, please call your pharmacy*   Lab Work: None ordered  Testing/Procedures: Echo   Follow-Up: At [facility], you and your health needs are our priority.  As part of our continuing mission to provide you with exceptional heart care, we have created designated Provider Care Teams.  These Care Teams include your primary Cardiologist (physician) and Advanced Practice Providers (APPs -  Physician Assistants and Nurse Practitioners) who all work together to provide you with the care you need, when you need it.  We recommend signing up for the patient portal called "MyChart".  Sign up information is provided on this After Visit Summary.  MyChart is used to connect with patients for Virtual Visits (Telemedicine).  Patients are able to view lab/test results, encounter notes, upcoming appointments, etc.  Non-urgent  messages can be sent to your provider as well.   To learn more about what you can do with MyChart, go to [email or web address].    Your next appointment: After Echo    The format for your next appointment: Office   Provider: Dr.Bandon Sherwin's  PA   Important Information About Sugar         Signed, Supreme Rybarczyk [name], MD  04/25/2022 10:09 AM    [place]

## 2022-04-25 NOTE — Patient Instructions (Signed)
Medication Instructions:  Take Toprol XL 25 mg daily Take Losartan 25 mg daily Take Lasix 20 mg daily Take Eliguis 5 mg twice a day Take Atorvastatin 40 mg daily after dinner *If you need a refill on your cardiac medications before your next appointment, please call your pharmacy*   Lab Work: None ordered  Testing/Procedures: Echo   Follow-Up: At [facility], you and your health needs are our priority.  As part of our continuing mission to provide you with exceptional heart care, we have created designated Provider Care Teams.  These Care Teams include your primary Cardiologist (physician) and Advanced Practice Providers (APPs -  Physician Assistants and Nurse Practitioners) who all work together to provide you with the care you need, when you need it.  We recommend signing up for the patient portal called "MyChart".  Sign up information is provided on this After Visit Summary.  MyChart is used to connect with patients for Virtual Visits (Telemedicine).  Patients are able to view lab/test results, encounter notes, upcoming appointments, etc.  Non-urgent messages can be sent to your provider as well.   To learn more about what you can do with MyChart, go to [email or web address].    Your next appointment: After Echo    The format for your next appointment: Office   Provider: Dr.Jordan's PA   Important Information About Sugar

## 2022-04-26 ENCOUNTER — Telehealth: Payer: Self-pay | Admitting: Licensed Clinical Social Worker

## 2022-04-26 NOTE — Telephone Encounter (Signed)
H&V Care Navigation CSW Progress Note  Clinical Social Worker contacted patient by phone to f/u on referral for issues with bed bugs in home. LCSW was passed from one phone to another (pt phone does not work well- first called [name] and then was called back by [name]). LCSW introduced self, role, reason for call. Pt confirmed home address, lives with sister and brother. They rent the home, they last contacted landlord about 2 years ago, have had bed bugs for about a year. I encouraged them to first call the landlord and make them aware. They may have a preferred treatment plan or company. If they do not they may have patient/family responsible for completing treatment. Our team may be able to assist with cost of treatment if eligible under Patient Care Fund. Pt and pt sister Gregory Perry aware and will contact landlord and let me know if there are any issues with cost etc. I will f/u as able.   Patient is participating in a Managed Medicaid Plan:  No, U[employer] Medicare   [place]: Medium Risk (04/26/2022)  Alcohol Screen: Low Risk  (05/14/2021)  Depression (PHQ2-9): Low Risk  (05/14/2021)  Tobacco Use: High Risk (04/25/2022)    Westley Hummer, MSW, [place]  [phone]- work cell phone (preferred) [phone]- desk phone

## 2022-05-03 ENCOUNTER — Telehealth: Payer: Self-pay | Admitting: Licensed Clinical Social Worker

## 2022-05-03 NOTE — Telephone Encounter (Signed)
H&V Care Navigation CSW Progress Note  Clinical Social Worker contacted patient by phone to f/u on assistance regarding pest issues. Pt and pt family had been advised to contact landlord first. Pt gave permission for me to speak with sister. Attempted to contact [name] today at [phone], no answer, left voicemail requesting call back as able. Will reattempt.   Patient is participating in a Managed Medicaid Plan:  No, [place] Medicare only  [place]: Medium Risk (04/26/2022)  Alcohol Screen: Low Risk  (05/14/2021)  Depression (PHQ2-9): Low Risk  (05/14/2021)  Tobacco Use: High Risk (04/25/2022)    Westley Hummer, MSW, [place]  [phone]- work cell phone (preferred) [phone]- desk phone

## 2022-05-06 ENCOUNTER — Telehealth: Payer: Self-pay | Admitting: Licensed Clinical Social Worker

## 2022-05-06 NOTE — Telephone Encounter (Signed)
H&V Care Navigation CSW Progress Note  Clinical Social Worker contacted patient by phone to f/u on assistance regarding pest issues. Pt and pt family had been advised to contact landlord first. Pt gave permission for me to speak with sister. Attempted to contact [name] again today at [phone], no answer again, left 2nd voicemail requesting call back as able. Will reattempt a third time as able.    Patient is participating in a Managed Medicaid Plan:  No, [place] Medicare only    [place]: Medium Risk (04/26/2022)  Alcohol Screen: Low Risk  (05/14/2021)  Depression (PHQ2-9): Low Risk  (05/14/2021)  Tobacco Use: High Risk (04/25/2022)   Westley Hummer, MSW, [place]  [phone]- work cell phone (preferred) [phone]- desk phone

## 2022-05-07 ENCOUNTER — Telehealth: Payer: Self-pay | Admitting: Licensed Clinical Social Worker

## 2022-05-07 NOTE — Telephone Encounter (Signed)
H&V Care Navigation CSW Progress Note  Clinical Social Worker  received a call back from pt sister [name] (DPR on file)  to discuss pest challenges. Pt sister shares that they have hired someone to come in and treat the home. They are arranging somewhere to go for three days while this treatment is completed and works. They were encouraged to alert the landlord of the treatment to avoid any future issues. No additional questions at this time, pt family encouraged to let us know if we can assist in future at all.   Patient is participating in a Managed Medicaid Plan:  no, [facility] medicare only  [place]: Medium Risk (04/26/2022)  Alcohol Screen: Low Risk  (05/14/2021)  Depression (PHQ2-9): Low Risk  (05/14/2021)  Tobacco Use: High Risk (04/25/2022)    Westley Hummer, MSW, [place]  [phone]- work cell phone (preferred) [phone]- desk phone

## 2022-05-17 ENCOUNTER — Other Ambulatory Visit: Payer: Self-pay

## 2022-05-17 ENCOUNTER — Ambulatory Visit (HOSPITAL_BASED_OUTPATIENT_CLINIC_OR_DEPARTMENT_OTHER): Payer: Medicare Other

## 2022-05-17 ENCOUNTER — Emergency Department (HOSPITAL_COMMUNITY): Payer: Medicare Other

## 2022-05-17 ENCOUNTER — Inpatient Hospital Stay (HOSPITAL_COMMUNITY)
Admission: EM | Admit: 2022-05-17 | Discharge: 2022-05-23 | DRG: 291 | Disposition: A | Discharge status: Hospice | Payer: Medicare Other | Attending: Family Medicine | Admitting: Family Medicine

## 2022-05-17 ENCOUNTER — Encounter (HOSPITAL_COMMUNITY): Payer: Self-pay

## 2022-05-17 ENCOUNTER — Telehealth: Payer: Self-pay | Admitting: *Deleted

## 2022-05-17 DIAGNOSIS — I5084 End stage heart failure: Secondary | ICD-10-CM | POA: Diagnosis present

## 2022-05-17 DIAGNOSIS — I5022 Chronic systolic (congestive) heart failure: Secondary | ICD-10-CM

## 2022-05-17 DIAGNOSIS — R7989 Other specified abnormal findings of blood chemistry: Secondary | ICD-10-CM

## 2022-05-17 DIAGNOSIS — R7401 Elevation of levels of liver transaminase levels: Secondary | ICD-10-CM | POA: Diagnosis present

## 2022-05-17 DIAGNOSIS — Z6820 Body mass index (BMI) 20.0-20.9, adult: Secondary | ICD-10-CM

## 2022-05-17 DIAGNOSIS — I428 Other cardiomyopathies: Secondary | ICD-10-CM | POA: Diagnosis present

## 2022-05-17 DIAGNOSIS — I10 Essential (primary) hypertension: Secondary | ICD-10-CM | POA: Diagnosis present

## 2022-05-17 DIAGNOSIS — I480 Paroxysmal atrial fibrillation: Secondary | ICD-10-CM | POA: Diagnosis present

## 2022-05-17 DIAGNOSIS — I110 Hypertensive heart disease with heart failure: Principal | ICD-10-CM | POA: Diagnosis present

## 2022-05-17 DIAGNOSIS — Z8711 Personal history of peptic ulcer disease: Secondary | ICD-10-CM

## 2022-05-17 DIAGNOSIS — Z79899 Other long term (current) drug therapy: Secondary | ICD-10-CM

## 2022-05-17 DIAGNOSIS — D696 Thrombocytopenia, unspecified: Secondary | ICD-10-CM | POA: Diagnosis present

## 2022-05-17 DIAGNOSIS — Z8249 Family history of ischemic heart disease and other diseases of the circulatory system: Secondary | ICD-10-CM

## 2022-05-17 DIAGNOSIS — Z7901 Long term (current) use of anticoagulants: Secondary | ICD-10-CM

## 2022-05-17 DIAGNOSIS — I5023 Acute on chronic systolic (congestive) heart failure: Secondary | ICD-10-CM | POA: Diagnosis present

## 2022-05-17 DIAGNOSIS — I5042 Chronic combined systolic (congestive) and diastolic (congestive) heart failure: Secondary | ICD-10-CM

## 2022-05-17 DIAGNOSIS — K7200 Acute and subacute hepatic failure without coma: Secondary | ICD-10-CM | POA: Diagnosis present

## 2022-05-17 DIAGNOSIS — I959 Hypotension, unspecified: Secondary | ICD-10-CM | POA: Diagnosis not present

## 2022-05-17 DIAGNOSIS — K761 Chronic passive congestion of liver: Secondary | ICD-10-CM | POA: Diagnosis present

## 2022-05-17 DIAGNOSIS — I255 Ischemic cardiomyopathy: Secondary | ICD-10-CM | POA: Diagnosis present

## 2022-05-17 DIAGNOSIS — Z8546 Personal history of malignant neoplasm of prostate: Secondary | ICD-10-CM

## 2022-05-17 DIAGNOSIS — Z8673 Personal history of transient ischemic attack (TIA), and cerebral infarction without residual deficits: Secondary | ICD-10-CM

## 2022-05-17 DIAGNOSIS — F17210 Nicotine dependence, cigarettes, uncomplicated: Secondary | ICD-10-CM | POA: Diagnosis present

## 2022-05-17 DIAGNOSIS — F1010 Alcohol abuse, uncomplicated: Secondary | ICD-10-CM | POA: Diagnosis present

## 2022-05-17 DIAGNOSIS — Z8619 Personal history of other infectious and parasitic diseases: Secondary | ICD-10-CM

## 2022-05-17 DIAGNOSIS — I4719 Other supraventricular tachycardia: Secondary | ICD-10-CM | POA: Diagnosis present

## 2022-05-17 DIAGNOSIS — I493 Ventricular premature depolarization: Secondary | ICD-10-CM | POA: Diagnosis present

## 2022-05-17 DIAGNOSIS — Z91148 Patient's other noncompliance with medication regimen for other reason: Secondary | ICD-10-CM

## 2022-05-17 DIAGNOSIS — I358 Other nonrheumatic aortic valve disorders: Secondary | ICD-10-CM | POA: Diagnosis present

## 2022-05-17 DIAGNOSIS — E43 Unspecified severe protein-calorie malnutrition: Secondary | ICD-10-CM | POA: Diagnosis present

## 2022-05-17 DIAGNOSIS — Z635 Disruption of family by separation and divorce: Secondary | ICD-10-CM

## 2022-05-17 DIAGNOSIS — R0602 Shortness of breath: Secondary | ICD-10-CM | POA: Diagnosis not present

## 2022-05-17 DIAGNOSIS — I513 Intracardiac thrombosis, not elsewhere classified: Secondary | ICD-10-CM | POA: Diagnosis present

## 2022-05-17 DIAGNOSIS — I5082 Biventricular heart failure: Secondary | ICD-10-CM | POA: Diagnosis present

## 2022-05-17 DIAGNOSIS — L89322 Pressure ulcer of left buttock, stage 2: Secondary | ICD-10-CM | POA: Diagnosis present

## 2022-05-17 DIAGNOSIS — Z515 Encounter for palliative care: Secondary | ICD-10-CM

## 2022-05-17 DIAGNOSIS — N289 Disorder of kidney and ureter, unspecified: Secondary | ICD-10-CM

## 2022-05-17 DIAGNOSIS — N179 Acute kidney failure, unspecified: Secondary | ICD-10-CM | POA: Diagnosis present

## 2022-05-17 DIAGNOSIS — L8990 Pressure ulcer of unspecified site, unspecified stage: Secondary | ICD-10-CM | POA: Insufficient documentation

## 2022-05-17 DIAGNOSIS — I2510 Atherosclerotic heart disease of native coronary artery without angina pectoris: Secondary | ICD-10-CM | POA: Diagnosis present

## 2022-05-17 LAB — COMPREHENSIVE METABOLIC PANEL
ALT: 141 U/L — ABNORMAL HIGH (ref 0–44)
AST: 132 U/L — ABNORMAL HIGH (ref 15–41)
Albumin: 3.1 g/dL — ABNORMAL LOW (ref 3.5–5.0)
Alkaline Phosphatase: 253 U/L — ABNORMAL HIGH (ref 38–126)
Anion gap: 17 — ABNORMAL HIGH (ref 5–15)
BUN: 25 mg/dL — ABNORMAL HIGH (ref 8–23)
CO2: 21 mmol/L — ABNORMAL LOW (ref 22–32)
Calcium: 9.1 mg/dL (ref 8.9–10.3)
Chloride: 102 mmol/L (ref 98–111)
Creatinine, Ser: 1.39 mg/dL — ABNORMAL HIGH (ref 0.61–1.24)
GFR, Estimated: 55 mL/min — ABNORMAL LOW (ref >60)
Glucose, Bld: 99 mg/dL (ref 70–99)
Potassium: 4.2 mmol/L (ref 3.5–5.1)
Sodium: 140 mmol/L (ref 135–145)
Total Bilirubin: 3.2 mg/dL — ABNORMAL HIGH (ref 0.3–1.2)
Total Protein: 6.9 g/dL (ref 6.5–8.1)

## 2022-05-17 LAB — CBC WITH DIFFERENTIAL/PLATELET
Abs Immature Granulocytes: 0.03 10*3/uL (ref 0.00–0.07)
Basophils Absolute: 0 10*3/uL (ref 0.0–0.1)
Basophils Relative: 0 %
Eosinophils Absolute: 0 10*3/uL (ref 0.0–0.5)
Eosinophils Relative: 0 %
HCT: 46.8 % (ref 39.0–52.0)
Hemoglobin: 15.2 g/dL (ref 13.0–17.0)
Immature Granulocytes: 1 %
Lymphocytes Relative: 21 %
Lymphs Abs: 1.1 10*3/uL (ref 0.7–4.0)
MCH: 31.5 pg (ref 26.0–34.0)
MCHC: 32.5 g/dL (ref 30.0–36.0)
MCV: 96.9 fL (ref 80.0–100.0)
Monocytes Absolute: 0.4 10*3/uL (ref 0.1–1.0)
Monocytes Relative: 7 %
Neutro Abs: 3.8 10*3/uL (ref 1.7–7.7)
Neutrophils Relative %: 71 %
Platelets: 83 10*3/uL — ABNORMAL LOW (ref 150–400)
RBC: 4.83 MIL/uL (ref 4.22–5.81)
RDW: 14.8 % (ref 11.5–15.5)
WBC: 5.3 10*3/uL (ref 4.0–10.5)
nRBC: 0.8 % — ABNORMAL HIGH (ref 0.0–0.2)

## 2022-05-17 LAB — ECHOCARDIOGRAM COMPLETE: S' Lateral: 6.4 cm

## 2022-05-17 LAB — BRAIN NATRIURETIC PEPTIDE: B Natriuretic Peptide: 3932 pg/mL — ABNORMAL HIGH (ref 0.0–100.0)

## 2022-05-17 MED ORDER — PERFLUTREN LIPID MICROSPHERE
1.0000 mL | INTRAVENOUS | Status: AC | PRN
  Administered 2022-05-17: 2 mL via INTRAVENOUS

## 2022-05-17 NOTE — ED Triage Notes (Signed)
Pt arrived POV from the drs office stating they told him to come here because he might have a blood clot and his body is full of fluid. Pt denies any pain but states he has been [facility].

## 2022-05-17 NOTE — Telephone Encounter (Signed)
Pt in office for echocardiogram. Concerning result for clot. Dr. Curt Bears sending pt to E.D. for clot & heart failure.

## 2022-05-17 NOTE — ED Provider Triage Note (Signed)
Emergency Medicine Provider Triage Evaluation Note  Gregory Perry , a 71 y.o. male  was evaluated in triage.  Pt complains of shortness of breath.  Patient was sent over by cardiology after having echocardiogram concern for a clot in worsening heart failure.  Denies chest pain, cough, fever, syncope.  Review of Systems  Positive: As above Negative: As above  Physical Exam  BP (!) 135/105 (BP Location: Right Arm)   Pulse 91   Resp 18   SpO2 96%  Gen:   Awake, no distress   Resp:  Normal effort, decreased breath sounds in bases MSK:   Moves extremities without difficulty  Other:    Medical Decision Making  Medically screening exam initiated at 1:51 PM.  Appropriate orders placed.  KHIRY PASQUARIELLO was informed that the remainder of the evaluation will be completed by another provider, this initial triage assessment does not replace that evaluation, and the importance of remaining in the ED until their evaluation is complete.     Theressa Stamps R, [place] 05/17/22 1353

## 2022-05-18 ENCOUNTER — Observation Stay (HOSPITAL_COMMUNITY): Payer: Medicare Other

## 2022-05-18 DIAGNOSIS — I5084 End stage heart failure: Secondary | ICD-10-CM | POA: Diagnosis present

## 2022-05-18 DIAGNOSIS — I480 Paroxysmal atrial fibrillation: Secondary | ICD-10-CM | POA: Diagnosis present

## 2022-05-18 DIAGNOSIS — I110 Hypertensive heart disease with heart failure: Secondary | ICD-10-CM | POA: Diagnosis present

## 2022-05-18 DIAGNOSIS — I5082 Biventricular heart failure: Secondary | ICD-10-CM | POA: Diagnosis present

## 2022-05-18 DIAGNOSIS — I428 Other cardiomyopathies: Secondary | ICD-10-CM | POA: Diagnosis present

## 2022-05-18 DIAGNOSIS — K761 Chronic passive congestion of liver: Secondary | ICD-10-CM | POA: Diagnosis present

## 2022-05-18 DIAGNOSIS — I255 Ischemic cardiomyopathy: Secondary | ICD-10-CM

## 2022-05-18 DIAGNOSIS — R7401 Elevation of levels of liver transaminase levels: Secondary | ICD-10-CM

## 2022-05-18 DIAGNOSIS — R7989 Other specified abnormal findings of blood chemistry: Secondary | ICD-10-CM

## 2022-05-18 DIAGNOSIS — Z79899 Other long term (current) drug therapy: Secondary | ICD-10-CM | POA: Diagnosis not present

## 2022-05-18 DIAGNOSIS — I959 Hypotension, unspecified: Secondary | ICD-10-CM | POA: Diagnosis not present

## 2022-05-18 DIAGNOSIS — N289 Disorder of kidney and ureter, unspecified: Secondary | ICD-10-CM | POA: Diagnosis not present

## 2022-05-18 DIAGNOSIS — Z7901 Long term (current) use of anticoagulants: Secondary | ICD-10-CM

## 2022-05-18 DIAGNOSIS — Z7189 Other specified counseling: Secondary | ICD-10-CM

## 2022-05-18 DIAGNOSIS — I513 Intracardiac thrombosis, not elsewhere classified: Secondary | ICD-10-CM

## 2022-05-18 DIAGNOSIS — E43 Unspecified severe protein-calorie malnutrition: Secondary | ICD-10-CM | POA: Diagnosis present

## 2022-05-18 DIAGNOSIS — I2510 Atherosclerotic heart disease of native coronary artery without angina pectoris: Secondary | ICD-10-CM | POA: Diagnosis present

## 2022-05-18 DIAGNOSIS — Z515 Encounter for palliative care: Secondary | ICD-10-CM | POA: Diagnosis not present

## 2022-05-18 DIAGNOSIS — I10 Essential (primary) hypertension: Secondary | ICD-10-CM | POA: Diagnosis not present

## 2022-05-18 DIAGNOSIS — L89322 Pressure ulcer of left buttock, stage 2: Secondary | ICD-10-CM | POA: Diagnosis present

## 2022-05-18 DIAGNOSIS — Z5181 Encounter for therapeutic drug level monitoring: Secondary | ICD-10-CM

## 2022-05-18 DIAGNOSIS — K7200 Acute and subacute hepatic failure without coma: Secondary | ICD-10-CM | POA: Diagnosis present

## 2022-05-18 DIAGNOSIS — N179 Acute kidney failure, unspecified: Secondary | ICD-10-CM | POA: Diagnosis present

## 2022-05-18 DIAGNOSIS — Z8249 Family history of ischemic heart disease and other diseases of the circulatory system: Secondary | ICD-10-CM | POA: Diagnosis not present

## 2022-05-18 DIAGNOSIS — D696 Thrombocytopenia, unspecified: Secondary | ICD-10-CM | POA: Diagnosis present

## 2022-05-18 DIAGNOSIS — F17210 Nicotine dependence, cigarettes, uncomplicated: Secondary | ICD-10-CM | POA: Diagnosis present

## 2022-05-18 DIAGNOSIS — Z8546 Personal history of malignant neoplasm of prostate: Secondary | ICD-10-CM | POA: Diagnosis not present

## 2022-05-18 DIAGNOSIS — I4719 Other supraventricular tachycardia: Secondary | ICD-10-CM | POA: Diagnosis present

## 2022-05-18 DIAGNOSIS — F1010 Alcohol abuse, uncomplicated: Secondary | ICD-10-CM | POA: Diagnosis present

## 2022-05-18 DIAGNOSIS — I5023 Acute on chronic systolic (congestive) heart failure: Secondary | ICD-10-CM

## 2022-05-18 DIAGNOSIS — R0602 Shortness of breath: Secondary | ICD-10-CM | POA: Diagnosis present

## 2022-05-18 DIAGNOSIS — I358 Other nonrheumatic aortic valve disorders: Secondary | ICD-10-CM | POA: Diagnosis present

## 2022-05-18 LAB — CBC
HCT: 43.5 % (ref 39.0–52.0)
Hemoglobin: 14.8 g/dL (ref 13.0–17.0)
MCH: 32 pg (ref 26.0–34.0)
MCHC: 34 g/dL (ref 30.0–36.0)
MCV: 94.2 fL (ref 80.0–100.0)
Platelets: 88 10*3/uL — ABNORMAL LOW (ref 150–400)
RBC: 4.62 MIL/uL (ref 4.22–5.81)
RDW: 14.9 % (ref 11.5–15.5)
WBC: 6.2 10*3/uL (ref 4.0–10.5)
nRBC: 0.8 % — ABNORMAL HIGH (ref 0.0–0.2)

## 2022-05-18 LAB — APTT
aPTT: 123 seconds — ABNORMAL HIGH (ref 24–36)
aPTT: 100 seconds — ABNORMAL HIGH (ref 24–36)

## 2022-05-18 LAB — HEPARIN LEVEL (UNFRACTIONATED)
Heparin Unfractionated: 0.36 IU/mL (ref 0.30–0.70)
Heparin Unfractionated: 0.27 IU/mL — ABNORMAL LOW (ref 0.30–0.70)

## 2022-05-18 LAB — HIV ANTIBODY (ROUTINE TESTING W REFLEX): HIV Screen 4th Generation wRfx: NONREACTIVE

## 2022-05-18 LAB — HEPATITIS PANEL, ACUTE
HCV Ab: NONREACTIVE
Hep A IgM: NONREACTIVE
Hep B C IgM: NONREACTIVE
Hepatitis B Surface Ag: NONREACTIVE

## 2022-05-18 LAB — BRAIN NATRIURETIC PEPTIDE: B Natriuretic Peptide: 2203.3 pg/mL — ABNORMAL HIGH (ref 0.0–100.0)

## 2022-05-18 LAB — HEPATIC FUNCTION PANEL
ALT: 35 U/L (ref 0–44)
AST: 31 U/L (ref 15–41)
Albumin: <1.5 g/dL — ABNORMAL LOW (ref 3.5–5.0)
Alkaline Phosphatase: 55 U/L (ref 38–126)
Bilirubin, Direct: 0.2 mg/dL (ref 0.0–0.2)
Indirect Bilirubin: 0.3 mg/dL (ref 0.3–0.9)
Total Bilirubin: 0.5 mg/dL (ref 0.3–1.2)
Total Protein: <3 g/dL — ABNORMAL LOW (ref 6.5–8.1)

## 2022-05-18 LAB — TROPONIN I (HIGH SENSITIVITY)
Troponin I (High Sensitivity): 9 ng/L (ref <18)
Troponin I (High Sensitivity): 24 ng/L — ABNORMAL HIGH (ref <18)

## 2022-05-18 LAB — PROTIME-INR
INR: 1.5 — ABNORMAL HIGH (ref 0.8–1.2)
Prothrombin Time: 18 seconds — ABNORMAL HIGH (ref 11.4–15.2)

## 2022-05-18 MED ORDER — SPIRONOLACTONE 12.5 MG HALF TABLET
12.5000 mg | ORAL_TABLET | Freq: Every day | ORAL | Status: DC
  Administered 2022-05-18 – 2022-05-21 (×4): 12.5 mg via ORAL
  Filled 2022-05-18 (×4): qty 1

## 2022-05-18 MED ORDER — FUROSEMIDE 10 MG/ML IJ SOLN
40.0000 mg | Freq: Every day | INTRAMUSCULAR | Status: DC
  Administered 2022-05-18: 40 mg via INTRAVENOUS
  Filled 2022-05-18: qty 4

## 2022-05-18 MED ORDER — HEPARIN (PORCINE) 25000 UT/250ML-% IV SOLN
1100.0000 [IU]/h | INTRAVENOUS | Status: AC
  Administered 2022-05-18: 1200 [IU]/h via INTRAVENOUS
  Administered 2022-05-18: 1100 [IU]/h via INTRAVENOUS
  Filled 2022-05-18 (×2): qty 250

## 2022-05-18 MED ORDER — FUROSEMIDE 10 MG/ML IJ SOLN
40.0000 mg | Freq: Once | INTRAMUSCULAR | Status: AC
  Administered 2022-05-18: 40 mg via INTRAVENOUS
  Filled 2022-05-18: qty 4

## 2022-05-18 MED ORDER — SODIUM CHLORIDE 0.9 % IV SOLN: 250.0000 mL | INTRAVENOUS | Status: DC | PRN

## 2022-05-18 MED ORDER — HEPARIN BOLUS VIA INFUSION
2000.0000 [IU] | Freq: Once | INTRAVENOUS | Status: AC
  Administered 2022-05-18: 2000 [IU] via INTRAVENOUS
  Filled 2022-05-18: qty 2000

## 2022-05-18 MED ORDER — ONDANSETRON HCL 4 MG/2ML IJ SOLN: 4.0000 mg | Freq: Four times a day (QID) | INTRAMUSCULAR | Status: DC | PRN

## 2022-05-18 MED ORDER — SODIUM CHLORIDE 0.9% FLUSH
3.0000 mL | Freq: Two times a day (BID) | INTRAVENOUS | Status: DC
  Administered 2022-05-18 – 2022-05-23 (×9): 3 mL via INTRAVENOUS

## 2022-05-18 MED ORDER — RIVAROXABAN 20 MG PO TABS
20.0000 mg | ORAL_TABLET | Freq: Every day | ORAL | Status: DC
  Administered 2022-05-19 – 2022-05-23 (×5): 20 mg via ORAL
  Filled 2022-05-18 (×5): qty 1

## 2022-05-18 MED ORDER — METOPROLOL SUCCINATE ER 25 MG PO TB24
25.0000 mg | ORAL_TABLET | Freq: Every day | ORAL | Status: DC
  Administered 2022-05-18: 25 mg via ORAL
  Filled 2022-05-18: qty 1

## 2022-05-18 MED ORDER — SODIUM CHLORIDE 0.9% FLUSH: 3.0000 mL | INTRAVENOUS | Status: DC | PRN

## 2022-05-18 MED ORDER — FUROSEMIDE 10 MG/ML IJ SOLN
80.0000 mg | Freq: Two times a day (BID) | INTRAMUSCULAR | Status: DC
  Administered 2022-05-18: 80 mg via INTRAVENOUS
  Filled 2022-05-18: qty 8

## 2022-05-18 MED ORDER — ATORVASTATIN CALCIUM 40 MG PO TABS
40.0000 mg | ORAL_TABLET | Freq: Every day | ORAL | Status: DC
  Administered 2022-05-18 – 2022-05-19 (×2): 40 mg via ORAL
  Filled 2022-05-18 (×2): qty 1

## 2022-05-18 MED ORDER — ACETAMINOPHEN 325 MG PO TABS: 650.0000 mg | ORAL_TABLET | ORAL | Status: DC | PRN

## 2022-05-18 MED ORDER — DIGOXIN 125 MCG PO TABS
0.1250 mg | ORAL_TABLET | Freq: Every day | ORAL | Status: DC
  Administered 2022-05-18 – 2022-05-23 (×6): 0.125 mg via ORAL
  Filled 2022-05-18 (×6): qty 1

## 2022-05-18 NOTE — ED Notes (Addendum)
This RN answered call light for patient and went into room, finding patient sitting on the floor beside his bed, leaned up against his bed.  Patient alert and oriented, stated that he used the urinal and went to sit back down on  his bed and missed the bed, sliding down the side of the bed onto the floor. MD Roxanne Mins to bedside immediately to assess patient, orders as follows.  Patient assisted back to bed.

## 2022-05-18 NOTE — Progress Notes (Signed)
Patient brought to 4E from ED. VSS. Telemetry box applied, CCMD notified. Patient oriented to room and staff. Call bell in reach.  Lovene Maret L Lekisha Mcghee, RN  

## 2022-05-18 NOTE — Assessment & Plan Note (Addendum)
Hepatitis/hepatic congestion due to severe CHF? Viral hepatitis panel all nonreactive  Infarct from embolus given mural thrombus? -had painless Jaundice for past 1 year...? ETOH abuse  [place] in Jan unrevealing, but probably warrants repeat imaging at this point. -Questionable history of EtOH abuse, likely exacerbated congestive hepatopathy - severe CHF now  l -Monitor LFTs closely -Avoiding hepatotoxins     Latest Ref Rng & Units 05/21/2022    1:34 AM 05/20/2022    1:05 AM 05/19/2022    3:54 AM  Hepatic Function  Total Protein 6.5 - 8.1 g/dL 5.9  5.6  6.1   Albumin 3.5 - 5.0 g/dL 2.4  2.5  2.7   AST 15 - 41 U/L 79  92  104   ALT 0 - 44 U/L 128  136  137   Alk Phosphatase 38 - 126 U/L 170  179  202   Total Bilirubin 0.3 - 1.2 mg/dL 1.5  1.5  2.1

## 2022-05-18 NOTE — Assessment & Plan Note (Addendum)
Due to Non-adhearance to eliquis: Per cardiology considering switching to Xarelto once a day therefore within the next 48 hours  We dont think that this represents a failure of DOAC therapy at this point. Heparin gtt will discontinue 05/19/2022 Switching to Xarelto today 05/19/2022

## 2022-05-18 NOTE — Progress Notes (Signed)
Patient had 8 beats of V-tach,patient remains asymptomatic.Will continue to monitor.

## 2022-05-18 NOTE — H&P (Signed)
History and Physical    Patient: Gregory Perry RCV:[id number] DOB: 1950/09/02 DOA: 05/17/2022 DOS: the patient was seen and examined on 05/18/2022 PCP: Teena Dunk, NP  Patient coming from: Home  Chief Complaint:  Chief Complaint  Patient presents with   Shortness of Breath   HPI: Gregory Perry is a 71 y.o. male with medical history significant of HTN, stroke, CAD, PAF on eliquis.  Was taking eliquis, but not adherent to any other meds as of Dr. Doug Sou office note 11/30.  Pt sent in to ED today from cards office after 2d echo showed worsened EF < 20% and L apical mural thrombus.  He has been having shortness of breath for several months with symptoms waxing and waning but no recent worsening. He has chronic edema which is also worsening. He does have some intermittent chest discomfort.   Pt admits to me he frequently misses doses of his meds including eliquis.  Review of Systems: As mentioned in the history of present illness. All other systems reviewed and are negative. Past Medical History:  Diagnosis Date   Alcohol abuse    Candida esophagitis (HCC)    Cardiomyopathy (HCC)    CHF (congestive heart failure) (HCC)    Coronary artery disease    CVA (cerebral infarction)    Duodenal ulcer    Dysphagia    Headache(784.0)    history of   Hypertension    Prostate cancer ([place]) 12/2018   Severe protein-calorie malnutrition ([place])    Stroke ([place]) 09/25/2019   Vitamin D deficiency 09/2019   Past Surgical History:  Procedure Laterality Date   BALLOON DILATION N/A 11/22/2015   Procedure: BALLOON DILATION;  Surgeon: Mauri Pole, MD;  Location: [facility];  Service: Endoscopy;  Laterality: N/A;   CARDIAC CATHETERIZATION N/A 01/17/2016   Procedure: Left Heart Cath and Coronary Angiography;  Surgeon: Sherren Mocha, MD;  Location: [facility];  Service: Cardiovascular;  Laterality: N/A;   DIRECT LARYNGOSCOPY  05/07/2011   Procedure: DIRECT  LARYNGOSCOPY;  Surgeon: Rozetta Nunnery, MD;  Location: [facility];  Service: ENT;  Laterality: N/A;  ESOPHAGOSCOPY  WITH DILATION   EP IMPLANTABLE DEVICE N/A 11/17/2015   Procedure: Loop Recorder Insertion;  Surgeon: Evans Lance, MD;  Location: [facility];  Service: Cardiovascular;  Laterality: N/A;   ESOPHAGOGASTRODUODENOSCOPY  04/29/2011   Procedure: ESOPHAGOGASTRODUODENOSCOPY (EGD);  Surgeon: Winfield Cunas., MD;  Location: [facility];  Service: Endoscopy;  Laterality: N/A;   ESOPHAGOGASTRODUODENOSCOPY N/A 11/22/2015   Procedure: ESOPHAGOGASTRODUODENOSCOPY (EGD);  Surgeon: Mauri Pole, MD;  Location: [facility] ENDOSCOPY;  Service: Endoscopy;  Laterality: N/A;   IR CT HEAD LTD  09/26/2019   IR PERCUTANEOUS ART THROMBECTOMY/INFUSION INTRACRANIAL INC DIAG ANGIO  09/26/2019   IR US GUIDE VASC ACCESS RIGHT  09/26/2019   LOOP RECORDER IMPLANT     RADIOLOGY WITH ANESTHESIA N/A 09/26/2019   Procedure: IR WITH ANESTHESIA;  Surgeon: Luanne Bras, MD;  Location: [facility];  Service: Radiology;  Laterality: N/A;   Social History:  reports that he has been smoking cigarettes. He has a 15.00 pack-year smoking history. He has never used smokeless tobacco. He reports current drug use. Drug: Marijuana. He reports that he does not drink alcohol.  No Known Allergies  Family History  Problem Relation Age of Onset   Heart attack Maternal Grandfather    Colon cancer Neg Hx    Breast cancer Neg Hx    Prostate cancer Neg Hx  Prior to Admission medications   Medication Sig Start Date End Date Taking? Authorizing Provider  atorvastatin (LIPITOR) 40 MG tablet Take 1 tablet (40 mg total) by mouth daily. 04/25/22  Yes [name], Peter M, MD  ELIQUIS 5 MG TABS tablet TAKE 1 TABLET BY MOUTH TWICE A DAY 12/20/21  Yes Fenton Foy, NP  furosemide (LASIX) 20 MG tablet Take 1 tablet (20 mg total) by mouth daily. 04/25/22  Yes [name], Peter M, MD  losartan (COZAAR) 25 MG tablet Take 1 tablet (25 mg total)  by mouth daily. 04/25/22 04/20/23 Yes [name], Peter M, MD  metoprolol succinate (TOPROL XL) 25 MG 24 hr tablet Take 1 tablet (25 mg total) by mouth daily. 04/25/22  Yes [name], Peter M, MD  Blood Pressure Monitoring (BLOOD PRESSURE CUFF) MISC Check blood pressure daily between 8 pm -9 pm 09/13/16   [name], FNP    Physical Exam: Vitals:   05/18/22 0100 05/18/22 0140 05/18/22 0141 05/18/22 0145  BP: (!) 116/91 (!) 121/109  (!) 129/105  Pulse:      Resp: 18 (!) 25  (!) 25  Temp:      TempSrc:      SpO2:      Weight:   68 kg   Height:   '5\' 11"'$  (1.803 m)    Constitutional: NAD, calm, comfortable Eyes: PERRL, lids and conjunctivae normal ENMT: Mucous membranes are moist. Posterior pharynx clear of any exudate or lesions.Normal dentition.  Neck: normal, supple, no masses, no thyromegaly Respiratory: clear to auscultation bilaterally, no wheezing, no crackles. Normal respiratory effort. No accessory muscle use.  Cardiovascular: Tachycardic, no murmurs / rubs / gallops. 2+ BLE edema. 2+ pedal pulses. No carotid bruits.  Abdomen: no tenderness, no masses palpated. No hepatosplenomegaly. Bowel sounds positive.  Musculoskeletal: no clubbing / cyanosis. No joint deformity upper and lower extremities. Good ROM, no contractures. Normal muscle tone.  Skin: no rashes, lesions, ulcers. No induration Neurologic: CN 2-12 grossly intact. Sensation intact, DTR normal. Strength 5/5 in all 4.  Psychiatric: Normal judgment and insight. Alert and oriented x 3. Normal mood.   Data Reviewed:       Latest Ref Rng & Units 05/17/2022    2:08 PM 04/17/2022    1:42 PM 05/14/2021   10:01 AM  CBC  WBC 4.0 - 10.5 K/uL 5.3  7.4  6.6   Hemoglobin 13.0 - 17.0 g/dL 15.2  12.5  13.4   Hematocrit 39.0 - 52.0 % 46.8  37.6  40.8   Platelets 150 - 400 K/uL 83  229  237       Latest Ref Rng & Units 05/17/2022    2:08 PM 04/17/2022    1:42 PM 05/14/2021   10:01 AM  CMP  Glucose 70 - 99 mg/dL 99  123   100   BUN 8 - 23 mg/dL '25  29  20   '$ Creatinine 0.61 - 1.24 mg/dL 1.39  1.19  1.05   Sodium 135 - 145 mmol/L 140  137  142   Potassium 3.5 - 5.1 mmol/L 4.2  4.0  4.5   Chloride 98 - 111 mmol/L 102  102  101   CO2 22 - 32 mmol/L '21  21  22   '$ Calcium 8.9 - 10.3 mg/dL 9.1  8.8  9.7   Total Protein 6.5 - 8.1 g/dL 6.9  6.6  6.5   Total Bilirubin 0.3 - 1.2 mg/dL 3.2  2.6  1.5   Alkaline Phos 38 -  126 U/L 253  117  127   AST 15 - 41 U/L 132  234  254   ALT 0 - 44 U/L 141  298  359    CXR: IMPRESSION: 1. Prominent interstitial opacities bilaterally, which could represent pulmonary venous congestion or atypical infection. 2. Cardiomegaly.  BNP 3932  2d echo: IMPRESSIONS     1. Severe global reduction in LV function with large apical thrombus;  Compared to 09/27/19 LV function worse and apical thrombus new; pt sent to  ER by Dr Curt Bears.   2. Left ventricular ejection fraction, by estimation, is <20%. The left  ventricle has severely decreased function. The left ventricle demonstrates  global hypokinesis. The left ventricular internal cavity size was severely  dilated. Left ventricular  diastolic parameters are consistent with Grade III diastolic dysfunction  (restrictive).   3. Right ventricular systolic function is severely reduced. The right  ventricular size is moderately enlarged.   4. Left atrial size was severely dilated.   5. Right atrial size was severely dilated.   6. A small pericardial effusion is present.   7. The mitral valve is normal in structure. Mild mitral valve  regurgitation. No evidence of mitral stenosis.   8. The aortic valve is tricuspid. Aortic valve regurgitation is mild.  Aortic valve sclerosis is present, with no evidence of aortic valve  stenosis.   9. The inferior vena cava is dilated in size with <50% respiratory  variability, suggesting right atrial pressure of 15 mmHg.     Assessment and Plan: * Acute on chronic HFrEF (heart failure with reduced  ejection fraction) (HCC) Worsening EF due to ICM.  Now EF < 20% on todays echo with LV thrombus.  Also grade 3 DD too. CHF pathway Lasix '40mg'$  IV x1 in ED and '40mg'$  IV daily Tele monitor Just had TTE prior to coming in to ED today Strict intake and output Daily BMP Call cards in AM for consult, they sent him in from office ? Candidate for AICD ? Candidate for other advanced therapies  LV (left ventricular) mural thrombus Due to Non-adhearance to eliquis: pt admits to frequently missing large number of doses Therefore, I dont think that this represents a failure of DOAC therapy at this point. Heparin gtt for the moment Check INR  Transaminitis ? Hepatic congestion due to severe CHF? Infarct from embolus given mural thrombus? Not a fan of the fact that he's basically had painless Jaundice for past 1 year... Check hepatitis panel [place] in Jan unrevealing, but probably warrants repeat imaging at this point. Consider MRCP when he's more able to lay flat for it (too short of breath at the moment) May want to get GI involved as well.  Thrombocytopenia ([place]) Possibly due to platelets being consumed by the LV thrombus formation? Alternatively given 1 year h/o LFT elevations, maybe secondary to hypersplenism from portal venous hypertension?  Paroxysmal atrial fibrillation (HCC) PAF discovered on loop recorder (placed following cryptogenic stroke). Heparin gtt Supposed to be on chronic eliquis.  Essential hypertension Sounds like non-adherent to home BP meds previously per Dr. [name] 11/30 note. BP on soft side today and mild AKI, will hold off on restarting losartan for the moment. But try to restart BB if BP looks like it will tolerates in AM.      Advance Care Planning:   Code Status: Full Code  Consults: None, call cards for consult in AM.  Consider GI consult as well for transaminitis  Family Communication: No family  in room  Severity of Illness: The appropriate patient status  for this patient is OBSERVATION. Observation status is judged to be reasonable and necessary in order to provide the required intensity of service to ensure the patient's safety. The patient's presenting symptoms, physical exam findings, and initial radiographic and laboratory data in the context of their medical condition is felt to place them at decreased risk for further clinical deterioration. Furthermore, it is anticipated that the patient will be medically stable for discharge from the hospital within 2 midnights of admission.   Author: Etta Quill., DO 05/18/2022 2:00 AM  For on call review [email or web address].

## 2022-05-18 NOTE — Assessment & Plan Note (Addendum)
Possibly due to platelets being consumed by the LV thrombus formation? Alternatively given 1 year h/o LFT elevations, maybe secondary to hypersplenism from portal venous hypertension? -Monitoring closely on heparin drip

## 2022-05-18 NOTE — Consult Note (Signed)
Consultation Note Date: 05/18/2022   Patient Name: Gregory Perry  DOB: 02/27/1951  MRN: [id number]  Age / Sex: 71 y.o., male  PCP: Teena Dunk, NP Referring Physician: Deatra James, MD  Reason for Consultation: Establishing goals of care  HPI/Patient Profile: 71 y.o. male  with past medical history of  hypertension, stroke, CAD, PAF on Eliquis admitted on 05/17/2022 with EF of less than 20% on 2D echo in outpatient clinic.   Patient has not been adherent to Eliquis. He is now admitted with acute on chronic HFrEF with LV thrombus and grade 3 diastolic dysfunction.  PMT has been consulted to assist with goals of care conversation.  Clinical Assessment and Goals of Care:  I have reviewed medical records including EPIC notes, labs and imaging, received report from RN, assessed the patient and then met at the bedside to discuss diagnosis prognosis, GOC, EOL wishes, disposition and options.  I introduced Palliative Medicine as specialized medical care for people living with serious illness. It focuses on providing relief from the symptoms and stress of a serious illness. The goal is to improve quality of life for both the patient and the family.  We discussed a brief life review of the patient and then focused on their current illness.   I attempted to elicit values and goals of care important to the patient.    Medical History Review and Understanding:  We reviewed patient's end-stage heart failure and he states "I realize that 90% of this is my fault" in regards to his medication noncompliance.  Counseled that unfortunately at this point, the best case scenario is to maintain his current quality of life and he verbalizes understanding.  Social History: Patient lives with his brother and sister.  He is separated from his ex-wife for the past 30 to 40 years, however still legally married.  He reports  that he has a daughter but he is not certain if any other of his children are biologically his.  He moved from [place] to [place] for a while and then moved back.  Functional and Nutritional State: Patient reports his appetite is "very bad" and has been for "too long."  He states that he can ambulate only short distances around his home.  He does not use a walker.  Palliative Symptoms: Dyspnea, weakness  Advance Directives: A detailed discussion regarding advanced directives was had.  No documentation completed yet.  He is interested in filling out paperwork and will continue to reflect on who he would name as HCPOA.  Right now, he thinks possibly his nephew.  Code Status: Concepts specific to code status, artifical feeding and hydration, and rehospitalization were considered and discussed. Recommended consideration of DNR status, understanding evidenced-based poor outcomes in similar hospitalized patients, as the cause of the arrest is likely associated with chronic/terminal disease rather than a reversible acute cardio-pulmonary event.  He would like to remain a full code at this point.  Discussion: Patient understands the relationship between his poor compliance and progressive heart failure.  His goal at this time is to "get better" and to be able to look back without regrets about his actions after he learned of his disease progression.  He wants the opportunity to be more compliant to medication recommendations and is interested in hearing what options he still has.  He also wants a chance to mend his relationship with his daughter and be more involved in the lives of his grandchildren.  He is not ready for EOL.  Emotional support and therapeutic listening was provided as he shared the challenges he has faced and the difficulty has had understanding when his medicines have been adjusted and why.  He realizes that this was not a good reason to omit them altogether.  He finds his  current quality of life acceptable and understands that there are likely no interventions available to improve his current functioning.  He still wants to try his best.  Offered to call family members to participate in goals of care conversations moving forward for additional support.  He prefers to wait a couple days after the holiday and find out more information before involving them.   Discussed the importance of continued conversation with family and the medical providers regarding overall plan of care and treatment options, ensuring decisions are within the context of the patient's values and GOCs.   Questions and concerns were addressed.  Hard Choices booklet left for review. The family was encouraged to call with questions or concerns.  PMT will continue to support holistically.   SUMMARY OF RECOMMENDATIONS   -Continue full code for now Continue full scope treatment -Patient's goal is to adhere to medications and improve as much as possible so that he can work on his relationship with his daughter -Ongoing goals of care conversations -Patient is interested in completing AD documentation as able during this admission -Psychosocial and emotional support provided -PMT will continue to follow and support   Prognosis:  Hospice appropriate if aligned with patient's goals of care, he is not emotionally there yet  Discharge Planning: To Be Determined      Primary Diagnoses: Present on Admission:  Cardiomyopathy, ischemic  Essential hypertension  Transaminitis  Paroxysmal atrial fibrillation (HCC)  Acute on chronic HFrEF (heart failure with reduced ejection fraction) (HCC)  LV (left ventricular) mural thrombus  Thrombocytopenia (HCC)  (Resolved) Mural thrombus of left ventricle   Physical Exam Vitals and nursing note reviewed.  Constitutional:      General: He is not in acute distress.    Appearance: He is ill-appearing.  Cardiovascular:     Rate and Rhythm: Tachycardia  present.  Pulmonary:     Effort: Tachypnea present.  Neurological:     Mental Status: He is alert and oriented to person, place, and time.  Psychiatric:        Mood and Affect: Mood normal.        Thought Content: Thought content normal.     Vital Signs: BP (!) 113/90 (BP Location: Left Arm)   Pulse (!) 120   Temp (!) 97.2 F (36.2 C) (Oral)   Resp (!) 22   Ht _0  (1.803 m)   Wt 68 kg Comment: estimate  SpO2 100%   BMI 20.92 kg/m  Pain Scale: 0-10   Pain Score: 0-No pain   SpO2: SpO2: 100 % O2 Device:SpO2: 100 % O2 Flow Rate: .    Palliative Assessment/Data: 50% at best     MDM: High    Daxon Kyne Johnnette Litter, PA-C  Palliative Medicine Team Team phone # [phone]  Thank you for allowing the Palliative Medicine Team to assist in the care of this patient. Please utilize secure chat with additional questions, if there is no response within 30 minutes please call the above phone number.  Palliative Medicine Team providers are available by phone from 7am to 7pm daily and can be reached through the team cell phone.  Should this patient require assistance outside of these hours, please call the patient's attending  physician.

## 2022-05-18 NOTE — Assessment & Plan Note (Addendum)
Sounds like non-adherent to home BP meds previously per Dr. [name] 11/30 note. BP on soft side today and mild AKI, will hold off on restarting losartan for the moment. But holding beta-blockers due to low EF BP remained stable today

## 2022-05-18 NOTE — ED Notes (Addendum)
Pt placed in 48m condom cath for urine elimination into suctioned canister

## 2022-05-18 NOTE — Hospital Course (Signed)
Gregory Perry is a 71 y.o. male with medical history significant of HTN, stroke, CAD, PAF on eliquis.   Was taking eliquis, but not adherent to any other meds as of Dr. Doug Sou office note 11/30.   Pt sent in to ED today from cards office after 2d echo showed worsened EF < 20% and L apical mural thrombus.   He has been having shortness of breath for several months with symptoms waxing and waning but no recent worsening. He has chronic edema which is also worsening. He does have some intermittent chest discomfort.     CXR: IMPRESSION: 1. Prominent interstitial opacities bilaterally, which could represent pulmonary venous congestion or atypical infection. 2. Cardiomegaly.   BNP 3932   2D Echo: IMPRESSIONS    1. Severe global reduction in LV function with large apical thrombus;  Compared to 09/27/19 LV function worse and apical thrombus new; pt sent to  ER by Dr Curt Bears.   2. Left ventricular ejection fraction, by estimation, is <20%. The left  ventricle has severely decreased function. The left ventricle demonstrates  global hypokinesis. The left ventricular internal cavity size was severely  dilated. Left ventricular  diastolic parameters are consistent with Grade III diastolic dysfunction  (restrictive).   3. Right ventricular systolic function is severely reduced. The right  ventricular size is moderately enlarged.   4. Left atrial size was severely dilated.   5. Right atrial size was severely dilated.   6. A small pericardial effusion is present.   7. The mitral valve is normal in structure. Mild mitral valve  regurgitation. No evidence of mitral stenosis.   8. The aortic valve is tricuspid.

## 2022-05-18 NOTE — Progress Notes (Addendum)
ANTICOAGULATION CONSULT NOTE  Pharmacy Consult for heparin Indication:  apical thrombus and atrial fibrillation  No Known Allergies  Patient Measurements: Height: '5\' 11"'$  (180.3 cm) Weight: 68 kg (150 lb) (estimate) IBW/kg (Calculated) : 75.3  Vital Signs: Temp: 97.2 F (36.2 C) (12/23 1020) Temp Source: Oral (12/23 1020) BP: 113/90 (12/23 1020) Pulse Rate: 120 (12/23 1020)  Labs: Recent Labs    05/17/22 1408 05/18/22 0138 05/18/22 0424 05/18/22 0530 05/18/22 1201 05/18/22 1202  HGB 15.2  --   --  14.8  --   --   HCT 46.8  --   --  43.5  --   --   PLT 83*  --   --  88*  --   --   APTT  --   --   --   --  123*  --   LABPROT  --  18.0*  --   --   --   --   INR  --  1.5*  --   --   --   --   HEPARINUNFRC  --   --   --   --   --  0.36  CREATININE 1.39*  --   --   --   --   --   TROPONINIHS  --  9 24*  --   --   --      Estimated Creatinine Clearance: 48.2 mL/min (A) (by C-G formula based on SCr of 1.39 mg/dL (H)).   Medical History: Past Medical History:  Diagnosis Date   Alcohol abuse    Candida esophagitis (HCC)    Cardiomyopathy (HCC)    CHF (congestive heart failure) (HCC)    Coronary artery disease    CVA (cerebral infarction)    Duodenal ulcer    Dysphagia    Headache(784.0)    history of   Hypertension    Prostate cancer ([place]) 12/2018   Severe protein-calorie malnutrition ([place])    Stroke ([place]) 09/25/2019   Vitamin D deficiency 09/2019    Assessment: 71yo male was sent to ED from outpatient echo where a large apical clot was discovered despite Eliquis for Afib (pt admits to missing doses), last dose taken 12/22 pm   Anti-Xa level 0.36 which is not consistent with a recent dose of Eliquis, aPTT is 123 seconds (supratherapeutic).  These levels are not correlating however will adjust based off of the aPTT level for now  Goal of Therapy:  Heparin level 0.3-0.7 units/ml aPTT 66-102 seconds Monitor platelets by anticoagulation protocol: Yes   Plan:   Decrease heparin gtt to 1100 units/hr F/u 6 hour aPTT/HL  Addendum: Consulted to switch to Xarelto 12/24 AM Tomorrow plan switch from heparin gtt to Xarelto '20mg'$  PO once daily  Bertis Ruddy, PharmD, [facility] Clinical Pharmacist ED Pharmacist Phone # [phone] 05/18/2022 1:04 PM

## 2022-05-18 NOTE — Assessment & Plan Note (Addendum)
PAF discovered on loop recorder (placed following cryptogenic stroke). Heparin gtt Supposed to be on chronic eliquis .Marland Kitchen  Initiating Eliquis in the next 48 hours Mild thrombocytopenia-may be at increased risk of bleeding

## 2022-05-18 NOTE — Progress Notes (Signed)
ANTICOAGULATION CONSULT NOTE  Pharmacy Consult for heparin>xarelto Indication:  apical thrombus and atrial fibrillation  No Known Allergies  Patient Measurements: Height: '5\' 11"'$  (180.3 cm) Weight: 68 kg (150 lb) (estimate) IBW/kg (Calculated) : 75.3  Vital Signs: Temp: 97.6 F (36.4 C) (12/23 1918) Temp Source: Oral (12/23 1918) BP: 118/100 (12/23 1918) Pulse Rate: 86 (12/23 1918)  Labs: Recent Labs    05/17/22 1408 05/18/22 0138 05/18/22 0424 05/18/22 0530 05/18/22 1201 05/18/22 1202 05/18/22 1859  HGB 15.2  --   --  14.8  --   --   --   HCT 46.8  --   --  43.5  --   --   --   PLT 83*  --   --  88*  --   --   --   APTT  --   --   --   --  123*  --  100*  LABPROT  --  18.0*  --   --   --   --   --   INR  --  1.5*  --   --   --   --   --   HEPARINUNFRC  --   --   --   --   --  0.36 0.27*  CREATININE 1.39*  --   --   --   --   --   --   TROPONINIHS  --  9 24*  --   --   --   --      Estimated Creatinine Clearance: 48.2 mL/min (A) (by C-G formula based on SCr of 1.39 mg/dL (H)).   Medical History: Past Medical History:  Diagnosis Date   Alcohol abuse    Candida esophagitis (HCC)    Cardiomyopathy (HCC)    CHF (congestive heart failure) (HCC)    Coronary artery disease    CVA (cerebral infarction)    Duodenal ulcer    Dysphagia    Headache(784.0)    history of   Hypertension    Prostate cancer ([place]) 12/2018   Severe protein-calorie malnutrition ([place])    Stroke ([place]) 09/25/2019   Vitamin D deficiency 09/2019    Assessment: 71yo male was sent to ED from outpatient echo where a large apical clot was discovered despite Eliquis for Afib (pt admits to missing doses), last dose taken 12/22 pm   PTT came back at 100, HL 0.27. Prob correlating. Plan to transition to Xarelto in AM for once daily dosing for compliance.   Goal of Therapy:  Heparin level 0.3-0.7 units/ml aPTT 66-102 seconds Monitor platelets by anticoagulation protocol: Yes   Plan:  Cont  heparin at 1100 units/hr until 0800 then Xarelto '20mg'$  PO qday with breakfast  Onnie Boer, PharmD, [place], AAHIVP, CPP Infectious Disease Pharmacist 05/18/2022 9:02 PM

## 2022-05-18 NOTE — Progress Notes (Signed)
PROGRESS NOTE    Patient: Gregory Perry                            PCP: Bo Merino I, NP                    DOB: [birth year]            DOA: 05/17/2022 KDX:[id number]             DOS: 05/18/2022, 1:39 PM   LOS: 0 days   Date of Service: The patient was seen and examined on 05/18/2022  Subjective:   The patient was seen and examined this morning. Hemodynamically stable, awake alert oriented, following command, pleasant Reporting he has not been compliant with his medications Denies of having any recent illnesses.  Reporting of generalized weakness, progressive shortness of breath  Brief Narrative:   Gregory Perry is a 71 y.o. male with medical history significant of HTN, stroke, CAD, PAF on eliquis.   Was taking eliquis, but not adherent to any other meds as of Dr. Doug Sou office note 11/30.   Pt sent in to ED today from cards office after 2d echo showed worsened EF < 20% and L apical mural thrombus.   He has been having shortness of breath for several months with symptoms waxing and waning but no recent worsening. He has chronic edema which is also worsening. He does have some intermittent chest discomfort.     CXR: IMPRESSION: 1. Prominent interstitial opacities bilaterally, which could represent pulmonary venous congestion or atypical infection. 2. Cardiomegaly.   BNP 3932   2D Echo: IMPRESSIONS    1. Severe global reduction in LV function with large apical thrombus;  Compared to 09/27/19 LV function worse and apical thrombus new; pt sent to  ER by Dr Curt Bears.   2. Left ventricular ejection fraction, by estimation, is <20%. The left  ventricle has severely decreased function. The left ventricle demonstrates  global hypokinesis. The left ventricular internal cavity size was severely  dilated. Left ventricular  diastolic parameters are consistent with Grade III diastolic dysfunction  (restrictive).   3. Right ventricular systolic function is severely reduced.  The right  ventricular size is moderately enlarged.   4. Left atrial size was severely dilated.   5. Right atrial size was severely dilated.   6. A small pericardial effusion is present.   7. The mitral valve is normal in structure. Mild mitral valve  regurgitation. No evidence of mitral stenosis.   8. The aortic valve is tricuspid.     Assessment & Plan:   Principal Problem:   Acute on chronic HFrEF (heart failure with reduced ejection fraction) (HCC) Active Problems:   LV (left ventricular) mural thrombus   Transaminitis   Essential hypertension   Cardiomyopathy, ischemic   Paroxysmal atrial fibrillation (HCC)   Thrombocytopenia (HCC)     Assessment and Plan: * Acute on chronic HFrEF (heart failure with reduced ejection fraction) ([place]) -Continue to complain of lower extremity edema, progressive shortness of breath especially with minimal exertion - Worsening EF due to ICM.  Now EF < 20% on todays echo with LV thrombus.  Also grade 3 DD too. -Continue aggressive diuresis per CHF pathway -Currently on Lasix 40 mg IV daily -Monitoring I's and O's, daily weight, telemetry monitoring -Continue heparin drip  -Per cardiology planning to switch Eliquis to once a day Xarelto-given compliance issue  -Cardiology consulted-appreciate further evaluation and  recommendation -Holding ARB due to elevated creatinine, holding beta-blocker due to low output, - ?  Need for TEE, cath, AICD   LV (left ventricular) mural thrombus Due to Non-adhearance to eliquis: Per cardiology considering switching to Xarelto once a day therefore within the next 48 hours  We dont think that this represents a failure of DOAC therapy at this point. Heparin gtt for the moment Check INR  Transaminitis ? Hepatic congestion due to severe CHF? Infarct from embolus given mural thrombus? Not a fan of the fact that he's basically had painless Jaundice for past 1 year... Check hepatitis panel [place] in Jan  unrevealing, but probably warrants repeat imaging at this point. Consider MRCP when he's more able to lay flat for it (too short of breath at the moment) May want to get GI involved as well.  Thrombocytopenia ([place]) Possibly due to platelets being consumed by the LV thrombus formation? Alternatively given 1 year h/o LFT elevations, maybe secondary to hypersplenism from portal venous hypertension? -Monitoring closely on heparin drip  Paroxysmal atrial fibrillation (HCC) PAF discovered on loop recorder (placed following cryptogenic stroke). Heparin gtt Supposed to be on chronic eliquis .Marland Kitchen  Initiating Eliquis in the next 48 hours Mild thrombocytopenia-may be at increased risk of bleeding  Cardiomyopathy, ischemic -Currently stable, continue management as above, IV Lasix, -At this point we will holding ARB due to elevated creatinine, beta-blockers due to low EF,  Essential hypertension Sounds like non-adherent to home BP meds previously per Dr. [name] 11/30 note. BP on soft side today and mild AKI, will hold off on restarting losartan for the moment. But holding beta-blockers due to low EF BP remained stable today       ----------------------------------------------------------------------------------------------------------------------------------------------- Nutritional status:  The patient's BMI is: Body mass index is 20.92 kg/m. I agree with the assessment and plan as outlined ------------------------------------------------------------------------------------------------------------------------------------  DVT prophylaxis:  Heparin drip   Code Status:   Code Status: Full Code  Family Communication: 2 family members present at bedside, updated The above findings and plan of care has been discussed with patient (and family)  in detail,  they expressed understanding and agreement of above. -Advance care planning has been discussed.   Admission status:   Status is:  Inpatient Remains inpatient appropriate because: Needing IV heparin, further evaluation for apical thrombus     Procedures:   No admission procedures for hospital encounter.   Antimicrobials:  Anti-infectives (From admission, onward)    None        Medication:   atorvastatin  40 mg Oral Daily   furosemide  40 mg Intravenous Daily   metoprolol succinate  25 mg Oral Daily   sodium chloride flush  3 mL Intravenous Q12H    sodium chloride, acetaminophen, ondansetron (ZOFRAN) IV, sodium chloride flush   Objective:   Vitals:   05/18/22 0600 05/18/22 0630 05/18/22 0842 05/18/22 1020  BP: 116/85 (!) 119/91  (!) 113/90  Pulse:  60  (!) 120  Resp: 20 17  (!) 22  Temp:   (!) 97.2 F (36.2 C) (!) 97.2 F (36.2 C)  TempSrc:   Oral Oral  SpO2: 95% 95%  100%  Weight:      Height:        Intake/Output Summary (Last 24 hours) at 05/18/2022 1339 Last data filed at 05/18/2022 0936 Gross per 24 hour  Intake 203 ml  Output 2200 ml  Net -1997 ml   Filed Weights   05/18/22 0141  Weight: 68 kg  Examination:   Physical Exam  Constitution:  Alert, cooperative, no distress,  Appears calm and comfortable  Psychiatric:   Normal and stable mood and affect, cognition intact,   HEENT:        Normocephalic, PERRL, otherwise with in Normal limits  Chest:         Chest symmetric Cardio vascular:  S1/S2, RRR, No murmure, No Rubs or Gallops  pulmonary: Clear to auscultation bilaterally, respirations unlabored, negative wheezes / crackles Abdomen: Soft, non-tender, non-distended, bowel sounds,no masses, no organomegaly Muscular skeletal: Limited exam - in bed, able to move all 4 extremities,   Neuro: CNII-XII intact. , normal motor and sensation, reflexes intact  Extremities: +++pitting edema lower extremities, +2 pulses  Skin: Dry, warm to touch, negative for any Rashes, No open wounds Wounds: per nursing  documentation   ------------------------------------------------------------------------------------------------------------------------------------------    LABs:     Latest Ref Rng & Units 05/18/2022    5:30 AM 05/17/2022    2:08 PM 04/17/2022    1:42 PM  CBC  WBC 4.0 - 10.5 K/uL 6.2  5.3  7.4   Hemoglobin 13.0 - 17.0 g/dL 14.8  15.2  12.5   Hematocrit 39.0 - 52.0 % 43.5  46.8  37.6   Platelets 150 - 400 K/uL 88  83  229       Latest Ref Rng & Units 05/18/2022    2:33 AM 05/17/2022    2:08 PM 04/17/2022    1:42 PM  CMP  Glucose 70 - 99 mg/dL  99  123   BUN 8 - 23 mg/dL  25  29   Creatinine 0.61 - 1.24 mg/dL  1.39  1.19   Sodium 135 - 145 mmol/L  140  137   Potassium 3.5 - 5.1 mmol/L  4.2  4.0   Chloride 98 - 111 mmol/L  102  102   CO2 22 - 32 mmol/L  21  21   Calcium 8.9 - 10.3 mg/dL  9.1  8.8   Total Protein 6.5 - 8.1 g/dL <3.0  6.9  6.6   Total Bilirubin 0.3 - 1.2 mg/dL 0.5  3.2  2.6   Alkaline Phos 38 - 126 U/L 55  253  117   AST 15 - 41 U/L 31  132  234   ALT 0 - 44 U/L 35  141  298        Micro Results No results found for this or any previous visit (from the past 240 hour(s)).  Radiology Reports CT Head Wo Contrast  Result Date: 05/18/2022 CLINICAL DATA:  Fall from bed, on blood thinners. EXAM: CT HEAD WITHOUT CONTRAST TECHNIQUE: Contiguous axial images were obtained from the base of the skull through the vertex without intravenous contrast. RADIATION DOSE REDUCTION: This exam was performed according to the departmental dose-optimization program which includes automated exposure control, adjustment of the mA and/or kV according to patient size and/or use of iterative reconstruction technique. COMPARISON:  09/26/2019 brain MRI FINDINGS: Brain: No evidence of acute infarction, hemorrhage, hydrocephalus, extra-axial collection or mass lesion/mass effect. Chronic bilateral occipital (left more than right), posterior right frontal, and bilateral cerebellar  infarcts. Vascular: No hyperdense vessel or unexpected calcification. Skull: Normal. Negative for fracture or focal lesion. Sinuses/Orbits: No visible injury IMPRESSION: 1. No evidence of intracranial injury. 2. Multiple chronic infarcts including interval small right occipital cortex infarct since a 2021 comparison. Electronically Signed   By: Jorje Guild M.D.   On: 05/18/2022 04:24   DG Chest 2  View  Result Date: 05/17/2022 CLINICAL DATA:  SOB EXAM: CHEST - 2 VIEW COMPARISON:  04/17/22 FINDINGS: Cardiomegaly. No pleural effusion. No pneumothorax. No focal airspace opacity. Loop recorder device in place. Prominent interstitial opacities bilaterally, which could represent pulmonary venous congestion or atypical infection. No displaced rib fractures. Visualized upper abdomen is unremarkable. IMPRESSION: 1. Prominent interstitial opacities bilaterally, which could represent pulmonary venous congestion or atypical infection. 2. Cardiomegaly. Electronically Signed   By: Marin Roberts M.D.   On: 05/17/2022 14:35    SIGNED: Deatra James, MD, FHM. [facility],  Pager (please use [email or web address] to page/text) Please use Epic Secure Chat for non-urgent communication (7AM-7PM)  If 7PM-7AM, please contact night-coverage [email or web address], 05/18/2022, 1:39 PM

## 2022-05-18 NOTE — Assessment & Plan Note (Addendum)
Net -2.3 L yesterday, 12 L on admission.  Creatinine stable. -Continue IV Lasix - Consult cardiology, appreciate recommendations - Continue carvedilol, digoxin, Jardiance, losartan, spironolactone -Daily BMP, strict ins and outs

## 2022-05-18 NOTE — Assessment & Plan Note (Addendum)
See above

## 2022-05-18 NOTE — Consult Note (Addendum)
Advanced Heart Failure Team Consult Note   Primary Physician: Teena Dunk, NP PCP-Cardiologist:  Cristopher Peru, MD  Reason for Consultation: Heart failure  HPI:    Gregory Perry is seen today for evaluation of heart failure at the request of Dr. Johnsie Cancel .   71 y/o with h/o CVA, PAF, chronic systolic HF due to NICM, ETOH/tobacco use and non-compliance  Had CVA in 2017 and ILR showed AF. Echo 6/17  EF 35-40% Cath in 8/17 with non-obstructive CAD except for 90% in RCA acute marginal.   Echo 2018 EF 30-35%  Echo 5/21 EF 40-45%  Was lost to f/u. Recently seen in ED with HF symptoms. Lasix started and referred back to cardiology as outpatient. He was taking Eliquis at the time but no other meds. GDMT started. Referred for repeat echo  Echo 05/17/22 EF 10% RV severely decreased with large apical thrombus. Sent to ED.   Labs on admission BNP 3385, K 4.0, BUN 29 Cr 1.19 AST 132, ALT 141 TB 3.2 and Troponin negative. Bicarb 21. Albumin < 1.5 Total protein < 3.0   Received IV lasix SCr 1.19 -> 1.39. LFT have normalized  BNP down to 2,203  Lives at home with sister and 2 brothers. Struggles with ADLs. SOB comes and goes. No CP. Stopped ETOH 2 years ago. Stopped smoking about 4 months ago. Smokes some THC. Doesn't take meds.   In room he is very weak. Barely able to pick up phone.    Review of Systems: [y] = yes, '[ ]'$  = no   General: Weight gain '[ ]'$ ; Weight loss [ y]; Anorexia Blue.Reese ]; Fatigue [ y]; Fever '[ ]'$ ; Chills '[ ]'$ ; Weakness '[ ]'$   Cardiac: Chest pain/pressure '[ ]'$ ; Resting SOB [ y]; Exertional SOB Blue.Reese ]; Orthopnea '[ ]'$ ; Pedal Edema [ y]; Palpitations '[ ]'$ ; Syncope '[ ]'$ ; Presyncope '[ ]'$ ; Paroxysmal nocturnal dyspnea'[ ]'$   Pulmonary: Cough '[ ]'$ ; Wheezing'[ ]'$ ; Hemoptysis'[ ]'$ ; Sputum '[ ]'$ ; Snoring '[ ]'$   GI: Vomiting'[ ]'$ ; Dysphagia'[ ]'$ ; Melena'[ ]'$ ; Hematochezia '[ ]'$ ; Heartburn'[ ]'$ ; Abdominal pain '[ ]'$ ; Constipation '[ ]'$ ; Diarrhea '[ ]'$ ; BRBPR '[ ]'$   GU: Hematuria'[ ]'$ ; Dysuria '[ ]'$ ; Nocturia'[ ]'$   Vascular:  Pain in legs with walking '[ ]'$ ; Pain in feet with lying flat '[ ]'$ ; Non-healing sores '[ ]'$ ; Stroke '[ ]'$ ; TIA '[ ]'$ ; Slurred speech '[ ]'$ ;  Neuro: Headaches'[ ]'$ ; Vertigo'[ ]'$ ; Seizures'[ ]'$ ; Paresthesias'[ ]'$ ;Blurred vision '[ ]'$ ; Diplopia '[ ]'$ ; Vision changes '[ ]'$   Ortho/Skin: Arthritis [ y]; Joint pain [ y]; Muscle pain '[ ]'$ ; Joint swelling '[ ]'$ ; Back Pain '[ ]'$ ; Rash '[ ]'$   Psych: Depression'[ ]'$ ; Anxiety'[ ]'$   Heme: Bleeding problems '[ ]'$ ; Clotting disorders '[ ]'$ ; Anemia '[ ]'$   Endocrine: Diabetes '[ ]'$ ; Thyroid dysfunction'[ ]'$   Home Medications Prior to Admission medications   Medication Sig Start Date End Date Taking? Authorizing Provider  atorvastatin (LIPITOR) 40 MG tablet Take 1 tablet (40 mg total) by mouth daily. 04/25/22  Yes [name], Peter M, MD  ELIQUIS 5 MG TABS tablet TAKE 1 TABLET BY MOUTH TWICE A DAY 12/20/21  Yes Fenton Foy, NP  furosemide (LASIX) 20 MG tablet Take 1 tablet (20 mg total) by mouth daily. 04/25/22  Yes [name], Peter M, MD  losartan (COZAAR) 25 MG tablet Take 1 tablet (25 mg total) by mouth daily. 04/25/22 04/20/23 Yes [name], Peter M, MD  metoprolol succinate (TOPROL XL) 25 MG 24 hr tablet Take 1 tablet (  25 mg total) by mouth daily. 04/25/22  Yes [name], Peter M, MD  Blood Pressure Monitoring (BLOOD PRESSURE CUFF) MISC Check blood pressure daily between 8 pm -9 pm 09/13/16   [name], FNP    Past Medical History: Past Medical History:  Diagnosis Date   Alcohol abuse    Candida esophagitis (HCC)    Cardiomyopathy ([place])    CHF (congestive heart failure) ([place])    Coronary artery disease    CVA (cerebral infarction)    Duodenal ulcer    Dysphagia    Headache(784.0)    history of   Hypertension    Prostate cancer ([place]) 12/2018   Severe protein-calorie malnutrition ([place])    Stroke ([place]) 09/25/2019   Vitamin D deficiency 09/2019    Past Surgical History: Past Surgical History:  Procedure Laterality Date   BALLOON DILATION N/A 11/22/2015   Procedure: BALLOON DILATION;   Surgeon: Mauri Pole, MD;  Location: [facility];  Service: Endoscopy;  Laterality: N/A;   CARDIAC CATHETERIZATION N/A 01/17/2016   Procedure: Left Heart Cath and Coronary Angiography;  Surgeon: Sherren Mocha, MD;  Location: [facility];  Service: Cardiovascular;  Laterality: N/A;   DIRECT LARYNGOSCOPY  05/07/2011   Procedure: DIRECT LARYNGOSCOPY;  Surgeon: Rozetta Nunnery, MD;  Location: [facility];  Service: ENT;  Laterality: N/A;  ESOPHAGOSCOPY  WITH DILATION   EP IMPLANTABLE DEVICE N/A 11/17/2015   Procedure: Loop Recorder Insertion;  Surgeon: Evans Lance, MD;  Location: [facility];  Service: Cardiovascular;  Laterality: N/A;   ESOPHAGOGASTRODUODENOSCOPY  04/29/2011   Procedure: ESOPHAGOGASTRODUODENOSCOPY (EGD);  Surgeon: Winfield Cunas., MD;  Location: [facility];  Service: Endoscopy;  Laterality: N/A;   ESOPHAGOGASTRODUODENOSCOPY N/A 11/22/2015   Procedure: ESOPHAGOGASTRODUODENOSCOPY (EGD);  Surgeon: Mauri Pole, MD;  Location: [facility];  Service: Endoscopy;  Laterality: N/A;   IR CT HEAD LTD  09/26/2019   IR PERCUTANEOUS ART THROMBECTOMY/INFUSION INTRACRANIAL INC DIAG ANGIO  09/26/2019   IR US GUIDE VASC ACCESS RIGHT  09/26/2019   LOOP RECORDER IMPLANT     RADIOLOGY WITH ANESTHESIA N/A 09/26/2019   Procedure: IR WITH ANESTHESIA;  Surgeon: Luanne Bras, MD;  Location: [place];  Service: Radiology;  Laterality: N/A;    Family History: Family History  Problem Relation Age of Onset   Heart attack Maternal Grandfather    Colon cancer Neg Hx    Breast cancer Neg Hx    Prostate cancer Neg Hx     Social History: Social History   Socioeconomic History   Marital status: Legally Separated    Spouse name: Not on file   Number of children: 1   Years of education: Not on file   Highest education level: Not on file  Occupational History    Comment: retired  Tobacco Use   Smoking status: Every Day    Packs/day: 0.50    Years: 30.00    Total pack  years: 15.00    Types: Cigarettes   Smokeless tobacco: Never   Tobacco comments:    Pt smokes 6 to 7 a day trying to quit  Vaping Use   Vaping Use: Never used  Substance and Sexual Activity   Alcohol use: No    Alcohol/week: 3.0 standard drinks of alcohol    Types: 3 Shots of liquor per week    Comment: quit in 10/2015    Drug use: Yes    Types: Marijuana    Comment: occ   Sexual activity: Not  Currently  Other Topics Concern   Not on file  Social History Narrative   Not on file   Social Determinants of Health   Financial Resource Strain: Not on file  Food Insecurity: Not on file  Transportation Needs: Not on file  Physical Activity: Not on file  Stress: Not on file  Social Connections: Not on file    Allergies:  No Known Allergies  Objective:    Vital Signs:   Temp:  [97.2 F (36.2 C)-98 F (36.7 C)] 97.8 F (36.6 C) (12/23 1551) Pulse Rate:  [53-120] 98 (12/23 1551) Resp:  [16-25] 20 (12/23 1551) BP: (113-141)/(85-109) 124/100 (12/23 1551) SpO2:  [82 %-100 %] 100 % (12/23 1551) Weight:  [68 kg] 68 kg (12/23 0141) Last BM Date : 05/18/22  Weight change: Filed Weights   05/18/22 0141  Weight: 68 kg    Intake/Output:   Intake/Output Summary (Last 24 hours) at 05/18/2022 1610 Last data filed at 05/18/2022 0936 Gross per 24 hour  Intake 203 ml  Output 2200 ml  Net -1997 ml      Physical Exam    General:  Cachetic Chronically-ill appearing. No resp difficulty HEENT: normal poor dentition  Neck: supple. JVP to jaw . Carotids 2+ bilat; no bruits. No lymphadenopathy or thyromegaly appreciated. Cor: PMI laterally displaced. Regular tachy + s3 Lungs: clear Abdomen: soft, nontender, nondistended. No hepatosplenomegaly. No bruits or masses. Good bowel sounds. Extremities: no cyanosis, clubbing, rash, 2+ edema Neuro: alert & orientedx3, cranial nerves grossly intact. moves all 4 extremities w/o difficulty. Affect pleasant   Telemetry   Sinus 100-110  5-10 PVCs per minute Personally reviewed  EKG    Sinus tach 112 nonspecific ST-T abnormalities Personally reviewed  Labs   Basic Metabolic Panel: Recent Labs  Lab 05/17/22 1408  NA 140  K 4.2  CL 102  CO2 21*  GLUCOSE 99  BUN 25*  CREATININE 1.39*  CALCIUM 9.1    Liver Function Tests: Recent Labs  Lab 05/17/22 1408 05/18/22 0233  AST 132* 31  ALT 141* 35  ALKPHOS 253* 55  BILITOT 3.2* 0.5  PROT 6.9 <3.0*  ALBUMIN 3.1* <1.5*   No results for input(s): "LIPASE", "AMYLASE" in the last 168 hours. No results for input(s): "AMMONIA" in the last 168 hours.  CBC: Recent Labs  Lab 05/17/22 1408 05/18/22 0530  WBC 5.3 6.2  NEUTROABS 3.8  --   HGB 15.2 14.8  HCT 46.8 43.5  MCV 96.9 94.2  PLT 83* 88*    Cardiac Enzymes: No results for input(s): "CKTOTAL", "CKMB", "CKMBINDEX", "TROPONINI" in the last 168 hours.  BNP: BNP (last 3 results) Recent Labs    04/17/22 1342 05/17/22 1408 05/18/22 0810  BNP 3,385.5* 3,932.0* 2,203.3*    ProBNP (last 3 results) No results for input(s): "PROBNP" in the last 8760 hours.   CBG: No results for input(s): "GLUCAP" in the last 168 hours.  Coagulation Studies: Recent Labs    05/18/22 0138  LABPROT 18.0*  INR 1.5*     Imaging   CT Head Wo Contrast  Result Date: 05/18/2022 CLINICAL DATA:  Fall from bed, on blood thinners. EXAM: CT HEAD WITHOUT CONTRAST TECHNIQUE: Contiguous axial images were obtained from the base of the skull through the vertex without intravenous contrast. RADIATION DOSE REDUCTION: This exam was performed according to the departmental dose-optimization program which includes automated exposure control, adjustment of the mA and/or kV according to patient size and/or use of iterative reconstruction technique. COMPARISON:  09/26/2019 brain  MRI FINDINGS: Brain: No evidence of acute infarction, hemorrhage, hydrocephalus, extra-axial collection or mass lesion/mass effect. Chronic bilateral occipital  (left more than right), posterior right frontal, and bilateral cerebellar infarcts. Vascular: No hyperdense vessel or unexpected calcification. Skull: Normal. Negative for fracture or focal lesion. Sinuses/Orbits: No visible injury IMPRESSION: 1. No evidence of intracranial injury. 2. Multiple chronic infarcts including interval small right occipital cortex infarct since a 2021 comparison. Electronically Signed   By: Jorje Guild M.D.   On: 05/18/2022 04:24     Medications:     Current Medications:  atorvastatin  40 mg Oral Daily   furosemide  40 mg Intravenous Daily   metoprolol succinate  25 mg Oral Daily   sodium chloride flush  3 mL Intravenous Q12H    Infusions:  sodium chloride     heparin 1,100 Units/hr (05/18/22 1309)     Assessment/Plan   1. Acute on chronic systolic HF with biventricular failure, suspect end-stage - Echo 2017 EF 30-35%  - Cath 8/17 non-obstructive CAD - echo 5/21 EF 40-45% - Echo 05/17/22 EF 10% RV severely decreased - He is truly end-stage with severe malnutrition and noncompliance - Currently volume overloaded - Will continue IV lasix  - Add spiro 12.5 and digoxin 0.125 (stop if renal function worsening) - I do not think he is a candidate for advanced therapies or inotropic support. Would suggest Palliative consult for GOC (I see that consult had already been placed)  2. Large LV apical thrombus - on heparin   3. PAF - in NSR.  - Previously on Elquis   4. PVCs - about 5-10/min - doubt primary factor in CM  5. Polysubstance abuse (ETOH, tobacco) - now quit  6. H/o CVA X 2  - continue AC  7. Non-compliance   8. Severe protein-calorie malnutrition - albumin < 1.5, Protein < 3.0  - consider dietary consult   Length of Stay: 0  Glori Bickers, MD  05/18/2022, 4:10 PM  Advanced Heart Failure Team Pager [phone] (M-F; 7a - 5p)  Please contact [facility] for night-coverage after hours (4p -7a ) and weekends on [email or web address]

## 2022-05-18 NOTE — Consult Note (Signed)
CARDIOLOGY CONSULT NOTE       Patient ID: Gregory Perry MRN: [id number] DOB/AGE: 08/20/1950 71 y.o.  Admit date: 05/17/2022 Referring Physician: Alcario Drought Primary Physician: Bo Merino I, NP Primary Cardiologist: [name] Reason for Consultation: CHF  Principal Problem:   Acute on chronic HFrEF (heart failure with reduced ejection fraction) ([place]) Active Problems:   Essential hypertension   Cardiomyopathy, ischemic   Paroxysmal atrial fibrillation (HCC)   Transaminitis   LV (left ventricular) mural thrombus   Thrombocytopenia (HCC)   HPI:  71 y.o. seen in ED for CHF.  Patient followed by Dr [name]. Poor historian and not very coherent currently ? Active ETOH. Sent to ED for abnormal TTE done 05/17/22 Reviewed and shows severe LV dysfunction with low output and dilated IvC Global hypokinesis and large mural apical thrombus. History of multiple CVAls Non compliant with GDMT. ILR with PAF in past and was supposed to be on eliquis but also non compliant with this for most part. Did have a heart cath in 2017 with diffuse non obstructive CAD and no history of MI. Presumed non ischemic DCM with prior EF by TTE 09/27/19 40-45% Has had elevated LFTls and "painless" jaundice for over a year. Abdominal US 06/06/21 negative   Labs on admission BNP 3385, K 4.0, BUN 29 Cr 1.39 AST 132, ALT 141 TB 3.2 and Troponin negative   ROS All other systems reviewed and negative except as noted above  Past Medical History:  Diagnosis Date   Alcohol abuse    Candida esophagitis (HCC)    Cardiomyopathy (HCC)    CHF (congestive heart failure) (HCC)    Coronary artery disease    CVA (cerebral infarction)    Duodenal ulcer    Dysphagia    Headache(784.0)    history of   Hypertension    Prostate cancer ([place]) 12/2018   Severe protein-calorie malnutrition ([place])    Stroke ([place]) 09/25/2019   Vitamin D deficiency 09/2019    Family History  Problem Relation Age of Onset   Heart attack Maternal  Grandfather    Colon cancer Neg Hx    Breast cancer Neg Hx    Prostate cancer Neg Hx     Social History   Socioeconomic History   Marital status: Legally Separated    Spouse name: Not on file   Number of children: 1   Years of education: Not on file   Highest education level: Not on file  Occupational History    Comment: retired  Tobacco Use   Smoking status: Every Day    Packs/day: 0.50    Years: 30.00    Total pack years: 15.00    Types: Cigarettes   Smokeless tobacco: Never   Tobacco comments:    Pt smokes 6 to 7 a day trying to quit  Vaping Use   Vaping Use: Never used  Substance and Sexual Activity   Alcohol use: No    Alcohol/week: 3.0 standard drinks of alcohol    Types: 3 Shots of liquor per week    Comment: quit in 10/2015    Drug use: Yes    Types: Marijuana    Comment: occ   Sexual activity: Not Currently  Other Topics Concern   Not on file  Social History Narrative   Not on file   Social Determinants of Health   Financial Resource Strain: Not on file  Food Insecurity: Not on file  Transportation Needs: Not on file  Physical Activity: Not on file  Stress:  Not on file  Social Connections: Not on file  Intimate Partner Violence: Not on file    Past Surgical History:  Procedure Laterality Date   BALLOON DILATION N/A 11/22/2015   Procedure: BALLOON DILATION;  Surgeon: Mauri Pole, MD;  Location: [facility];  Service: Endoscopy;  Laterality: N/A;   CARDIAC CATHETERIZATION N/A 01/17/2016   Procedure: Left Heart Cath and Coronary Angiography;  Surgeon: Sherren Mocha, MD;  Location: [facility];  Service: Cardiovascular;  Laterality: N/A;   DIRECT LARYNGOSCOPY  05/07/2011   Procedure: DIRECT LARYNGOSCOPY;  Surgeon: Rozetta Nunnery, MD;  Location: [facility];  Service: ENT;  Laterality: N/A;  ESOPHAGOSCOPY  WITH DILATION   EP IMPLANTABLE DEVICE N/A 11/17/2015   Procedure: Loop Recorder Insertion;  Surgeon: Evans Lance, MD;  Location: [facility];  Service: Cardiovascular;  Laterality: N/A;   ESOPHAGOGASTRODUODENOSCOPY  04/29/2011   Procedure: ESOPHAGOGASTRODUODENOSCOPY (EGD);  Surgeon: Winfield Cunas., MD;  Location: [facility];  Service: Endoscopy;  Laterality: N/A;   ESOPHAGOGASTRODUODENOSCOPY N/A 11/22/2015   Procedure: ESOPHAGOGASTRODUODENOSCOPY (EGD);  Surgeon: Mauri Pole, MD;  Location: [facility];  Service: Endoscopy;  Laterality: N/A;   IR CT HEAD LTD  09/26/2019   IR PERCUTANEOUS ART THROMBECTOMY/INFUSION INTRACRANIAL INC DIAG ANGIO  09/26/2019   IR US GUIDE VASC ACCESS RIGHT  09/26/2019   LOOP RECORDER IMPLANT     RADIOLOGY WITH ANESTHESIA N/A 09/26/2019   Procedure: IR WITH ANESTHESIA;  Surgeon: Luanne Bras, MD;  Location: [facility];  Service: Radiology;  Laterality: N/A;      Current Facility-Administered Medications:    0.9 %  sodium chloride infusion, 250 mL, Intravenous, PRN, Alcario Drought, Jared M, DO   acetaminophen (TYLENOL) tablet 650 mg, 650 mg, Oral, Q4H PRN, Alcario Drought, Jared M, DO   atorvastatin (LIPITOR) tablet 40 mg, 40 mg, Oral, Daily, Alcario Drought, Jared M, DO, 40 mg at 05/18/22 1007   furosemide (LASIX) injection 40 mg, 40 mg, Intravenous, Daily, Alcario Drought, Jared M, DO, 40 mg at 05/18/22 1007   heparin ADULT infusion 100 units/mL (25000 units/269m), 1,200 Units/hr, Intravenous, Continuous, BLaren Everts RPH, Last Rate: 12 mL/hr at 05/18/22 0239, 1,200 Units/hr at 05/18/22 0239   metoprolol succinate (TOPROL-XL) 24 hr tablet 25 mg, 25 mg, Oral, Daily, GAlcario Drought Jared M, DO, 25 mg at 05/18/22 1007   ondansetron (ZOFRAN) injection 4 mg, 4 mg, Intravenous, Q6H PRN, GAlcario Drought Jared M, DO   sodium chloride flush (NS) 0.9 % injection 3 mL, 3 mL, Intravenous, Q12H, GAlcario Drought Jared M, DO, 3 mL at 05/18/22 0159   sodium chloride flush (NS) 0.9 % injection 3 mL, 3 mL, Intravenous, PRN, GEtta Quill DO  Current Outpatient Medications:    atorvastatin (LIPITOR) 40 MG tablet, Take 1 tablet (40 mg total)  by mouth daily., Disp: 90 tablet, Rfl: 3   ELIQUIS 5 MG TABS tablet, TAKE 1 TABLET BY MOUTH TWICE A DAY, Disp: 60 tablet, Rfl: 6   furosemide (LASIX) 20 MG tablet, Take 1 tablet (20 mg total) by mouth daily., Disp: 90 tablet, Rfl: 3   losartan (COZAAR) 25 MG tablet, Take 1 tablet (25 mg total) by mouth daily., Disp: 90 tablet, Rfl: 3   metoprolol succinate (TOPROL XL) 25 MG 24 hr tablet, Take 1 tablet (25 mg total) by mouth daily., Disp: 90 tablet, Rfl: 3   Blood Pressure Monitoring (BLOOD PRESSURE CUFF) MISC, Check blood pressure daily between 8 pm -9 pm, Disp: 1 each, Rfl: 0  atorvastatin  40  mg Oral Daily   furosemide  40 mg Intravenous Daily   metoprolol succinate  25 mg Oral Daily   sodium chloride flush  3 mL Intravenous Q12H    sodium chloride     heparin 1,200 Units/hr (05/18/22 0239)    Physical Exam: Blood pressure (!) 113/90, pulse (!) 120, temperature (!) 97.2 F (36.2 C), temperature source Oral, resp. rate (!) 22, height '5\' 11"'$  (1.803 m), weight 68 kg, SpO2 100 %.   Disheveled black male Tachypnic JVP elevated PMI enlarged  Apical MR murmur  Positive HJR no ascites  Plus one edema Prior bed bugs with dry skin and excoriations  ILR under left pre pectoral area   Labs:   Lab Results  Component Value Date   WBC 6.2 05/18/2022   HGB 14.8 05/18/2022   HCT 43.5 05/18/2022   MCV 94.2 05/18/2022   PLT 88 (L) 05/18/2022    Recent Labs  Lab 05/17/22 1408 05/18/22 0233  NA 140  --   K 4.2  --   CL 102  --   CO2 21*  --   BUN 25*  --   CREATININE 1.39*  --   CALCIUM 9.1  --   PROT 6.9 <3.0*  BILITOT 3.2* 0.5  ALKPHOS 253* 55  ALT 141* 35  AST 132* 31  GLUCOSE 99  --    No results found for: "CKTOTAL", "CKMB", "CKMBINDEX", "TROPONINI"  Lab Results  Component Value Date   CHOL 160 05/14/2021   CHOL 139 10/08/2019   CHOL 192 09/26/2019   Lab Results  Component Value Date   HDL 45 05/14/2021   HDL 57 10/08/2019   HDL 54 09/26/2019   Lab Results   Component Value Date   LDLCALC 102 (H) 05/14/2021   LDLCALC 71 10/08/2019   LDLCALC 127 (H) 09/26/2019   Lab Results  Component Value Date   TRIG 63 05/14/2021   TRIG 49 10/08/2019   TRIG 53 09/26/2019   TRIG 52 09/26/2019   Lab Results  Component Value Date   CHOLHDL 3.6 05/14/2021   CHOLHDL 2.4 10/08/2019   CHOLHDL 3.6 09/26/2019   No results found for: "LDLDIRECT"    Radiology: CT Head Wo Contrast  Result Date: 05/18/2022 CLINICAL DATA:  Fall from bed, on blood thinners. EXAM: CT HEAD WITHOUT CONTRAST TECHNIQUE: Contiguous axial images were obtained from the base of the skull through the vertex without intravenous contrast. RADIATION DOSE REDUCTION: This exam was performed according to the departmental dose-optimization program which includes automated exposure control, adjustment of the mA and/or kV according to patient size and/or use of iterative reconstruction technique. COMPARISON:  09/26/2019 brain MRI FINDINGS: Brain: No evidence of acute infarction, hemorrhage, hydrocephalus, extra-axial collection or mass lesion/mass effect. Chronic bilateral occipital (left more than right), posterior right frontal, and bilateral cerebellar infarcts. Vascular: No hyperdense vessel or unexpected calcification. Skull: Normal. Negative for fracture or focal lesion. Sinuses/Orbits: No visible injury IMPRESSION: 1. No evidence of intracranial injury. 2. Multiple chronic infarcts including interval small right occipital cortex infarct since a 2021 comparison. Electronically Signed   By: Jorje Guild M.D.   On: 05/18/2022 04:24   DG Chest 2 View  Result Date: 05/17/2022 CLINICAL DATA:  SOB EXAM: CHEST - 2 VIEW COMPARISON:  04/17/22 FINDINGS: Cardiomegaly. No pleural effusion. No pneumothorax. No focal airspace opacity. Loop recorder device in place. Prominent interstitial opacities bilaterally, which could represent pulmonary venous congestion or atypical infection. No displaced rib fractures.  Visualized upper abdomen is  unremarkable. IMPRESSION: 1. Prominent interstitial opacities bilaterally, which could represent pulmonary venous congestion or atypical infection. 2. Cardiomegaly. Electronically Signed   By: Marin Roberts M.D.   On: 05/17/2022 14:35   ECHOCARDIOGRAM COMPLETE  Result Date: 05/17/2022    ECHOCARDIOGRAM REPORT   Patient Name:   Gregory [place] Date of Exam: 05/17/2022 Medical Rec #:  [id number]         Height:       71.0 in Accession #:    [id number]        Weight:       103.0 lb Date of Birth:  07-01-1950          BSA:          1.590 m Patient Age:    89 years          BP:           138/80 mmHg Patient Gender: M                 HR:           97 bpm. Exam Location:  [address] Procedure: 2D Echo, Cardiac Doppler, Color Doppler and Intracardiac            Opacification Agent                      STAT ECHO Reported to: Dr. Curt Bears on 05/17/2022 12:30:00 PM. Indications:    Congestive Heart Failure I50.9  History:        Patient has prior history of Echocardiogram examinations, most                 recent 09/27/2019. CAD; Risk Factors:Hypertension.  Sonographer:    Mikki Santee RDCS Referring Phys: [id number] Renn Dirocco M [name] IMPRESSIONS  1. Severe global reduction in LV function with large apical thrombus; Compared to 09/27/19 LV function worse and apical thrombus new; pt sent to ER by Dr Curt Bears.  2. Left ventricular ejection fraction, by estimation, is <20%. The left ventricle has severely decreased function. The left ventricle demonstrates global hypokinesis. The left ventricular internal cavity size was severely dilated. Left ventricular diastolic parameters are consistent with Grade III diastolic dysfunction (restrictive).  3. Right ventricular systolic function is severely reduced. The right ventricular size is moderately enlarged.  4. Left atrial size was severely dilated.  5. Right atrial size was severely dilated.  6. A small pericardial effusion is present.  7. The mitral valve is  normal in structure. Mild mitral valve regurgitation. No evidence of mitral stenosis.  8. The aortic valve is tricuspid. Aortic valve regurgitation is mild. Aortic valve sclerosis is present, with no evidence of aortic valve stenosis.  9. The inferior vena cava is dilated in size with <50% respiratory variability, suggesting right atrial pressure of 15 mmHg. FINDINGS  Left Ventricle: Left ventricular ejection fraction, by estimation, is <20%. The left ventricle has severely decreased function. The left ventricle demonstrates global hypokinesis. Definity contrast agent was given IV to delineate the left ventricular endocardial borders. The left ventricular internal cavity size was severely dilated. There is no left ventricular hypertrophy. Left ventricular diastolic parameters are consistent with Grade III diastolic dysfunction (restrictive). Right Ventricle: The right ventricular size is moderately enlarged. Right ventricular systolic function is severely reduced. Left Atrium: Left atrial size was severely dilated. Right Atrium: Right atrial size was severely dilated. Pericardium: A small pericardial effusion is present. Mitral Valve: The mitral valve is normal in structure. Mild mitral  valve regurgitation. No evidence of mitral valve stenosis. Tricuspid Valve: The tricuspid valve is normal in structure. Tricuspid valve regurgitation is mild . No evidence of tricuspid stenosis. Aortic Valve: The aortic valve is tricuspid. Aortic valve regurgitation is mild. Aortic valve sclerosis is present, with no evidence of aortic valve stenosis. Pulmonic Valve: The pulmonic valve was normal in structure. Pulmonic valve regurgitation is trivial. No evidence of pulmonic stenosis. Aorta: The aortic root is normal in size and structure. Venous: The inferior vena cava is dilated in size with less than 50% respiratory variability, suggesting right atrial pressure of 15 mmHg. IAS/Shunts: No atrial level shunt detected by color flow  Doppler. Additional Comments: Severe global reduction in LV function with large apical thrombus; Compared to 09/27/19 LV function worse and apical thrombus new; pt sent to ER by Dr Curt Bears.  LEFT VENTRICLE PLAX 2D LVIDd:         6.60 cm LVIDs:         6.40 cm LV PW:         1.10 cm LV IVS:        1.00 cm LVOT diam:     2.20 cm LV SV:         27 LV SV Index:   17 LVOT Area:     3.80 cm  RIGHT VENTRICLE RV Basal diam:  5.10 cm RV Mid diam:    3.50 cm RV S prime:     3.98 cm/s TAPSE (M-mode): 1.0 cm LEFT ATRIUM              Index        RIGHT ATRIUM           Index LA diam:        4.80 cm  3.02 cm/m   RA Area:     26.10 cm LA Vol (A2C):   102.0 ml 64.14 ml/m  RA Volume:   92.10 ml  57.91 ml/m LA Vol (A4C):   83.0 ml  52.19 ml/m LA Biplane Vol: 91.6 ml  57.60 ml/m  AORTIC VALVE LVOT Vmax:   49.40 cm/s LVOT Vmean:  28.450 cm/s LVOT VTI:    0.072 m  AORTA Ao Root diam: 3.40 cm TRICUSPID VALVE TR Peak grad:   32.9 mmHg TR Vmax:        287.00 cm/s  SHUNTS Systemic VTI:  0.07 m Systemic Diam: 2.20 cm Kirk Ruths MD Electronically signed by Kirk Ruths MD Signature Date/Time: 05/17/2022/12:54:05 PM    Final     EKG: ST rate 112 no acute ST changes    ASSESSMENT AND PLAN:   CHF:  acute on chronic systolic Likely non ischemic Prior cath 2017 non obstructive Dx. Non compliant with meds ? Ongoing ETOH. TTE with EF now < 20% low output congestive. Bad prognostic signs with tachycardia and elevated LFTls. Continue lasix iv daily Has bumped Cr from yesterday from 1/19 to 1.39 with just one dose lasix. Consider adding ARB in am if Cr stable with diuretic. Hold beta blocker with low output Have asked Dr Jeffie Pollock to see Not sure he would be a candidate for low dose milrinone with tachycardia.  LV Thrombus:  currently on heparin Given compliance issues not a good coumadin candidate Not compliant with eliquis consider transition to once daily xarelto for convenience  in 48 hours  Elevated Liver Enzymes: likely  contribution from congestive hepatopathy but TB up and more chronic  ETOH history Hepatitis panel pending [place] 06/06/21 negative would repeat as  starter  CVAls prior with PAF and ILR in NSR now non compliant with DOAC  Thrombocytopenia:  increased risk of bleeding likely splenic sequestration follow while on heparin   Signed: Jenkins Rouge 05/18/2022, 12:26 PM

## 2022-05-18 NOTE — ED Notes (Signed)
Blue top hemolyzed.  IV did not pull well.  Lab notified to come draw.

## 2022-05-18 NOTE — Progress Notes (Addendum)
ANTICOAGULATION CONSULT NOTE - Initial Consult  Pharmacy Consult for heparin Indication:  apical thrombus and atrial fibrillation  No Known Allergies  Patient Measurements: Height: '5\' 11"'$  (180.3 cm) Weight: 68 kg (150 lb) (estimate) IBW/kg (Calculated) : 75.3  Vital Signs: Temp: 97.6 F (36.4 C) (12/22 2234) Temp Source: Oral (12/22 2234) BP: 129/105 (12/23 0145) Pulse Rate: 53 (12/22 2234)  Labs: Recent Labs    05/17/22 1408  HGB 15.2  HCT 46.8  PLT 83*  CREATININE 1.39*    Estimated Creatinine Clearance: 48.2 mL/min (A) (by C-G formula based on SCr of 1.39 mg/dL (H)).   Medical History: Past Medical History:  Diagnosis Date   Alcohol abuse    Candida esophagitis (HCC)    Cardiomyopathy (HCC)    CHF (congestive heart failure) (HCC)    Coronary artery disease    CVA (cerebral infarction)    Duodenal ulcer    Dysphagia    Headache(784.0)    history of   Hypertension    Prostate cancer ([place]) 12/2018   Severe protein-calorie malnutrition ([place])    Stroke ([place]) 09/25/2019   Vitamin D deficiency 09/2019    Assessment: 71yo male was sent to ED from outpatient echo where a large apical clot was discovered despite Eliquis for Afib (pt admits to missing doses), last dose taken 12/22 pm >> to start heparin for now and eventually transition to another [place].  Goal of Therapy:  Heparin level 0.3-0.7 units/ml aPTT 66-102 seconds Monitor platelets by anticoagulation protocol: Yes   Plan:  Heparin 2000 units IV bolus followed by infusion at 1200 units/hr, start immediately given DOAC failure. Monitor heparin levels, aPTT (while Eliquis affects anti-Xa assay), and CBC.  Wynona Neat, PharmD, BCPS  05/18/2022,1:55 AM

## 2022-05-18 NOTE — Evaluation (Signed)
Physical Therapy Evaluation Patient Details Name: Gregory Perry MRN: [id number] DOB: [birth year] Today's Date: 05/18/2022  History of Present Illness  Pt is a 71 y.o. male who presented 05/17/22 as referral from cardiology due to SOB and echocardiogram showing LV mural thrombus and worsening HF. PMH: HTN, CVA, CAD, PAF on eliquis, alcohol abuse, CHF, prostate cancer   Clinical Impression  MD cleared pt for PT Eval. Pt presents with condition above and deficits mentioned below, see PT Problem List. PTA, he was mod I, intermittently holding onto furniture, for functional mobility. Pt reports the distance he could ambulate varied from as little as 20 ft at a time to >100 ft at a time depending on his SOB. Pt lives with his x3 siblings in a 1-level house with 2 STE. Currently, pt is demonstrating deficits in activity tolerance, balance, strength, and power. He is requiring up to minA for transfers and min-modA to ambulate, even with a RW for support. Pt would spontaneously begin to increase his work of breathing and appear and report to get lightheaded, increasing his posterior lean and resulting in pt only tolerating ambulating up to ~13 ft at a time this date. His SpO2 would not read (despite multiple location attempts) but when it finally did it read >/= 93% on RA at rest end of session. RN notified and confirmed she would manage fixing it to read better/more consistently. Pt reports his siblings may prefer to be independent and he is concerned about being a burden for them. Recommending palliative consult and SNF for rehab after d/c depending on the level of support he can obtain upon d/c. If his family can provide the level of care needed and he progresses well then may be able to update recs to [facility]. Discussed long term placement options as well. Will continue to follow acutely.     Recommendations for follow up therapy are one component of a multi-disciplinary discharge planning process, led by the  attending physician.  Recommendations may be updated based on patient status, additional functional criteria and insurance authorization.  Follow Up Recommendations Skilled nursing-short term rehab (<3 hours/day) (vs HHPT pending family support) Can patient physically be transported by private vehicle: Yes    Assistance Recommended at Discharge Intermittent Supervision/Assistance  Patient can return home with the following  A lot of help with walking and/or transfers;A little help with bathing/dressing/bathroom;Assistance with cooking/housework;Assist for transportation;Help with stairs or ramp for entrance    Equipment Recommendations Rolling walker (2 wheels);Wheelchair (measurements PT);Wheelchair cushion (measurements PT)  Recommendations for Other Services  OT consult;Other (comment) (Palliative consult)    Functional Status Assessment Patient has had a recent decline in their functional status and demonstrates the ability to make significant improvements in function in a reasonable and predictable amount of time.     Precautions / Restrictions Precautions Precautions: Fall;Other (comment) Precaution Comments: watch BP and SpO2 Restrictions Weight Bearing Restrictions: No      Mobility  Bed Mobility Overal bed mobility: Needs Assistance Bed Mobility: Supine to Sit, Sit to Supine     Supine to sit: Min guard, HOB elevated Sit to supine: Min guard, HOB elevated   General bed mobility comments: Min guard for safety, extra time to complete, HOB elevated    Transfers Overall transfer level: Needs assistance Equipment used: Rolling walker (2 wheels), None Transfers: Sit to/from Stand, Bed to chair/wheelchair/BSC Sit to Stand: Min assist   Step pivot transfers: Min assist       General transfer comment: Posterior bias  noted but pt quickly corrects, minA to prevent LOB coming to stand.    Ambulation/Gait Ambulation/Gait assistance: Min assist, Mod assist Gait  Distance (Feet): 13 Feet (x3 bouts of ~13 ft > ~1 ft > ~13 ft) Assistive device: Rolling walker (2 wheels), None Gait Pattern/deviations: Step-through pattern, Decreased stride length, Wide base of support, Trunk flexed Gait velocity: reduced Gait velocity interpretation: <1.31 ft/sec, indicative of household ambulator   General Gait Details: Pt with slow, unsteady gait, intially ambulating without UE support but pt with trunk sway in all directions needing min-modA to prevent LOB. Transitioned to RW but pt began to lean more posteriorly with increased DOE and reported lightheadedness with prolonged standing, thus returned to sit in chair with modA to maintain safety. Min-modA for stability using RW depending on lightheadedness, posterior lean, and trunk sway.  Stairs            Wheelchair Mobility    Modified Rankin (Stroke Patients Only)       Balance Overall balance assessment: Needs assistance Sitting-balance support: No upper extremity supported, Feet supported Sitting balance-Leahy Scale: Fair     Standing balance support: No upper extremity supported, Bilateral upper extremity supported, During functional activity Standing balance-Leahy Scale: Fair Standing balance comment: Able to stand statically with min guard-minA without UE support, benefits from RW and physical assistance to prevent LOB                             Pertinent Vitals/Pain Pain Assessment Pain Assessment: Faces Faces Pain Scale: No hurt Pain Intervention(s): Monitored during session    Home Living Family/patient expects to be discharged to:: Private residence Living Arrangements: Other relatives (x2 brothers and x1 sister) Available Help at Discharge: Family;Available 24 hours/day Type of Home: House Home Access: Stairs to enter Entrance Stairs-Rails: None Entrance Stairs-Number of Steps: 2   Home Layout: One level Home Equipment: BSC/3in1;Grab bars - tub/shower      Prior  Function Prior Level of Function : Needs assist             Mobility Comments: Mod I with no AD, intermittently holds onto furniture for support. Some days can only walk ~20 ft at a time and other days can walk > 100 ft at a time. ADLs Comments: Siblings assisted with shopping and driving, otherwise pt was mod I for ADLs; impaired sight per pt     Hand Dominance        Extremity/Trunk Assessment   Upper Extremity Assessment Upper Extremity Assessment: Defer to OT evaluation    Lower Extremity Assessment Lower Extremity Assessment: Generalized weakness    Cervical / Trunk Assessment Cervical / Trunk Assessment: Kyphotic  Communication   Communication: No difficulties  Cognition Arousal/Alertness: Awake/alert Behavior During Therapy: WFL for tasks assessed/performed Overall Cognitive Status: Within Functional Limits for tasks assessed                                 General Comments: Slightly slowed processing and questionable memory but likely close to baseline        General Comments General comments (skin integrity, edema, etc.): BP 135/102 sitting after getting lightheaded with ambulating, unable to get good pleth or read of SpO2 despite trying different locations including bil ears and fingers until end of session when it would intermittently read >/= 93% on RA but with poor pleth, notified RN of his  need to have this managed to give a more consistent and reliable read for his safety, RN verbalized she would manage it    Exercises     Assessment/Plan    PT Assessment Patient needs continued PT services  PT Problem List Decreased strength;Decreased activity tolerance;Decreased mobility;Decreased balance;Cardiopulmonary status limiting activity       PT Treatment Interventions DME instruction;Gait training;Stair training;Functional mobility training;Therapeutic activities;Therapeutic exercise;Balance training;Neuromuscular re-education;Patient/family  education    PT Goals (Current goals can be found in the Care Plan section)  Acute Rehab PT Goals Patient Stated Goal: to get to be a part of his grandchildren's lives PT Goal Formulation: With patient Time For Goal Achievement: 06/01/22 Potential to Achieve Goals: Fair    Frequency Min 3X/week     Co-evaluation               AM-PAC PT "6 Clicks" Mobility  Outcome Measure Help needed turning from your back to your side while in a flat bed without using bedrails?: A Little Help needed moving from lying on your back to sitting on the side of a flat bed without using bedrails?: A Little Help needed moving to and from a bed to a chair (including a wheelchair)?: A Little Help needed standing up from a chair using your arms (e.g., wheelchair or bedside chair)?: A Little Help needed to walk in hospital room?: Total Help needed climbing 3-5 steps with a railing? : Total 6 Click Score: 14    End of Session Equipment Utilized During Treatment: Gait belt Activity Tolerance: Other (comment) (limited by lightheadedness and SOB) Patient left: in bed;with call bell/phone within reach;with bed alarm set Nurse Communication: Mobility status;Other (comment) (SpO2 not reading well) PT Visit Diagnosis: Unsteadiness on feet (R26.81);Other abnormalities of gait and mobility (R26.89);Muscle weakness (generalized) (M62.81);Difficulty in walking, not elsewhere classified (R26.2)    Time: 8676-1950 PT Time Calculation (min) (ACUTE ONLY): 41 min   Charges:   PT Evaluation $PT Eval Moderate Complexity: 1 Mod PT Treatments $Gait Training: 8-22 mins $Therapeutic Activity: 8-22 mins        Moishe Spice, PT, DPT Acute Rehabilitation Services  Office: [phone]   Orvan Falconer 05/18/2022, 6:02 PM

## 2022-05-18 NOTE — ED Provider Notes (Addendum)
[facility] EMERGENCY DEPARTMENT Provider Note   CSN: [id number] Arrival date & time: 05/17/22  1258     History  Chief Complaint  Patient presents with   Shortness of Breath    Gregory Perry is a 71 y.o. male.  The history is provided by the patient.  Shortness of Breath He has history of hypertension, stroke, coronary artery disease, heart failure, duodenal ulcer, prostate cancer and was told to come to the emergency department because of an apical thrombus noted on echocardiogram.  He has been having shortness of breath for several months with symptoms waxing and waning but no recent worsening.  He has chronic edema which is also worsening.  He does have some intermittent chest discomfort.  He is anticoagulated on apixaban.   Home Medications Prior to Admission medications   Medication Sig Start Date End Date Taking? Authorizing Provider  atorvastatin (LIPITOR) 40 MG tablet Take 1 tablet (40 mg total) by mouth daily. 04/25/22   [name], Peter M, MD  Blood Pressure Monitoring (BLOOD PRESSURE CUFF) MISC Check blood pressure daily between 8 pm -9 pm 09/13/16   [name], FNP  ELIQUIS 5 MG TABS tablet TAKE 1 TABLET BY MOUTH TWICE A DAY 12/20/21   Fenton Foy, NP  furosemide (LASIX) 20 MG tablet Take 1 tablet (20 mg total) by mouth daily. 04/25/22   [name], Peter M, MD  losartan (COZAAR) 25 MG tablet Take 1 tablet (25 mg total) by mouth daily. 04/25/22 04/20/23  [name], Peter M, MD  metoprolol succinate (TOPROL XL) 25 MG 24 hr tablet Take 1 tablet (25 mg total) by mouth daily. 04/25/22   [name], Peter M, MD      Allergies    Patient has no known allergies.    Review of Systems   Review of Systems  Respiratory:  Positive for shortness of breath.   All other systems reviewed and are negative.   Physical Exam Updated Vital Signs BP (!) 119/108 (BP Location: Left Arm)   Pulse (!) 53   Temp 97.6 F (36.4 C) (Oral)   Resp 16   SpO2 94%   Physical Exam Vitals and nursing note reviewed.   71 year old male, resting comfortably and in no acute distress. Vital signs are significant for borderline elevated diastolic blood pressure. Oxygen saturation is 94%, which is normal. Head is normocephalic and atraumatic. PERRLA, EOMI. Oropharynx is clear. Neck is nontender and supple without adenopathy. JVD is present at 90 degrees. Back is nontender and there is no CVA tenderness.  There is 2+ presacral edema. Lungs are clear without rales, wheezes, or rhonchi. Chest is nontender. Heart has an irregular rhythm without murmur. Abdomen is soft, flat, nontender. Extremities have 2+ edema, full range of motion is present. Skin is warm and dry without rash. Neurologic: Mental status is normal, cranial nerves are intact, moves all extremities equally.  ED Results / Procedures / Treatments   Labs (all labs ordered are listed, but only abnormal results are displayed) Labs Reviewed  CBC WITH DIFFERENTIAL/PLATELET - Abnormal; Notable for the following components:      Result Value   Platelets 83 (*)    nRBC 0.8 (*)    All other components within normal limits  BRAIN NATRIURETIC PEPTIDE - Abnormal; Notable for the following components:   B Natriuretic Peptide 3,932.0 (*)    All other components within normal limits  COMPREHENSIVE METABOLIC PANEL - Abnormal; Notable for the following components:   CO2 21 (*)  BUN 25 (*)    Creatinine, Ser 1.39 (*)    Albumin 3.1 (*)    AST 132 (*)    ALT 141 (*)    Alkaline Phosphatase 253 (*)    Total Bilirubin 3.2 (*)    GFR, Estimated 55 (*)    Anion gap 17 (*)    All other components within normal limits    EKG EKG Interpretation  Date/Time:  Friday May 17 2022 13:41:17 EST Ventricular Rate:  112 PR Interval:  162 QRS Duration: 98 QT Interval:  332 QTC Calculation: 453 R Axis:   171 Text Interpretation: Sinus tachycardia Left posterior fascicular block Inferior infarct , age  undetermined Anterior infarct , age undetermined Abnormal ECG When compared with ECG of 17-Apr-2022 13:37, HEART RATE has increased Confirmed by Delora Fuel ([id number]) on 05/18/2022 12:37:35 AM  Radiology DG Chest 2 View  Result Date: 05/17/2022 CLINICAL DATA:  SOB EXAM: CHEST - 2 VIEW COMPARISON:  04/17/22 FINDINGS: Cardiomegaly. No pleural effusion. No pneumothorax. No focal airspace opacity. Loop recorder device in place. Prominent interstitial opacities bilaterally, which could represent pulmonary venous congestion or atypical infection. No displaced rib fractures. Visualized upper abdomen is unremarkable. IMPRESSION: 1. Prominent interstitial opacities bilaterally, which could represent pulmonary venous congestion or atypical infection. 2. Cardiomegaly. Electronically Signed   By: Marin Roberts M.D.   On: 05/17/2022 14:35   ECHOCARDIOGRAM COMPLETE  Result Date: 05/17/2022    ECHOCARDIOGRAM REPORT   Patient Name:   Gregory Perry Date of Exam: 05/17/2022 Medical Rec #:  [id number]         Height:       71.0 in Accession #:    [id number]        Weight:       103.0 lb Date of Birth:  05-15-1951          BSA:          1.590 m Patient Age:    9 years          BP:           138/80 mmHg Patient Gender: M                 HR:           97 bpm. Exam Location:  [address] Procedure: 2D Echo, Cardiac Doppler, Color Doppler and Intracardiac            Opacification Agent                      STAT ECHO Reported to: Dr. Curt Bears on 05/17/2022 12:30:00 PM. Indications:    Congestive Heart Failure I50.9  History:        Patient has prior history of Echocardiogram examinations, most                 recent 09/27/2019. CAD; Risk Factors:Hypertension.  Sonographer:    Mikki Santee RDCS Referring Phys: [id number] PETER M [name] IMPRESSIONS  1. Severe global reduction in LV function with large apical thrombus; Compared to 09/27/19 LV function worse and apical thrombus new; pt sent to ER by Dr Curt Bears.  2. Left ventricular  ejection fraction, by estimation, is <20%. The left ventricle has severely decreased function. The left ventricle demonstrates global hypokinesis. The left ventricular internal cavity size was severely dilated. Left ventricular diastolic parameters are consistent with Grade III diastolic dysfunction (restrictive).  3. Right ventricular systolic function is severely reduced. The right ventricular size is moderately  enlarged.  4. Left atrial size was severely dilated.  5. Right atrial size was severely dilated.  6. A small pericardial effusion is present.  7. The mitral valve is normal in structure. Mild mitral valve regurgitation. No evidence of mitral stenosis.  8. The aortic valve is tricuspid. Aortic valve regurgitation is mild. Aortic valve sclerosis is present, with no evidence of aortic valve stenosis.  9. The inferior vena cava is dilated in size with <50% respiratory variability, suggesting right atrial pressure of 15 mmHg. FINDINGS  Left Ventricle: Left ventricular ejection fraction, by estimation, is <20%. The left ventricle has severely decreased function. The left ventricle demonstrates global hypokinesis. Definity contrast agent was given IV to delineate the left ventricular endocardial borders. The left ventricular internal cavity size was severely dilated. There is no left ventricular hypertrophy. Left ventricular diastolic parameters are consistent with Grade III diastolic dysfunction (restrictive). Right Ventricle: The right ventricular size is moderately enlarged. Right ventricular systolic function is severely reduced. Left Atrium: Left atrial size was severely dilated. Right Atrium: Right atrial size was severely dilated. Pericardium: A small pericardial effusion is present. Mitral Valve: The mitral valve is normal in structure. Mild mitral valve regurgitation. No evidence of mitral valve stenosis. Tricuspid Valve: The tricuspid valve is normal in structure. Tricuspid valve regurgitation is mild .  No evidence of tricuspid stenosis. Aortic Valve: The aortic valve is tricuspid. Aortic valve regurgitation is mild. Aortic valve sclerosis is present, with no evidence of aortic valve stenosis. Pulmonic Valve: The pulmonic valve was normal in structure. Pulmonic valve regurgitation is trivial. No evidence of pulmonic stenosis. Aorta: The aortic root is normal in size and structure. Venous: The inferior vena cava is dilated in size with less than 50% respiratory variability, suggesting right atrial pressure of 15 mmHg. IAS/Shunts: No atrial level shunt detected by color flow Doppler. Additional Comments: Severe global reduction in LV function with large apical thrombus; Compared to 09/27/19 LV function worse and apical thrombus new; pt sent to ER by Dr Curt Bears.  LEFT VENTRICLE PLAX 2D LVIDd:         6.60 cm LVIDs:         6.40 cm LV PW:         1.10 cm LV IVS:        1.00 cm LVOT diam:     2.20 cm LV SV:         27 LV SV Index:   17 LVOT Area:     3.80 cm  RIGHT VENTRICLE RV Basal diam:  5.10 cm RV Mid diam:    3.50 cm RV S prime:     3.98 cm/s TAPSE (M-mode): 1.0 cm LEFT ATRIUM              Index        RIGHT ATRIUM           Index LA diam:        4.80 cm  3.02 cm/m   RA Area:     26.10 cm LA Vol (A2C):   102.0 ml 64.14 ml/m  RA Volume:   92.10 ml  57.91 ml/m LA Vol (A4C):   83.0 ml  52.19 ml/m LA Biplane Vol: 91.6 ml  57.60 ml/m  AORTIC VALVE LVOT Vmax:   49.40 cm/s LVOT Vmean:  28.450 cm/s LVOT VTI:    0.072 m  AORTA Ao Root diam: 3.40 cm TRICUSPID VALVE TR Peak grad:   32.9 mmHg TR Vmax:  287.00 cm/s  SHUNTS Systemic VTI:  0.07 m Systemic Diam: 2.20 cm Kirk Ruths MD Electronically signed by Kirk Ruths MD Signature Date/Time: 05/17/2022/12:54:05 PM    Final     Procedures Procedures  Cardiac monitor shows sinus rhythm with frequent PACs, per my interpretation.  Medications Ordered in ED Medications - No data to display  ED Course/ Medical Decision Making/ A&P                            Medical Decision Making Amount and/or Complexity of Data Reviewed Labs: ordered. Radiology: ordered.  Risk Prescription drug management. Decision regarding hospitalization.   Established heart failure with development of apical mural thrombus while anticoagulated on apixaban.  He will need to be switched to warfarin.  I have ordered heparin intravenously to get him started on anticoagulation.  I have reviewed his records, and echocardiogram on 05/17/2022 showed ejection fraction less than 20% with a large apical thrombus, grade 3 diastolic dysfunction.  He also had an ED visit on 04/17/2022 for acute on chronic systolic heart failure.  I have reviewed and interpreted his electrocardiogram, and my interpretation is sinus tachycardia with left posterior fascicular block not significantly changed from prior.  Chest x-ray showed cardiomegaly with pulmonary vascular congestion.  I have independently viewed the images, and agree with radiologist's interpretation.  I have reviewed and interpreted his laboratory tests, and my interpretation is renal insufficiency slightly worse compared with 04/17/2022, elevated alkaline phosphatase new compared with 04/17/2022, elevated transaminases improved compared with 04/17/2022, hyperbilirubinemia which is slightly worse than on 04/17/2022, markedly elevated BNP not significantly changed, normal WBC and hemoglobin, moderate thrombocytopenia which is new.  Will need to repeat platelet count.  If thrombocytopenia is real, will need to be very careful with intravenous heparin to avoid heparin-induced thrombocytopenia.  Case is discussed with Dr. Alcario Drought of [facility], who agrees to admit the patient.  CRITICAL CARE Performed by: Delora Fuel Total critical care time: 40 minutes Critical care time was exclusive of separately billable procedures and treating other patients. Critical care was necessary to treat or prevent imminent or life-threatening  deterioration. Critical care was time spent personally by me on the following activities: development of treatment plan with patient and/or surrogate as well as nursing, discussions with consultants, evaluation of patient's response to treatment, examination of patient, obtaining history from patient or surrogate, ordering and performing treatments and interventions, ordering and review of laboratory studies, ordering and review of radiographic studies, pulse oximetry and re-evaluation of patient's condition.  Final Clinical Impression(s) / ED Diagnoses Final diagnoses:  Mural thrombus of left ventricle  Chronic combined systolic and diastolic heart failure (HCC)  Renal insufficiency  Thrombocytopenia (HCC)  Elevated liver function tests    Rx / DC Orders ED Discharge Orders     None         Delora Fuel, MD 82/42/35 [phone]  Patient got up to urinate, but fell as he tried to get back into bed.  He denies injury.  Exam shows no area of tenderness or hematoma.  However, because of anticoagulated state, I have ordered CT of head.  CT of head shows no acute injury.  I have independently viewed the images, and agree with radiologist interpretation.   CT Head Wo Contrast  Result Date: 05/18/2022 CLINICAL DATA:  Fall from bed, on blood thinners. EXAM: CT HEAD WITHOUT CONTRAST TECHNIQUE: Contiguous axial images were obtained from the base of the skull through  the vertex without intravenous contrast. RADIATION DOSE REDUCTION: This exam was performed according to the departmental dose-optimization program which includes automated exposure control, adjustment of the mA and/or kV according to patient size and/or use of iterative reconstruction technique. COMPARISON:  09/26/2019 brain MRI FINDINGS: Brain: No evidence of acute infarction, hemorrhage, hydrocephalus, extra-axial collection or mass lesion/mass effect. Chronic bilateral occipital (left more than right), posterior right frontal, and  bilateral cerebellar infarcts. Vascular: No hyperdense vessel or unexpected calcification. Skull: Normal. Negative for fracture or focal lesion. Sinuses/Orbits: No visible injury IMPRESSION: 1. No evidence of intracranial injury. 2. Multiple chronic infarcts including interval small right occipital cortex infarct since a 2021 comparison. Electronically Signed   By: Jorje Guild M.D.   On: 24/46/9507 22:57    Delora Fuel, MD 50/51/83 [phone]

## 2022-05-19 DIAGNOSIS — I480 Paroxysmal atrial fibrillation: Secondary | ICD-10-CM | POA: Diagnosis not present

## 2022-05-19 DIAGNOSIS — I513 Intracardiac thrombosis, not elsewhere classified: Secondary | ICD-10-CM | POA: Diagnosis not present

## 2022-05-19 DIAGNOSIS — R7401 Elevation of levels of liver transaminase levels: Secondary | ICD-10-CM | POA: Diagnosis not present

## 2022-05-19 DIAGNOSIS — I5023 Acute on chronic systolic (congestive) heart failure: Secondary | ICD-10-CM | POA: Diagnosis not present

## 2022-05-19 LAB — BASIC METABOLIC PANEL
Anion gap: 16 — ABNORMAL HIGH (ref 5–15)
BUN: 23 mg/dL (ref 8–23)
CO2: 28 mmol/L (ref 22–32)
Calcium: 8.8 mg/dL — ABNORMAL LOW (ref 8.9–10.3)
Chloride: 93 mmol/L — ABNORMAL LOW (ref 98–111)
Creatinine, Ser: 1.28 mg/dL — ABNORMAL HIGH (ref 0.61–1.24)
GFR, Estimated: >60 mL/min (ref >60)
Glucose, Bld: 84 mg/dL (ref 70–99)
Potassium: 3.3 mmol/L — ABNORMAL LOW (ref 3.5–5.1)
Sodium: 137 mmol/L (ref 135–145)

## 2022-05-19 LAB — COMPREHENSIVE METABOLIC PANEL
ALT: 137 U/L — ABNORMAL HIGH (ref 0–44)
AST: 104 U/L — ABNORMAL HIGH (ref 15–41)
Albumin: 2.7 g/dL — ABNORMAL LOW (ref 3.5–5.0)
Alkaline Phosphatase: 202 U/L — ABNORMAL HIGH (ref 38–126)
Anion gap: 10 (ref 5–15)
BUN: 24 mg/dL — ABNORMAL HIGH (ref 8–23)
CO2: 31 mmol/L (ref 22–32)
Calcium: 8.8 mg/dL — ABNORMAL LOW (ref 8.9–10.3)
Chloride: 94 mmol/L — ABNORMAL LOW (ref 98–111)
Creatinine, Ser: 1.26 mg/dL — ABNORMAL HIGH (ref 0.61–1.24)
GFR, Estimated: >60 mL/min (ref >60)
Glucose, Bld: 91 mg/dL (ref 70–99)
Potassium: 3.1 mmol/L — ABNORMAL LOW (ref 3.5–5.1)
Sodium: 135 mmol/L (ref 135–145)
Total Bilirubin: 2.1 mg/dL — ABNORMAL HIGH (ref 0.3–1.2)
Total Protein: 6.1 g/dL — ABNORMAL LOW (ref 6.5–8.1)

## 2022-05-19 LAB — TROPONIN I (HIGH SENSITIVITY): Troponin I (High Sensitivity): 28 ng/L — ABNORMAL HIGH (ref <18)

## 2022-05-19 LAB — MAGNESIUM: Magnesium: 1.4 mg/dL — ABNORMAL LOW (ref 1.7–2.4)

## 2022-05-19 MED ORDER — POTASSIUM CHLORIDE CRYS ER 20 MEQ PO TBCR
40.0000 meq | EXTENDED_RELEASE_TABLET | ORAL | Status: AC
  Administered 2022-05-19 (×2): 40 meq via ORAL
  Filled 2022-05-19 (×2): qty 2

## 2022-05-19 MED ORDER — MAGNESIUM SULFATE 2 GM/50ML IV SOLN
2.0000 g | Freq: Once | INTRAVENOUS | Status: AC
  Administered 2022-05-19: 2 g via INTRAVENOUS
  Filled 2022-05-19: qty 50

## 2022-05-19 MED ORDER — FUROSEMIDE 10 MG/ML IJ SOLN
40.0000 mg | Freq: Two times a day (BID) | INTRAMUSCULAR | Status: DC
  Administered 2022-05-20 – 2022-05-22 (×5): 40 mg via INTRAVENOUS
  Filled 2022-05-19 (×6): qty 4

## 2022-05-19 MED ORDER — LOSARTAN POTASSIUM 25 MG PO TABS
25.0000 mg | ORAL_TABLET | Freq: Every day | ORAL | Status: DC
  Administered 2022-05-19 – 2022-05-22 (×4): 25 mg via ORAL
  Filled 2022-05-19 (×4): qty 1

## 2022-05-19 MED ORDER — FUROSEMIDE 10 MG/ML IJ SOLN
40.0000 mg | Freq: Two times a day (BID) | INTRAMUSCULAR | Status: DC
  Administered 2022-05-19: 40 mg via INTRAVENOUS
  Filled 2022-05-19: qty 4

## 2022-05-19 NOTE — Progress Notes (Signed)
PROGRESS NOTE    Patient: Gregory Perry                            PCP: Bo Merino I, NP                    DOB: 1950-06-28            DOA: 05/17/2022 [id number]:[id number]             DOS: 05/19/2022, 12:51 PM   LOS: 1 day   Date of Service: The patient was seen and examined on 05/19/2022  Subjective:   The patient was seen and examined this morning, awake alert oriented, had multiple beats of V. tach overnight, electrolytes were checked, repleted   Brief Narrative:   KHAMAURI BAUERNFEIND is a 71 y.o. male with medical history significant of HTN, stroke, CAD, PAF on eliquis.   Was taking eliquis, but not adherent to any other meds as of Dr. Doug Sou office note 11/30.   Pt sent in to ED today from cards office after 2d echo showed worsened EF < 20% and L apical mural thrombus.   He has been having shortness of breath for several months with symptoms waxing and waning but no recent worsening. He has chronic edema which is also worsening. He does have some intermittent chest discomfort.     CXR: IMPRESSION: 1. Prominent interstitial opacities bilaterally, which could represent pulmonary venous congestion or atypical infection. 2. Cardiomegaly.   BNP 3932   2D Echo: IMPRESSIONS    1. Severe global reduction in LV function with large apical thrombus;  Compared to 09/27/19 LV function worse and apical thrombus new; pt sent to  ER by Dr Curt Bears.   2. Left ventricular ejection fraction, by estimation, is <20%. The left  ventricle has severely decreased function. The left ventricle demonstrates  global hypokinesis. The left ventricular internal cavity size was severely  dilated. Left ventricular  diastolic parameters are consistent with Grade III diastolic dysfunction  (restrictive).   3. Right ventricular systolic function is severely reduced. The right  ventricular size is moderately enlarged.   4. Left atrial size was severely dilated.   5. Right atrial size was severely  dilated.   6. A small pericardial effusion is present.   7. The mitral valve is normal in structure. Mild mitral valve  regurgitation. No evidence of mitral stenosis.   8. The aortic valve is tricuspid.     Assessment & Plan:   Principal Problem:   Acute on chronic HFrEF (heart failure with reduced ejection fraction) (HCC) Active Problems:   LV (left ventricular) mural thrombus   Transaminitis   Essential hypertension   Cardiomyopathy, ischemic   Paroxysmal atrial fibrillation (HCC)   Thrombocytopenia (HCC)     Assessment and Plan: * Acute on chronic HFrEF (heart failure with reduced ejection fraction) (HCC)  -improved lower extremity edema -Need to have shortness of breath at rest, with minimal exertion - Worsening EF due to ICM.  Now EF < 20% on Echo with LV thrombus.  Also grade 3 DD too. -Continue aggressive diuresis per CHF pathway -Currently on Lasix 40 mg IV daily -Monitoring I's and O's, daily weight, telemetry monitoring  Intake/Output Summary (Last 24 hours) at 05/19/2022 1245 Last data filed at 05/19/2022 1013 Gross per 24 hour  Intake 1122.03 ml  Output 4100 ml  Net -2977.97 ml     -Continue heparin drip >>> switching  to p.o. Xarelto per cardiology recommendation -Cardiology following closely -added low-dose ARB, holding beta-blocker due to low cardiac output, initiated digoxin -Stating prognosis poor -Monitor kidney function closely - ?  Need for TEE, cath, AICD   LV (left ventricular) mural thrombus Due to Non-adhearance to eliquis: Per cardiology considering switching to Xarelto once a day therefore within the next 48 hours  We dont think that this represents a failure of DOAC therapy at this point. Heparin gtt will discontinue 05/19/2022 Switching to Xarelto today 05/19/2022   Transaminitis ? Hepatic congestion due to severe CHF? Infarct from embolus given mural thrombus? Not a fan of the fact that he's basically had painless Jaundice for  past 1 year...? ETOH abuse  Check hepatitis panel [place] in Jan unrevealing, but probably warrants repeat imaging at this point. Consider MRCP when he's more able to lay flat for it (too short of breath at the moment)    Latest Ref Rng & Units 05/19/2022    3:54 AM 05/18/2022    2:33 AM 05/17/2022    2:08 PM  Hepatic Function  Total Protein 6.5 - 8.1 g/dL 6.1  <3.0  6.9   Albumin 3.5 - 5.0 g/dL 2.7  <1.5  3.1   AST 15 - 41 U/L 104  31  132   ALT 0 - 44 U/L 137  35  141   Alk Phosphatase 38 - 126 U/L 202  55  253   Total Bilirubin 0.3 - 1.2 mg/dL 2.1  0.5  3.2   Bilirubin, Direct 0.0 - 0.2 mg/dL  0.2     -Questionable history of EtOH abuse, likely exacerbated congestive hepatopathy - severe CHF now  -Ending hepatitis panel -Monitor LFTs closely -Avoiding hepatotoxins  Thrombocytopenia ([place]) Possibly due to platelets being consumed by the LV thrombus formation? Alternatively given 1 year h/o LFT elevations, maybe secondary to hypersplenism from portal venous hypertension? -Monitoring closely on heparin drip --- discontinue Initiating Xarelto, -Trend platelets closely  Paroxysmal atrial fibrillation (HCC) PAF discovered on loop recorder (placed following cryptogenic stroke). Heparin gtt >> switching to Xarelto Supposed to be on chronic eliquis .Marland Kitchen  Noncompliant Mild thrombocytopenia-may be at increased risk of bleeding  Cardiomyopathy, ischemic -Currently stable, continue management as above, IV Lasix, -Holding, beta-blockers due to low EF, -Small dose of ARB, digoxin was added by cardiology today  Essential hypertension Sounds like non-adherent to home BP meds previously per Dr. [name] 11/30 note. BP on soft side today and mild AKI, was holding losartan  But holding beta-blockers due to low EF BP remained stable  today       ----------------------------------------------------------------------------------------------------------------------------------------------- Nutritional status:  The patient's BMI is: Body mass index is 20.94 kg/m. I agree with the assessment and plan as outlined ------------------------------------------------------------------------------------------------------------------------------------  DVT prophylaxis:  Place TED hose Start: 05/18/22 1646Heparin drip rivaroxaban (XARELTO) tablet 20 mg   Code Status:   Code Status: Full Code  Family Communication: 2 family members present at bedside, updated  Goals of care: The above findings and plan of care has been discussed with patient (and family)  in detail,  they expressed understanding and agreement of above. -Advance care planning has been discussed.  -Palliative care consulted-following closely Prognosis remain poor  Admission status:   Status is: Inpatient Remains inpatient appropriate because: Needing IV heparin, further evaluation for apical thrombus     Procedures:   No admission procedures for hospital encounter.   Antimicrobials:  Anti-infectives (From admission, onward)    None  Medication:   atorvastatin  40 mg Oral Daily   digoxin  0.125 mg Oral Daily   furosemide  40 mg Intravenous BID   losartan  25 mg Oral Daily   rivaroxaban  20 mg Oral Q breakfast   sodium chloride flush  3 mL Intravenous Q12H   spironolactone  12.5 mg Oral Daily    sodium chloride, acetaminophen, ondansetron (ZOFRAN) IV, sodium chloride flush   Objective:   Vitals:   05/19/22 0528 05/19/22 0633 05/19/22 0833 05/19/22 1221  BP: 99/80 (!) 125/98 (!) 117/98 112/81  Pulse: 75 80 88 85  Resp: _0 (!) 21  Temp:   (!) 97.4 F (36.3 C) (!) 97.5 F (36.4 C)  TempSrc:   Oral Oral  SpO2: 92% 90% 97% 100%  Weight:      Height:        Intake/Output Summary (Last 24 hours) at 05/19/2022  1251 Last data filed at 05/19/2022 1013 Gross per 24 hour  Intake 1122.03 ml  Output 4100 ml  Net -2977.97 ml   Filed Weights   05/18/22 0141 05/19/22 0446  Weight: 68 kg 68.1 kg     Examination:      General:  Chronically ill looking male, AAO x 3,  cooperative, no distress;   HEENT:  Normocephalic, PERRL, otherwise with in Normal limits   Neuro:  CNII-XII intact. , normal motor and sensation, reflexes intact   Lungs:   Clear to auscultation BL, Respirations unlabored,  No wheezes / crackles  Cardio:    S1/S2, RRR, No murmure, No Rubs or Gallops   Abdomen:  Soft, non-tender, bowel sounds active all four quadrants, no guarding or peritoneal signs.  Muscular  skeletal:  Limited exam -global generalized weaknesses - in bed, able to move all 4 extremities,   2+ pulses,  symmetric, No pitting edema  Skin:  Dry, warm to touch, negative for any Rashes,  Wounds: Please see nursing documentation  Pressure Injury 05/18/22 Buttocks Left Stage 2 -  Partial thickness loss of dermis presenting as a shallow open injury with a red, pink wound bed without slough. (Active)  05/18/22 1603  Location: Buttocks  Location Orientation: Left  Staging: Stage 2 -  Partial thickness loss of dermis presenting as a shallow open injury with a red, pink wound bed without slough.  Wound Description (Comments):   Present on Admission: Yes  Dressing Type Foam - Lift dressing to assess site every shift 05/19/22 0955         ------------------------------------------------------------------------------------------------------------------------------------------    LABs:     Latest Ref Rng & Units 05/18/2022    5:30 AM 05/17/2022    2:08 PM 04/17/2022    1:42 PM  CBC  WBC 4.0 - 10.5 K/uL 6.2  5.3  7.4   Hemoglobin 13.0 - 17.0 g/dL 14.8  15.2  12.5   Hematocrit 39.0 - 52.0 % 43.5  46.8  37.6   Platelets 150 - 400 K/uL 88  83  229       Latest Ref Rng & Units 05/19/2022    7:02 AM 05/19/2022     3:54 AM 05/18/2022    2:33 AM  CMP  Glucose 70 - 99 mg/dL 84  91    BUN 8 - 23 mg/dL 23  24    Creatinine 0.61 - 1.24 mg/dL 1.28  1.26    Sodium 135 - 145 mmol/L 137  135    Potassium 3.5 - 5.1 mmol/L 3.3  3.1  Chloride 98 - 111 mmol/L 93  94    CO2 22 - 32 mmol/L 28  31    Calcium 8.9 - 10.3 mg/dL 8.8  8.8    Total Protein 6.5 - 8.1 g/dL  6.1  <3.0   Total Bilirubin 0.3 - 1.2 mg/dL  2.1  0.5   Alkaline Phos 38 - 126 U/L  202  55   AST 15 - 41 U/L  104  31   ALT 0 - 44 U/L  137  35        Micro Results No results found for this or any previous visit (from the past 240 hour(s)).  Radiology Reports No results found.  SIGNED: Deatra James, MD, FHM. [facility],  Pager (please use [email or web address] to page/text) Please use Epic Secure Chat for non-urgent communication (7AM-7PM)  If 7PM-7AM, please contact night-coverage [email or web address], 05/19/2022, 12:51 PM

## 2022-05-19 NOTE — Progress Notes (Signed)
Patient develop 4 beats of V tach,has chest pain level of 8 in the scale of 0-10.MD notified,advised for EKG.

## 2022-05-19 NOTE — Evaluation (Signed)
Occupational Therapy Evaluation Patient Details Name: Gregory Perry MRN: [id number] DOB: 1951/05/21 Today's Date: 05/19/2022   History of Present Illness Pt is a 71 y.o. male who presented 05/17/22 as referral from cardiology due to SOB and echocardiogram showing LV mural thrombus and worsening HF. PMH: HTN, CVA, CAD, PAF on eliquis, alcohol abuse, CHF, prostate cancer   Clinical Impression   PTA, pt lived with siblings who assisted with IADL, but pt was mod I in ADL. Upon eval, pt requiring min A for transfers and up to mod A ~55f functional ambulation. Min-mod A to don socks sitting EOB due to decreased core strength and FM dexterity. Pt with decreased activity tolerance and reporting winded upon initial transition to EOB and initial STS transfer, however, no notable increase in work of breathing. Due to pt's need for physical assist for ADL and significantly decreased activity tolerance, recommending ST-SNF for continued OT services.      Recommendations for follow up therapy are one component of a multi-disciplinary discharge planning process, led by the attending physician.  Recommendations may be updated based on patient status, additional functional criteria and insurance authorization.   Follow Up Recommendations  Skilled nursing-short term rehab (<3 hours/day)     Assistance Recommended at Discharge Intermittent Supervision/Assistance  Patient can return home with the following A little help with walking and/or transfers;A lot of help with bathing/dressing/bathroom;Assistance with cooking/housework;Direct supervision/assist for medications management;Direct supervision/assist for financial management;Help with stairs or ramp for entrance;Assist for transportation    Functional Status Assessment  Patient has had a recent decline in their functional status and demonstrates the ability to make significant improvements in function in a reasonable and predictable amount of time.   Equipment Recommendations  Other (comment) (defer to next venue)    Recommendations for Other Services       Precautions / Restrictions Precautions Precautions: Fall;Other (comment) Precaution Comments: watch BP and SpO2 Restrictions Weight Bearing Restrictions: No      Mobility Bed Mobility Overal bed mobility: Needs Assistance Bed Mobility: Supine to Sit, Sit to Supine     Supine to sit: Min guard, HOB elevated Sit to supine: Min guard, HOB elevated   General bed mobility comments: Min guard for safety, extra time to complete, HOB elevated    Transfers Overall transfer level: Needs assistance Equipment used: Rolling walker (2 wheels), None Transfers: Sit to/from Stand, Bed to chair/wheelchair/BSC Sit to Stand: Min assist     Step pivot transfers: Min assist     General transfer comment: Posterior bias noted but pt quickly corrects, minA to prevent LOB coming to stand.      Balance Overall balance assessment: Needs assistance Sitting-balance support: No upper extremity supported, Feet supported Sitting balance-Leahy Scale: Fair     Standing balance support: No upper extremity supported, Bilateral upper extremity supported, During functional activity Standing balance-Leahy Scale: Fair Standing balance comment: Able to stand statically with min guard-minA without UE support, benefits from RW and physical assistance to prevent LOB                           ADL either performed or assessed with clinical judgement   ADL Overall ADL's : Needs assistance/impaired Eating/Feeding: Set up;Sitting   Grooming: Standing;Minimal assistance Grooming Details (indicate cue type and reason): for balance Upper Body Bathing: Set up;Sitting   Lower Body Bathing: Minimal assistance;Sit to/from stand   Upper Body Dressing : Set up;Sitting   Lower Body Dressing: Minimal  assistance;Sit to/from stand   Toilet Transfer: Minimal assistance;Moderate  assistance;Ambulation;BSC/3in1 (ambulation of ~57f this session)           Functional mobility during ADLs: Minimal assistance;Moderate assistance;Cueing for safety General ADL Comments: Min-mod A for short distance functional ambulation     Vision Patient Visual Report: Other (comment);No change from baseline (Pt has visual deficits at baseline)       Perception     Praxis      Pertinent Vitals/Pain Pain Assessment Pain Assessment: Faces Faces Pain Scale: Hurts a little bit Pain Location: bil feet in standing Pain Descriptors / Indicators: Pressure Pain Intervention(s): Monitored during session     Hand Dominance     Extremity/Trunk Assessment Upper Extremity Assessment Upper Extremity Assessment: Generalized weakness (3+/5 grip strength; 4- push/pull)   Lower Extremity Assessment Lower Extremity Assessment: Defer to PT evaluation   Cervical / Trunk Assessment Cervical / Trunk Assessment: Kyphotic   Communication Communication Communication: No difficulties   Cognition Arousal/Alertness: Awake/alert Behavior During Therapy: WFL for tasks assessed/performed Overall Cognitive Status: Within Functional Limits for tasks assessed                                 General Comments: Slightly slowed processing and questionable memory but likely close to baseline     General Comments  Pt with min complaints of SOB and no complaints of dizziness. SpO2 monitor with poor reading and not displaying despite also use of personal pulse ox. 99% on each reading displayed.    Exercises Exercises: Other exercises Other Exercises Other Exercises: STSx3; Pt required min A for steadying on all attempts   Shoulder Instructions      Home Living Family/patient expects to be discharged to:: Private residence Living Arrangements: Other relatives (x2 brothers and x1 sister) Available Help at Discharge: Family;Available 24 hours/day Type of Home: House Home Access:  Stairs to enter [employer] Steps: 2 Entrance Stairs-Rails: None Home Layout: One level     Bathroom Shower/Tub: TTeacher, early years/pre Handicapped height Bathroom Accessibility: No   Home Equipment: BSC/3in1;Grab bars - tub/shower          Prior Functioning/Environment Prior Level of Function : Needs assist             Mobility Comments: Mod I with no AD, intermittently holds onto furniture for support. Some days can only walk ~20 ft at a time and other days can walk > 100 ft at a time. ADLs Comments: Siblings assisted with shopping and driving, otherwise pt was mod I for ADLs; impaired sight per pt. Pt reports sister assisted with medication and financial management        OT Problem List: Decreased strength;Decreased activity tolerance;Impaired balance (sitting and/or standing);Impaired vision/perception;Decreased cognition;Decreased safety awareness;Decreased knowledge of use of DME or AE;Cardiopulmonary status limiting activity      OT Treatment/Interventions: Self-care/ADL training;Therapeutic exercise;DME and/or AE instruction;Therapeutic activities;Cognitive remediation/compensation;Patient/family education;Balance training    OT Goals(Current goals can be found in the care plan section) Acute Rehab OT Goals Patient Stated Goal: get better so I can go home OT Goal Formulation: With patient Time For Goal Achievement: 06/02/22 Potential to Achieve Goals: Good  OT Frequency: Min 2X/week    Co-evaluation              AM-PAC OT "6 Clicks" Daily Activity     Outcome Measure Help from another person eating meals?: None Help from  another person taking care of personal grooming?: A Little Help from another person toileting, which includes using toliet, bedpan, or urinal?: A Lot Help from another person bathing (including washing, rinsing, drying)?: A Little Help from another person to put on and taking off regular upper body clothing?: A  Little Help from another person to put on and taking off regular lower body clothing?: A Lot 6 Click Score: 17   End of Session Equipment Utilized During Treatment: Gait belt Nurse Communication: Mobility status;Other (comment) (Pt sitting EOB with family present)  Activity Tolerance: Patient tolerated treatment well Patient left: in bed;with call bell/phone within reach;with bed alarm set  OT Visit Diagnosis: Unsteadiness on feet (R26.81);Muscle weakness (generalized) (M62.81);Other abnormalities of gait and mobility (R26.89);Dizziness and giddiness (R42)                Time: [id number] OT Time Calculation (min): 31 min Charges:  OT General Charges $OT Visit: 1 Visit OT Evaluation $OT Eval Moderate Complexity: 1 Mod OT Treatments $Self Care/Home Management : 8-22 mins  Elder Cyphers, OTR/L [facility] Acute Rehabilitation Office: [phone]    Magnus Ivan 05/19/2022, 1:42 PM

## 2022-05-19 NOTE — Progress Notes (Signed)
Primary Cardiologist:  Perry  Subjective:  Generalized body aches Dyspnea persists   Objective:  Vitals:   05/19/22 0446 05/19/22 0528 05/19/22 0633 05/19/22 0833  BP:  99/80 (!) 125/98 (!) 117/98  Pulse:  75 80 88  Resp:  '17 20 17  '$ Temp:    (!) 97.4 F (36.3 C)  TempSrc:    Oral  SpO2:  92% 90% 97%  Weight: 68.1 kg     Height:        Intake/Output from previous day:  Intake/Output Summary (Last 24 hours) at 05/19/2022 1012 Last data filed at 05/19/2022 1009 Gross per 24 hour  Intake 882.03 ml  Output 4100 ml  Net -3217.97 ml    Physical Exam: Disheveled black male Tachypnic JVP elevated PMI enlarged  Apical MR murmur  Positive HJR no ascites  No  edema Prior bed bugs with dry skin and excoriations  ILR under left pre pectoral area   Lab Results: Basic Metabolic Panel: Recent Labs    05/17/22 1408 05/19/22 0354  NA 140 135  K 4.2 3.1*  CL 102 94*  CO2 21* 31  GLUCOSE 99 91  BUN 25* 24*  CREATININE 1.39* 1.26*  CALCIUM 9.1 8.8*  MG  --  1.4*   Liver Function Tests: Recent Labs    05/18/22 0233 05/19/22 0354  AST 31 104*  ALT 35 137*  ALKPHOS 55 202*  BILITOT 0.5 2.1*  PROT <3.0* 6.1*  ALBUMIN <1.5* 2.7*   No results for input(s): "LIPASE", "AMYLASE" in the last 72 hours. CBC: Recent Labs    05/17/22 1408 05/18/22 0530  WBC 5.3 6.2  NEUTROABS 3.8  --   HGB 15.2 14.8  HCT 46.8 43.5  MCV 96.9 94.2  PLT 83* 88*     Imaging: CT Head Wo Contrast  Result Date: 05/18/2022 CLINICAL DATA:  Fall from bed, on blood thinners. EXAM: CT HEAD WITHOUT CONTRAST TECHNIQUE: Contiguous axial images were obtained from the base of the skull through the vertex without intravenous contrast. RADIATION DOSE REDUCTION: This exam was performed according to the departmental dose-optimization program which includes automated exposure control, adjustment of the mA and/or kV according to patient size and/or use of iterative reconstruction technique.  COMPARISON:  09/26/2019 brain MRI FINDINGS: Brain: No evidence of acute infarction, hemorrhage, hydrocephalus, extra-axial collection or mass lesion/mass effect. Chronic bilateral occipital (left more than right), posterior right frontal, and bilateral cerebellar infarcts. Vascular: No hyperdense vessel or unexpected calcification. Skull: Normal. Negative for fracture or focal lesion. Sinuses/Orbits: No visible injury IMPRESSION: 1. No evidence of intracranial injury. 2. Multiple chronic infarcts including interval small right occipital cortex infarct since a 2021 comparison. Electronically Signed   By: Gregory Perry Gregory.D.   On: 05/18/2022 04:24   DG Chest 2 View  Result Date: 05/17/2022 CLINICAL DATA:  SOB EXAM: CHEST - 2 VIEW COMPARISON:  04/17/22 FINDINGS: Cardiomegaly. No pleural effusion. No pneumothorax. No focal airspace opacity. Loop recorder device in place. Prominent interstitial opacities bilaterally, which could represent pulmonary venous congestion or atypical infection. No displaced rib fractures. Visualized upper abdomen is unremarkable. IMPRESSION: 1. Prominent interstitial opacities bilaterally, which could represent pulmonary venous congestion or atypical infection. 2. Cardiomegaly. Electronically Signed   By: Gregory Perry Gregory.D.   On: 05/17/2022 14:35   ECHOCARDIOGRAM COMPLETE  Result Date: 05/17/2022    ECHOCARDIOGRAM REPORT   Patient Name:   Gregory Perry Date of Exam: 05/17/2022 Medical Rec #:  [id number]  Height:       71.0 in Accession #:    [id number]        Weight:       103.0 lb Date of Birth:  1951-02-07          BSA:          1.590 Gregory Patient Age:    71 years          BP:           138/80 mmHg Patient Gender: Gregory                 HR:           97 bpm. Exam Location:  [facility] Procedure: 2D Echo, Cardiac Doppler, Color Doppler and Intracardiac            Opacification Agent                      STAT ECHO Reported to: Dr. Curt Perry on 05/17/2022 12:30:00 PM. Indications:     Congestive Heart Failure I50.9  History:        Patient has prior history of Echocardiogram examinations, most                 recent 09/27/2019. CAD; Risk Factors:Hypertension.  Sonographer:    Gregory Perry RDCS Referring Phys: [id number] Gregory Perry Gregory Perry IMPRESSIONS  1. Severe global reduction in LV function with large apical thrombus; Compared to 09/27/19 LV function worse and apical thrombus new; pt sent to ER by Dr Gregory Perry.  2. Left ventricular ejection fraction, by estimation, is <20%. The left ventricle has severely decreased function. The left ventricle demonstrates global hypokinesis. The left ventricular internal cavity size was severely dilated. Left ventricular diastolic parameters are consistent with Grade III diastolic dysfunction (restrictive).  3. Right ventricular systolic function is severely reduced. The right ventricular size is moderately enlarged.  4. Left atrial size was severely dilated.  5. Right atrial size was severely dilated.  6. A small pericardial effusion is present.  7. The mitral valve is normal in structure. Mild mitral valve regurgitation. No evidence of mitral stenosis.  8. The aortic valve is tricuspid. Aortic valve regurgitation is mild. Aortic valve sclerosis is present, with no evidence of aortic valve stenosis.  9. The inferior vena cava is dilated in size with <50% respiratory variability, suggesting right atrial pressure of 15 mmHg. FINDINGS  Left Ventricle: Left ventricular ejection fraction, by estimation, is <20%. The left ventricle has severely decreased function. The left ventricle demonstrates global hypokinesis. Definity contrast agent was given IV to delineate the left ventricular endocardial borders. The left ventricular internal cavity size was severely dilated. There is no left ventricular hypertrophy. Left ventricular diastolic parameters are consistent with Grade III diastolic dysfunction (restrictive). Right Ventricle: The right ventricular size is moderately  enlarged. Right ventricular systolic function is severely reduced. Left Atrium: Left atrial size was severely dilated. Right Atrium: Right atrial size was severely dilated. Pericardium: A small pericardial effusion is present. Mitral Valve: The mitral valve is normal in structure. Mild mitral valve regurgitation. No evidence of mitral valve stenosis. Tricuspid Valve: The tricuspid valve is normal in structure. Tricuspid valve regurgitation is mild . No evidence of tricuspid stenosis. Aortic Valve: The aortic valve is tricuspid. Aortic valve regurgitation is mild. Aortic valve sclerosis is present, with no evidence of aortic valve stenosis. Pulmonic Valve: The pulmonic valve was normal in structure. Pulmonic valve regurgitation is trivial. No evidence of pulmonic  stenosis. Aorta: The aortic root is normal in size and structure. Venous: The inferior vena cava is dilated in size with less than 50% respiratory variability, suggesting right atrial pressure of 15 mmHg. IAS/Shunts: No atrial level shunt detected by color flow Doppler. Additional Comments: Severe global reduction in LV function with large apical thrombus; Compared to 09/27/19 LV function worse and apical thrombus new; pt sent to ER by Dr Gregory Perry.  LEFT VENTRICLE PLAX 2D LVIDd:         6.60 cm LVIDs:         6.40 cm LV PW:         1.10 cm LV IVS:        1.00 cm LVOT diam:     2.20 cm LV SV:         27 LV SV Index:   17 LVOT Area:     3.80 cm  RIGHT VENTRICLE RV Basal diam:  5.10 cm RV Mid diam:    3.50 cm RV S prime:     3.98 cm/s TAPSE (Gregory-mode): 1.0 cm LEFT ATRIUM              Index        RIGHT ATRIUM           Index LA diam:        4.80 cm  3.02 cm/Gregory   RA Area:     26.10 cm LA Vol (A2C):   102.0 ml 64.14 ml/Gregory  RA Volume:   92.10 ml  57.91 ml/Gregory LA Vol (A4C):   83.0 ml  52.19 ml/Gregory LA Biplane Vol: 91.6 ml  57.60 ml/Gregory  AORTIC VALVE LVOT Vmax:   49.40 cm/s LVOT Vmean:  28.450 cm/s LVOT VTI:    0.072 Gregory  AORTA Ao Root diam: 3.40 cm TRICUSPID VALVE TR Peak  grad:   32.9 mmHg TR Vmax:        287.00 cm/s  SHUNTS Systemic VTI:  0.07 Gregory Systemic Diam: 2.20 cm Kirk Ruths MD Electronically signed by Kirk Ruths MD Signature Date/Time: 05/17/2022/12:54:05 PM    Final     Cardiac Studies:  ECG: ST rate 112 no acute ST changes    Telemetry: Sinus with frequent PAC/PVCls   Echo: 12/22  EF 20% large mural apical thrombus severe RV dysfunction   Medications:    atorvastatin  40 mg Oral Daily   digoxin  0.125 mg Oral Daily   furosemide  40 mg Intravenous BID   rivaroxaban  20 mg Oral Q breakfast   sodium chloride flush  3 mL Intravenous Q12H   spironolactone  12.5 mg Oral Daily      sodium chloride      Assessment/Plan:  CHF:  acute on chronic systolic Likely non ischemic Prior cath 2017 non obstructive Dx. Non compliant with meds ? Ongoing ETOH. TTE with EF now < 20% low output congestive. Bad prognostic signs with tachycardia and elevated LFTls. Continue lasix iv daily Cr is better today 1.26 add low dose ARB Hold beta blocker with low output Advanced CHF has seen and indicate not a candidate for advanced Rx;s Palliative Care Digoxin added  LV Thrombus:   Given compliance issues not a good coumadin candidate Not compliant with eliquis Started on Xarelto  Elevated LFT;s: Contribution from congestive hepatopathy but TB up and more chronic  ETOH history Hepatitis panel pending [place] 06/06/21 negative would repeat as starter  CVAls prior with PAF and ILR in NSR now non compliant with DOAC  Thrombocytopenia:  increased risk of bleeding likely splenic sequestration follow   Severe Protein calorie malnutrition- Albumin < 1.5 TP <3 Dietary consult  Spoke with both sisters on phone and indicated Palliative Care Primary service to arrange goals of care should be DNR   Jenkins Rouge 05/19/2022, 10:12 AM

## 2022-05-20 DIAGNOSIS — I5023 Acute on chronic systolic (congestive) heart failure: Secondary | ICD-10-CM | POA: Diagnosis not present

## 2022-05-20 LAB — CBC
HCT: 38.8 % — ABNORMAL LOW (ref 39.0–52.0)
Hemoglobin: 13 g/dL (ref 13.0–17.0)
MCH: 31.6 pg (ref 26.0–34.0)
MCHC: 33.5 g/dL (ref 30.0–36.0)
MCV: 94.4 fL (ref 80.0–100.0)
Platelets: 95 10*3/uL — ABNORMAL LOW (ref 150–400)
RBC: 4.11 MIL/uL — ABNORMAL LOW (ref 4.22–5.81)
RDW: 14.4 % (ref 11.5–15.5)
WBC: 5.1 10*3/uL (ref 4.0–10.5)
nRBC: 0 % (ref 0.0–0.2)

## 2022-05-20 LAB — COMPREHENSIVE METABOLIC PANEL
ALT: 136 U/L — ABNORMAL HIGH (ref 0–44)
AST: 92 U/L — ABNORMAL HIGH (ref 15–41)
Albumin: 2.5 g/dL — ABNORMAL LOW (ref 3.5–5.0)
Alkaline Phosphatase: 179 U/L — ABNORMAL HIGH (ref 38–126)
Anion gap: 8 (ref 5–15)
BUN: 22 mg/dL (ref 8–23)
CO2: 34 mmol/L — ABNORMAL HIGH (ref 22–32)
Calcium: 8.2 mg/dL — ABNORMAL LOW (ref 8.9–10.3)
Chloride: 91 mmol/L — ABNORMAL LOW (ref 98–111)
Creatinine, Ser: 1.19 mg/dL (ref 0.61–1.24)
GFR, Estimated: >60 mL/min (ref >60)
Glucose, Bld: 97 mg/dL (ref 70–99)
Potassium: 3.8 mmol/L (ref 3.5–5.1)
Sodium: 133 mmol/L — ABNORMAL LOW (ref 135–145)
Total Bilirubin: 1.5 mg/dL — ABNORMAL HIGH (ref 0.3–1.2)
Total Protein: 5.6 g/dL — ABNORMAL LOW (ref 6.5–8.1)

## 2022-05-20 LAB — BRAIN NATRIURETIC PEPTIDE: B Natriuretic Peptide: 3749.5 pg/mL — ABNORMAL HIGH (ref 0.0–100.0)

## 2022-05-20 NOTE — Progress Notes (Signed)
Daily Progress Note   Patient Name: Gregory Perry       Date: 05/20/2022 DOB: 07-02-1950  Age: 71 y.o. MRN#: [id number] Attending Physician: Deatra James, MD Primary Care Physician: Teena Dunk, NP Admit Date: 05/17/2022  Reason for Consultation/Follow-up: Establishing goals of care  Subjective: Medical records reviewed including progress notes, labs. Patient assessed at the bedside.  He has no complaints this morning.  His sister and nephew are present visiting.  Introduced the role of palliative medicine to patient's family.  He does not really recall meeting with me in the ED when he was admitted.  Explored his understanding of the updates received from cardiology and his primary attending this morning.  He states he understands "nothing."  His family was present and states that he has a bad memory.  They also feel that he does not want to remember what he was told.    Patient gives permission for this PA to review his medical illness and we discussed his end-stage heart failure and poor prognosis, despite optimization of medical therapy.  Counseled on the high risk of another stroke and other complications due to LV thrombus and overall poor health state.  Emphasized the importance of ongoing goals of care discussions, aligning his care preferences with treatment options moving forward.    He is very eager to go home and enjoy living his life the best he can, understanding he likely does not have a great deal of time to live with or without ongoing medical interventions.  Reviewed options for support in the home including hospice.  Strongly recommended consideration of referral to hospice prior to discharge, providing education that patients with end-stage/terminal illnesses  often live longer and with greater quality of life than patients who do not have this specialized support.  Counseled that the goal of hospice is to avoid suffering and preserve peace and dignity, allowing the natural disease process to take its course.  Patient's family is in support of this, however he would like more time to consider his options.  We discussed that many aggressive interventions would likely cause him more harm than benefit, including CPR and mechanical ventilation.  Strongly recommended consideration of DNR status, explaining evidenced-based poor outcomes in similar hospitalized patients, as the cause of the arrest is likely associated with chronic/terminal disease  rather than a reversible acute cardio-pulmonary event.  Patient states that he would be open to CPR "if needed and so that I can live" we talked about what "living" would look like after CPR if this is even successful.  He becomes very emotional and tearful.  He is having a hard time coming to terms with the results of his choices and does not want to think about dying as a result of them.  Emotional support and therapeutic listening was provided.  He will continue reflecting on this as well for further discussion tomorrow.  Reviewed his thoughts on HCPOA and he confirms he would like to name his nephew Freida Busman.  Advance directive documents provided for further review.  Questions and concerns addressed. PMT will continue to support holistically.   Length of Stay: 2  Physical Exam Vitals and nursing note reviewed.  Constitutional:      General: He is not in acute distress.    Appearance: He is ill-appearing.     Interventions: Nasal cannula in place.  Cardiovascular:     Rate and Rhythm: Tachycardia present.  Pulmonary:     Effort: Pulmonary effort is normal.  Skin:    General: Skin is warm and dry.  Neurological:     Mental Status: He is alert.  Psychiatric:        Mood and Affect: Mood normal.        Behavior:  Behavior normal.        Thought Content: Thought content normal.        Cognition and Memory: He exhibits impaired recent memory.      Vital Signs: BP 139/74 (BP Location: Left Arm)   Pulse 84   Temp 98.5 F (36.9 C) (Oral)   Resp 16   Ht '5\' 11"'$  (1.803 m)   Wt 68 kg   SpO2 100%   BMI 20.91 kg/m  SpO2: SpO2: 100 % O2 Device: O2 Device: Nasal Cannula O2 Flow Rate: O2 Flow Rate (L/min): 1 L/min      Palliative Assessment/Data: 50%   Palliative Care Assessment & Plan   Patient Profile: 71 y.o. male  with past medical history of  hypertension, stroke, CAD, PAF on Eliquis admitted on 05/17/2022 with EF of less than 20% on 2D echo in outpatient clinic.    Patient has not been adherent to Eliquis. He is now admitted with acute on chronic HFrEF with LV thrombus and grade 3 diastolic dysfunction.  PMT has been consulted to assist with goals of care conversation.  Assessment: Goals of care conversatio End stage CHF, acute exacerbation PAF on Eliquis  LV Thrombus  Hx of CVA ( x2)   Recommendations/Plan: Continue full code/full scope treatment Patient's sister and nephew are in support of DNR and hospice, however patient himself is having a very difficult time coming to terms with his mortality and poor prognosis.  He is open to the idea but wishes to reflect further before making any final decisions Psychosocial and emotional support provided Spiritual care consult for advance directives, patient wants to name his nephew Freida Busman as Chauncey Reading PMT will continue to follow and support   Prognosis:  < 6 months  Discharge Planning: To Be Determined  Care plan was discussed with patient, patient's sister, patient's nephew, Dr. Roger Shelter   MDM high         Ages, PA-C  Palliative Medicine Team Team phone # [phone]  Thank you for allowing the Palliative Medicine Team to assist in the care  of this patient. Please utilize secure chat with additional questions, if  there is no response within 30 minutes please call the above phone number.  Palliative Medicine Team providers are available by phone from 7am to 7pm daily and can be reached through the team cell phone.  Should this patient require assistance outside of these hours, please call the patient's attending physician.

## 2022-05-20 NOTE — Progress Notes (Signed)
PROGRESS NOTE    Patient: Gregory Perry                            PCP: Bo Merino I, NP                    DOB: 09/20/1950            DOA: 05/17/2022 YDX:[id number]             DOS: 05/20/2022, 10:40 AM   LOS: 2 days   Date of Service: The patient was seen and examined on 05/20/2022  Subjective:   The patient was seen and examined this morning, family member present at bedside No issues overnight, hemodynamically stable  Extensive discussion with cardiology and family who were present at bedside   Brief Narrative:   Gregory Perry is a 71 y.o. male with medical history significant of HTN, stroke, CAD, PAF on eliquis.   Was taking eliquis, but not adherent to any other meds as of Dr. Doug Sou office note 11/30.   Pt sent in to ED today from cards office after 2d echo showed worsened EF < 20% and L apical mural thrombus.   He has been having shortness of breath for several months with symptoms waxing and waning but no recent worsening. He has chronic edema which is also worsening. He does have some intermittent chest discomfort.     CXR: IMPRESSION: 1. Prominent interstitial opacities bilaterally, which could represent pulmonary venous congestion or atypical infection. 2. Cardiomegaly.   BNP 3932   2D Echo: IMPRESSIONS    1. Severe global reduction in LV function with large apical thrombus;  Compared to 09/27/19 LV function worse and apical thrombus new; pt sent to  ER by Dr Curt Bears.   2. Left ventricular ejection fraction, by estimation, is <20%. The left  ventricle has severely decreased function. The left ventricle demonstrates  global hypokinesis. The left ventricular internal cavity size was severely  dilated. Left ventricular  diastolic parameters are consistent with Grade III diastolic dysfunction  (restrictive).   3. Right ventricular systolic function is severely reduced. The right  ventricular size is moderately enlarged.   4. Left atrial size was  severely dilated.   5. Right atrial size was severely dilated.   6. A small pericardial effusion is present.   7. The mitral valve is normal in structure. Mild mitral valve  regurgitation. No evidence of mitral stenosis.   8. The aortic valve is tricuspid.     Assessment & Plan:   Principal Problem:   Acute on chronic HFrEF (heart failure with reduced ejection fraction) (HCC) Active Problems:   LV (left ventricular) mural thrombus   Transaminitis   Essential hypertension   Cardiomyopathy, ischemic   Paroxysmal atrial fibrillation (HCC)   Thrombocytopenia (HCC)     Assessment and Plan: * Acute on chronic HFrEF (heart failure with reduced ejection fraction) (HCC) -Improved shortness of breath, lower extremity edema -Still complain of shortness of breath with minimal exertion  - Worsening EF due to ICM.  Now EF < 20% on Echo with LV thrombus.  Also grade 3 DD too. -Continue aggressive diuresis per CHF pathway -Currently on Lasix 40 mg IV daily -Monitoring I's and O's, daily weight, telemetry monitoring   Intake/Output Summary (Last 24 hours) at 05/20/2022 1033 Last data filed at 05/20/2022 0408 Gross per 24 hour  Intake 240 ml  Output 1900 ml  Net -1660  ml       -was heparin drip >>> switching to p.o. Xarelto per cardiology recommendation -Cardiology following closely -added low-dose ARB, holding beta-blocker due to low cardiac output, initiated digoxin -Stating prognosis poor -Monitor kidney function closely    LV (left ventricular) mural thrombus Due to Non-adhearance to eliquis: Per cardiology considering switching to Xarelto once a day   We dont think that this represents a failure of DOAC therapy at this point. Heparin gtt will discontinue 05/19/2022 Switching to Xarelto today 05/19/2022   Transaminitis Hepatitis/hepatic congestion due to severe CHF? Viral hepatitis panel all nonreactive  Infarct from embolus given mural thrombus? -had painless  Jaundice for past 1 year...? ETOH abuse  [place] in Jan unrevealing, but probably warrants repeat imaging at this point. -Questionable history of EtOH abuse, likely exacerbated congestive hepatopathy - severe CHF now  -Ending hepatitis panel -Monitor LFTs closely -Avoiding hepatotoxins     Latest Ref Rng & Units 05/20/2022    1:05 AM 05/19/2022    3:54 AM 05/18/2022    2:33 AM  Hepatic Function  Total Protein 6.5 - 8.1 g/dL 5.6  6.1  <3.0   Albumin 3.5 - 5.0 g/dL 2.5  2.7  <1.5   AST 15 - 41 U/L 92  104  31   ALT 0 - 44 U/L 136  137  35   Alk Phosphatase 38 - 126 U/L 179  202  55   Total Bilirubin 0.3 - 1.2 mg/dL 1.5  2.1  0.5   Bilirubin, Direct 0.0 - 0.2 mg/dL   0.2      Thrombocytopenia (HCC) Possibly due to platelets being consumed by the LV thrombus formation? Alternatively given 1 year h/o LFT elevations, maybe secondary to hypersplenism from portal venous hypertension? -Monitoring closely on heparin drip --- discontinue Initiating Xarelto, -Trend platelets closely  Paroxysmal atrial fibrillation (HCC) PAF discovered on loop recorder (placed following cryptogenic stroke). Heparin gtt >> switching to Xarelto Per cardiology digoxin was added Heart rate stabilized Mild thrombocytopenia-may be at increased risk of bleeding  Cardiomyopathy, ischemic -Currently stable, continue management as above, IV Lasix, -Holding, beta-blockers due to low EF, -Cardiology added digoxin, losartan small dose and spironolactone   Essential hypertension Sounds like non-adherent to home BP meds previously per Dr. [name] 11/30 note. BP has stabilized But holding beta-blockers due to low EF Current medications small dose of losartan, spironolactone, Lasix       ----------------------------------------------------------------------------------------------------------------------------------------------- Nutritional status:  The patient's BMI is: Body mass index is 20.91 kg/m. I agree  with the assessment and plan as outlined ------------------------------------------------------------------------------------------------------------------------------------  DVT prophylaxis:  Place TED hose Start: 05/18/22 1646Heparin drip rivaroxaban (XARELTO) tablet 20 mg   Code Status:   Code Status: Full Code  Family Communication: 2 family members present at bedside, updated  Goals of care: The above findings and plan of care has been discussed with patient (and family)  in detail,  they expressed understanding and agreement of above. -Advance care planning has been discussed.  -Palliative care consulted-following closely Prognosis remain poor  Admission status:   Status is: Inpatient Remains inpatient appropriate because: Needing IV heparin, further evaluation for apical thrombus     Procedures:   No admission procedures for hospital encounter.   Antimicrobials:  Anti-infectives (From admission, onward)    None        Medication:   digoxin  0.125 mg Oral Daily   furosemide  40 mg Intravenous BID   losartan  25 mg Oral Daily   rivaroxaban  20 mg Oral Q breakfast   sodium chloride flush  3 mL Intravenous Q12H   spironolactone  12.5 mg Oral Daily    sodium chloride, acetaminophen, ondansetron (ZOFRAN) IV, sodium chloride flush   Objective:   Vitals:   05/19/22 2352 05/20/22 0254 05/20/22 0409 05/20/22 0900  BP: 97/72  107/75 139/74  Pulse: 91 98 87 84  Resp: 16  16   Temp: 97.6 F (36.4 C)  97.7 F (36.5 C) 98.5 F (36.9 C)  TempSrc: Oral  Oral Oral  SpO2: 100%  100%   Weight:   68 kg   Height:        Intake/Output Summary (Last 24 hours) at 05/20/2022 1040 Last data filed at 05/20/2022 0408 Gross per 24 hour  Intake 240 ml  Output 1900 ml  Net -1660 ml   Filed Weights   05/18/22 0141 05/19/22 0446 05/20/22 0409  Weight: 68 kg 68.1 kg 68 kg     Examination:      Physical Exam:   General:  AAO x 3,  cooperative, no distress;    HEENT:  Normocephalic, PERRL, otherwise with in Normal limits   Neuro:  CNII-XII intact. , normal motor and sensation, reflexes intact   Lungs:   Clear to auscultation BL, Respirations unlabored,  No wheezes / crackles  Cardio:    S1/S2, RRR, No murmure, No Rubs or Gallops   Abdomen:  Soft, non-tender, bowel sounds active all four quadrants, no guarding or peritoneal signs.  Muscular  skeletal:  Limited exam -global generalized weaknesses - in bed, able to move all 4 extremities,   2+ pulses,  symmetric, No pitting edema  Skin:  Dry, warm to touch, negative for any Rashes,  Wounds: Please see nursing documentation  Pressure Injury 05/18/22 Buttocks Left Stage 2 -  Partial thickness loss of dermis presenting as a shallow open injury with a red, pink wound bed without slough. (Active)  05/18/22 1603  Location: Buttocks  Location Orientation: Left  Staging: Stage 2 -  Partial thickness loss of dermis presenting as a shallow open injury with a red, pink wound bed without slough.  Wound Description (Comments):   Present on Admission: Yes  Dressing Type Foam - Lift dressing to assess site every shift 05/19/22 2015           ------------------------------------------------------------------------------------------------------------------------------------------    LABs:     Latest Ref Rng & Units 05/20/2022    1:05 AM 05/18/2022    5:30 AM 05/17/2022    2:08 PM  CBC  WBC 4.0 - 10.5 K/uL 5.1  6.2  5.3   Hemoglobin 13.0 - 17.0 g/dL 13.0  14.8  15.2   Hematocrit 39.0 - 52.0 % 38.8  43.5  46.8   Platelets 150 - 400 K/uL 95  88  83       Latest Ref Rng & Units 05/20/2022    1:05 AM 05/19/2022    7:02 AM 05/19/2022    3:54 AM  CMP  Glucose 70 - 99 mg/dL 97  84  91   BUN 8 - 23 mg/dL _0 Creatinine 0.61 - 1.24 mg/dL 1.19  1.28  1.26   Sodium 135 - 145 mmol/L 133  137  135   Potassium 3.5 - 5.1 mmol/L 3.8  3.3  3.1   Chloride 98 - 111 mmol/L 91  93  94   CO2 22 - 32  mmol/L 34  28  31   Calcium 8.9 -  10.3 mg/dL 8.2  8.8  8.8   Total Protein 6.5 - 8.1 g/dL 5.6   6.1   Total Bilirubin 0.3 - 1.2 mg/dL 1.5   2.1   Alkaline Phos 38 - 126 U/L 179   202   AST 15 - 41 U/L 92   104   ALT 0 - 44 U/L 136   137     Micro Results No results found for this or any previous visit (from the past 240 hour(s)).  Radiology Reports No results found.  SIGNED: Deatra James, MD, FHM. [facility],  Pager (please use [email or web address] to page/text) Please use Epic Secure Chat for non-urgent communication (7AM-7PM)  If 7PM-7AM, please contact night-coverage [email or web address], 05/20/2022, 10:40 AM

## 2022-05-20 NOTE — Progress Notes (Signed)
Progress Note  Patient Name: Gregory Perry Date of Encounter: 05/20/2022  Primary Cardiologist: Cristopher Peru, MD   Subjective   Patient seen and examined at his bedside.  Couple family members at the bedside.  He had a lot of questions.  I was able to answer those.  Inpatient Medications    Scheduled Meds:  digoxin  0.125 mg Oral Daily   furosemide  40 mg Intravenous BID   losartan  25 mg Oral Daily   rivaroxaban  20 mg Oral Q breakfast   sodium chloride flush  3 mL Intravenous Q12H   spironolactone  12.5 mg Oral Daily   Continuous Infusions:  sodium chloride     PRN Meds: sodium chloride, acetaminophen, ondansetron (ZOFRAN) IV, sodium chloride flush   Vital Signs    Vitals:   05/19/22 2058 05/19/22 2352 05/20/22 0254 05/20/22 0409  BP: 102/78 97/72  107/75  Pulse: 71 91 98 87  Resp: '18 16  16  '$ Temp: 97.7 F (36.5 C) 97.6 F (36.4 C)  97.7 F (36.5 C)  TempSrc: Oral Oral  Oral  SpO2: 100% 100%  100%  Weight:    68 kg  Height:        Intake/Output Summary (Last 24 hours) at 05/20/2022 0823 Last data filed at 05/20/2022 0408 Gross per 24 hour  Intake 1044.98 ml  Output 1900 ml  Net -855.02 ml   Filed Weights   05/18/22 0141 05/19/22 0446 05/20/22 0409  Weight: 68 kg 68.1 kg 68 kg    Telemetry    Atrial fibrillation with PVCs- Personally Reviewed  ECG    None today - Personally Reviewed  Physical Exam     General: NAD Head: Atraumatic, normal size  Eyes: PEERLA, EOMI  Neck: Supple, normal JVD Cardiac: Normal S1, S2; RRR; 3/6 holosystolic murmurs, rubs, or gallops Lungs: Clear to auscultation bilaterally Abd: Soft, nontender, no hepatomegaly  Ext: warm, no edema Musculoskeletal: No deformities, BUE and BLE strength normal and equal Skin: Warm and dry, no rashes   Neuro: Alert and oriented to person, place, time, and situation, CNII-XII grossly intact, no focal deficits  Psych: Normal mood and affect   Labs    Chemistry Recent Labs   Lab 05/18/22 0233 05/19/22 0354 05/19/22 0702 05/20/22 0105  NA  --  135 137 133*  K  --  3.1* 3.3* 3.8  CL  --  94* 93* 91*  CO2  --  31 28 34*  GLUCOSE  --  91 84 97  BUN  --  24* 23 22  CREATININE  --  1.26* 1.28* 1.19  CALCIUM  --  8.8* 8.8* 8.2*  PROT <3.0* 6.1*  --  5.6*  ALBUMIN <1.5* 2.7*  --  2.5*  AST 31 104*  --  92*  ALT 35 137*  --  136*  ALKPHOS 55 202*  --  179*  BILITOT 0.5 2.1*  --  1.5*  GFRNONAA  --  >60 >60 >60  ANIONGAP  --  10 16* 8     Hematology Recent Labs  Lab 05/17/22 1408 05/18/22 0530 05/20/22 0105  WBC 5.3 6.2 5.1  RBC 4.83 4.62 4.11*  HGB 15.2 14.8 13.0  HCT 46.8 43.5 38.8*  MCV 96.9 94.2 94.4  MCH 31.5 32.0 31.6  MCHC 32.5 34.0 33.5  RDW 14.8 14.9 14.4  PLT 83* 88* 95*    Cardiac EnzymesNo results for input(s): "TROPONINI" in the last 168 hours. No results for input(s): "TROPIPOC" in  the last 168 hours.   BNP Recent Labs  Lab 05/17/22 1408 05/18/22 0810 05/20/22 0105  BNP 3,932.0* 2,203.3* 3,749.5*     DDimer No results for input(s): "DDIMER" in the last 168 hours.   Radiology    No results found.  Cardiac Studies   Transthoracic echocardiogram 05/17/2022 IMPRESSIONS     1. Severe global reduction in LV function with large apical thrombus;  Compared to 09/27/19 LV function worse and apical thrombus new; pt sent to  ER by Dr Curt Bears.   2. Left ventricular ejection fraction, by estimation, is <20%. The left  ventricle has severely decreased function. The left ventricle demonstrates  global hypokinesis. The left ventricular internal cavity size was severely  dilated. Left ventricular  diastolic parameters are consistent with Grade III diastolic dysfunction  (restrictive).   3. Right ventricular systolic function is severely reduced. The right  ventricular size is moderately enlarged.   4. Left atrial size was severely dilated.   5. Right atrial size was severely dilated.   6. A small pericardial effusion is  present.   7. The mitral valve is normal in structure. Mild mitral valve  regurgitation. No evidence of mitral stenosis.   8. The aortic valve is tricuspid. Aortic valve regurgitation is mild.  Aortic valve sclerosis is present, with no evidence of aortic valve  stenosis.   9. The inferior vena cava is dilated in size with <50% respiratory  variability, suggesting right atrial pressure of 15 mmHg.   FINDINGS   Left Ventricle: Left ventricular ejection fraction, by estimation, is  <20%. The left ventricle has severely decreased function. The left  ventricle demonstrates global hypokinesis. Definity contrast agent was  given IV to delineate the left ventricular  endocardial borders. The left ventricular internal cavity size was  severely dilated. There is no left ventricular hypertrophy. Left  ventricular diastolic parameters are consistent with Grade III diastolic  dysfunction (restrictive).   Right Ventricle: The right ventricular size is moderately enlarged. Right  ventricular systolic function is severely reduced.   Left Atrium: Left atrial size was severely dilated.   Right Atrium: Right atrial size was severely dilated.   Pericardium: A small pericardial effusion is present.   Mitral Valve: The mitral valve is normal in structure. Mild mitral valve  regurgitation. No evidence of mitral valve stenosis.   Tricuspid Valve: The tricuspid valve is normal in structure. Tricuspid  valve regurgitation is mild . No evidence of tricuspid stenosis.   Aortic Valve: The aortic valve is tricuspid. Aortic valve regurgitation is  mild. Aortic valve sclerosis is present, with no evidence of aortic valve  stenosis.   Pulmonic Valve: The pulmonic valve was normal in structure. Pulmonic valve  regurgitation is trivial. No evidence of pulmonic stenosis.   Aorta: The aortic root is normal in size and structure.   Venous: The inferior vena cava is dilated in size with less than 50%   respiratory variability, suggesting right atrial pressure of 15 mmHg.   IAS/Shunts: No atrial level shunt detected by color flow Doppler.    Patient Profile     71 y.o. male history of CVA, PAF, chronic systolic HF due to NICM, ETOH/tobacco use and non-compliance.  Assessment & Plan    Acute on chronic systolic Heart failure  LV Thrombus Elevate LFTs  Hx of PAF with prior CVA  Hx of CVA ( x2) Thrombocytopenia  Polysubstance abuse   Suspect his cardiomyopathy is nonischemic previous heart catheterization with nonobstructive coronary artery disease.  Currently he is in acute exacerbation of heart failure with reduced ejection fraction he has been on medical therapy as well as diuretics.  Unfortunately despite optimizing his medical therapy patient still does have poor prognosis.  And is not a great candidate for this as heart and alcohol use.  Therapy.  Previous ongoing discussion for palliative care, I agree with this plan of care.  In terms of his anticoagulation for his LV thrombus is also an ongoing discussion the patient has not been adherent with his anticoagulation therapy (unclear why) he was on Eliquis in the past.  He has not been started on Xarelto.  Coumadin would not be a great option for this patient.  Suspected liver congestion with elevated LFTs on multifactorial in the setting of his congestive heart failure and alcohol use.  Previous CVA due to atrial fibrillation-currently on Xarelto however concern for his history of nonadherence. He had multiple family members at the bedside asking a lot of questions which I was able to answer.    For questions or updates, please contact [place] Please consult [email or web address] for contact info under Cardiology/STEMI.      Signed, Berniece Salines, DO  05/20/2022, 8:23 AM

## 2022-05-21 ENCOUNTER — Other Ambulatory Visit (HOSPITAL_COMMUNITY): Payer: Self-pay

## 2022-05-21 DIAGNOSIS — I5023 Acute on chronic systolic (congestive) heart failure: Secondary | ICD-10-CM | POA: Diagnosis not present

## 2022-05-21 LAB — COMPREHENSIVE METABOLIC PANEL
ALT: 128 U/L — ABNORMAL HIGH (ref 0–44)
AST: 79 U/L — ABNORMAL HIGH (ref 15–41)
Albumin: 2.4 g/dL — ABNORMAL LOW (ref 3.5–5.0)
Alkaline Phosphatase: 170 U/L — ABNORMAL HIGH (ref 38–126)
Anion gap: 10 (ref 5–15)
BUN: 23 mg/dL (ref 8–23)
CO2: 37 mmol/L — ABNORMAL HIGH (ref 22–32)
Calcium: 8.3 mg/dL — ABNORMAL LOW (ref 8.9–10.3)
Chloride: 89 mmol/L — ABNORMAL LOW (ref 98–111)
Creatinine, Ser: 1.08 mg/dL (ref 0.61–1.24)
GFR, Estimated: >60 mL/min (ref >60)
Glucose, Bld: 87 mg/dL (ref 70–99)
Potassium: 3.3 mmol/L — ABNORMAL LOW (ref 3.5–5.1)
Sodium: 136 mmol/L (ref 135–145)
Total Bilirubin: 1.5 mg/dL — ABNORMAL HIGH (ref 0.3–1.2)
Total Protein: 5.9 g/dL — ABNORMAL LOW (ref 6.5–8.1)

## 2022-05-21 LAB — CBC
HCT: 41.7 % (ref 39.0–52.0)
Hemoglobin: 13.8 g/dL (ref 13.0–17.0)
MCH: 31.2 pg (ref 26.0–34.0)
MCHC: 33.1 g/dL (ref 30.0–36.0)
MCV: 94.3 fL (ref 80.0–100.0)
Platelets: 99 10*3/uL — ABNORMAL LOW (ref 150–400)
RBC: 4.42 MIL/uL (ref 4.22–5.81)
RDW: 14.1 % (ref 11.5–15.5)
WBC: 5.8 10*3/uL (ref 4.0–10.5)
nRBC: 0 % (ref 0.0–0.2)

## 2022-05-21 LAB — BRAIN NATRIURETIC PEPTIDE: B Natriuretic Peptide: 2363 pg/mL — ABNORMAL HIGH (ref 0.0–100.0)

## 2022-05-21 MED ORDER — SPIRONOLACTONE 25 MG PO TABS
25.0000 mg | ORAL_TABLET | Freq: Every day | ORAL | Status: DC
  Administered 2022-05-22: 25 mg via ORAL
  Filled 2022-05-21 (×2): qty 1

## 2022-05-21 MED ORDER — POTASSIUM CHLORIDE CRYS ER 20 MEQ PO TBCR
40.0000 meq | EXTENDED_RELEASE_TABLET | Freq: Once | ORAL | Status: AC
  Administered 2022-05-21: 40 meq via ORAL
  Filled 2022-05-21: qty 2

## 2022-05-21 MED ORDER — ACETAZOLAMIDE 250 MG PO TABS
500.0000 mg | ORAL_TABLET | Freq: Once | ORAL | Status: AC
  Administered 2022-05-21: 500 mg via ORAL
  Filled 2022-05-21: qty 2

## 2022-05-21 NOTE — Care Management Important Message (Signed)
Important Message  Patient Details  Name: KINNICK MAUS MRN: [id number] Date of Birth: 1950-12-05   Medicare Important Message Given:  Yes     Orbie Pyo 05/21/2022, 3:26 PM

## 2022-05-21 NOTE — Progress Notes (Signed)
Cardiology Progress Note  Patient ID: NATHANAL HERMIZ MRN: [id number] DOB: 1950/09/09 Date of Encounter: 05/21/2022  Primary Cardiologist: Cristopher Peru, MD  Subjective   Chief Complaint: Shortness of breath  HPI: Good urine output.  Remains volume overloaded.  Blood pressure improved.  Telemetry with multifocal atrial tachycardia.  ROS:  All other ROS reviewed and negative. Pertinent positives noted in the HPI.     Inpatient Medications  Scheduled Meds:  acetaZOLAMIDE  500 mg Oral Once   digoxin  0.125 mg Oral Daily   furosemide  40 mg Intravenous BID   losartan  25 mg Oral Daily   rivaroxaban  20 mg Oral Q breakfast   sodium chloride flush  3 mL Intravenous Q12H   [START ON 05/22/2022] spironolactone  25 mg Oral Daily   Continuous Infusions:  sodium chloride     PRN Meds: sodium chloride, acetaminophen, ondansetron (ZOFRAN) IV, sodium chloride flush   Vital Signs   Vitals:   05/20/22 1937 05/20/22 2326 05/21/22 0336 05/21/22 0814  BP: 122/76 116/78 132/81 (!) 113/100  Pulse: 91 90 90 98  Resp: '20 18 19 17  '$ Temp: 97.9 F (36.6 C) 98 F (36.7 C) 97.8 F (36.6 C) 97.7 F (36.5 C)  TempSrc: Oral Axillary Axillary Oral  SpO2: 94% 96% 95% 95%  Weight:   68.1 kg   Height:        Intake/Output Summary (Last 24 hours) at 05/21/2022 0949 Last data filed at 05/21/2022 8338 Gross per 24 hour  Intake 1080 ml  Output 5120 ml  Net -4040 ml      05/21/2022    3:36 AM 05/20/2022    4:09 AM 05/19/2022    4:46 AM  Last 3 Weights  Weight (lbs) 150 lb 2.1 oz 149 lb 14.6 oz 150 lb 2.1 oz  Weight (kg) 68.1 kg 68 kg 68.1 kg      Telemetry  Overnight telemetry shows multifocal atrial tachycardia heart rate in the 120s, which I personally reviewed.   ECG  The most recent ECG shows wandering atrial pacemaker, 87 bpm, which I personally reviewed.   Physical Exam   Vitals:   05/20/22 1937 05/20/22 2326 05/21/22 0336 05/21/22 0814  BP: 122/76 116/78 132/81 (!)  113/100  Pulse: 91 90 90 98  Resp: '20 18 19 17  '$ Temp: 97.9 F (36.6 C) 98 F (36.7 C) 97.8 F (36.6 C) 97.7 F (36.5 C)  TempSrc: Oral Axillary Axillary Oral  SpO2: 94% 96% 95% 95%  Weight:   68.1 kg   Height:        Intake/Output Summary (Last 24 hours) at 05/21/2022 0949 Last data filed at 05/21/2022 2505 Gross per 24 hour  Intake 1080 ml  Output 5120 ml  Net -4040 ml       05/21/2022    3:36 AM 05/20/2022    4:09 AM 05/19/2022    4:46 AM  Last 3 Weights  Weight (lbs) 150 lb 2.1 oz 149 lb 14.6 oz 150 lb 2.1 oz  Weight (kg) 68.1 kg 68 kg 68.1 kg    Body mass index is 20.94 kg/m.  General: Well nourished, well developed, in no acute distress Head: Atraumatic, normal size  Eyes: PEERLA, EOMI  Neck: Supple, no JVD Endocrine: No thryomegaly Cardiac: Normal S1, S2; irregular rhythm; 2 out of 6 holosystolic murmur Lungs: Diminished breath sounds bilaterally Abd: Soft, nontender, no hepatomegaly  Ext: Trace edema Musculoskeletal: No deformities, BUE and BLE strength normal and equal Skin: Warm and  dry, no rashes   Neuro: Alert and oriented to person, place, time, and situation, CNII-XII grossly intact, no focal deficits  Psych: Normal mood and affect   Labs  High Sensitivity Troponin:   Recent Labs  Lab 05/18/22 0138 05/18/22 0424 05/19/22 0702  TROPONINIHS 9 24* 28*     Cardiac EnzymesNo results for input(s): "TROPONINI" in the last 168 hours. No results for input(s): "TROPIPOC" in the last 168 hours.  Chemistry Recent Labs  Lab 05/19/22 0354 05/19/22 0702 05/20/22 0105 05/21/22 0134  NA 135 137 133* 136  K 3.1* 3.3* 3.8 3.3*  CL 94* 93* 91* 89*  CO2 31 28 34* 37*  GLUCOSE 91 84 97 87  BUN 24* '23 22 23  '$ CREATININE 1.26* 1.28* 1.19 1.08  CALCIUM 8.8* 8.8* 8.2* 8.3*  PROT 6.1*  --  5.6* 5.9*  ALBUMIN 2.7*  --  2.5* 2.4*  AST 104*  --  92* 79*  ALT 137*  --  136* 128*  ALKPHOS 202*  --  179* 170*  BILITOT 2.1*  --  1.5* 1.5*  GFRNONAA >60 >60 >60 >60   ANIONGAP 10 16* 8 10    Hematology Recent Labs  Lab 05/18/22 0530 05/20/22 0105 05/21/22 0134  WBC 6.2 5.1 5.8  RBC 4.62 4.11* 4.42  HGB 14.8 13.0 13.8  HCT 43.5 38.8* 41.7  MCV 94.2 94.4 94.3  MCH 32.0 31.6 31.2  MCHC 34.0 33.5 33.1  RDW 14.9 14.4 14.1  PLT 88* 95* 99*   BNP Recent Labs  Lab 05/18/22 0810 05/20/22 0105 05/21/22 0134  BNP 2,203.3* 3,749.5* 2,363.0*    DDimer No results for input(s): "DDIMER" in the last 168 hours.   Radiology  No results found.  Cardiac Studies  TTE 05/17/2022  1. Severe global reduction in LV function with large apical thrombus;  Compared to 09/27/19 LV function worse and apical thrombus new; pt sent to  ER by Dr Curt Bears.   2. Left ventricular ejection fraction, by estimation, is <20%. The left  ventricle has severely decreased function. The left ventricle demonstrates  global hypokinesis. The left ventricular internal cavity size was severely  dilated. Left ventricular  diastolic parameters are consistent with Grade III diastolic dysfunction  (restrictive).   3. Right ventricular systolic function is severely reduced. The right  ventricular size is moderately enlarged.   4. Left atrial size was severely dilated.   5. Right atrial size was severely dilated.   6. A small pericardial effusion is present.   7. The mitral valve is normal in structure. Mild mitral valve  regurgitation. No evidence of mitral stenosis.   8. The aortic valve is tricuspid. Aortic valve regurgitation is mild.  Aortic valve sclerosis is present, with no evidence of aortic valve  stenosis.   9. The inferior vena cava is dilated in size with <50% respiratory  variability, suggesting right atrial pressure of 15 mmHg.   Patient Profile  AHMED INNISS is a 71 y.o. male with systolic heart failure, alcohol and tobacco abuse, stroke, paroxysmal atrial fibrillation, medication noncompliance admitted on 05/17/2022 for acute on chronic systolic heart failure  and new left ventricular thrombus.  Assessment & Plan   # Acute on chronic systolic heart failure, EF less than 20% # Medication noncompliance # Alcohol abuse -Remains volume overloaded.  Evaluated by advanced heart failure.  Not a candidate for advanced therapies.  All we can offer him is medical therapy. -We will continue with Lasix 40 mg IV twice daily. -  Increase Aldactone to 25 mg daily. -On digoxin 0.25 mg daily.  No beta-blocker. -On losartan 25 mg daily.  I do not believe he will tolerate Entresto. -His telemetry shows multifocal atrial tachycardia.  We will check a digoxin level tomorrow. -One-time dose of acetazolamide given rising serum bicarbonate value. -Liver enzymes are improving.  BNP is trending down. -Ongoing palliative care discussions.  # LV thrombus -On Xarelto.  Compliance is questionable.  # Elevated liver enzymes -Multifactorial in the setting of hepatic congestion as well as alcohol abuse.  # Paroxysmal atrial fibrillation -Maintaining sinus rhythm.  On Xarelto.  # Multifocal atrial tachycardia -We will check a digoxin level tomorrow.  Not a candidate for beta-blocker.  Does not appear to represent A-fib at this time.    For questions or updates, please contact [place] Please consult [email or web address] for contact info under        Signed, [place] T. Audie Box, MD, [place]  05/21/2022 9:49 AM

## 2022-05-21 NOTE — Discharge Instructions (Signed)

## 2022-05-21 NOTE — TOC Benefit Eligibility Note (Signed)
Patient Teacher, English as a foreign language completed.    The patient is currently admitted and upon discharge could be taking Xarelto 20 mg.  The current 30 day co-pay is $10.35.   The patient is insured through [place] Patient Advocate Specialist [place] Patient Advocate Team Direct Number: [phone]  Fax: [phone]

## 2022-05-21 NOTE — Progress Notes (Signed)
Daily Progress Note   Patient Name: Gregory Perry       Date: 05/21/2022 DOB: [birth year]  Age: 71 y.o. MRN#: [id number] Attending Physician: Deatra James, MD Primary Care Physician: Teena Dunk, NP Admit Date: 05/17/2022  Reason for Consultation/Follow-up: Establishing goals of care  Subjective: Medical records reviewed including progress notes, labs. Patient assessed at the bedside.  He reports feeling sad about his current situation and trying to "go with the flow."  No family present during visit.  Created space and opportunity for patient's thoughts and feelings after our conversation yesterday with his family.  Explored his thoughts on whether he would like hospice support as well as whether he would like to update his code status.  He tells me that he is now open to a referral to hospice, stating it is "better to be safe than sorry."  In terms of CODE STATUS, he is still grappling with this and does not want to make a rushed decision.  He understands the gravity and weight of this important decision.  Advised patient that he can continue to discuss with hospice and in time, arrive at choice that he is at peace with.  He verbalizes his understanding and appreciation.  Discussed with NT to assist with lunch, as he is worried about spilling food on himself.  Emotional support and therapeutic listening was provided.  We discussed his thoughts around advance care planning documentation and he is still open to completing this tomorrow if able prior to discharge.  Discussed with RN.  She states family has concerns about the heating in their home and readiness for patient's return.  Questions and concerns addressed. PMT will continue to support holistically.   Length of Stay:  3  Physical Exam Vitals and nursing note reviewed.  Constitutional:      General: He is not in acute distress.    Appearance: He is ill-appearing.     Interventions: Nasal cannula in place.  Cardiovascular:     Rate and Rhythm: Tachycardia present.  Pulmonary:     Effort: Pulmonary effort is normal.  Skin:    General: Skin is warm and dry.  Neurological:     Mental Status: He is alert and oriented to person, place, and time.  Psychiatric:        Mood and  Affect: Mood normal.        Behavior: Behavior normal.        Thought Content: Thought content normal.        Cognition and Memory: He exhibits impaired recent memory.      Vital Signs: BP (!) 113/100 (BP Location: Left Arm)   Pulse 98   Temp 97.7 F (36.5 C) (Oral)   Resp 17   Ht '5\' 11"'$  (1.803 m)   Wt 68.1 kg   SpO2 95%   BMI 20.94 kg/m  SpO2: SpO2: 95 % O2 Device: O2 Device: Room Air O2 Flow Rate: O2 Flow Rate (L/min): 1 L/min      Palliative Assessment/Data: 50%   Palliative Care Assessment & Plan   Patient Profile: 71 y.o. male  with past medical history of  hypertension, stroke, CAD, PAF on Eliquis admitted on 05/17/2022 with EF of less than 20% on 2D echo in outpatient clinic.    Patient has not been adherent to Eliquis. He is now admitted with acute on chronic HFrEF with LV thrombus and grade 3 diastolic dysfunction.  PMT has been consulted to assist with goals of care conversation.  Assessment: Goals of care conversatio End stage CHF, acute exacerbation PAF on Eliquis  LV Thrombus  Hx of CVA ( x2)   Recommendations/Plan: Continue full code Continue current care Patient is agreeable to a referral to hospice today.  TOC consulted and assistance is appreciated Psychosocial and emotional support provided Spiritual care previously consulted for advance directives, patient wants to name his nephew Freida Busman as Economist.  Hopefully this can be accomplished prior to discharge tomorrow, which is when PMT notary  returns PMT will continue to follow and support   Prognosis:  < 6 months  Discharge Planning: Home with Hospice  Care plan was discussed with patient, NT, RN, Dr. Roger Shelter   MDM high         Aneeka Bowden Johnnette Litter, PA-C  Palliative Medicine Team Team phone # [phone]  Thank you for allowing the Palliative Medicine Team to assist in the care of this patient. Please utilize secure chat with additional questions, if there is no response within 30 minutes please call the above phone number.  Palliative Medicine Team providers are available by phone from 7am to 7pm daily and can be reached through the team cell phone.  Should this patient require assistance outside of these hours, please call the patient's attending physician.

## 2022-05-21 NOTE — Progress Notes (Signed)
Physical Therapy Treatment Patient Details Name: Gregory Perry MRN: [id number] DOB: 05/24/1951 Today's Date: 05/21/2022   History of Present Illness Pt is a 71 y.o. male who presented 05/17/22 as referral from cardiology due to SOB and echocardiogram showing LV mural thrombus and worsening HF. PMH: HTN, CVA, CAD, PAF on eliquis, alcohol abuse, CHF, prostate cancer    PT Comments    Pt progressing towards his physical therapy goals and is agreeable to participate. Pt ambulating 40 ft x 2 with a walker, min-mod assist and close chair follow. HR 92-151 afib. Pt presents with significantly decreased cardiopulmonary endurance, impaired standing balance, gait abnormalities. Presents as a high fall risk based on decreased gait speed and safety awareness. Continue to recommend SNF for ongoing Physical Therapy.      Recommendations for follow up therapy are one component of a multi-disciplinary discharge planning process, led by the attending physician.  Recommendations may be updated based on patient status, additional functional criteria and insurance authorization.  Follow Up Recommendations  Skilled nursing-short term rehab (<3 hours/day) Can patient physically be transported by private vehicle: Yes   Assistance Recommended at Discharge Frequent or constant Supervision/Assistance  Patient can return home with the following A lot of help with walking and/or transfers;A little help with bathing/dressing/bathroom;Assistance with cooking/housework;Assist for transportation;Help with stairs or ramp for entrance   Equipment Recommendations  Rolling walker (2 wheels);Wheelchair (measurements PT);Wheelchair cushion (measurements PT)    Recommendations for Other Services       Precautions / Restrictions Precautions Precautions: Fall;Other (comment) Precaution Comments: watch HR Restrictions Weight Bearing Restrictions: No     Mobility  Bed Mobility Overal bed mobility: Needs  Assistance Bed Mobility: Supine to Sit     Supine to sit: Supervision          Transfers Overall transfer level: Needs assistance Equipment used: Rolling walker (2 wheels) Transfers: Sit to/from Stand Sit to Stand: Min guard           General transfer comment: min guard for safety    Ambulation/Gait Ambulation/Gait assistance: Min assist, Mod assist Gait Distance (Feet): 80 Feet (40', 40) Assistive device: Rolling walker (2 wheels) Gait Pattern/deviations: Step-through pattern, Decreased stride length, Wide base of support, Trunk flexed Gait velocity: reduced     General Gait Details: Pt with increased truncal sway/lean and drift, requiring min-modA for balance. Chair follow utilized for safety.   Stairs             Wheelchair Mobility    Modified Rankin (Stroke Patients Only)       Balance Overall balance assessment: Needs assistance Sitting-balance support: No upper extremity supported, Feet supported Sitting balance-Leahy Scale: Fair     Standing balance support: No upper extremity supported, Bilateral upper extremity supported, During functional activity Standing balance-Leahy Scale: Poor Standing balance comment: minA with no hand support                            Cognition Arousal/Alertness: Awake/alert Behavior During Therapy: WFL for tasks assessed/performed Overall Cognitive Status: Within Functional Limits for tasks assessed                                          Exercises      General Comments        Pertinent Vitals/Pain Pain Assessment Pain Assessment: No/denies pain  Home Living                          Prior Function            PT Goals (current goals can now be found in the care plan section) Acute Rehab PT Goals Potential to Achieve Goals: Fair Progress towards PT goals: Progressing toward goals    Frequency    Min 2X/week      PT Plan Frequency needs to be  updated    Co-evaluation              AM-PAC PT "6 Clicks" Mobility   Outcome Measure  Help needed turning from your back to your side while in a flat bed without using bedrails?: A Little Help needed moving from lying on your back to sitting on the side of a flat bed without using bedrails?: A Little Help needed moving to and from a bed to a chair (including a wheelchair)?: A Little Help needed standing up from a chair using your arms (e.g., wheelchair or bedside chair)?: A Little Help needed to walk in hospital room?: A Lot Help needed climbing 3-5 steps with a railing? : Total 6 Click Score: 15    End of Session Equipment Utilized During Treatment: Gait belt Activity Tolerance: Patient tolerated treatment well Patient left: with call bell/phone within reach;in chair;with chair alarm set Nurse Communication: Mobility status PT Visit Diagnosis: Unsteadiness on feet (R26.81);Other abnormalities of gait and mobility (R26.89);Muscle weakness (generalized) (M62.81);Difficulty in walking, not elsewhere classified (R26.2)     Time: 0093-8182 PT Time Calculation (min) (ACUTE ONLY): 19 min  Charges:  $Therapeutic Activity: 8-22 mins                     Wyona Almas, PT, DPT Acute Rehabilitation Services Office [phone]    Deno Etienne 05/21/2022, 5:00 PM

## 2022-05-21 NOTE — Consult Note (Signed)
[facility] Nurse Consult Note: Reason for Consult: sacrum/scrotal  wounds  Wound type: Stage 2 left ischium  Pressure Injury POA: Yes Measurement: less than  0.2 cms    Wound bed: pink Drainage (amount, consistency, odor) none Periwound: intact Dressing procedure/placement/frequency:  Moisture barrier cream with bathing and incontinence care   Patient malnourished, high risk for skin breakdown, added prophylactic use of foam dressings to trochanters.   Discussed POC with patient and bedside nurse.  Re consult if needed, will not follow at this time. Thanks  Issachar Broady [employer], RN,CWOCN, CNS, [place] [phone])

## 2022-05-21 NOTE — Progress Notes (Addendum)
Heart Failure Navigator Progress Note  Assessed for Heart & Vascular TOC clinic readiness.  Patient does not meet criteria due to referral for Hospice has been made. .  On 05/22/2022 Dr. Audie Box note patient will not be going to Hospice now.  Will plan to interview  Earnestine Leys, BSN, RN Heart Failure Nurse Navigator Secure Chat Only

## 2022-05-21 NOTE — Progress Notes (Signed)
PROGRESS NOTE    Patient: Gregory Perry                            PCP: Bo Merino I, NP                    DOB: 1950-09-19            DOA: 05/17/2022 JXB:[id number]             DOS: 05/21/2022, 11:56 AM   LOS: 3 days   Date of Service: The patient was seen and examined on 05/21/2022  Subjective:   The patient was seen and examined this morning, stable no acute distress No issues overnight    Brief Narrative:   Gregory Perry is a 71 y.o. male with medical history significant of HTN, stroke, CAD, PAF on eliquis.   Was taking eliquis, but not adherent to any other meds as of Dr. Doug Sou office note 11/30.   Pt sent in to ED today from cards office after 2d echo showed worsened EF < 20% and L apical mural thrombus.   He has been having shortness of breath for several months with symptoms waxing and waning but no recent worsening. He has chronic edema which is also worsening. He does have some intermittent chest discomfort.     CXR: IMPRESSION: 1. Prominent interstitial opacities bilaterally, which could represent pulmonary venous congestion or atypical infection. 2. Cardiomegaly.   BNP 3932   2D Echo: IMPRESSIONS    1. Severe global reduction in LV function with large apical thrombus;  Compared to 09/27/19 LV function worse and apical thrombus new; pt sent to  ER by Dr Curt Bears.   2. Left ventricular ejection fraction, by estimation, is <20%. The left  ventricle has severely decreased function. The left ventricle demonstrates  global hypokinesis. The left ventricular internal cavity size was severely  dilated. Left ventricular  diastolic parameters are consistent with Grade III diastolic dysfunction  (restrictive).   3. Right ventricular systolic function is severely reduced. The right  ventricular size is moderately enlarged.   4. Left atrial size was severely dilated.   5. Right atrial size was severely dilated.   6. A small pericardial effusion is present.    7. The mitral valve is normal in structure. Mild mitral valve  regurgitation. No evidence of mitral stenosis.   8. The aortic valve is tricuspid.     Assessment & Plan:   Principal Problem:   Acute on chronic HFrEF (heart failure with reduced ejection fraction) (HCC) Active Problems:   LV (left ventricular) mural thrombus   Transaminitis   Essential hypertension   Cardiomyopathy, ischemic   Paroxysmal atrial fibrillation (HCC)   Thrombocytopenia (HCC)     Assessment and Plan: * Acute on chronic HFrEF (heart failure with reduced ejection fraction) (HCC) -Hemodynamically stable, improved shortness of breath -Still complain of shortness of breath with minimal exertion  - Worsening EF due to ICM.  Now EF < 20% on Echo with LV thrombus.  Also grade 3 DD too. -Continue aggressive diuresis per CHF pathway -Currently on Lasix 40 mg IV daily -Monitoring I's and O's, daily weight, telemetry monitoring   Intake/Output Summary (Last 24 hours) at 05/21/2022 1150 Last data filed at 05/21/2022 1039 Gross per 24 hour  Intake 1200 ml  Output 5020 ml  Net -3820 ml   -Cardiology still believes patient is volume overload -Still recommending Lasix IV 40  mg twice a day -Increase his Aldactone to 25 mg daily, Recommend to continue digoxin and losartan Not a candidate for Entresto  Overnight some tachycardia with noted, checking digoxin level in a.m.  -Improving LFTs , BMP and creatinine        -was heparin drip >>> switching to p.o. Xarelto per cardiology  -Stating prognosis poor     LV (left ventricular) mural thrombus Due to Non-adhearance to eliquis: Per cardiology considering switching to Xarelto once a day   We dont think that this represents a failure of DOAC therapy at this point. Heparin gtt will discontinue 05/19/2022 Switching to Xarelto 05/19/2022   Transaminitis Hepatitis/hepatic congestion due to severe CHF? Viral hepatitis panel all  nonreactive  Infarct from embolus given mural thrombus? -had painless Jaundice for past 1 year...? ETOH abuse  [place] in Jan unrevealing, but probably warrants repeat imaging at this point. -Questionable history of EtOH abuse, likely exacerbated congestive hepatopathy - severe CHF now  l -Monitor LFTs closely -Avoiding hepatotoxins     Latest Ref Rng & Units 05/21/2022    1:34 AM 05/20/2022    1:05 AM 05/19/2022    3:54 AM  Hepatic Function  Total Protein 6.5 - 8.1 g/dL 5.9  5.6  6.1   Albumin 3.5 - 5.0 g/dL 2.4  2.5  2.7   AST 15 - 41 U/L 79  92  104   ALT 0 - 44 U/L 128  136  137   Alk Phosphatase 38 - 126 U/L 170  179  202   Total Bilirubin 0.3 - 1.2 mg/dL 1.5  1.5  2.1       Thrombocytopenia (HCC) -was on heparin drip --- discontinue - on Xarelto  now  -Trend platelets closely  Paroxysmal atrial fibrillation (HCC) PAF discovered on loop recorder (placed following cryptogenic stroke). Heparin gtt >> switching to Xarelto Per cardiology digoxin was added Heart rate stabilized Mild thrombocytopenia-may be at increased risk of bleeding  Cardiomyopathy, ischemic -Currently stable, continue management as above, IV Lasix, -NO beta-blockers due to low EF, -Cards started:  digoxin, losartan small dose and spironolactone   Essential hypertension Sounds like non-adherent to home BP meds previously per Dr. [name] 11/30 note. BP has stabilized No beta-blockers due to low EF Current medications small dose of losartan, spironolactone, Lasix       ----------------------------------------------------------------------------------------------------------------------------------------------- Nutritional status:  The patient's BMI is: Body mass index is 20.94 kg/m. I agree with the assessment and plan as outlined ------------------------------------------------------------------------------------------------------------------------------------  DVT prophylaxis:  Place TED  hose Start: 05/18/22 1646Heparin drip rivaroxaban (XARELTO) tablet 20 mg   Code Status:   Code Status: Full Code   Disposition: From home  Likely to be discharged Home 05/22/2022 if remains stable   family Communication: Family members present at bedside, updated  Goals of care: The above findings and plan of care has been discussed with patient (and family)  in detail,  they expressed understanding and agreement of above. -Advance care planning has been discussed.  -Palliative care consulted-following closely Prognosis remain poor  Admission status:   Status is: Inpatient Remains inpatient appropriate because: Needing IV heparin, further evaluation for apical thrombus     Procedures:   No admission procedures for hospital encounter.   Antimicrobials:  Anti-infectives (From admission, onward)    None        Medication:   digoxin  0.125 mg Oral Daily   furosemide  40 mg Intravenous BID   losartan  25 mg Oral Daily  rivaroxaban  20 mg Oral Q breakfast   sodium chloride flush  3 mL Intravenous Q12H   [START ON 05/22/2022] spironolactone  25 mg Oral Daily    sodium chloride, acetaminophen, ondansetron (ZOFRAN) IV, sodium chloride flush   Objective:   Vitals:   05/20/22 1937 05/20/22 2326 05/21/22 0336 05/21/22 0814  BP: 122/76 116/78 132/81 (!) 113/100  Pulse: 91 90 90 98  Resp: _0 Temp: 97.9 F (36.6 C) 98 F (36.7 C) 97.8 F (36.6 C) 97.7 F (36.5 C)  TempSrc: Oral Axillary Axillary Oral  SpO2: 94% 96% 95% 95%  Weight:   68.1 kg   Height:        Intake/Output Summary (Last 24 hours) at 05/21/2022 1156 Last data filed at 05/21/2022 1039 Gross per 24 hour  Intake 1200 ml  Output 5020 ml  Net -3820 ml   Filed Weights   05/19/22 0446 05/20/22 0409 05/21/22 0336  Weight: 68.1 kg 68 kg 68.1 kg     Examination:     General:  AAO x 3,  cooperative, no distress; mild SOB   HEENT:  Normocephalic, PERRL, otherwise with in Normal  limits   Neuro:  CNII-XII intact. , normal motor and sensation, reflexes intact   Lungs:   Clear to auscultation BL, Respirations unlabored,  No wheezes / crackles  Cardio:    S1/S2, RRR, No murmure, No Rubs or Gallops   Abdomen:  Soft, non-tender, bowel sounds active all four quadrants, no guarding or peritoneal signs.  Muscular  skeletal:  Limited exam -global generalized weaknesses - in bed, able to move all 4 extremities,   2+ pulses,  symmetric, +1 pitting edema  Skin:  Dry, warm to touch, negative for any Rashes,  Wounds: Please see nursing documentation  Pressure Injury 05/18/22 Buttocks Left Stage 2 -  Partial thickness loss of dermis presenting as a shallow open injury with a red, pink wound bed without slough. (Active)  05/18/22 1603  Location: Buttocks  Location Orientation: Left  Staging: Stage 2 -  Partial thickness loss of dermis presenting as a shallow open injury with a red, pink wound bed without slough.  Wound Description (Comments):   Present on Admission: Yes  Dressing Type Foam - Lift dressing to assess site every shift 05/21/22 0800               ------------------------------------------------------------------------------------------------------------------------------------------    LABs:     Latest Ref Rng & Units 05/21/2022    1:34 AM 05/20/2022    1:05 AM 05/18/2022    5:30 AM  CBC  WBC 4.0 - 10.5 K/uL 5.8  5.1  6.2   Hemoglobin 13.0 - 17.0 g/dL 13.8  13.0  14.8   Hematocrit 39.0 - 52.0 % 41.7  38.8  43.5   Platelets 150 - 400 K/uL 99  95  88       Latest Ref Rng & Units 05/21/2022    1:34 AM 05/20/2022    1:05 AM 05/19/2022    7:02 AM  CMP  Glucose 70 - 99 mg/dL 87  97  84   BUN 8 - 23 mg/dL _1 Creatinine 0.61 - 1.24 mg/dL 1.08  1.19  1.28   Sodium 135 - 145 mmol/L 136  133  137   Potassium 3.5 - 5.1 mmol/L 3.3  3.8  3.3   Chloride 98 - 111 mmol/L 89  91  93   CO2 22 - 32 mmol/L  37  34  28   Calcium 8.9 - 10.3 mg/dL 8.3   8.2  8.8   Total Protein 6.5 - 8.1 g/dL 5.9  5.6    Total Bilirubin 0.3 - 1.2 mg/dL 1.5  1.5    Alkaline Phos 38 - 126 U/L 170  179    AST 15 - 41 U/L 79  92    ALT 0 - 44 U/L 128  136      Micro Results No results found for this or any previous visit (from the past 240 hour(s)).  Radiology Reports No results found.  SIGNED: Deatra James, MD, FHM. [facility],  Pager (please use [email or web address] to page/text) Please use Epic Secure Chat for non-urgent communication (7AM-7PM)  If 7PM-7AM, please contact night-coverage [email or web address], 05/21/2022, 11:56 AM

## 2022-05-22 ENCOUNTER — Other Ambulatory Visit (HOSPITAL_COMMUNITY): Payer: Self-pay

## 2022-05-22 DIAGNOSIS — L8990 Pressure ulcer of unspecified site, unspecified stage: Secondary | ICD-10-CM | POA: Insufficient documentation

## 2022-05-22 LAB — COMPREHENSIVE METABOLIC PANEL
ALT: 114 U/L — ABNORMAL HIGH (ref 0–44)
AST: 59 U/L — ABNORMAL HIGH (ref 15–41)
Albumin: 2.5 g/dL — ABNORMAL LOW (ref 3.5–5.0)
Alkaline Phosphatase: 179 U/L — ABNORMAL HIGH (ref 38–126)
Anion gap: 8 (ref 5–15)
BUN: 19 mg/dL (ref 8–23)
CO2: 35 mmol/L — ABNORMAL HIGH (ref 22–32)
Calcium: 8.5 mg/dL — ABNORMAL LOW (ref 8.9–10.3)
Chloride: 93 mmol/L — ABNORMAL LOW (ref 98–111)
Creatinine, Ser: 1.09 mg/dL (ref 0.61–1.24)
GFR, Estimated: >60 mL/min (ref >60)
Glucose, Bld: 85 mg/dL (ref 70–99)
Potassium: 3.4 mmol/L — ABNORMAL LOW (ref 3.5–5.1)
Sodium: 136 mmol/L (ref 135–145)
Total Bilirubin: 1.3 mg/dL — ABNORMAL HIGH (ref 0.3–1.2)
Total Protein: 6.2 g/dL — ABNORMAL LOW (ref 6.5–8.1)

## 2022-05-22 LAB — CBC
HCT: 46.6 % (ref 39.0–52.0)
Hemoglobin: 15.7 g/dL (ref 13.0–17.0)
MCH: 31 pg (ref 26.0–34.0)
MCHC: 33.7 g/dL (ref 30.0–36.0)
MCV: 91.9 fL (ref 80.0–100.0)
Platelets: 142 10*3/uL — ABNORMAL LOW (ref 150–400)
RBC: 5.07 MIL/uL (ref 4.22–5.81)
RDW: 14.2 % (ref 11.5–15.5)
WBC: 5.8 10*3/uL (ref 4.0–10.5)
nRBC: 0 % (ref 0.0–0.2)

## 2022-05-22 LAB — BRAIN NATRIURETIC PEPTIDE: B Natriuretic Peptide: 1459.9 pg/mL — ABNORMAL HIGH (ref 0.0–100.0)

## 2022-05-22 LAB — DIGOXIN LEVEL: Digoxin Level: 0.2 ng/mL — ABNORMAL LOW (ref 0.8–2.0)

## 2022-05-22 MED ORDER — AMIODARONE HCL 200 MG PO TABS
200.0000 mg | ORAL_TABLET | Freq: Two times a day (BID) | ORAL | Status: DC
  Administered 2022-05-22 – 2022-05-23 (×2): 200 mg via ORAL
  Filled 2022-05-22 (×2): qty 1

## 2022-05-22 MED ORDER — AMIODARONE HCL 200 MG PO TABS: 200.0000 mg | ORAL_TABLET | Freq: Every day | ORAL | Status: DC

## 2022-05-22 MED ORDER — EMPAGLIFLOZIN 10 MG PO TABS
10.0000 mg | ORAL_TABLET | Freq: Every day | ORAL | Status: DC
  Administered 2022-05-22 – 2022-05-23 (×2): 10 mg via ORAL
  Filled 2022-05-22 (×2): qty 1

## 2022-05-22 MED ORDER — CARVEDILOL 3.125 MG PO TABS
3.1250 mg | ORAL_TABLET | Freq: Two times a day (BID) | ORAL | Status: DC
  Administered 2022-05-22: 3.125 mg via ORAL
  Filled 2022-05-22 (×2): qty 1

## 2022-05-22 MED ORDER — POTASSIUM CHLORIDE CRYS ER 20 MEQ PO TBCR
40.0000 meq | EXTENDED_RELEASE_TABLET | Freq: Once | ORAL | Status: AC
  Administered 2022-05-22: 40 meq via ORAL
  Filled 2022-05-22: qty 2

## 2022-05-22 NOTE — TOC Benefit Eligibility Note (Signed)
Patient Gregory Perry, English as a foreign language completed.    The patient is currently admitted and upon discharge could be taking Jardiance 10 mg.  The current 30 day co-pay is $10.35.   The patient is insured through [employer], [place] Patient Advocate Specialist [place] Patient Advocate Team Direct Number: [phone]  Fax: [phone]

## 2022-05-22 NOTE — Progress Notes (Signed)
Cardiology Progress Note  Patient ID: Gregory Perry MRN: [id number] DOB: 06-10-1950 Date of Encounter: 05/22/2022  Primary Cardiologist: Cristopher Peru, MD  Subjective   Chief Complaint: SOB  HPI: Good urine output.  Blood pressure improved.  Approaching euvolemic status.  ROS:  All other ROS reviewed and negative. Pertinent positives noted in the HPI.     Inpatient Medications  Scheduled Meds:  carvedilol  3.125 mg Oral BID WC   digoxin  0.125 mg Oral Daily   empagliflozin  10 mg Oral Daily   furosemide  40 mg Intravenous BID   losartan  25 mg Oral Daily   potassium chloride  40 mEq Oral Once   rivaroxaban  20 mg Oral Q breakfast   sodium chloride flush  3 mL Intravenous Q12H   spironolactone  25 mg Oral Daily   Continuous Infusions:  sodium chloride     PRN Meds: sodium chloride, acetaminophen, ondansetron (ZOFRAN) IV, sodium chloride flush   Vital Signs   Vitals:   05/21/22 2013 05/21/22 2353 05/22/22 0445 05/22/22 0758  BP: 104/77 98/74 130/83 128/76  Pulse: (!) 103 89 100 94  Resp: '17 18 16 18  '$ Temp: 98.1 F (36.7 C) 98.1 F (36.7 C) 98 F (36.7 C) 97.9 F (36.6 C)  TempSrc: Oral Oral Oral Oral  SpO2: 99% 100% 100% 96%  Weight:   68 kg   Height:        Intake/Output Summary (Last 24 hours) at 05/22/2022 0855 Last data filed at 05/22/2022 0445 Gross per 24 hour  Intake 360 ml  Output 2600 ml  Net -2240 ml      05/22/2022    4:45 AM 05/21/2022    3:36 AM 05/20/2022    4:09 AM  Last 3 Weights  Weight (lbs) 149 lb 14.6 oz 150 lb 2.1 oz 149 lb 14.6 oz  Weight (kg) 68 kg 68.1 kg 68 kg      Telemetry  Overnight telemetry shows multifocal atrial tachycardia versus atrial fibs heart rate in 100s, which I personally reviewed.    Physical Exam   Vitals:   05/21/22 2013 05/21/22 2353 05/22/22 0445 05/22/22 0758  BP: 104/77 98/74 130/83 128/76  Pulse: (!) 103 89 100 94  Resp: '17 18 16 18  '$ Temp: 98.1 F (36.7 C) 98.1 F (36.7 C) 98 F (36.7  C) 97.9 F (36.6 C)  TempSrc: Oral Oral Oral Oral  SpO2: 99% 100% 100% 96%  Weight:   68 kg   Height:        Intake/Output Summary (Last 24 hours) at 05/22/2022 0855 Last data filed at 05/22/2022 0445 Gross per 24 hour  Intake 360 ml  Output 2600 ml  Net -2240 ml       05/22/2022    4:45 AM 05/21/2022    3:36 AM 05/20/2022    4:09 AM  Last 3 Weights  Weight (lbs) 149 lb 14.6 oz 150 lb 2.1 oz 149 lb 14.6 oz  Weight (kg) 68 kg 68.1 kg 68 kg    Body mass index is 20.91 kg/m.  General: Well nourished, well developed, in no acute distress Head: Atraumatic, normal size  Eyes: PEERLA, EOMI  Neck: Supple, JVD 7 to 10 cm of water Endocrine: No thryomegaly Cardiac: Normal S1, S2; irregular rhythm Lungs: Diminished breath sounds bilaterally Abd: Soft, nontender, no hepatomegaly  Ext: No edema, pulses 2+ Musculoskeletal: No deformities, BUE and BLE strength normal and equal Skin: Warm and dry, no rashes   Neuro: Alert  and oriented to person, place, time, and situation, CNII-XII grossly intact, no focal deficits  Psych: Normal mood and affect   Labs  High Sensitivity Troponin:   Recent Labs  Lab 05/18/22 0138 05/18/22 0424 05/19/22 0702  TROPONINIHS 9 24* 28*     Cardiac EnzymesNo results for input(s): "TROPONINI" in the last 168 hours. No results for input(s): "TROPIPOC" in the last 168 hours.  Chemistry Recent Labs  Lab 05/20/22 0105 05/21/22 0134 05/22/22 0150  NA 133* 136 136  K 3.8 3.3* 3.4*  CL 91* 89* 93*  CO2 34* 37* 35*  GLUCOSE 97 87 85  BUN '22 23 19  '$ CREATININE 1.19 1.08 1.09  CALCIUM 8.2* 8.3* 8.5*  PROT 5.6* 5.9* 6.2*  ALBUMIN 2.5* 2.4* 2.5*  AST 92* 79* 59*  ALT 136* 128* 114*  ALKPHOS 179* 170* 179*  BILITOT 1.5* 1.5* 1.3*  GFRNONAA >60 >60 >60  ANIONGAP '8 10 8    '$ Hematology Recent Labs  Lab 05/20/22 0105 05/21/22 0134 05/22/22 0150  WBC 5.1 5.8 5.8  RBC 4.11* 4.42 5.07  HGB 13.0 13.8 15.7  HCT 38.8* 41.7 46.6  MCV 94.4 94.3 91.9   MCH 31.6 31.2 31.0  MCHC 33.5 33.1 33.7  RDW 14.4 14.1 14.2  PLT 95* 99* 142*   BNP Recent Labs  Lab 05/20/22 0105 05/21/22 0134 05/22/22 0150  BNP 3,749.5* 2,363.0* 1,459.9*    DDimer No results for input(s): "DDIMER" in the last 168 hours.   Radiology  No results found.  Cardiac Studies  TTE 05/17/2022  1. Severe global reduction in LV function with large apical thrombus;  Compared to 09/27/19 LV function worse and apical thrombus new; pt sent to  ER by Dr Curt Bears.   2. Left ventricular ejection fraction, by estimation, is <20%. The left  ventricle has severely decreased function. The left ventricle demonstrates  global hypokinesis. The left ventricular internal cavity size was severely  dilated. Left ventricular  diastolic parameters are consistent with Grade III diastolic dysfunction  (restrictive).   3. Right ventricular systolic function is severely reduced. The right  ventricular size is moderately enlarged.   4. Left atrial size was severely dilated.   5. Right atrial size was severely dilated.   6. A small pericardial effusion is present.   7. The mitral valve is normal in structure. Mild mitral valve  regurgitation. No evidence of mitral stenosis.   8. The aortic valve is tricuspid. Aortic valve regurgitation is mild.  Aortic valve sclerosis is present, with no evidence of aortic valve  stenosis.   9. The inferior vena cava is dilated in size with <50% respiratory  variability, suggesting right atrial pressure of 15 mmHg.   Patient Profile  Gregory Perry is a 71 y.o. male with systolic heart failure, alcohol and tobacco abuse, stroke, paroxysmal atrial fibrillation, medication noncompliance admitted on 05/17/2022 for acute on chronic systolic heart failure and new left ventricular thrombus.   Assessment & Plan   # Acute on chronic systolic heart failure, EF less than 20% # Medication noncompliance # Alcohol abuse -Here with severely reduced LV function.   Not a candidate for advanced therapies.  All we can offer him is medical therapy. -He is improving.  Would recommend 1 more day of IV Lasix 40 mg IV twice daily.  Give a one-time dose of acetazolamide given rising serum bicarbonate value.  Kidney function stable. -Tolerating digoxin 0.125 mg daily.  Digoxin level acceptable. -On losartan 25 mg daily.  On  Aldactone 25 mg daily.  I do not believe he will tolerate Entresto. -His BP is much improved.  I think we can get a beta-blocker on.  Start carvedilol 3.125 mg twice daily. -He is on Jardiance 10 mg daily. -Anticipate transition to oral diuretic therapy tomorrow.  # LV thrombus -Xarelto.  # Acute liver failure -Likely more due to hepatic congestion.  Improving.  # Paroxysmal atrial fibrillation -No signs of A-fib.  Appears to have multifocal atrial tachycardia.  Could be having brief episodes of A-fib. -Start amiodarone 200 mg twice daily for 7 days.  He will then do 200 mg daily. -On Xarelto  # Multifocal atrial tachycardia -Dig level is stable.  We will get him on a beta-blocker today.  Some of his episodes may be brief A-fib.  Starting amiodarone as above.    For questions or updates, please contact [place] Please consult [email or web address] for contact info under   Signed, [place] T. Audie Box, MD, [place]  05/22/2022 8:55 AM

## 2022-05-22 NOTE — Progress Notes (Addendum)
This chaplain responded to PMT PA-Josseline's consult for creating/updating the Pt. HCPOA. The Pt. is not creating a Living Will today.  The Pt. sister-Henrietta is at the bedside during Pt. AD education. The Pt. brother arrives at the end of the visit. The Pt. asked clarifying questions which the chaplain answered to the best of her knowledge. The Pt. chose Gregory Perry as his health care agent.  The chaplain is present with the Pt., notary, witnesses, and two family members for the notarizing of the Pt. HCPOA.  The chaplain gave the original AD to the Pt. along with one copy. The chaplain scanned the Pt. AD into his EMR.  **1526 Pt. and family gave their permission for Pt. HCPOA to be moved to the top of Pt. Patient Contacts. The chaplain updated the Pt. RN-Jen.  This chaplain is available for F/U spiritual care as needed.  Chaplain Sallyanne Kuster [phone]

## 2022-05-22 NOTE — Progress Notes (Signed)
   Medical records reviewed including progress notes and labs.  Patient continues to improve today. Appreciate TOC assistance with coordinating referral to hospice at the [facility] for hospice services at home.  Appreciate PMT chaplain Sallyanne Kuster assisting with notarization of HCPOA document.    Goals of care are clear at this time.  PMT will continue to follow peripherally.  Patient and family have been encouraged to reach out with any further needs.  Thank you for your referral and allowing PMT to assist in Mr. Gregory Perry's care.   Dorthy Cooler, [facility] Palliative Medicine Team  Team Phone # [phone]   NO CHARGE

## 2022-05-22 NOTE — Progress Notes (Signed)
  Progress Note   Patient: Gregory Perry PET:[id number] DOB: 21-Nov-1950 DOA: 05/17/2022     4 DOS: the patient was seen and examined on 05/22/2022 at 10:07 AM      Brief hospital course: [name] is a 71 y.o. M with HTN, CVA, pAF on Eliquis, hx CAD who presented with from Cardiology clinic due to new reduced EF, mural thrombus and CHF.        Assessment and Plan: * Acute on chronic systolic CHF (congestive heart failure) (HCC) Net -2.3 L yesterday, 12 L on admission.  Creatinine stable. -Continue IV Lasix - Consult cardiology, appreciate recommendations - Continue carvedilol, digoxin, Jardiance, losartan, spironolactone -Daily BMP, strict ins and outs  LV (left ventricular) mural thrombus - Continue Xarelto  Pressure injury of skin    Thrombocytopenia (HCC) Platelets 140 K.  Transaminitis Due to congestion, improved  Paroxysmal atrial fibrillation (HCC) - Continue amiodarone, carvedilol, digoxin, Xarelto  Cardiomyopathy, ischemic See above  Essential hypertension Blood pressure soft - Continue carvedilol, Jardiance, Lasix, losartan, spironolactone          Subjective: Patient is feeling better, swelling is resolved, he has no chest pain, dyspnea, abdominal pain.     Physical Exam: BP (!) 89/66 (BP Location: Left Arm)   Pulse 89   Temp 98.4 F (36.9 C) (Oral)   Resp 20   Ht '5\' 11"'$  (1.803 m)   Wt 68 kg   SpO2 100%   BMI 20.91 kg/m   Thin adult male, lying in bed, no acute distress Irregular, rate normal, no murmurs, no peripheral edema Respiratory rate normal, lungs clear without rales or wheezes Abdomen soft without tenderness palpation Attention normal, affect normal, judgment insight appear normal, face symmetric, speech fluent, moves all extremities with generalized weakness but symmetric strength   Data Reviewed: Discussed with cardiology Basic metabolic panel shows potassium 3.4, creatinine normal. CBC shows mild  thrombocytopenia LFTs no change BNP down to 1000    Family Communication: sister at the bedside    Disposition: Status is: Inpatient         Author: Edwin Dada, MD 05/22/2022 4:59 PM  For on call review [email or web address].

## 2022-05-22 NOTE — Progress Notes (Signed)
Heart Failure Nurse Navigator Progress Note   Went to bedside and spoke with patient and patents sister [name]) patient will be going home with some Hospice services and they would like to decline a appointment with HF TOC at this time. Stated they understood the education provided and have no further questions at this time.   Navigator will sign off at this time.   Earnestine Leys, BSN, Clinical cytogeneticist Only

## 2022-05-22 NOTE — Progress Notes (Signed)
Mobility Specialist Progress Note   05/22/22 1350  Mobility  Activity Ambulated with assistance in room;Ambulated with assistance to bathroom  Level of Assistance Minimal assist, patient does 75% or more  Assistive Device Front wheel walker  Distance Ambulated (ft) 30 ft  Range of Motion/Exercises Active;All extremities  Activity Response Tolerated well   Patient received in supine, initially reluctant but eventually agreed to participate with encouragement. Was independent for bed mobility and stood with supervision. Ambulated in room with min-mod A for safety  + VC for hand placement on RW and to navigate environment. Gait distance limited by urgency to have BM. Was then assisted to restroom. After BM he required mod A to stand from low surface of toilet and TA for pericare. Returned to bed without complaint or incident. Was left in supine with all needs met, call bell in reach.   Jordan Tripp, BS EXP Mobility Specialist Please contact via SecureChat or Rehab office at [phone]  

## 2022-05-22 NOTE — TOC Initial Note (Addendum)
Transition of Care (TOC) - Initial/Assessment Note  Marvetta Gibbons RN, BSN Transitions of Care Unit 4E- RN Case Manager See Treatment Team for direct phone #   Patient Details  Name: Gregory Perry MRN: [id number] Date of Birth: 01/11/1951  Transition of Care [facility]) CM/SW Contact:    Dawayne Patricia, RN Phone Number: 05/22/2022, 3:51 PM  Clinical Narrative:                 Noted referral for home hospice needs.  CM met with pt and family at bedside- sister Blain Pais and younger brother. List provided for Hospice choice Per CMS guidelines from [email or web address] website with star ratings (copy placed in shadow chart), after review pt/sister have selected [facility] for Hospice needs.   Discussed DME needs- pt states he would prefer to stay in his bed for now, has rollator and BSC at home- no other needs noted at this time.   Also discussed transportation home- per pt and family- they feel pt can transport via private care home. Pt ambulated from bed to door in room today with mobility tech and sister states pt has 2 steps on porch that they feel pt can navigate.   Address, phone # including primary contact for Freida Busman, as well as PCP all confirmed in epic.   Call made to [facility]- for home hospice referral- referral pending review and confirmation with tentative plan for tomorrow 12/28.   Expected Discharge Plan: Home w Hospice Care Barriers to Discharge: No Barriers Identified   Patient Goals and CMS Choice Patient states their goals for this hospitalization and ongoing recovery are:: return home w/ family and hospice CMS Medicare.gov Compare Post Acute Care list provided to:: Patient Choice offered to / list presented to : Patient, Sibling      Expected Discharge Plan and Services   Discharge Planning Services: CM Consult Post Acute Care Choice: Hospice Living arrangements for the past 2 months: Single Family Home                  DME Arranged: N/A DME Agency: NA         HH Agency: [place] Date [place]: 05/22/22 Time [place]: [id number] Representative spoke with at [place]: Webb Silversmith  Prior Living Arrangements/Services Living arrangements for the past 2 months: Single Family Home Lives with:: Siblings Patient language and need for interpreter reviewed:: Yes Do you feel safe going back to the place where you live?: Yes      Need for Family Participation in Patient Care: Yes (Comment) Care giver support system in place?: Yes (comment) Current home services: DME (BSC/rollator) Criminal Activity/Legal Involvement Pertinent to Current Situation/Hospitalization: No - Comment as needed  Activities of Daily Living      Permission Sought/Granted Permission sought to share information with : Facility Art therapist granted to share information with : Yes, Verbal Permission Granted     Permission granted to share info w AGENCY: Hospice        Emotional Assessment Appearance:: Appears stated age Attitude/Demeanor/Rapport: Engaged Affect (typically observed): Accepting, Appropriate, Pleasant Orientation: : Oriented to Self, Oriented to Place, Oriented to  Time, Oriented to Situation Alcohol / Substance Use: Not Applicable Psych Involvement: No (comment)  Admission diagnosis:  Chronic combined systolic and diastolic heart failure (HCC) [I50.42] Thrombocytopenia (HCC) [D69.6] Renal insufficiency [N28.9] Elevated liver function tests [R79.89] Mural thrombus of left ventricle [I51.3] Thrombus in heart chamber [  I51.3] Patient Active Problem List   Diagnosis Date Noted   Pressure injury of skin 05/22/2022   Transaminitis 05/18/2022   Acute on chronic systolic CHF (congestive heart failure) ([place]) 05/18/2022   LV (left ventricular) mural thrombus 05/18/2022   Thrombocytopenia ([place]) 05/18/2022   Anemia, iron deficiency 09/28/2019   Acute  blood loss anemia 09/28/2019   Acute ischemic right MCA stroke ([place]) s/p mechanical thrombectomy 09/26/2019   Malignant neoplasm of prostate ([place]) 01/19/2019   Decreased appetite 10/15/2018   Weight loss, unintentional 10/15/2018   Generalized joint pain 10/15/2018   Age-related cataract of both eyes 09/17/2018   Hyperlipidemia 08/05/2018   Elevated PSA 08/05/2018   Vision changes 08/05/2018   Encounter for vitamin deficiency screening 08/05/2018   Paroxysmal atrial fibrillation ([place]) 11/14/2017   Abnormal nuclear stress test 01/17/2016   Candida esophagitis (HCC)    Pre-operative cardiovascular examination, LVEF < 35% 11/20/2015   Cryptogenic stroke ([place]) 11/17/2015   Essential hypertension 11/17/2015   GERD (gastroesophageal reflux disease) 11/17/2015   Loss of weight 11/17/2015   Tobacco abuse 11/17/2015   Dysphagia 11/17/2015   Protein-calorie malnutrition, severe 11/17/2015   Cerebral infarction due to thrombosis of right posterior cerebral artery (HCC)    History of stroke    Cardiomyopathy, ischemic    Esophageal stricture    Dysphasia 05/07/2011   PCP:  Teena Dunk, NP Pharmacy:   [id number] [place] [place] [address]Phone: [phone]: [phone] [facility] [address]Phone: [phone]: [phone]    Social Determinants of Health (SDOH) Social History: [place] Medium Risk (04/26/2022)  Alcohol Screen: Low Risk  (05/14/2021)  Depression (PHQ2-9): Low Risk  (05/14/2021)  Tobacco Use: High Risk (05/17/2022)   SDOH Interventions:     Readmission Risk Interventions     No data to display

## 2022-05-23 ENCOUNTER — Other Ambulatory Visit (HOSPITAL_COMMUNITY): Payer: Self-pay

## 2022-05-23 LAB — BASIC METABOLIC PANEL
Anion gap: 11 (ref 5–15)
BUN: 26 mg/dL — ABNORMAL HIGH (ref 8–23)
CO2: 30 mmol/L (ref 22–32)
Calcium: 8.7 mg/dL — ABNORMAL LOW (ref 8.9–10.3)
Chloride: 93 mmol/L — ABNORMAL LOW (ref 98–111)
Creatinine, Ser: 1.26 mg/dL — ABNORMAL HIGH (ref 0.61–1.24)
GFR, Estimated: >60 mL/min (ref >60)
Glucose, Bld: 81 mg/dL (ref 70–99)
Potassium: 4.2 mmol/L (ref 3.5–5.1)
Sodium: 134 mmol/L — ABNORMAL LOW (ref 135–145)

## 2022-05-23 LAB — CBC
HCT: 46.1 % (ref 39.0–52.0)
Hemoglobin: 15.2 g/dL (ref 13.0–17.0)
MCH: 30.6 pg (ref 26.0–34.0)
MCHC: 33 g/dL (ref 30.0–36.0)
MCV: 92.9 fL (ref 80.0–100.0)
Platelets: 142 10*3/uL — ABNORMAL LOW (ref 150–400)
RBC: 4.96 MIL/uL (ref 4.22–5.81)
RDW: 14 % (ref 11.5–15.5)
WBC: 5.6 10*3/uL (ref 4.0–10.5)
nRBC: 0 % (ref 0.0–0.2)

## 2022-05-23 MED ORDER — DIGOXIN 125 MCG PO TABS
0.1250 mg | ORAL_TABLET | Freq: Every day | ORAL | 3 refills | Status: DC
  Filled 2022-05-23: qty 30, 30d supply, fill #0

## 2022-05-23 MED ORDER — TORSEMIDE 20 MG PO TABS
20.0000 mg | ORAL_TABLET | Freq: Every day | ORAL | Status: DC
  Filled 2022-05-23: qty 1

## 2022-05-23 MED ORDER — TORSEMIDE 20 MG PO TABS
20.0000 mg | ORAL_TABLET | Freq: Every day | ORAL | 3 refills | Status: DC
  Filled 2022-05-23: qty 30, 30d supply, fill #0

## 2022-05-23 MED ORDER — SPIRONOLACTONE 25 MG PO TABS
25.0000 mg | ORAL_TABLET | Freq: Every day | ORAL | 3 refills | Status: DC
  Filled 2022-05-23: qty 30, 30d supply, fill #0

## 2022-05-23 MED ORDER — RIVAROXABAN 20 MG PO TABS
20.0000 mg | ORAL_TABLET | Freq: Every day | ORAL | 3 refills | Status: DC
  Filled 2022-05-23: qty 30, 30d supply, fill #0

## 2022-05-23 MED ORDER — AMIODARONE HCL 200 MG PO TABS
ORAL_TABLET | ORAL | 0 refills | Status: DC
  Filled 2022-05-23: qty 43, 37d supply, fill #0

## 2022-05-23 MED ORDER — EMPAGLIFLOZIN 10 MG PO TABS
10.0000 mg | ORAL_TABLET | Freq: Every day | ORAL | 3 refills | Status: DC
  Filled 2022-05-23: qty 30, 30d supply, fill #0

## 2022-05-23 NOTE — Plan of Care (Signed)
  Problem: Acute Rehab OT Goals (only OT should resolve) Goal: Pt. Will Perform Grooming Outcome: Adequate for Discharge Goal: Pt. Will Perform Lower Body Dressing Outcome: Adequate for Discharge Goal: Pt. Will Transfer To Toilet Outcome: Adequate for Discharge Goal: OT Additional ADL Goal #1 Outcome: Adequate for Discharge

## 2022-05-23 NOTE — Progress Notes (Signed)
Explained discharge instructions to patient. Reviewed follow up appointment and next medication administration times. Also reviewed education. Patient verbalized having an understanding for instructions given. All belongings are in the patient's possession to include TOC meds. IV and telemetry were removed by patient's RN. No other needs verbalized. Transported downstairs for discharge.

## 2022-05-23 NOTE — Progress Notes (Signed)
Cardiology Progress Note  Patient ID: Gregory Perry MRN: [id number] DOB: 04-15-1951 Date of Encounter: 05/23/2022  Primary Cardiologist: Cristopher Peru, MD  Subjective   Chief Complaint: SOB  HPI: BP low this morning.  Euvolemic on examination.  Plans for discharge with hospice.  ROS:  All other ROS reviewed and negative. Pertinent positives noted in the HPI.     Inpatient Medications  Scheduled Meds:  amiodarone  200 mg Oral BID   Followed by   Derrill Memo ON 05/29/2022] amiodarone  200 mg Oral Daily   digoxin  0.125 mg Oral Daily   empagliflozin  10 mg Oral Daily   rivaroxaban  20 mg Oral Q breakfast   sodium chloride flush  3 mL Intravenous Q12H   spironolactone  25 mg Oral Daily   torsemide  20 mg Oral Daily   Continuous Infusions:  sodium chloride     PRN Meds: sodium chloride, acetaminophen, ondansetron (ZOFRAN) IV, sodium chloride flush   Vital Signs   Vitals:   05/22/22 2141 05/22/22 2335 05/23/22 0402 05/23/22 0811  BP: 91/65 (!) 90/58 106/79 (!) 89/62  Pulse:  89 93 97  Resp: 14 20 (!) 23 20  Temp:  98.6 F (37 C) 97.9 F (36.6 C) 98.9 F (37.2 C)  TempSrc:  Oral Oral Oral  SpO2: 98% 99% 98% 97%  Weight:   68.3 kg   Height:        Intake/Output Summary (Last 24 hours) at 05/23/2022 1033 Last data filed at 05/23/2022 0746 Gross per 24 hour  Intake 120 ml  Output 1600 ml  Net -1480 ml      05/23/2022    4:02 AM 05/22/2022    4:45 AM 05/21/2022    3:36 AM  Last 3 Weights  Weight (lbs) 150 lb 9.2 oz 149 lb 14.6 oz 150 lb 2.1 oz  Weight (kg) 68.3 kg 68 kg 68.1 kg      Telemetry  Overnight telemetry shows sinus rhythm with frequent PACs, which I personally reviewed.    Physical Exam   Vitals:   05/22/22 2141 05/22/22 2335 05/23/22 0402 05/23/22 0811  BP: 91/65 (!) 90/58 106/79 (!) 89/62  Pulse:  89 93 97  Resp: 14 20 (!) 23 20  Temp:  98.6 F (37 C) 97.9 F (36.6 C) 98.9 F (37.2 C)  TempSrc:  Oral Oral Oral  SpO2: 98% 99% 98% 97%   Weight:   68.3 kg   Height:        Intake/Output Summary (Last 24 hours) at 05/23/2022 1033 Last data filed at 05/23/2022 0746 Gross per 24 hour  Intake 120 ml  Output 1600 ml  Net -1480 ml       05/23/2022    4:02 AM 05/22/2022    4:45 AM 05/21/2022    3:36 AM  Last 3 Weights  Weight (lbs) 150 lb 9.2 oz 149 lb 14.6 oz 150 lb 2.1 oz  Weight (kg) 68.3 kg 68 kg 68.1 kg    Body mass index is 21 kg/m.  General: Well nourished, well developed, in no acute distress Head: Atraumatic, normal size  Eyes: PEERLA, EOMI  Neck: Supple, no JVD Endocrine: No thryomegaly Cardiac: Normal S1, S2; irregular rhythm, no murmurs Lungs: Clear to auscultation bilaterally, no wheezing, rhonchi or rales  Abd: Soft, nontender, no hepatomegaly  Ext: No edema, pulses 2+ Musculoskeletal: No deformities, BUE and BLE strength normal and equal Skin: Warm and dry, no rashes   Neuro: Alert and oriented to person,  place, time, and situation, CNII-XII grossly intact, no focal deficits  Psych: Normal mood and affect   Labs  High Sensitivity Troponin:   Recent Labs  Lab 05/18/22 0138 05/18/22 0424 05/19/22 0702  TROPONINIHS 9 24* 28*     Cardiac EnzymesNo results for input(s): "TROPONINI" in the last 168 hours. No results for input(s): "TROPIPOC" in the last 168 hours.  Chemistry Recent Labs  Lab 05/20/22 0105 05/21/22 0134 05/22/22 0150 05/23/22 0142  NA 133* 136 136 134*  K 3.8 3.3* 3.4* 4.2  CL 91* 89* 93* 93*  CO2 34* 37* 35* 30  GLUCOSE 97 87 85 81  BUN '22 23 19 '$ 26*  CREATININE 1.19 1.08 1.09 1.26*  CALCIUM 8.2* 8.3* 8.5* 8.7*  PROT 5.6* 5.9* 6.2*  --   ALBUMIN 2.5* 2.4* 2.5*  --   AST 92* 79* 59*  --   ALT 136* 128* 114*  --   ALKPHOS 179* 170* 179*  --   BILITOT 1.5* 1.5* 1.3*  --   GFRNONAA >60 >60 >60 >60  ANIONGAP '8 10 8 11    '$ Hematology Recent Labs  Lab 05/21/22 0134 05/22/22 0150 05/23/22 0142  WBC 5.8 5.8 5.6  RBC 4.42 5.07 4.96  HGB 13.8 15.7 15.2  HCT 41.7 46.6  46.1  MCV 94.3 91.9 92.9  MCH 31.2 31.0 30.6  MCHC 33.1 33.7 33.0  RDW 14.1 14.2 14.0  PLT 99* 142* 142*   BNP Recent Labs  Lab 05/20/22 0105 05/21/22 0134 05/22/22 0150  BNP 3,749.5* 2,363.0* 1,459.9*    DDimer No results for input(s): "DDIMER" in the last 168 hours.   Radiology  No results found.  Cardiac Studies  TTE 05/17/2022  1. Severe global reduction in LV function with large apical thrombus;  Compared to 09/27/19 LV function worse and apical thrombus new; pt sent to  ER by Dr Curt Bears.   2. Left ventricular ejection fraction, by estimation, is <20%. The left  ventricle has severely decreased function. The left ventricle demonstrates  global hypokinesis. The left ventricular internal cavity size was severely  dilated. Left ventricular  diastolic parameters are consistent with Grade III diastolic dysfunction  (restrictive).   3. Right ventricular systolic function is severely reduced. The right  ventricular size is moderately enlarged.   4. Left atrial size was severely dilated.   5. Right atrial size was severely dilated.   6. A small pericardial effusion is present.   7. The mitral valve is normal in structure. Mild mitral valve  regurgitation. No evidence of mitral stenosis.   8. The aortic valve is tricuspid. Aortic valve regurgitation is mild.  Aortic valve sclerosis is present, with no evidence of aortic valve  stenosis.   9. The inferior vena cava is dilated in size with <50% respiratory  variability, suggesting right atrial pressure of 15 mmHg.   Patient Profile  JEISON DELPILAR is a 71 y.o. male with systolic heart failure, alcohol and tobacco abuse, stroke, paroxysmal atrial fibrillation, medication noncompliance admitted on 05/17/2022 for acute on chronic systolic heart failure and new left ventricular thrombus.    Assessment & Plan   # Acute systolic heart failure, EF less than 20% #Medication noncompliance #Alcohol abuse -Likely has an end-stage  cardiomyopathy.  Not a candidate for advanced therapies.  All we can offer is medical therapy. -Blood pressure low.  Stop carvedilol and losartan. -Can transition to torsemide 20 mg daily p.o.  He is euvolemic. -Okay to continue digoxin 0.125 mg daily.  Continue Aldactone 25 mg daily. -Continue Jardiance 10 mg daily. -Plan is discharged with hospice.  His cardiomyopathy is likely end-stage.  His medications can be stopped by the hospice unit as they see fit.  # LV thrombus -Xarelto  # Acute liver failure -Improving.  Secondary to hepatic congestion.  # Paroxysmal atrial fibrillation -No signs of fib.  Having multifocal atrial tachycardia.  Continue amiodarone 200 mg twice daily for 7 days.  Can then continue 200 mg daily to maintain sinus rhythm. -On Xarelto.  # Multifocal atrial tachycardia -Stopping beta-blocker due to hypotension.  Continue amiodarone as above.  [place] [place] will sign off.   Medication Recommendations: Continue medications as above.  Plan to transition to hospice. Other recommendations (labs, testing, etc): None. Follow up as an outpatient: None needed.  The patient is going home with hospice care.  For questions or updates, please contact [place] Please consult [email or web address] for contact info under        Signed, [place] T. Audie Box, MD, [place]  05/23/2022 10:33 AM

## 2022-05-23 NOTE — Progress Notes (Signed)
Mobility Specialist Progress Note   05/23/22 0935  Mobility  Activity Ambulated with assistance in hallway  Level of Assistance Minimal assist, patient does 75% or more  Assistive Device Front wheel walker  Distance Ambulated (ft) 50 ft  Range of Motion/Exercises Active;All extremities  Activity Response Tolerated well   Patient received in supine and agreeable to participate with encouragement. Was independent for bed mobility and required minimal HHA to stand. Ambulated with min-mod A + cues for safety secondary LOB and LE buckling x1.  Returned to room without complaint or incident. Was left in supine with all needs met, call bell in reach.   [name] Keandra Medero, BS EXP Mobility Specialist Please contact via SecureChat or Rehab office at [phone]

## 2022-05-23 NOTE — Care Management Important Message (Signed)
Important Message  Patient Details  Name: Gregory Perry MRN: [id number] Date of Birth: [birth year]   Medicare Important Message Given:  Yes     Shelda Altes 05/23/2022, 11:40 AM

## 2022-05-23 NOTE — TOC Transition Note (Signed)
Transition of Care (TOC) - CM/SW Discharge Note Marvetta Gibbons RN, BSN Transitions of Care Unit 4E- RN Case Manager See Treatment Team for direct phone #   Patient Details  Name: Gregory Perry MRN: [id number] Date of Birth: 1950/09/08  Transition of Care [facility] CM/SW Contact:  Dawayne Patricia, RN Phone Number: 05/23/2022, 10:46 AM   Clinical Narrative:    Pt stable for transition home today. CM has confirmed Hospice services with [facility] per Cheri they will plan to see pt later today in the home.   Family to transport home-   No further TOC needs noted.    Final next level of care: Home w Hospice Care Barriers to Discharge: No Barriers Identified   Patient Goals and CMS Choice CMS Medicare.gov Compare Post Acute Care list provided to:: Patient Choice offered to / list presented to : Patient, Sibling  Discharge Placement                 Home w/ Hospice        Discharge Plan and Services Additional resources added to the After Visit Summary for     Discharge Planning Services: CM Consult Post Acute Care Choice: Hospice          DME Arranged: N/A DME Agency: NA         HH Agency: [place] Date [place]: 05/22/22 Time [place]: [id number] Representative spoke with at [place]: [place] Determinants of Health ([place]) Interventions [place]: Medium Risk (04/26/2022)  Alcohol Screen: Low Risk  (05/14/2021)  Depression (PHQ2-9): Low Risk  (05/14/2021)  Tobacco Use: High Risk (05/17/2022)     Readmission Risk Interventions    05/23/2022   10:46 AM  Readmission Risk Prevention Plan  Transportation Screening Complete  PCP or Specialist Appt within 5-7 Days Not Complete  Not Complete comments going home w/ [place] Screening Complete  Medication Review (RN CM) Complete

## 2022-05-23 NOTE — Discharge Summary (Signed)
Physician Discharge Summary   Patient: Gregory Perry MRN: [id number] DOB: February 24, 1951  Admit date:     05/17/2022  Discharge date: 05/23/22  Discharge Physician: Edwin Dada   PCP: Bo Merino I, NP     Recommendations at discharge:  Follow up with Hospice at discharge     Discharge Diagnoses: Principal Problem:   Acute on chronic systolic CHF (congestive heart failure) (HCC) Active Problems:   LV (left ventricular) mural thrombus   Essential hypertension   Cardiomyopathy, ischemic   Paroxysmal atrial fibrillation (HCC)   Transaminitis   Thrombocytopenia (HCC)   Pressure injury of skin      Hospital Course: Gregory Perry is a 71 y.o. M with HTN, CVA, pAF on Eliquis, hx CAD who presented with from Cardiology clinic due to new reduced EF, mural thrombus and CHF.      * Acute on chronic systolic CHF (congestive heart failure) ([place]) Admitted on Lasix, Cardiology consulted.  Diuresed 13 L on admission.    Hospice was recommended and the patient enrolled in hospice to follow at home.             The [facility] Controlled Substances Registry was reviewed for this patient prior to discharge.  Consultants: Cardiology, Palliative Care   Disposition: Home Diet recommendation:  Discharge Diet Orders (From admission, onward)     Start     Ordered   05/23/22 0000  Diet - low sodium heart healthy        05/23/22 1051             DISCHARGE MEDICATION: Allergies as of 05/23/2022   No Known Allergies      Medication List     STOP taking these medications    atorvastatin 40 MG tablet Commonly known as: LIPITOR   Eliquis 5 MG Tabs tablet Generic drug: apixaban   furosemide 20 MG tablet Commonly known as: LASIX   losartan 25 MG tablet Commonly known as: COZAAR   metoprolol succinate 25 MG 24 hr tablet Commonly known as: Toprol XL       TAKE these medications    amiodarone 200 MG tablet Commonly known as: PACERONE Take 1  tablet ('200mg'$ ) by mouth twice daily for 6 more days, then on 05/30/22 reduce to 1 tablet (200 mg) once daily Start taking on: May 29, 2022   Blood Pressure Cuff Misc Check blood pressure daily between 8 pm -9 pm   digoxin 0.125 MG tablet Commonly known as: LANOXIN Take 1 tablet (0.125 mg total) by mouth daily. Start taking on: May 24, 2022   Jardiance 10 MG Tabs tablet Generic drug: empagliflozin Take 1 tablet (10 mg total) by mouth daily. Start taking on: May 24, 2022   spironolactone 25 MG tablet Commonly known as: ALDACTONE Take 1 tablet (25 mg total) by mouth daily. Start taking on: May 24, 2022   torsemide 20 MG tablet Commonly known as: DEMADEX Take 1 tablet (20 mg total) by mouth daily. Start taking on: May 24, 2022   Xarelto 20 MG Tabs tablet Generic drug: rivaroxaban Take 1 tablet (20 mg total) by mouth daily with breakfast. Start taking on: May 24, 2022        Follow-up Information     [facility] Follow up.   Why: Home Hospice referral made Contact information: [address] [place] [phone]                Discharge Instructions  Diet - low sodium heart healthy   Complete by: As directed    Discharge instructions   Complete by: As directed    **IMPORTANT DISCHARGE INSTRUCTIONS**   From Dr. Loleta Books: You were admitted for heart failure You were treated here with diuretics To protect the heart and keep it functioning as well as possible for as long as possible, we recommend the following: STOP atorvastatin, Eliquis, furosemide, losartan and metoprolol  Start amiodarone and digoxin in place of metoprolol to control heart rate and protect the heart  Start Xarelto in place of Eliquis for blood thinner  Start torsemide and spironolactone in place of furosemide to keep fluid off (diuretic) and protect the heart  Start Jardiance and Entresto in place of losartan  to block damage to the heart  Hospice may readjust some of those medicines, that is okay   Increase activity slowly   Complete by: As directed    No wound care   Complete by: As directed        Discharge Exam: Filed Weights   05/21/22 0336 05/22/22 0445 05/23/22 0402  Weight: 68.1 kg 68 kg 68.3 kg    General: Pt is alert, awake, not in acute distress, tired, lying on his side, does not engage much Cardiovascular: RRR, nl S1-S2, no murmurs appreciated.   No LE edema.   Respiratory: Normal respiratory rate and rhythm.  CTAB without rales or wheezes. Abdominal: Abdomen soft and non-tender.  No distension or HSM.   Neuro/Psych: Strength symmetric in upper and lower extremities.  Judgment and insight appear normal .   Condition at discharge: stable  The results of significant diagnostics from this hospitalization (including imaging, microbiology, ancillary and laboratory) are listed below for reference.   Imaging Studies: CT Head Wo Contrast  Result Date: 05/18/2022 CLINICAL DATA:  Fall from bed, on blood thinners. EXAM: CT HEAD WITHOUT CONTRAST TECHNIQUE: Contiguous axial images were obtained from the base of the skull through the vertex without intravenous contrast. RADIATION DOSE REDUCTION: This exam was performed according to the departmental dose-optimization program which includes automated exposure control, adjustment of the mA and/or kV according to patient size and/or use of iterative reconstruction technique. COMPARISON:  09/26/2019 brain MRI FINDINGS: Brain: No evidence of acute infarction, hemorrhage, hydrocephalus, extra-axial collection or mass lesion/mass effect. Chronic bilateral occipital (left more than right), posterior right frontal, and bilateral cerebellar infarcts. Vascular: No hyperdense vessel or unexpected calcification. Skull: Normal. Negative for fracture or focal lesion. Sinuses/Orbits: No visible injury IMPRESSION: 1. No evidence of intracranial injury. 2.  Multiple chronic infarcts including interval small right occipital cortex infarct since a 2021 comparison. Electronically Signed   By: Jorje Guild M.D.   On: 05/18/2022 04:24   DG Chest 2 View  Result Date: 05/17/2022 CLINICAL DATA:  SOB EXAM: CHEST - 2 VIEW COMPARISON:  04/17/22 FINDINGS: Cardiomegaly. No pleural effusion. No pneumothorax. No focal airspace opacity. Loop recorder device in place. Prominent interstitial opacities bilaterally, which could represent pulmonary venous congestion or atypical infection. No displaced rib fractures. Visualized upper abdomen is unremarkable. IMPRESSION: 1. Prominent interstitial opacities bilaterally, which could represent pulmonary venous congestion or atypical infection. 2. Cardiomegaly. Electronically Signed   By: Marin Roberts M.D.   On: 05/17/2022 14:35   ECHOCARDIOGRAM COMPLETE  Result Date: 05/17/2022    ECHOCARDIOGRAM REPORT   Patient Name:   [name] Date of Exam: 05/17/2022 Medical Rec #:  [id number]         Height:  71.0 in Accession #:    [id number]        Weight:       103.0 lb Date of Birth:  [birth year]          BSA:          1.590 m Patient Age:    50 years          BP:           138/80 mmHg Patient Gender: M                 HR:           97 bpm. Exam Location:  [place] Procedure: 2D Echo, Cardiac Doppler, Color Doppler and Intracardiac            Opacification Agent                      STAT ECHO Reported to: Dr. Curt Bears on 05/17/2022 12:30:00 PM. Indications:    Congestive Heart Failure I50.9  History:        Patient has prior history of Echocardiogram examinations, most                 recent 09/27/2019. CAD; Risk Factors:Hypertension.  Sonographer:    Mikki Santee RDCS Referring Phys: [id number] PETER M [name] IMPRESSIONS  1. Severe global reduction in LV function with large apical thrombus; Compared to 09/27/19 LV function worse and apical thrombus new; pt sent to ER by Dr Curt Bears.  2. Left ventricular ejection fraction, by  estimation, is <20%. The left ventricle has severely decreased function. The left ventricle demonstrates global hypokinesis. The left ventricular internal cavity size was severely dilated. Left ventricular diastolic parameters are consistent with Grade III diastolic dysfunction (restrictive).  3. Right ventricular systolic function is severely reduced. The right ventricular size is moderately enlarged.  4. Left atrial size was severely dilated.  5. Right atrial size was severely dilated.  6. A small pericardial effusion is present.  7. The mitral valve is normal in structure. Mild mitral valve regurgitation. No evidence of mitral stenosis.  8. The aortic valve is tricuspid. Aortic valve regurgitation is mild. Aortic valve sclerosis is present, with no evidence of aortic valve stenosis.  9. The inferior vena cava is dilated in size with <50% respiratory variability, suggesting right atrial pressure of 15 mmHg. FINDINGS  Left Ventricle: Left ventricular ejection fraction, by estimation, is <20%. The left ventricle has severely decreased function. The left ventricle demonstrates global hypokinesis. Definity contrast agent was given IV to delineate the left ventricular endocardial borders. The left ventricular internal cavity size was severely dilated. There is no left ventricular hypertrophy. Left ventricular diastolic parameters are consistent with Grade III diastolic dysfunction (restrictive). Right Ventricle: The right ventricular size is moderately enlarged. Right ventricular systolic function is severely reduced. Left Atrium: Left atrial size was severely dilated. Right Atrium: Right atrial size was severely dilated. Pericardium: A small pericardial effusion is present. Mitral Valve: The mitral valve is normal in structure. Mild mitral valve regurgitation. No evidence of mitral valve stenosis. Tricuspid Valve: The tricuspid valve is normal in structure. Tricuspid valve regurgitation is mild . No evidence of  tricuspid stenosis. Aortic Valve: The aortic valve is tricuspid. Aortic valve regurgitation is mild. Aortic valve sclerosis is present, with no evidence of aortic valve stenosis. Pulmonic Valve: The pulmonic valve was normal in structure. Pulmonic valve regurgitation is trivial. No evidence of pulmonic stenosis. Aorta: The aortic root is normal  in size and structure. Venous: The inferior vena cava is dilated in size with less than 50% respiratory variability, suggesting right atrial pressure of 15 mmHg. IAS/Shunts: No atrial level shunt detected by color flow Doppler. Additional Comments: Severe global reduction in LV function with large apical thrombus; Compared to 09/27/19 LV function worse and apical thrombus new; pt sent to ER by Dr Curt Bears.  LEFT VENTRICLE PLAX 2D LVIDd:         6.60 cm LVIDs:         6.40 cm LV PW:         1.10 cm LV IVS:        1.00 cm LVOT diam:     2.20 cm LV SV:         27 LV SV Index:   17 LVOT Area:     3.80 cm  RIGHT VENTRICLE RV Basal diam:  5.10 cm RV Mid diam:    3.50 cm RV S prime:     3.98 cm/s TAPSE (M-mode): 1.0 cm LEFT ATRIUM              Index        RIGHT ATRIUM           Index LA diam:        4.80 cm  3.02 cm/m   RA Area:     26.10 cm LA Vol (A2C):   102.0 ml 64.14 ml/m  RA Volume:   92.10 ml  57.91 ml/m LA Vol (A4C):   83.0 ml  52.19 ml/m LA Biplane Vol: 91.6 ml  57.60 ml/m  AORTIC VALVE LVOT Vmax:   49.40 cm/s LVOT Vmean:  28.450 cm/s LVOT VTI:    0.072 m  AORTA Ao Root diam: 3.40 cm TRICUSPID VALVE TR Peak grad:   32.9 mmHg TR Vmax:        287.00 cm/s  SHUNTS Systemic VTI:  0.07 m Systemic Diam: 2.20 cm Kirk Ruths MD Electronically signed by Kirk Ruths MD Signature Date/Time: 05/17/2022/12:54:05 PM    Final     Microbiology: Results for orders placed or performed during the hospital encounter of 09/25/19  Respiratory Panel by RT PCR (Flu A&B, Covid) - Nasopharyngeal Swab     Status: None   Collection Time: 09/26/19  1:46 AM   Specimen: Nasopharyngeal  Swab  Result Value Ref Range Status   SARS Coronavirus 2 by RT PCR NEGATIVE NEGATIVE Final    Comment: (NOTE) SARS-CoV-2 target nucleic acids are NOT DETECTED. The SARS-CoV-2 RNA is generally detectable in upper respiratoy specimens during the acute phase of infection. The lowest concentration of SARS-CoV-2 viral copies this assay can detect is 131 copies/mL. A negative result does not preclude SARS-Cov-2 infection and should not be used as the sole basis for treatment or other patient management decisions. A negative result may occur with  improper specimen collection/handling, submission of specimen other than nasopharyngeal swab, presence of viral mutation(s) within the areas targeted by this assay, and inadequate number of viral copies (<131 copies/mL). A negative result must be combined with clinical observations, patient history, and epidemiological information. The expected result is Negative. Fact Sheet for Patients:  PinkCheek.be Fact Sheet for Healthcare Providers:  GravelBags.it This test is not yet ap proved or cleared by the [place] FDA and  has been authorized for detection and/or diagnosis of SARS-CoV-2 by FDA under an Emergency Use Authorization (EUA). This EUA will remain  in effect (meaning this test can be used) for the duration  of the COVID-19 declaration under Section 564(b)(1) of the Act, 21 U.S.C. section 360bbb-3(b)(1), unless the authorization is terminated or revoked sooner.    Influenza A by PCR NEGATIVE NEGATIVE Final   Influenza B by PCR NEGATIVE NEGATIVE Final    Comment: (NOTE) The Xpert Xpress SARS-CoV-2/FLU/RSV assay is intended as an aid in  the diagnosis of influenza from Nasopharyngeal swab specimens and  should not be used as a sole basis for treatment. Nasal washings and  aspirates are unacceptable for Xpert Xpress SARS-CoV-2/FLU/RSV  testing. Fact Sheet for  Patients: PinkCheek.be Fact Sheet for Healthcare Providers: GravelBags.it This test is not yet approved or cleared by the [place] FDA and  has been authorized for detection and/or diagnosis of SARS-CoV-2 by  FDA under an Emergency Use Authorization (EUA). This EUA will remain  in effect (meaning this test can be used) for the duration of the  Covid-19 declaration under Section 564(b)(1) of the Act, 21  U.S.C. section 360bbb-3(b)(1), unless the authorization is  terminated or revoked. Performed at [facility], [place] [address] [place], [place] [address]   MRSA PCR Screening     Status: None   Collection Time: 09/26/19  1:26 PM   Specimen: Urine, Catheterized; Nasopharyngeal  Result Value Ref Range Status   MRSA by PCR NEGATIVE NEGATIVE Final    Comment:        The GeneXpert MRSA Assay (FDA approved for NASAL specimens only), is one component of a comprehensive MRSA colonization surveillance program. It is not intended to diagnose MRSA infection nor to guide or monitor treatment for MRSA infections. Performed at [facility], [place] [address], [place], [place] [address]     Labs: CBC: Recent Labs  Lab 05/17/22 1408 05/18/22 0530 05/20/22 0105 05/21/22 0134 05/22/22 0150 05/23/22 0142  WBC 5.3 6.2 5.1 5.8 5.8 5.6  NEUTROABS 3.8  --   --   --   --   --   HGB 15.2 14.8 13.0 13.8 15.7 15.2  HCT 46.8 43.5 38.8* 41.7 46.6 46.1  MCV 96.9 94.2 94.4 94.3 91.9 92.9  PLT 83* 88* 95* 99* 142* 469*   Basic Metabolic Panel: Recent Labs  Lab 05/19/22 0354 05/19/22 0702 05/20/22 0105 05/21/22 0134 05/22/22 0150 05/23/22 0142  NA 135 137 133* 136 136 134*  K 3.1* 3.3* 3.8 3.3* 3.4* 4.2  CL 94* 93* 91* 89* 93* 93*  CO2 31 28 34* 37* 35* 30  GLUCOSE 91 84 97 87 85 81  BUN 24* '23 22 23 19 '$ 26*  CREATININE 1.26* 1.28* 1.19 1.08 1.09 1.26*  CALCIUM 8.8* 8.8* 8.2* 8.3* 8.5* 8.7*  MG 1.4*  --   --   --    --   --    Liver Function Tests: Recent Labs  Lab 05/18/22 0233 05/19/22 0354 05/20/22 0105 05/21/22 0134 05/22/22 0150  AST 31 104* 92* 79* 59*  ALT 35 137* 136* 128* 114*  ALKPHOS 55 202* 179* 170* 179*  BILITOT 0.5 2.1* 1.5* 1.5* 1.3*  PROT <3.0* 6.1* 5.6* 5.9* 6.2*  ALBUMIN <1.5* 2.7* 2.5* 2.4* 2.5*   CBG: No results for input(s): "GLUCAP" in the last 168 hours.  Discharge time spent: approximately 25 minutes spent on discharge counseling, evaluation of patient on day of discharge, and coordination of discharge planning with nursing, social work, pharmacy and case management  Signed: Edwin Dada, MD [facility] 05/23/2022

## 2022-05-24 ENCOUNTER — Ambulatory Visit: Payer: Medicare Other | Admitting: Adult Health

## 2022-08-02 ENCOUNTER — Encounter: Payer: Self-pay | Admitting: Student

## 2022-08-02 ENCOUNTER — Ambulatory Visit: Attending: Student | Admitting: Student

## 2022-08-02 VITALS — BP 112/84 | HR 51 | Ht 71.0 in | Wt 132.8 lb

## 2022-08-02 DIAGNOSIS — I480 Paroxysmal atrial fibrillation: Secondary | ICD-10-CM | POA: Diagnosis not present

## 2022-08-02 DIAGNOSIS — I10 Essential (primary) hypertension: Secondary | ICD-10-CM | POA: Diagnosis not present

## 2022-08-02 DIAGNOSIS — I5022 Chronic systolic (congestive) heart failure: Secondary | ICD-10-CM | POA: Diagnosis not present

## 2022-08-02 NOTE — Progress Notes (Signed)
  Cardiology Office Note:   Date:  08/02/2022  ID:  Gregory Perry, DOB 1950/10/07, MRN [id number]  Primary Cardiologist: Cristopher Peru, MD Electrophysiologist: Cristopher Peru, MD   History of Present Illness:   Gregory Perry is a 72 y.o. male with h/o HTN, CVA, PAF on Eliquis and CAD seen today for post hospital follow up.    Admitted 12/22 - 05/23/22 with A/CHF. Noted to have newly reduced EF and LV mural thrombus. Started on amiodarone and digoxin for rate control; Changed from Eliquis to Xarelto. Diuresed 17 L of fluid off. Pt discharged in Hospice care  Since discharge from hospital the patient reports doing ok. Hospice following. Pt has very poor appetite, and has continued to lose weight. Denies CP, undue SOB, or syncope. Some early satiety noted. He cannot tell when he is in AF or not.   Review of systems complete and found to be negative unless listed in HPI.   Device History: Medtronic ILR implanted 10/28/2015 for cryptogenic stroke (EOS)  Studies Reviewed:    EKG is not ordered today. EKG from 05/20/2022 reviewed which showed NSR with PACs  Echo 04/2022 LVEF <20%, +LV thrombus  Risk Assessment/Calculations:    CHA2DS2-VASc Score = 5  {         Physical Exam:   VS:  BP 112/84   Pulse (!) 51   Ht 5\' 11"  (1.803 m)   Wt 132 lb 12.8 oz (60.2 kg)   SpO2 97%   BMI 18.52 kg/m    Wt Readings from Last 3 Encounters:  08/02/22 132 lb 12.8 oz (60.2 kg)  05/23/22 150 lb 9.2 oz (68.3 kg)  04/25/22 103 lb (46.7 kg)     GEN: Cachectic, chronically ill appearing.  NECK: No JVD; No carotid bruits CARDIAC: Regular rate and rhythm, no murmurs, rubs, gallops RESPIRATORY:  Clear to auscultation without rales, wheezing or rhonchi  ABDOMEN: Soft, non-tender, non-distended EXTREMITIES:  No edema; No deformity   ASSESSMENT AND PLAN:   End stage Chronic systolic CHF LV thrombus Volume status stable on exam. He is thin and cachectic appearing one exam. Continue current  medications.  It was not felt he would tolerate Entresto. Could potentially consider ARB pending labwork.  Hospice following. He has lost a significant amount of weight. He is not a candidate for advanced therapies or ICD given poor prognosis and co-morbidities.  PAF Continue Xarelto Regular on exam.  Amiodarone has been stopped, sister thinks by Hospice.   HTN Continue current medications. As above, not felt to be Entresto candidate     Follow up with EP APP in 3 months  Signed, Shirley Friar, PA-C

## 2022-08-02 NOTE — Patient Instructions (Signed)
Medication Instructions:  Your physician recommends that you continue on your current medications as directed. Please refer to the Current Medication list given to you today.  *If you need a refill on your cardiac medications before your next appointment, please call your pharmacy*   Lab Work: BMET and CBC today If you have labs (blood work) drawn today and your tests are completely normal, you will receive your results only by: [place] (if you have MyChart) OR A paper copy in the mail If you have any lab test that is abnormal or we need to change your treatment, we will call you to review the results.   Follow-Up: At [facility], you and your health needs are our priority.  As part of our continuing mission to provide you with exceptional heart care, we have created designated Provider Care Teams.  These Care Teams include your primary Cardiologist (physician) and Advanced Practice Providers (APPs -  Physician Assistants and Nurse Practitioners) who all work together to provide you with the care you need, when you need it.  We recommend signing up for the patient portal called "MyChart".  Sign up information is provided on this After Visit Summary.  MyChart is used to connect with patients for Virtual Visits (Telemedicine).  Patients are able to view lab/test results, encounter notes, upcoming appointments, etc.  Non-urgent messages can be sent to your provider as well.   To learn more about what you can do with MyChart, go to [email or web address].    Your next appointment:   3 month(s)  Provider:   Legrand Como "Oda Kilts, PA-C

## 2022-08-03 LAB — CBC
Hematocrit: 34.7 % — ABNORMAL LOW (ref 37.5–51.0)
Hemoglobin: 11.6 g/dL — ABNORMAL LOW (ref 13.0–17.7)
MCH: 30.4 pg (ref 26.6–33.0)
MCHC: 33.4 g/dL (ref 31.5–35.7)
MCV: 91 fL (ref 79–97)
Platelets: 272 10*3/uL (ref 150–450)
RBC: 3.81 x10E6/uL — ABNORMAL LOW (ref 4.14–5.80)
RDW: 15.5 % — ABNORMAL HIGH (ref 11.6–15.4)
WBC: 6.7 10*3/uL (ref 3.4–10.8)

## 2022-08-03 LAB — BASIC METABOLIC PANEL
BUN/Creatinine Ratio: 19 (ref 10–24)
BUN: 14 mg/dL (ref 8–27)
CO2: 21 mmol/L (ref 20–29)
Calcium: 9.7 mg/dL (ref 8.6–10.2)
Chloride: 102 mmol/L (ref 96–106)
Creatinine, Ser: 0.73 mg/dL — ABNORMAL LOW (ref 0.76–1.27)
Glucose: 129 mg/dL — ABNORMAL HIGH (ref 70–99)
Potassium: 4.1 mmol/L (ref 3.5–5.2)
Sodium: 141 mmol/L (ref 134–144)
eGFR: 98 mL/min/{1.73_m2} (ref >59)

## 2022-08-12 ENCOUNTER — Ambulatory Visit: Payer: Medicare Other | Admitting: Cardiology

## 2022-08-19 ENCOUNTER — Telehealth: Payer: Self-pay | Admitting: Internal Medicine

## 2022-08-19 NOTE — Telephone Encounter (Signed)
Clarise Cruz, from [place] called to say patient is longer under there care.

## 2022-08-20 NOTE — Telephone Encounter (Signed)
Thank you :)

## 2022-10-30 NOTE — Progress Notes (Deleted)
  Electrophysiology Office Note:   Date:  10/30/2022  ID:  Gregory Perry, DOB 1950-09-06, MRN [id number]  Primary Cardiologist: Lewayne Bunting, MD Electrophysiologist: Lewayne Bunting, MD   History of Present Illness:   Gregory Perry is a 72 y.o. male with h/o HTN, CVA, PAF on Eliquis and CAD  seen today for routine electrophysiology followup. Since last being seen in our clinic the patient reports doing ***.  he denies chest pain, palpitations, dyspnea, PND, orthopnea, nausea, vomiting, dizziness, syncope, edema, weight gain, or early satiety.   Review of systems complete and found to be negative unless listed in HPI.   Studies Reviewed:    EKG is ordered today. Personal review shows ***  ***  Risk Assessment/Calculations:   {Does this patient have ATRIAL FIBRILLATION?:[phone]} No BP recorded.  {Refresh Note OR Click here to enter BP  :1}***        Physical Exam:   VS:  There were no vitals taken for this visit.   Wt Readings from Last 3 Encounters:  08/02/22 132 lb 12.8 oz (60.2 kg)  05/23/22 150 lb 9.2 oz (68.3 kg)  04/25/22 103 lb (46.7 kg)     GEN: Well nourished, well developed in no acute distress NECK: No JVD; No carotid bruits CARDIAC: {EPRHYTHM:28826}, no murmurs, rubs, gallops RESPIRATORY:  Clear to auscultation without rales, wheezing or rhonchi  ABDOMEN: Soft, non-tender, non-distended EXTREMITIES:  No edema; No deformity   ASSESSMENT AND PLAN:    End stage Chronic systolic CHF LV thrombus Volume status *** on exam. He is thin and cachectic appearing one exam. Continue current medications.  It was not felt he would tolerate Entresto. Could potentially consider ARB pending labwork.  Hospice following. He has lost a significant amount of weight. He is not a candidate for advanced therapies or ICD given poor prognosis and co-morbidities.   PAF Continue Xarelto. Labs today.  EKG today shows *** Amiodarone has been stopped, sister thinks by Hospice.     HTN Stable on current regimen   {Click here to Review PMH, Prob List, Meds, Allergies, SHx, FHx  :1}   Follow up with {NGEXB:28413} {EPFOLLOW KG:40102}  Signed, Graciella Freer, PA-C

## 2022-11-04 ENCOUNTER — Encounter: Payer: Self-pay | Admitting: Student

## 2022-11-04 ENCOUNTER — Ambulatory Visit: Payer: Medicare HMO | Attending: Student | Admitting: Student

## 2022-11-29 ENCOUNTER — Other Ambulatory Visit: Payer: Self-pay

## 2022-11-29 MED ORDER — TORSEMIDE 20 MG PO TABS: 20.0000 mg | ORAL_TABLET | Freq: Every day | ORAL | 2 refills | Status: DC

## 2022-12-11 ENCOUNTER — Other Ambulatory Visit: Payer: Self-pay

## 2022-12-11 MED ORDER — RIVAROXABAN 20 MG PO TABS: 20.0000 mg | ORAL_TABLET | Freq: Every day | ORAL | 2 refills | Status: DC

## 2022-12-11 MED ORDER — DIGOXIN 125 MCG PO TABS: 0.1250 mg | ORAL_TABLET | Freq: Every day | ORAL | 2 refills | Status: DC

## 2022-12-11 MED ORDER — SPIRONOLACTONE 25 MG PO TABS: 25.0000 mg | ORAL_TABLET | Freq: Every day | ORAL | 2 refills | Status: DC

## 2022-12-25 ENCOUNTER — Other Ambulatory Visit: Payer: Self-pay

## 2022-12-25 MED ORDER — EMPAGLIFLOZIN 10 MG PO TABS: 10.0000 mg | ORAL_TABLET | Freq: Every day | ORAL | 2 refills | Status: DC

## 2022-12-25 NOTE — Telephone Encounter (Signed)
Pt's medication was sent to pt's pharmacy as requested. Confirmation received.  °

## 2023-01-05 ENCOUNTER — Other Ambulatory Visit: Payer: Self-pay | Admitting: Internal Medicine

## 2023-01-08 DIAGNOSIS — Z5982 Transportation insecurity: Secondary | ICD-10-CM | POA: Diagnosis not present

## 2023-01-08 DIAGNOSIS — Z950 Presence of cardiac pacemaker: Secondary | ICD-10-CM | POA: Diagnosis not present

## 2023-01-08 DIAGNOSIS — R64 Cachexia: Secondary | ICD-10-CM | POA: Diagnosis not present

## 2023-01-08 DIAGNOSIS — I4891 Unspecified atrial fibrillation: Secondary | ICD-10-CM | POA: Diagnosis not present

## 2023-01-08 DIAGNOSIS — I69319 Unspecified symptoms and signs involving cognitive functions following cerebral infarction: Secondary | ICD-10-CM | POA: Diagnosis not present

## 2023-01-08 DIAGNOSIS — Z7901 Long term (current) use of anticoagulants: Secondary | ICD-10-CM | POA: Diagnosis not present

## 2023-01-08 DIAGNOSIS — Z720 Tobacco use: Secondary | ICD-10-CM | POA: Diagnosis not present

## 2023-01-08 DIAGNOSIS — Z6820 Body mass index (BMI) 20.0-20.9, adult: Secondary | ICD-10-CM | POA: Diagnosis not present

## 2023-01-08 DIAGNOSIS — Z5986 Financial insecurity: Secondary | ICD-10-CM | POA: Diagnosis not present

## 2023-01-08 DIAGNOSIS — I10 Essential (primary) hypertension: Secondary | ICD-10-CM | POA: Diagnosis not present

## 2023-01-08 DIAGNOSIS — D6869 Other thrombophilia: Secondary | ICD-10-CM | POA: Diagnosis not present

## 2023-01-08 DIAGNOSIS — Z7984 Long term (current) use of oral hypoglycemic drugs: Secondary | ICD-10-CM | POA: Diagnosis not present

## 2023-01-12 NOTE — Progress Notes (Deleted)
   Electrophysiology Office Note:   Date:  01/12/2023  ID:  BRADIE IAQUINTO, DOB 1950/12/04, MRN [id number]  Primary Cardiologist: Lewayne Bunting, MD Electrophysiologist: Lewayne Bunting, MD  {Click to update primary MD,subspecialty MD or APP then REFRESH:1}    History of Present Illness:   Gregory Perry is a 72 y.o. male with h/o HTN, CVA, PAF on Eliquis and CAD  seen today for routine electrophysiology followup.  Since last being seen in our clinic the patient reports doing ***.  he denies chest pain, palpitations, dyspnea, PND, orthopnea, nausea, vomiting, dizziness, syncope, edema, weight gain, or early satiety.   Review of systems complete and found to be negative unless listed in HPI.   Device History: [employer] ILR implanted 10/28/2015 for cryptogenic stroke (EOS)   Studies Reviewed:    EKG is ordered today. Personal review as below.       Echo 04/2022 LVEF <20%, +LV thrombus   Risk Assessment/Calculations:   {Does this patient have ATRIAL FIBRILLATION?:[phone]} No BP recorded.  {Refresh Note OR Click here to enter BP  :1}***        Physical Exam:   VS:  There were no vitals taken for this visit.   Wt Readings from Last 3 Encounters:  08/02/22 132 lb 12.8 oz (60.2 kg)  05/23/22 150 lb 9.2 oz (68.3 kg)  04/25/22 103 lb (46.7 kg)     GEN: Well nourished, well developed in no acute distress NECK: No JVD; No carotid bruits CARDIAC: {EPRHYTHM:28826}, no murmurs, rubs, gallops RESPIRATORY:  Clear to auscultation without rales, wheezing or rhonchi  ABDOMEN: Soft, non-tender, non-distended EXTREMITIES:  No edema; No deformity   ILR Interrogation- reviewed in detail today,  See PACEART report  ASSESSMENT AND PLAN:    End stage chronic systolic CHF LV thrombus Volume status *** *** on exam Intolerant of up titration of GDMT and has been followed by Hospice  PAF Continue Xarelto.  Can restart amiodarone if needed  HTN Stable on current regimen   {Click here to  Review PMH, Prob List, Meds, Allergies, SHx, FHx  :1}   Follow up with {NWGNF:62130} {EPFOLLOW QM:57846}  Signed, Graciella Freer, PA-C

## 2023-01-13 ENCOUNTER — Ambulatory Visit: Payer: Medicare HMO | Attending: Student | Admitting: Student

## 2023-01-13 ENCOUNTER — Encounter: Payer: Self-pay | Admitting: Student

## 2023-01-13 DIAGNOSIS — I10 Essential (primary) hypertension: Secondary | ICD-10-CM

## 2023-01-13 DIAGNOSIS — I480 Paroxysmal atrial fibrillation: Secondary | ICD-10-CM

## 2023-01-13 DIAGNOSIS — I5022 Chronic systolic (congestive) heart failure: Secondary | ICD-10-CM

## 2023-02-14 ENCOUNTER — Other Ambulatory Visit: Payer: Self-pay | Admitting: Cardiology

## 2023-02-22 ENCOUNTER — Other Ambulatory Visit: Payer: Self-pay | Admitting: Cardiology

## 2023-03-09 ENCOUNTER — Other Ambulatory Visit: Payer: Self-pay | Admitting: Internal Medicine

## 2023-03-10 NOTE — Telephone Encounter (Signed)
Prescription refill request for Xarelto received.  Indication:afib Last office visit:3/24 Weight:60.2  kg Age:72 Scr:0.73  3/24 CrCl:80.18  ml/min  Prescription refilled

## 2023-03-28 DEATH — deceased
